# Patient Record
Sex: Female | Born: 1975 | Race: White | Hispanic: No
Health system: Southern US, Academic
[De-identification: ages and names within clinical notes are randomized; demographics above are authoritative.]

## PROBLEM LIST (undated history)

## (undated) ENCOUNTER — Ambulatory Visit: Payer: BLUE CROSS/BLUE SHIELD

## (undated) ENCOUNTER — Ambulatory Visit: Payer: Medicaid (Managed Care)

## (undated) ENCOUNTER — Encounter

## (undated) ENCOUNTER — Encounter
Attending: Student in an Organized Health Care Education/Training Program | Primary: Student in an Organized Health Care Education/Training Program

## (undated) ENCOUNTER — Encounter: Attending: Nurse Practitioner | Primary: Nurse Practitioner

## (undated) ENCOUNTER — Telehealth

## (undated) ENCOUNTER — Telehealth: Attending: Family | Primary: Family

## (undated) ENCOUNTER — Encounter
Attending: Rehabilitative and Restorative Service Providers" | Primary: Rehabilitative and Restorative Service Providers"

## (undated) ENCOUNTER — Ambulatory Visit

## (undated) ENCOUNTER — Ambulatory Visit: Payer: BLUE CROSS/BLUE SHIELD | Attending: Obstetrics & Gynecology | Primary: Obstetrics & Gynecology

## (undated) ENCOUNTER — Ambulatory Visit: Payer: BLUE CROSS/BLUE SHIELD | Attending: Physician Assistant | Primary: Physician Assistant

## (undated) ENCOUNTER — Other Ambulatory Visit

## (undated) ENCOUNTER — Inpatient Hospital Stay

## (undated) ENCOUNTER — Encounter
Payer: BLUE CROSS/BLUE SHIELD | Attending: Rehabilitative and Restorative Service Providers" | Primary: Rehabilitative and Restorative Service Providers"

## (undated) ENCOUNTER — Ambulatory Visit: Payer: BLUE CROSS/BLUE SHIELD | Attending: Nephrology | Primary: Nephrology

## (undated) ENCOUNTER — Non-Acute Institutional Stay: Payer: BLUE CROSS/BLUE SHIELD

## (undated) ENCOUNTER — Ambulatory Visit
Payer: Medicaid (Managed Care) | Attending: Rehabilitative and Restorative Service Providers" | Primary: Rehabilitative and Restorative Service Providers"

## (undated) ENCOUNTER — Telehealth
Attending: Student in an Organized Health Care Education/Training Program | Primary: Student in an Organized Health Care Education/Training Program

## (undated) ENCOUNTER — Ambulatory Visit: Attending: Nurse Practitioner | Primary: Nurse Practitioner

## (undated) ENCOUNTER — Ambulatory Visit: Payer: BLUE CROSS/BLUE SHIELD | Attending: Surgical | Primary: Surgical

## (undated) ENCOUNTER — Inpatient Hospital Stay: Payer: Medicaid (Managed Care)

## (undated) ENCOUNTER — Ambulatory Visit
Payer: BLUE CROSS/BLUE SHIELD | Attending: Student in an Organized Health Care Education/Training Program | Primary: Student in an Organized Health Care Education/Training Program

## (undated) ENCOUNTER — Ambulatory Visit: Payer: BLUE CROSS/BLUE SHIELD | Attending: Internal Medicine | Primary: Internal Medicine

## (undated) ENCOUNTER — Ambulatory Visit
Payer: Medicaid (Managed Care) | Attending: Student in an Organized Health Care Education/Training Program | Primary: Student in an Organized Health Care Education/Training Program

## (undated) ENCOUNTER — Ambulatory Visit
Payer: BLUE CROSS/BLUE SHIELD | Attending: Rehabilitative and Restorative Service Providers" | Primary: Rehabilitative and Restorative Service Providers"

## (undated) ENCOUNTER — Ambulatory Visit: Payer: BLUE CROSS/BLUE SHIELD | Attending: Clinical | Primary: Clinical

## (undated) ENCOUNTER — Telehealth: Attending: Nephrology | Primary: Nephrology

## (undated) ENCOUNTER — Encounter: Attending: Spinal Cord Injury Medicine | Primary: Spinal Cord Injury Medicine

## (undated) ENCOUNTER — Non-Acute Institutional Stay
Payer: BLUE CROSS/BLUE SHIELD | Attending: Rehabilitative and Restorative Service Providers" | Primary: Rehabilitative and Restorative Service Providers"

## (undated) ENCOUNTER — Ambulatory Visit: Payer: BLUE CROSS/BLUE SHIELD | Attending: Family | Primary: Family

## (undated) ENCOUNTER — Ambulatory Visit: Payer: Medicaid (Managed Care) | Attending: Ambulatory Care | Primary: Ambulatory Care

## (undated) ENCOUNTER — Ambulatory Visit: Payer: BLUE CROSS/BLUE SHIELD | Attending: Mental Health | Primary: Mental Health

## (undated) ENCOUNTER — Ambulatory Visit: Payer: BLUE CROSS/BLUE SHIELD | Attending: "Endocrinology | Primary: "Endocrinology

## (undated) DIAGNOSIS — M797 Fibromyalgia: Secondary | ICD-10-CM

## (undated) DIAGNOSIS — G629 Polyneuropathy, unspecified: Secondary | ICD-10-CM

## (undated) DIAGNOSIS — G4730 Sleep apnea, unspecified: Secondary | ICD-10-CM

## (undated) DIAGNOSIS — K2960 Other gastritis without bleeding: Secondary | ICD-10-CM

## (undated) DIAGNOSIS — E669 Obesity, unspecified: Secondary | ICD-10-CM

## (undated) DIAGNOSIS — E785 Hyperlipidemia, unspecified: Secondary | ICD-10-CM

## (undated) DIAGNOSIS — I10 Essential (primary) hypertension: Secondary | ICD-10-CM

## (undated) DIAGNOSIS — Q438 Other specified congenital malformations of intestine: Secondary | ICD-10-CM

## (undated) DIAGNOSIS — K2210 Ulcer of esophagus without bleeding: Secondary | ICD-10-CM

## (undated) DIAGNOSIS — F329 Major depressive disorder, single episode, unspecified: Secondary | ICD-10-CM

## (undated) DIAGNOSIS — K2270 Barrett's esophagus without dysplasia: Secondary | ICD-10-CM

## (undated) DIAGNOSIS — E119 Type 2 diabetes mellitus without complications: Secondary | ICD-10-CM

## (undated) DIAGNOSIS — L732 Hidradenitis suppurativa: Secondary | ICD-10-CM

## (undated) DIAGNOSIS — G2581 Restless legs syndrome: Secondary | ICD-10-CM

## (undated) DIAGNOSIS — K5792 Diverticulitis of intestine, part unspecified, without perforation or abscess without bleeding: Secondary | ICD-10-CM

## (undated) DIAGNOSIS — K319 Disease of stomach and duodenum, unspecified: Secondary | ICD-10-CM

## (undated) DIAGNOSIS — F32A Depression, unspecified: Secondary | ICD-10-CM

## (undated) HISTORY — DX: Hidradenitis suppurativa: L73.2

## (undated) HISTORY — PX: OTHER SURGICAL HISTORY: SHX169

## (undated) HISTORY — DX: Major depressive disorder, single episode, unspecified: F32.9

## (undated) HISTORY — DX: Ulcer of esophagus without bleeding: K22.10

## (undated) HISTORY — DX: Essential (primary) hypertension: I10

## (undated) HISTORY — PX: TONSILLECTOMY: SUR1361

## (undated) HISTORY — DX: Other gastritis without bleeding: K29.60

## (undated) HISTORY — DX: Other specified congenital malformations of intestine: Q43.8

## (undated) HISTORY — DX: Barrett's esophagus without dysplasia: K22.70

## (undated) HISTORY — DX: Obesity, unspecified: E66.9

## (undated) HISTORY — DX: Diverticulitis of intestine, part unspecified, without perforation or abscess without bleeding: K57.92

## (undated) HISTORY — DX: Depression, unspecified: F32.A

## (undated) HISTORY — DX: Disease of stomach and duodenum, unspecified: K31.9

## (undated) HISTORY — PX: COLONOSCOPY WITH ESOPHAGOGASTRODUODENOSCOPY (EGD): SHX5779

## (undated) HISTORY — DX: Polyneuropathy, unspecified: G62.9

## (undated) HISTORY — PX: COLONOSCOPY: SHX174

## (undated) HISTORY — DX: Hyperlipidemia, unspecified: E78.5

## (undated) HISTORY — DX: Type 2 diabetes mellitus without complications: E11.9

---

## 2012-12-02 ENCOUNTER — Emergency Department: Payer: Self-pay | Admitting: Emergency Medicine

## 2012-12-02 LAB — COMPREHENSIVE METABOLIC PANEL
Albumin: 3.6 g/dL (ref 3.4–5.0)
Anion Gap: 7 (ref 7–16)
Co2: 26 mmol/L (ref 21–32)
Creatinine: 1.33 mg/dL — ABNORMAL HIGH (ref 0.60–1.30)
EGFR (African American): 59 — ABNORMAL LOW
EGFR (Non-African Amer.): 51 — ABNORMAL LOW
Glucose: 275 mg/dL — ABNORMAL HIGH (ref 65–99)
Osmolality: 281 (ref 275–301)
SGPT (ALT): 55 U/L (ref 12–78)
Sodium: 133 mmol/L — ABNORMAL LOW (ref 136–145)

## 2012-12-02 LAB — CBC
HCT: 39.6 % (ref 35.0–47.0)
HGB: 14.1 g/dL (ref 12.0–16.0)
MCH: 31.4 pg (ref 26.0–34.0)
MCHC: 35.6 g/dL (ref 32.0–36.0)
MCV: 88 fL (ref 80–100)
Platelet: 310 10*3/uL (ref 150–440)
RBC: 4.5 10*6/uL (ref 3.80–5.20)
RDW: 13.5 % (ref 11.5–14.5)

## 2012-12-02 LAB — URINALYSIS, COMPLETE
Bacteria: NONE SEEN
Bilirubin,UR: NEGATIVE
Blood: NEGATIVE
Glucose,UR: >=500 mg/dL (ref 0–75)
Hyaline Cast: 3
Ph: 5 (ref 4.5–8.0)
Protein: NEGATIVE
RBC,UR: 4 /HPF (ref 0–5)
Squamous Epithelial: 4

## 2012-12-03 LAB — DRUG SCREEN, URINE
Amphetamines, Ur Screen: NEGATIVE (ref ?–1000)
Barbiturates, Ur Screen: NEGATIVE (ref ?–200)
Cocaine Metabolite,Ur ~~LOC~~: NEGATIVE (ref ?–300)
MDMA (Ecstasy)Ur Screen: NEGATIVE (ref ?–500)
Opiate, Ur Screen: NEGATIVE (ref ?–300)
Phencyclidine (PCP) Ur S: NEGATIVE (ref ?–25)
Tricyclic, Ur Screen: NEGATIVE (ref ?–1000)

## 2013-05-10 ENCOUNTER — Emergency Department: Payer: Self-pay | Admitting: Emergency Medicine

## 2014-06-03 ENCOUNTER — Ambulatory Visit: Payer: Self-pay | Admitting: Neurology

## 2014-06-29 ENCOUNTER — Ambulatory Visit: Payer: Self-pay | Admitting: Neurology

## 2014-08-05 ENCOUNTER — Emergency Department: Payer: Self-pay | Admitting: Emergency Medicine

## 2014-08-05 LAB — CBC WITH DIFFERENTIAL/PLATELET
BASOS ABS: 0.1 10*3/uL (ref 0.0–0.1)
Basophil %: 0.6 %
Eosinophil #: 0.6 10*3/uL (ref 0.0–0.7)
Eosinophil %: 7.1 %
HCT: 49.9 % — AB (ref 35.0–47.0)
HGB: 16.9 g/dL — ABNORMAL HIGH (ref 12.0–16.0)
Lymphocyte #: 1.9 10*3/uL (ref 1.0–3.6)
Lymphocyte %: 21.9 %
MCH: 30.3 pg (ref 26.0–34.0)
MCHC: 33.8 g/dL (ref 32.0–36.0)
MCV: 90 fL (ref 80–100)
MONO ABS: 0.7 x10 3/mm (ref 0.2–0.9)
Monocyte %: 7.5 %
NEUTROS ABS: 5.6 10*3/uL (ref 1.4–6.5)
Neutrophil %: 62.9 %
Platelet: 330 10*3/uL (ref 150–440)
RBC: 5.58 10*6/uL — ABNORMAL HIGH (ref 3.80–5.20)
RDW: 13.8 % (ref 11.5–14.5)
WBC: 8.9 10*3/uL (ref 3.6–11.0)

## 2014-08-05 LAB — LIPASE, BLOOD: Lipase: 180 U/L (ref 73–393)

## 2014-08-05 LAB — COMPREHENSIVE METABOLIC PANEL
ALBUMIN: 4 g/dL (ref 3.4–5.0)
AST: 14 U/L — AB (ref 15–37)
Alkaline Phosphatase: 76 U/L
Anion Gap: 10 (ref 7–16)
BUN: 19 mg/dL — AB (ref 7–18)
Bilirubin,Total: 0.4 mg/dL (ref 0.2–1.0)
CHLORIDE: 99 mmol/L (ref 98–107)
CREATININE: 0.93 mg/dL (ref 0.60–1.30)
Calcium, Total: 9 mg/dL (ref 8.5–10.1)
Co2: 26 mmol/L (ref 21–32)
EGFR (African American): >60
GLUCOSE: 261 mg/dL — AB (ref 65–99)
Osmolality: 281 (ref 275–301)
Potassium: 4.2 mmol/L (ref 3.5–5.1)
SGPT (ALT): 47 U/L
SODIUM: 135 mmol/L — AB (ref 136–145)
Total Protein: 8 g/dL (ref 6.4–8.2)

## 2014-08-05 LAB — TROPONIN I: Troponin-I: <0.02 ng/mL

## 2014-08-06 LAB — URINALYSIS, COMPLETE
BILIRUBIN, UR: NEGATIVE
BLOOD: NEGATIVE
Bacteria: NONE SEEN
Glucose,UR: >=500 mg/dL (ref 0–75)
Ketone: NEGATIVE
Leukocyte Esterase: NEGATIVE
Nitrite: NEGATIVE
Ph: 5 (ref 4.5–8.0)
Protein: NEGATIVE
RBC,UR: <1 /HPF (ref 0–5)
SPECIFIC GRAVITY: 1.04 (ref 1.003–1.030)
Squamous Epithelial: <1
WBC UR: 1 /HPF (ref 0–5)

## 2014-08-06 LAB — TROPONIN I

## 2014-08-18 ENCOUNTER — Ambulatory Visit: Payer: Self-pay | Admitting: Gastroenterology

## 2014-08-18 LAB — CLOSTRIDIUM DIFFICILE(ARMC)

## 2014-08-20 LAB — STOOL CULTURE

## 2014-08-25 ENCOUNTER — Ambulatory Visit: Payer: Self-pay | Admitting: Pain Medicine

## 2014-09-06 ENCOUNTER — Ambulatory Visit: Payer: Self-pay | Admitting: Gastroenterology

## 2014-09-14 ENCOUNTER — Ambulatory Visit: Payer: Self-pay | Admitting: Neurology

## 2014-10-05 ENCOUNTER — Ambulatory Visit
Admit: 2014-10-05 | Disposition: A | Discharge status: Home / Self Care | Payer: Self-pay | Attending: Neurology | Admitting: Neurology

## 2014-11-05 ENCOUNTER — Ambulatory Visit
Admit: 2014-11-05 | Disposition: A | Discharge status: Home / Self Care | Payer: Self-pay | Attending: Neurology | Admitting: Neurology

## 2014-11-29 LAB — SURGICAL PATHOLOGY

## 2015-01-05 ENCOUNTER — Telehealth: Payer: Self-pay | Admitting: Pain Medicine

## 2015-01-05 NOTE — Telephone Encounter (Signed)
Pt saw dr crisp in feb 2016, we referred to Dr [facility]as at [facility], pt saw Dr Azzie GlatterFras 01/05/15. Can we sch her for a follow up

## 2015-01-05 NOTE — Telephone Encounter (Signed)
Morrie SheldonAshley, I need to review the evaluation by Dr. Maryruth HancockAnn Marie Fras prior to scheduling patient for appointment area knees obtain the evaluation of Dr.Fras for me to review

## 2015-01-12 ENCOUNTER — Ambulatory Visit: Payer: 59 | Attending: Family Medicine | Admitting: Physical Therapy

## 2015-01-12 ENCOUNTER — Encounter: Payer: Self-pay | Admitting: Physical Therapy

## 2015-01-12 DIAGNOSIS — M6281 Muscle weakness (generalized): Secondary | ICD-10-CM | POA: Insufficient documentation

## 2015-01-12 DIAGNOSIS — R293 Abnormal posture: Secondary | ICD-10-CM | POA: Diagnosis not present

## 2015-01-12 DIAGNOSIS — R262 Difficulty in walking, not elsewhere classified: Secondary | ICD-10-CM

## 2015-01-12 DIAGNOSIS — M79671 Pain in right foot: Secondary | ICD-10-CM | POA: Insufficient documentation

## 2015-01-12 DIAGNOSIS — M25572 Pain in left ankle and joints of left foot: Secondary | ICD-10-CM | POA: Insufficient documentation

## 2015-01-12 DIAGNOSIS — M25562 Pain in left knee: Secondary | ICD-10-CM | POA: Diagnosis not present

## 2015-01-12 DIAGNOSIS — M25561 Pain in right knee: Secondary | ICD-10-CM | POA: Diagnosis not present

## 2015-01-12 DIAGNOSIS — M545 Low back pain, unspecified: Secondary | ICD-10-CM

## 2015-01-12 NOTE — Therapy (Signed)
[facility] [address] [place], [place], [address] Phone: [phone]   Fax:  [phone]  Physical Therapy Evaluation  Patient Details  Name: [name] MRN: [id number] Date of Birth: 06-07-1976 Referring Provider:  Sula Rumple, MD  Encounter Date: 01/12/2015      PT End of Session - 01/12/15 1313    Visit Number 1   Number of Visits 16   Date for PT Re-Evaluation 03/09/15   PT Start Time 1257   PT Stop Time 1401   PT Time Calculation (min) 64 min   Activity Tolerance Patient limited by pain   Behavior During Therapy [facility] for tasks assessed/performed      Past Medical History  Diagnosis Date  . Diabetes mellitus without complication   . Hyperlipidemia   . Obesity   . Hidradenitis suppurativa   . Hypertension   . Depression   . Neuropathy   . Diverticulitis   . Redundant colon   . Erosive esophagitis   . Bile-induced gastritis   . Gastropathy   . Barrett's esophagus     History reviewed. No pertinent past surgical history.  There were no vitals filed for this visit.  Visit Diagnosis:  Bilateral low back pain without sciatica  Pain in joint, ankle and foot, left  Pain in right foot  Knee pain, bilateral  Difficulty walking  Muscle weakness (generalized)  Abnormal posture      Subjective Assessment - 01/12/15 1306    Subjective Pt. reports 7.5/10 B foot pain, Pt. reports B knees ache (4/10).  Pt. c/o 6/10 back pain currently at rest in sitting and increase pain with standing/walking tasks.     How long can you sit comfortably? 30 minutes   How long can you stand comfortably? 5 min.   How long can you walk comfortably? <5 min.   Patient Stated Goals Increase mobility and decrease pain.     Currently in Pain? Yes   Pain Score 7    Pain Location Foot   Pain Orientation Left;Right   Multiple Pain Sites Yes   Pain Score 6   Pain Location Back   Pain Orientation Lower   Pain Type Chronic  pain   Pain Relieving Factors rest       LEFS: 15 out of 80  Modified Oswestry: 82% self-perceived bed bound  Pt. Ambulates with slow, antalgic gait pattern with lateral swaying and forward head/ rounded shoulder posture.  Pt. Reports numerous falls at home due to neuropathy/ LE pain.           PT Education - 01/13/15 0748    Education provided Yes   Education Details HEP/ discuss aquatic ex. and [employer]   Person(s) Educated Patient;Caregiver(s)   Methods Explanation;Demonstration;Handout   Comprehension Verbalized understanding;Returned demonstration;Verbal cues required             PT Long Term Goals - 01/13/15 1227    PT LONG TERM GOAL #1   Title Pt. I with gym based/ HEP to improve B UE/LE muscle strength to grossly 4+/5 MMT to improve overall mobility.     Baseline Generalized strength 4/5 MMT   Time 8   Period Weeks   Status New   PT LONG TERM GOAL #2   Title Pt. will increase LEFS to >40 out of 80 to improve pain-free mobility with ADL and daily tasks.     Baseline LEFS: 15 out of 80 on 01/12/15   PT LONG TERM  GOAL #3   Title Pt. will decrease Modified Oswestry to <60% to improve self-perceived disability.     Baseline 82% self-perceived bed bound on 01/12/15   Time 8   Period Weeks   Status New   PT LONG TERM GOAL #4   Title Pt. able to complete 30 minutes of aquatic exercise with no increase c/o pain to improve generalized strength/ endurance with daily tasks.    Time 8   Period Weeks   Status New   PT LONG TERM GOAL #5   Title Pt. will report <5/10 back/B LE pain at worst with daily ADL and walking around house.     Time 8   Period Weeks   Status New               Plan - 01/13/15 0749    Clinical Impression Statement Pt. is a 39 y/o female while chronic h/o fibromyalgia, back and LE pain/ neuropathy.  Pt. c/o >6/10 constant back/LE pain which worsens with increase activity/ position changes.  Pt. is morbidly obese at >350# but has lost  over 150# over past couple years.  B UE/LE AROM WFL and strength grossly 4/5 MMT.  Decrease sensation in B lower legs/ feet with light palpaiton.  Pain limited standing lumbar flexion/ extension (50% limited).  Significant tenderness with very gentle/ light palpaiton over B UT/ mid-thoracic and lumbar paraspinals.  Pt. Ambulates with slow, antalgic gait pattern with lateral swaying and forward head/ rounded shoulder posture.  Pt. Reports numerous falls at home due to neuropathy/ LE pain.  Pt. recently joined [employer] but has not attended yet and will benefit from skilled PT services for generalized strengthening/ conditioning with addition of aquatic ex. program.      Pt will benefit from skilled therapeutic intervention in order to improve on the following deficits Decreased endurance;Decreased skin integrity;Impaired sensation;Increased edema;Cardiopulmonary status limiting activity;Decreased activity tolerance;Decreased strength;Pain;Decreased balance;Decreased mobility;Difficulty walking;Improper body mechanics;Impaired perceived functional ability;Dizziness;Decreased range of motion;Decreased coordination;Decreased safety awareness;Impaired flexibility;Postural dysfunction   Rehab Potential Fair   PT Frequency 2x / week   PT Duration 8 weeks   PT Treatment/Interventions ADLs/Self Care Home Management;Neuromuscular re-education;Aquatic Therapy;Patient/family education;Gait training;Cryotherapy;Stair training;Functional mobility training;Manual techniques;Balance training;Therapeutic exercise;Therapeutic activities;Moist Heat   PT Next Visit Plan Issue land based/ aquatic ex. program.  Review and progress HEP.     PT Home Exercise Plan see handouts/  Pt. will participate with [employer]   Consulted and Agree with Plan of Care Patient         Problem List There are no active problems to display for this patient.  Cammie McgeeMichael C Develle Sievers, PT, DPT # [phone]   01/13/2015, 12:35 PM  [facility] [address] [place], [place], [id number] Phone: [phone]   Fax:  [phone]

## 2015-01-18 ENCOUNTER — Ambulatory Visit: Payer: 59 | Admitting: Physical Therapy

## 2015-01-18 DIAGNOSIS — M25572 Pain in left ankle and joints of left foot: Secondary | ICD-10-CM

## 2015-01-18 DIAGNOSIS — M545 Low back pain, unspecified: Secondary | ICD-10-CM

## 2015-01-18 DIAGNOSIS — R262 Difficulty in walking, not elsewhere classified: Secondary | ICD-10-CM

## 2015-01-18 DIAGNOSIS — R293 Abnormal posture: Secondary | ICD-10-CM

## 2015-01-18 DIAGNOSIS — M79671 Pain in right foot: Secondary | ICD-10-CM

## 2015-01-18 DIAGNOSIS — M25561 Pain in right knee: Secondary | ICD-10-CM

## 2015-01-18 DIAGNOSIS — M6281 Muscle weakness (generalized): Secondary | ICD-10-CM

## 2015-01-18 DIAGNOSIS — M25562 Pain in left knee: Secondary | ICD-10-CM

## 2015-01-19 NOTE — Therapy (Signed)
[facility] [address] [place], [place], [address] Phone: [phone]   Fax:  [phone]  Physical Therapy Treatment  Patient Details  Name: [name] MRN: [id number] Date of Birth: 02-19-1976 Referring Provider:  Sula Rumple, MD  Encounter Date: 01/18/2015      PT End of Session - 01/19/15 0749    Visit Number 2   Number of Visits 16   Date for PT Re-Evaluation 03/09/15   PT Start Time 1501   PT Stop Time 1555   PT Time Calculation (min) 54 min   Activity Tolerance Patient limited by pain;Patient limited by fatigue   Behavior During Therapy [facility] for tasks assessed/performed      Past Medical History  Diagnosis Date  . Diabetes mellitus without complication   . Hyperlipidemia   . Obesity   . Hidradenitis suppurativa   . Hypertension   . Depression   . Neuropathy   . Diverticulitis   . Redundant colon   . Erosive esophagitis   . Bile-induced gastritis   . Gastropathy   . Barrett's esophagus     No past surgical history on file.  There were no vitals filed for this visit.  Visit Diagnosis:  Bilateral low back pain without sciatica  Pain in joint, ankle and foot, left  Pain in right foot  Knee pain, bilateral  Difficulty walking  Muscle weakness (generalized)  Abnormal posture      Subjective Assessment - 01/19/15 0747    Subjective Pt. c/o 7-8/10 L ankle/ back pain currently at rest.  Pt. states L ankle feels swollen today and had difficulty putting shoe on.  Pt. reports being unable to feel B feet.  Pt. has attended [employer] 2x over past week to ride recumbent bike.     Patient is accompained by: Family member   How long can you sit comfortably? 30 minutes   How long can you stand comfortably? 5 min.   How long can you walk comfortably? <5 min.   Patient Stated Goals Increase mobility and decrease pain.     Currently in Pain? Yes   Pain Score 7    Pain Location Foot   Pain  Orientation Left   Pain Type Chronic pain   Multiple Pain Sites Yes   Pain Score 8   Pain Location Back   Pain Orientation Lower   Pain Type Chronic pain       OBJECTIVE:  There.ex.: Nustep L4 7 min. (warm-up/no charge)- B UE/LE (increase c/o L foot pain).  Standing posture correction: RTB scap. Retraction/ horizontal shoulder flexion 20x each with mirror feedback.  Standing hip flexion/ abd./ heel raises with no wt. 20x each (mirror feedback/ posture correction).  Standing partial lunges on 6" step in //-bars (light UE assist) 10x L/R.  Resisted gait 2BTB lateral walking 5x each (moderate fatigue).  Sit to stands 10x with min. To no UE assist and cuing for proper technique.  Reviewed HEP.      Pt response for medical necessity:  Moderate cuing for proper technique.  Pt. Easily fatigued and pain focused with standing resisted ex.            PT Long Term Goals - 01/13/15 1227    PT LONG TERM GOAL #1   Title Pt. I with gym based/ HEP to improve B UE/LE muscle strength to grossly 4+/5 MMT to improve overall mobility.     Baseline Generalized strength 4/5 MMT   Time  8   Period Weeks   Status New   PT LONG TERM GOAL #2   Title Pt. will increase LEFS to >40 out of 80 to improve pain-free mobility with ADL and daily tasks.     Baseline LEFS: 15 out of 80 on 01/12/15   PT LONG TERM GOAL #3   Title Pt. will decrease Modified Oswestry to <60% to improve self-perceived disability.     Baseline 82% self-perceived bed bound on 01/12/15   Time 8   Period Weeks   Status New   PT LONG TERM GOAL #4   Title Pt. able to complete 30 minutes of aquatic exercise with no increase c/o pain to improve generalized strength/ endurance with daily tasks.    Time 8   Period Weeks   Status New   PT LONG TERM GOAL #5   Title Pt. will report <5/10 back/B LE pain at worst with daily ADL and walking around house.     Time 8   Period Weeks   Status New               Plan - 01/19/15 0753     Clinical Impression Statement Pt. requires several short seated rest breaks and frequent cuing/ guidance during ther.ex. for proper technique and encouragement.  Pt. easily fatigued with standing tasks and pain focused with light resistance posture correction.    Pt will benefit from skilled therapeutic intervention in order to improve on the following deficits Decreased endurance;Decreased skin integrity;Impaired sensation;Increased edema;Cardiopulmonary status limiting activity;Decreased activity tolerance;Decreased strength;Pain;Decreased balance;Decreased mobility;Difficulty walking;Improper body mechanics;Impaired perceived functional ability;Dizziness;Decreased range of motion;Decreased coordination;Decreased safety awareness;Impaired flexibility;Postural dysfunction   Rehab Potential Fair   PT Frequency 2x / week   PT Duration 8 weeks   PT Treatment/Interventions ADLs/Self Care Home Management;Neuromuscular re-education;Aquatic Therapy;Patient/family education;Gait training;Cryotherapy;Stair training;Functional mobility training;Manual techniques;Balance training;Therapeutic exercise;Therapeutic activities;Moist Heat   PT Next Visit Plan Issue land based/ aquatic ex. program.  Review and progress HEP.     PT Home Exercise Plan see handouts/  Pt. will participate with [employer]   Consulted and Agree with Plan of Care Patient;Family member/caregiver   Family Member Consulted husband        Problem List There are no active problems to display for this patient.   Cammie Mcgee, PT, DPT # [phone]   01/19/2015, 7:55 AM  [facility] [address] [place], [place], [address] Phone: [phone]   Fax:  [phone]

## 2015-01-20 ENCOUNTER — Ambulatory Visit: Payer: 59 | Admitting: Physical Therapy

## 2015-01-20 DIAGNOSIS — M25561 Pain in right knee: Secondary | ICD-10-CM

## 2015-01-20 DIAGNOSIS — M25572 Pain in left ankle and joints of left foot: Secondary | ICD-10-CM

## 2015-01-20 DIAGNOSIS — M545 Low back pain, unspecified: Secondary | ICD-10-CM

## 2015-01-20 DIAGNOSIS — M6281 Muscle weakness (generalized): Secondary | ICD-10-CM

## 2015-01-20 DIAGNOSIS — M79671 Pain in right foot: Secondary | ICD-10-CM

## 2015-01-20 DIAGNOSIS — M25562 Pain in left knee: Secondary | ICD-10-CM

## 2015-01-20 DIAGNOSIS — R293 Abnormal posture: Secondary | ICD-10-CM

## 2015-01-20 DIAGNOSIS — R262 Difficulty in walking, not elsewhere classified: Secondary | ICD-10-CM

## 2015-01-21 NOTE — Therapy (Signed)
[place] [facility] [address] [place], [place], [address] Phone: [phone]   Fax:  [phone]  Physical Therapy Treatment  Patient Details  Name: [name] MRN: [id number] Date of Birth: 02/08/1976 Referring Provider:  Sula Rumple, MD  Encounter Date: 01/20/2015      PT End of Session - 01/21/15 1011    Visit Number 3   Number of Visits 16   Date for PT Re-Evaluation 03/09/15   PT Start Time 1524   PT Stop Time 1612   PT Time Calculation (min) 48 min   Activity Tolerance Patient limited by pain;Patient limited by fatigue   Behavior During Therapy [facility] for tasks assessed/performed      Past Medical History  Diagnosis Date  . Diabetes mellitus without complication   . Hyperlipidemia   . Obesity   . Hidradenitis suppurativa   . Hypertension   . Depression   . Neuropathy   . Diverticulitis   . Redundant colon   . Erosive esophagitis   . Bile-induced gastritis   . Gastropathy   . Barrett's esophagus     No past surgical history on file.  There were no vitals filed for this visit.  Visit Diagnosis:  Bilateral low back pain without sciatica  Pain in joint, ankle and foot, left  Pain in right foot  Knee pain, bilateral  Difficulty walking  Abnormal posture  Muscle weakness (generalized)      Subjective Assessment - 01/21/15 1006    Subjective Pt. states she is having a really difficult day.  Pt. c/o significant pain in back/B LE.  Pt. states she didn't do anything yesterday or today due to pain.     Patient is accompained by: Family member   Patient Stated Goals Increase mobility and decrease pain.     Currently in Pain? Yes   Pain Score 8    Pain Location Back   Pain Score 8   Pain Location Leg   Pain Orientation Right;Left   Pain Type Chronic pain      OBJECTIVE: There.ex.: Nustep L4 13 min. (warm-up/no charge)- B UE/LE (consistent cadence). Standing posture correction: RTB scap.  Retraction/ ER/ partial lunges 10x2 each with mirror feedback (see new HEP handouts). Sit to stands 10x with min. To no UE assist and cuing for proper technique. Reviewed HEP.    Pt response for medical necessity: Moderate cuing for proper technique. Pt. Easily fatigued and pain focused/limited with all standing resisted ex.           PT Education - 01/21/15 0603    Education provided Yes   Education Details HEP   Person(s) Educated Patient;Spouse   Methods Explanation;Demonstration;Handout   Comprehension Verbalized understanding;Returned demonstration;Verbal cues required             PT Long Term Goals - 01/13/15 1227    PT LONG TERM GOAL #1   Title Pt. I with gym based/ HEP to improve B UE/LE muscle strength to grossly 4+/5 MMT to improve overall mobility.     Baseline Generalized strength 4/5 MMT   Time 8   Period Weeks   Status New   PT LONG TERM GOAL #2   Title Pt. will increase LEFS to >40 out of 80 to improve pain-free mobility with ADL and daily tasks.     Baseline LEFS: 15 out of 80 on 01/12/15   PT LONG TERM GOAL #3   Title Pt. will decrease Modified Oswestry to <60% to  improve self-perceived disability.     Baseline 82% self-perceived bed bound on 01/12/15   Time 8   Period Weeks   Status New   PT LONG TERM GOAL #4   Title Pt. able to complete 30 minutes of aquatic exercise with no increase c/o pain to improve generalized strength/ endurance with daily tasks.    Time 8   Period Weeks   Status New   PT LONG TERM GOAL #5   Title Pt. will report <5/10 back/B LE pain at worst with daily ADL and walking around house.     Time 8   Period Weeks   Status New               Plan - 01/21/15 1013    Clinical Impression Statement Tx. limited by high levels of pain and poor tolerance to any ther.ex.  Pt. required several long rest breaks and PT provided pt. with more education.     Rehab Potential Fair   PT Frequency 2x / week   PT Duration  8 weeks   PT Treatment/Interventions ADLs/Self Care Home Management;Neuromuscular re-education;Aquatic Therapy;Patient/family education;Gait training;Cryotherapy;Stair training;Functional mobility training;Manual techniques;Balance training;Therapeutic exercise;Therapeutic activities;Moist Heat   PT Next Visit Plan Issue land based/ aquatic ex. program.  Review and progress HEP.     PT Home Exercise Plan see handouts/  Pt. will participate with [employer]   Consulted and Agree with Plan of Care Patient;Family member/caregiver   Family Member Consulted husband        Problem List There are no active problems to display for this patient.   Cammie Mcgee, PT, DPT # [phone]   01/21/2015, 10:17 AM  [facility] [address] [place], [place], [address] Phone: [phone]   Fax:  [phone]

## 2015-01-25 ENCOUNTER — Encounter: Payer: 59 | Admitting: Physical Therapy

## 2015-01-27 ENCOUNTER — Ambulatory Visit: Payer: 59 | Admitting: Physical Therapy

## 2015-02-01 ENCOUNTER — Ambulatory Visit: Payer: 59 | Admitting: Physical Therapy

## 2015-02-01 ENCOUNTER — Encounter: Payer: 59 | Admitting: Physical Therapy

## 2015-02-03 ENCOUNTER — Ambulatory Visit: Payer: 59 | Admitting: Physical Therapy

## 2015-02-08 ENCOUNTER — Encounter: Payer: 59 | Admitting: Physical Therapy

## 2015-02-08 ENCOUNTER — Ambulatory Visit: Payer: 59 | Admitting: Physical Therapy

## 2015-02-10 ENCOUNTER — Ambulatory Visit: Payer: 59 | Admitting: Physical Therapy

## 2015-02-14 ENCOUNTER — Encounter: Payer: 59 | Admitting: Physical Therapy

## 2015-02-15 ENCOUNTER — Ambulatory Visit: Payer: 59 | Admitting: Physical Therapy

## 2015-02-17 ENCOUNTER — Encounter: Payer: 59 | Admitting: Physical Therapy

## 2015-02-28 ENCOUNTER — Encounter: Payer: 59 | Admitting: Physical Therapy

## 2015-06-28 ENCOUNTER — Other Ambulatory Visit: Payer: Self-pay | Admitting: Obstetrics and Gynecology

## 2015-06-28 DIAGNOSIS — N63 Unspecified lump in unspecified breast: Secondary | ICD-10-CM

## 2015-07-15 ENCOUNTER — Ambulatory Visit
Admission: RE | Admit: 2015-07-15 | Discharge: 2015-07-15 | Disposition: A | Discharge status: Home / Self Care | Payer: 59 | Source: Ambulatory Visit | Attending: Obstetrics and Gynecology | Admitting: Obstetrics and Gynecology

## 2015-07-15 ENCOUNTER — Other Ambulatory Visit: Payer: Self-pay | Admitting: Obstetrics and Gynecology

## 2015-07-15 DIAGNOSIS — N63 Unspecified lump in unspecified breast: Secondary | ICD-10-CM

## 2015-07-15 DIAGNOSIS — R928 Other abnormal and inconclusive findings on diagnostic imaging of breast: Secondary | ICD-10-CM | POA: Insufficient documentation

## 2015-07-18 ENCOUNTER — Telehealth: Payer: Self-pay

## 2015-07-18 NOTE — Telephone Encounter (Signed)
The doctor has a question about this patient that you saw in February. Please call her first thing in the morning.

## 2015-07-19 ENCOUNTER — Other Ambulatory Visit: Payer: Self-pay | Admitting: Pain Medicine

## 2015-07-19 NOTE — Telephone Encounter (Signed)
Spoke with Dr Ardith DarkBock's nurse and relayed that Mrs [name] has not been seen at our clinic, she was referred to [facility], Dr Azzie GlatterFras for evaluation.  No further activity with this patient.

## 2015-07-19 NOTE — Telephone Encounter (Signed)
Chip BoerVicki Please have nurses called this patient's physician. to see what I can do to assist

## 2015-08-08 ENCOUNTER — Emergency Department: Payer: BLUE CROSS/BLUE SHIELD

## 2015-08-08 ENCOUNTER — Emergency Department
Admission: EM | Admit: 2015-08-08 | Discharge: 2015-08-09 | Disposition: A | Discharge status: Home / Self Care | Payer: BLUE CROSS/BLUE SHIELD | Attending: Emergency Medicine | Admitting: Emergency Medicine

## 2015-08-08 ENCOUNTER — Encounter: Payer: Self-pay | Admitting: Emergency Medicine

## 2015-08-08 DIAGNOSIS — Z87891 Personal history of nicotine dependence: Secondary | ICD-10-CM | POA: Diagnosis not present

## 2015-08-08 DIAGNOSIS — Z7984 Long term (current) use of oral hypoglycemic drugs: Secondary | ICD-10-CM | POA: Diagnosis not present

## 2015-08-08 DIAGNOSIS — Z792 Long term (current) use of antibiotics: Secondary | ICD-10-CM | POA: Insufficient documentation

## 2015-08-08 DIAGNOSIS — R0602 Shortness of breath: Secondary | ICD-10-CM | POA: Insufficient documentation

## 2015-08-08 DIAGNOSIS — E119 Type 2 diabetes mellitus without complications: Secondary | ICD-10-CM | POA: Diagnosis not present

## 2015-08-08 DIAGNOSIS — I10 Essential (primary) hypertension: Secondary | ICD-10-CM | POA: Insufficient documentation

## 2015-08-08 DIAGNOSIS — Z7982 Long term (current) use of aspirin: Secondary | ICD-10-CM | POA: Diagnosis not present

## 2015-08-08 DIAGNOSIS — R000 Tachycardia, unspecified: Secondary | ICD-10-CM | POA: Diagnosis not present

## 2015-08-08 DIAGNOSIS — R079 Chest pain, unspecified: Secondary | ICD-10-CM | POA: Diagnosis present

## 2015-08-08 LAB — HEPATIC FUNCTION PANEL
ALT: 42 U/L (ref 14–54)
AST: 28 U/L (ref 15–41)
Albumin: 3.7 g/dL (ref 3.5–5.0)
Alkaline Phosphatase: 80 U/L (ref 38–126)
BILIRUBIN TOTAL: 0.3 mg/dL (ref 0.3–1.2)
TOTAL PROTEIN: 7.2 g/dL (ref 6.5–8.1)

## 2015-08-08 LAB — CBC
HCT: 39.3 % (ref 35.0–47.0)
HEMOGLOBIN: 12.9 g/dL (ref 12.0–16.0)
MCH: 26.9 pg (ref 26.0–34.0)
MCHC: 32.9 g/dL (ref 32.0–36.0)
MCV: 81.8 fL (ref 80.0–100.0)
PLATELETS: 322 10*3/uL (ref 150–440)
RBC: 4.8 MIL/uL (ref 3.80–5.20)
RDW: 15 % — ABNORMAL HIGH (ref 11.5–14.5)
WBC: 7.2 10*3/uL (ref 3.6–11.0)

## 2015-08-08 LAB — BASIC METABOLIC PANEL
ANION GAP: 9 (ref 5–15)
BUN: 18 mg/dL (ref 6–20)
CALCIUM: 9 mg/dL (ref 8.9–10.3)
CO2: 28 mmol/L (ref 22–32)
CREATININE: 0.71 mg/dL (ref 0.44–1.00)
Chloride: 103 mmol/L (ref 101–111)
GFR calc non Af Amer: >60 mL/min (ref >60)
Glucose, Bld: 197 mg/dL — ABNORMAL HIGH (ref 65–99)
Potassium: 4.3 mmol/L (ref 3.5–5.1)
SODIUM: 140 mmol/L (ref 135–145)

## 2015-08-08 LAB — TROPONIN I

## 2015-08-08 LAB — FIBRIN DERIVATIVES D-DIMER (ARMC ONLY): FIBRIN DERIVATIVES D-DIMER (ARMC): 505 — AB (ref 0–499)

## 2015-08-08 MED ORDER — ONDANSETRON 4 MG PO TBDP
ORAL_TABLET | ORAL | Status: AC
  Administered 2015-08-08: 4 mg via ORAL
  Filled 2015-08-08: qty 1

## 2015-08-08 MED ORDER — ONDANSETRON 4 MG PO TBDP
4.0000 mg | ORAL_TABLET | Freq: Once | ORAL | Status: AC
  Administered 2015-08-08: 4 mg via ORAL

## 2015-08-08 NOTE — ED Notes (Addendum)
Pt is tearful upon this RN entering room. Pt states she is overwhelmed. Pt was provided tissues. Pt was informed about the scan ordered, what she is waiting for. Pt stated understanding.

## 2015-08-08 NOTE — ED Notes (Signed)
Pt stated she was lightheaded and nauseous. Dr. Derrill KayGoodman notified. See MAR for orders.

## 2015-08-08 NOTE — ED Notes (Signed)
Pt presents to Ed with abd pain " it is swelling and pitting" with stabbing intermittent chest pain. Abd swelling started last night and chest pain with SOB this morning.

## 2015-08-08 NOTE — ED Provider Notes (Signed)
[facility] Emergency Department Provider Note    ____________________________________________  Time seen: 1825  I have reviewed the triage vital signs and the nursing notes.   HISTORY  Chief Complaint Chest Pain; Shortness of Breath; and Abdominal Pain   History limited by: Not Limited   HPI Cole Klugh is a 40 y.o. female with multiple medical problems who presents to the emergency department today because of concerns for chest pain. She describes it as a stabbing type pain. This been constant since this morning. She states she has had similar pain however very intermittently. Today he has been cause. She has had some associated worsening shortness of breath. No significant cough. The pain does not radiate. She has not found anything that makes it better. She denies any fevers. In addition she has no some abdominal swelling.     Past Medical History  Diagnosis Date  . Diabetes mellitus without complication (HCC)   . Hyperlipidemia   . Obesity   . Hidradenitis suppurativa   . Hypertension   . Depression   . Neuropathy (HCC)   . Diverticulitis   . Redundant colon   . Erosive esophagitis   . Bile-induced gastritis   . Gastropathy   . Barrett's esophagus     There are no active problems to display for this patient.   History reviewed. No pertinent past surgical history.  Current Outpatient Rx  Name  Route  Sig  Dispense  Refill  . ascorbic Acid (VITAMIN C) 500 MG CPCR   Oral   Take 500 mg by mouth daily.         Marland Kitchen aspirin EC 81 MG tablet   Oral   Take 81 mg by mouth daily.         . DULoxetine (CYMBALTA) 30 MG capsule   Oral   Take 30 mg by mouth daily.         . ergocalciferol (VITAMIN D2) 50000 UNITS capsule   Oral   Take 50,000 Units by mouth once a week.         . fluconazole (DIFLUCAN) 200 MG tablet   Oral   Take 200 mg by mouth daily.         . furosemide (LASIX) 20 MG tablet   Oral   Take 20 mg by  mouth.         . gabapentin (NEURONTIN) 300 MG capsule   Oral   Take 300 mg by mouth 3 (three) times daily.         Marland Kitchen glucose blood test strip   Other   1 each by Other route as needed for other. Use as instructed         . lisinopril (PRINIVIL,ZESTRIL) 10 MG tablet   Oral   Take 10 mg by mouth daily.         Marland Kitchen LORazepam (ATIVAN) 0.5 MG tablet   Oral   Take 0.5 mg by mouth every 8 (eight) hours.         . metFORMIN (GLUCOPHAGE) 500 MG tablet   Oral   Take by mouth 2 (two) times daily with a meal.         . omeprazole (PRILOSEC) 20 MG capsule   Oral   Take 20 mg by mouth daily.           Allergies Oxycodone  Family History  Problem Relation Age of Onset  . Adopted: Yes  . Colon cancer Father     Social  History Social History  Substance Use Topics  . Smoking status: Former Smoker    Quit date: 01/10/2012  . Smokeless tobacco: None  . Alcohol Use: No    Review of Systems  Constitutional: Negative for fever. Cardiovascular: Positive for chest pain. Respiratory: Positive for shortness of breath. Gastrointestinal: Negative for abdominal pain, vomiting and diarrhea. Neurological: Negative for headaches, focal weakness or numbness.   10-point ROS otherwise negative.  ____________________________________________   PHYSICAL EXAM:  VITAL SIGNS: ED Triage Vitals  Enc Vitals Group     BP 08/08/15 1657 127/72 mmHg     Pulse Rate 08/08/15 1657 104     Resp 08/08/15 1657 18     Temp 08/08/15 1657 98.1 F (36.7 C)     Temp Source 08/08/15 1657 Oral     SpO2 08/08/15 1657 94 %     Weight 08/08/15 1657 397 lb (180.078 kg)     Height 08/08/15 1657 5\' 6"  (1.676 m)     Head Cir --      Peak Flow --      Pain Score 08/08/15 1659 8   Constitutional: Alert and oriented. Well appearing and in no distress. Eyes: Conjunctivae are normal. PERRL. Normal extraocular movements. ENT   Head: Normocephalic and atraumatic.   Nose: No  congestion/rhinnorhea.   Mouth/Throat: Mucous membranes are moist.   Neck: No stridor. Hematological/Lymphatic/Immunilogical: No cervical lymphadenopathy. Cardiovascular: Tachycardia, regular rhythm.  No murmurs, rubs, or gallops appreciated, however exam is limited by body habitus. Respiratory: Normal respiratory effort without tachypnea nor retractions. No concerning breath sounds however exam is limited by body habitus. Gastrointestinal: Soft and nontender. No distention.  Genitourinary: Deferred Musculoskeletal: Normal range of motion in all extremities. No joint effusions.  No lower extremity tenderness nor edema. Neurologic:  Normal speech and language. No gross focal neurologic deficits are appreciated.  Skin:  Skin is warm, dry and intact. No rash noted. Psychiatric: Mood and affect are normal. Speech and behavior are normal. Patient exhibits appropriate insight and judgment.  ____________________________________________    LABS (pertinent positives/negatives)  Labs Reviewed  BASIC METABOLIC PANEL - Abnormal; Notable for the following:    Glucose, Bld 197 (*)    All other components within normal limits  CBC - Abnormal; Notable for the following:    RDW 15.0 (*)    All other components within normal limits  FIBRIN DERIVATIVES D-DIMER ([facility] ONLY) - Abnormal; Notable for the following:    Fibrin derivatives D-dimer ([facility]) 505 (*)    All other components within normal limits  HEPATIC FUNCTION PANEL - Abnormal; Notable for the following:    Bilirubin, Direct <0.1 (*)    All other components within normal limits  TROPONIN I     ____________________________________________   EKG  I, Phineas Semen, attending physician, personally viewed and interpreted this EKG  EKG Time: 1647 Rate: 111 Rhythm:  sinus tachycardia Axis: left axis deviation Intervals: qtc 459 QRS: narrow ST changes: no st elevation Impression: sinus  tachycardia   ____________________________________________    RADIOLOGY  CXR  IMPRESSION: Hypoventilatory chest without acute process.  ____________________________________________   PROCEDURES  Procedure(s) performed: None  Critical Care performed: No  ____________________________________________   INITIAL IMPRESSION / ASSESSMENT AND PLAN / ED COURSE  Pertinent labs & imaging results that were available during my care of the patient were reviewed by me and considered in my medical decision making (see chart for details).  Patient presented to the emergency department today because of concerns for shortness of  breath and chest pain. She described as sharp in nature. Patient does have history of rheumatologic disease and was tachycardic thus I did have concern for pulmonary embolism. Patient unable to have CT here given weight limitations of the table. Have arranged patient to get a VQ scan. This will be signed out to oncoming provider.  ____________________________________________   FINAL CLINICAL IMPRESSION(S) / ED DIAGNOSES  Final diagnoses:  SOB (shortness of breath)  Chest pain, unspecified chest pain type     Phineas SemenGraydon Genevieve Arbaugh, MD 08/09/15 1307

## 2015-08-08 NOTE — ED Notes (Signed)
Report given to Katharine, RN

## 2015-08-08 NOTE — ED Notes (Signed)
Pt stated her LMP ended a week ago, lasted a month long because her periods are irregular.

## 2015-08-09 MED ORDER — TECHNETIUM TC 99M DIETHYLENETRIAME-PENTAACETIC ACID
40.0000 | Freq: Once | INTRAVENOUS | Status: AC | PRN
  Administered 2015-08-09: 43.26 via RESPIRATORY_TRACT

## 2015-08-09 MED ORDER — TECHNETIUM TO 99M ALBUMIN AGGREGATED
5.0000 | Freq: Once | INTRAVENOUS | Status: AC | PRN
  Administered 2015-08-09: 4.98 via INTRAVENOUS

## 2015-08-09 MED ORDER — SODIUM CHLORIDE 0.9 % IV BOLUS (SEPSIS)
1000.0000 mL | Freq: Once | INTRAVENOUS | Status: AC
  Administered 2015-08-09: 1000 mL via INTRAVENOUS

## 2015-08-09 MED ORDER — KETOROLAC TROMETHAMINE 30 MG/ML IJ SOLN
30.0000 mg | Freq: Once | INTRAMUSCULAR | Status: AC
  Administered 2015-08-09: 30 mg via INTRAVENOUS
  Filled 2015-08-09: qty 1

## 2015-08-09 MED ORDER — GI COCKTAIL ~~LOC~~
30.0000 mL | Freq: Once | ORAL | Status: AC
  Administered 2015-08-09: 30 mL via ORAL
  Filled 2015-08-09: qty 30

## 2015-08-09 MED ORDER — TRAMADOL HCL 50 MG PO TABS: 50.0000 mg | ORAL_TABLET | Freq: Four times a day (QID) | ORAL | Status: AC | PRN

## 2015-08-09 MED ORDER — TRAMADOL HCL 50 MG PO TABS
50.0000 mg | ORAL_TABLET | Freq: Once | ORAL | Status: AC
  Administered 2015-08-09: 50 mg via ORAL
  Filled 2015-08-09: qty 1

## 2015-08-09 NOTE — ED Provider Notes (Signed)
-----------------------------------------   1:41 AM on 08/09/2015 -----------------------------------------   Blood pressure 118/60, pulse 105, temperature 98.1 F (36.7 C), temperature source Oral, resp. rate 18, height 5\' 6"  (1.676 m), weight 397 lb (180.078 kg), last menstrual period 08/02/2015, SpO2 95 %.  Assuming care from Dr. Huel CoteQuigley who received sign out from Dr Derrill KayGoodman.  In short, [name] is a 40 y.o. female with a chief complaint of Chest Pain; Shortness of Breath; and Abdominal Pain .  Refer to the original H&P for additional details.  The current plan of care is to follow up the results of the VQ scan and disposition the patient.  VQ scan: Low probability for pulmonary Embolus. Decreased ventilation and perfusion involving the left lung which may reflect underlying chronic lung changes.  The patient had some mild tachycardia while she was here. I did write for her to receive some fluid as well as some tramadol and a shot of Toradol. I did go in to speak with the patient and she was very anxious about the cause of her symptoms. I informed her that she did not have a pulmonary embolus nor did she appear to have any acute coronary syndrome at this time. I did explain to her though that she needed to follow back up with her doctor for further stress testing and other testing that he may deem appropriate. The patient also received a GI cocktail and was having some improvement in her pain symptoms. The patient reports that she will contact her doctor's office first thing this morning to arrange follow-up.   Rebecka ApleyAllison P Elton Heid, MD 08/09/15 0400

## 2015-08-09 NOTE — ED Notes (Signed)
Pt transported to scan via stretcher.

## 2015-08-09 NOTE — Discharge Instructions (Signed)
Nonspecific Chest Pain  °Chest pain can be caused by many different conditions. There is always a chance that your pain could be related to something serious, such as a heart attack or a blood clot in your lungs. Chest pain can also be caused by conditions that are not life-threatening. If you have chest pain, it is very important to follow up with your health care provider. °CAUSES  °Chest pain can be caused by: °· Heartburn. °· Pneumonia or bronchitis. °· Anxiety or stress. °· Inflammation around your heart (pericarditis) or lung (pleuritis or pleurisy). °· A blood clot in your lung. °· A collapsed lung (pneumothorax). It can develop suddenly on its own (spontaneous pneumothorax) or from trauma to the chest. °· Shingles infection (varicella-zoster virus). °· Heart attack. °· Damage to the bones, muscles, and cartilage that make up your chest wall. This can include: °· Bruised bones due to injury. °· Strained muscles or cartilage due to frequent or repeated coughing or overwork. °· Fracture to one or more ribs. °· Sore cartilage due to inflammation (costochondritis). °RISK FACTORS  °Risk factors for chest pain may include: °· Activities that increase your risk for trauma or injury to your chest. °· Respiratory infections or conditions that cause frequent coughing. °· Medical conditions or overeating that can cause heartburn. °· Heart disease or family history of heart disease. °· Conditions or health behaviors that increase your risk of developing a blood clot. °· Having had chicken pox (varicella zoster). °SIGNS AND SYMPTOMS °Chest pain can feel like: °· Burning or tingling on the surface of your chest or deep in your chest. °· Crushing, pressure, aching, or squeezing pain. °· Dull or sharp pain that is worse when you move, cough, or take a deep breath. °· Pain that is also felt in your back, neck, shoulder, or arm, or pain that spreads to any of these areas. °Your chest pain may come and go, or it may stay  constant. °DIAGNOSIS °Lab tests or other studies may be needed to find the cause of your pain. Your health care provider may have you take a test called an ambulatory ECG (electrocardiogram). An ECG records your heartbeat patterns at the time the test is performed. You may also have other tests, such as: °· Transthoracic echocardiogram (TTE). During echocardiography, sound waves are used to create a picture of all of the heart structures and to look at how blood flows through your heart. °· Transesophageal echocardiogram (TEE). This is a more advanced imaging test that obtains images from inside your body. It allows your health care provider to see your heart in finer detail. °· Cardiac monitoring. This allows your health care provider to monitor your heart rate and rhythm in real time. °· Holter monitor. This is a portable device that records your heartbeat and can help to diagnose abnormal heartbeats. It allows your health care provider to track your heart activity for several days, if needed. °· Stress tests. These can be done through exercise or by taking medicine that makes your heart beat more quickly. °· Blood tests. °· Imaging tests. °TREATMENT  °Your treatment depends on what is causing your chest pain. Treatment may include: °· Medicines. These may include: °· Acid blockers for heartburn. °· Anti-inflammatory medicine. °· Pain medicine for inflammatory conditions. °· Antibiotic medicine, if an infection is present. °· Medicines to dissolve blood clots. °· Medicines to treat coronary artery disease. °· Supportive care for conditions that do not require medicines. This may include: °· Resting. °· Applying heat   or cold packs to injured areas. °· Limiting activities until pain decreases. °HOME CARE INSTRUCTIONS °· If you were prescribed an antibiotic medicine, finish it all even if you start to feel better. °· Avoid any activities that bring on chest pain. °· Do not use any tobacco products, including  cigarettes, chewing tobacco, or electronic cigarettes. If you need help quitting, ask your health care provider. °· Do not drink alcohol. °· Take medicines only as directed by your health care provider. °· Keep all follow-up visits as directed by your health care provider. This is important. This includes any further testing if your chest pain does not go away. °· If heartburn is the cause for your chest pain, you may be told to keep your head raised (elevated) while sleeping. This reduces the chance that acid will go from your stomach into your esophagus. °· Make lifestyle changes as directed by your health care provider. These may include: °· Getting regular exercise. Ask your health care provider to suggest some activities that are safe for you. °· Eating a heart-healthy diet. A registered dietitian can help you to learn healthy eating options. °· Maintaining a healthy weight. °· Managing diabetes, if necessary. °· Reducing stress. °SEEK MEDICAL CARE IF: °· Your chest pain does not go away after treatment. °· You have a rash with blisters on your chest. °· You have a fever. °SEEK IMMEDIATE MEDICAL CARE IF:  °· Your chest pain is worse. °· You have an increasing cough, or you cough up blood. °· You have severe abdominal pain. °· You have severe weakness. °· You faint. °· You have chills. °· You have sudden, unexplained chest discomfort. °· You have sudden, unexplained discomfort in your arms, back, neck, or jaw. °· You have shortness of breath at any time. °· You suddenly start to sweat, or your skin gets clammy. °· You feel nauseous or you vomit. °· You suddenly feel light-headed or dizzy. °· Your heart begins to beat quickly, or it feels like it is skipping beats. °These symptoms may represent a serious problem that is an emergency. Do not wait to see if the symptoms will go away. Get medical help right away. Call your local emergency services (911 in the [place] Do not drive yourself to the hospital. °  °This  information is not intended to replace advice given to you by your health care provider. Make sure you discuss any questions you have with your health care provider. °  °Document Released: 05/02/2005 Document Revised: 08/13/2014 Document Reviewed: 02/26/2014 °Elsevier Interactive Patient Education ©2016 [employer] ° °Shortness of Breath °Shortness of breath means you have trouble breathing. It could also mean that you have a medical problem. You should get immediate medical care for shortness of breath. °CAUSES  °· Not enough oxygen in the air such as with high altitudes or a smoke-filled room. °· Certain lung diseases, infections, or problems. °· Heart disease or conditions, such as angina or heart failure. °· Low red blood cells (anemia). °· Poor physical fitness, which can cause shortness of breath when you exercise. °· Chest or back injuries or stiffness. °· Being overweight. °· Smoking. °· Anxiety, which can make you feel like you are not getting enough air. °DIAGNOSIS  °Serious medical problems can often be found during your physical exam. Tests may also be done to determine why you are having shortness of breath. Tests may include: °· Chest X-rays. °· Lung function tests. °· Blood tests. °· An electrocardiogram (ECG). °· An ambulatory   electrocardiogram. An ambulatory ECG records your heartbeat patterns over a 24-hour period. °· Exercise testing. °· A transthoracic echocardiogram (TTE). During echocardiography, sound waves are used to evaluate how blood flows through your heart. °· A transesophageal echocardiogram (TEE). °· Imaging scans. °Your health care provider may not be able to find a cause for your shortness of breath after your exam. In this case, it is important to have a follow-up exam with your health care provider as directed.  °TREATMENT  °Treatment for shortness of breath depends on the cause of your symptoms and can vary greatly. °HOME CARE INSTRUCTIONS  °· Do not smoke. Smoking is a common  cause of shortness of breath. If you smoke, ask for help to quit. °· Avoid being around chemicals or things that may bother your breathing, such as paint fumes and dust. °· Rest as needed. Slowly resume your usual activities. °· If medicines were prescribed, take them as directed for the full length of time directed. This includes oxygen and any inhaled medicines. °· Keep all follow-up appointments as directed by your health care provider. °SEEK MEDICAL CARE IF:  °· Your condition does not improve in the time expected. °· You have a hard time doing your normal activities even with rest. °· You have any new symptoms. °SEEK IMMEDIATE MEDICAL CARE IF:  °· Your shortness of breath gets worse. °· You feel light-headed, faint, or develop a cough not controlled with medicines. °· You start coughing up blood. °· You have pain with breathing. °· You have chest pain or pain in your arms, shoulders, or abdomen. °· You have a fever. °· You are unable to walk up stairs or exercise the way you normally do. °MAKE SURE YOU: °· Understand these instructions. °· Will watch your condition. °· Will get help right away if you are not doing well or get worse. °  °This information is not intended to replace advice given to you by your health care provider. Make sure you discuss any questions you have with your health care provider. °  °Document Released: 04/17/2001 Document Revised: 07/28/2013 Document Reviewed: 10/08/2011 °Elsevier Interactive Patient Education ©2016 [employer]vier Inc. ° °

## 2015-11-17 ENCOUNTER — Encounter: Payer: Self-pay | Admitting: *Deleted

## 2015-11-21 ENCOUNTER — Encounter
Admission: RE | Disposition: A | Discharge status: Home / Self Care | Payer: Self-pay | Source: Ambulatory Visit | Attending: Gastroenterology

## 2015-11-21 ENCOUNTER — Ambulatory Visit
Admission: RE | Admit: 2015-11-21 | Discharge: 2015-11-21 | Disposition: A | Discharge status: Home / Self Care | Payer: BLUE CROSS/BLUE SHIELD | Source: Ambulatory Visit | Attending: Gastroenterology | Admitting: Gastroenterology

## 2015-11-21 ENCOUNTER — Ambulatory Visit: Payer: BLUE CROSS/BLUE SHIELD | Admitting: Anesthesiology

## 2015-11-21 ENCOUNTER — Encounter: Payer: Self-pay | Admitting: *Deleted

## 2015-11-21 DIAGNOSIS — E1140 Type 2 diabetes mellitus with diabetic neuropathy, unspecified: Secondary | ICD-10-CM | POA: Diagnosis not present

## 2015-11-21 DIAGNOSIS — K2270 Barrett's esophagus without dysplasia: Secondary | ICD-10-CM | POA: Diagnosis not present

## 2015-11-21 DIAGNOSIS — Z885 Allergy status to narcotic agent status: Secondary | ICD-10-CM | POA: Insufficient documentation

## 2015-11-21 DIAGNOSIS — Z7982 Long term (current) use of aspirin: Secondary | ICD-10-CM | POA: Insufficient documentation

## 2015-11-21 DIAGNOSIS — Q438 Other specified congenital malformations of intestine: Secondary | ICD-10-CM | POA: Diagnosis not present

## 2015-11-21 DIAGNOSIS — F329 Major depressive disorder, single episode, unspecified: Secondary | ICD-10-CM | POA: Diagnosis not present

## 2015-11-21 DIAGNOSIS — K2950 Unspecified chronic gastritis without bleeding: Secondary | ICD-10-CM | POA: Diagnosis not present

## 2015-11-21 DIAGNOSIS — Z79899 Other long term (current) drug therapy: Secondary | ICD-10-CM | POA: Diagnosis not present

## 2015-11-21 DIAGNOSIS — I10 Essential (primary) hypertension: Secondary | ICD-10-CM | POA: Insufficient documentation

## 2015-11-21 DIAGNOSIS — Z7984 Long term (current) use of oral hypoglycemic drugs: Secondary | ICD-10-CM | POA: Insufficient documentation

## 2015-11-21 DIAGNOSIS — E785 Hyperlipidemia, unspecified: Secondary | ICD-10-CM | POA: Insufficient documentation

## 2015-11-21 DIAGNOSIS — K210 Gastro-esophageal reflux disease with esophagitis: Secondary | ICD-10-CM | POA: Insufficient documentation

## 2015-11-21 DIAGNOSIS — K2970 Gastritis, unspecified, without bleeding: Secondary | ICD-10-CM | POA: Diagnosis not present

## 2015-11-21 DIAGNOSIS — K319 Disease of stomach and duodenum, unspecified: Secondary | ICD-10-CM | POA: Diagnosis not present

## 2015-11-21 DIAGNOSIS — Z6841 Body Mass Index (BMI) 40.0 and over, adult: Secondary | ICD-10-CM | POA: Diagnosis not present

## 2015-11-21 HISTORY — PX: ESOPHAGOGASTRODUODENOSCOPY (EGD) WITH PROPOFOL: SHX5813

## 2015-11-21 LAB — GLUCOSE, CAPILLARY: GLUCOSE-CAPILLARY: 155 mg/dL — AB (ref 65–99)

## 2015-11-21 LAB — POCT PREGNANCY, URINE: PREG TEST UR: NEGATIVE

## 2015-11-21 SURGERY — ESOPHAGOGASTRODUODENOSCOPY (EGD) WITH PROPOFOL
Anesthesia: General

## 2015-11-21 MED ORDER — MIDAZOLAM HCL 5 MG/5ML IJ SOLN
INTRAMUSCULAR | Status: DC | PRN
  Administered 2015-11-21: 2 mg via INTRAVENOUS

## 2015-11-21 MED ORDER — PHENYLEPHRINE HCL 10 MG/ML IJ SOLN
INTRAMUSCULAR | Status: DC | PRN
  Administered 2015-11-21: 100 ug via INTRAVENOUS
  Administered 2015-11-21: 300 ug via INTRAVENOUS

## 2015-11-21 MED ORDER — PROPOFOL 500 MG/50ML IV EMUL
INTRAVENOUS | Status: DC | PRN
  Administered 2015-11-21: 160 ug/kg/min via INTRAVENOUS

## 2015-11-21 MED ORDER — SODIUM CHLORIDE 0.9 % IV SOLN
INTRAVENOUS | Status: DC
  Administered 2015-11-21: 15:00:00 via INTRAVENOUS

## 2015-11-21 MED ORDER — LIDOCAINE HCL (CARDIAC) 20 MG/ML IV SOLN
INTRAVENOUS | Status: DC | PRN
  Administered 2015-11-21: 100 mg via INTRAVENOUS

## 2015-11-21 MED ORDER — SODIUM CHLORIDE 0.9 % IV SOLN: INTRAVENOUS | Status: DC

## 2015-11-21 MED ORDER — EPHEDRINE SULFATE 50 MG/ML IJ SOLN
INTRAMUSCULAR | Status: DC | PRN
  Administered 2015-11-21: 10 mg via INTRAVENOUS

## 2015-11-21 MED ORDER — KETAMINE HCL 50 MG/ML IJ SOLN
INTRAMUSCULAR | Status: DC | PRN
  Administered 2015-11-21: 20 mg via INTRAMUSCULAR

## 2015-11-21 MED ORDER — PROPOFOL 10 MG/ML IV BOLUS
INTRAVENOUS | Status: DC | PRN
  Administered 2015-11-21: 100 mg via INTRAVENOUS

## 2015-11-21 NOTE — Anesthesia Preprocedure Evaluation (Signed)
Anesthesia Evaluation  Patient identified by MRN, date of birth, ID band Patient awake    Reviewed: Allergy & Precautions, NPO status , Patient's Chart, lab work & pertinent test results  Airway Mallampati: III       Dental  (+) Teeth Intact   Pulmonary sleep apnea and Continuous Positive Airway Pressure Ventilation , former smoker,     + decreased breath sounds      Cardiovascular hypertension, Pt. on medications + DOE   Rhythm:Regular     Neuro/Psych    GI/Hepatic Neg liver ROS, PUD,   Endo/Other  diabetes, Well Controlled, Type 1, Insulin DependentMorbid obesity  Renal/GU      Musculoskeletal   Abdominal (+) + obese,   Peds  Hematology   Anesthesia Other Findings   Reproductive/Obstetrics                             Anesthesia Physical Anesthesia Plan  ASA: III  Anesthesia Plan: General   Post-op Pain Management:    Induction: Intravenous  Airway Management Planned: Natural Airway and Nasal Cannula  Additional Equipment:   Intra-op Plan:   Post-operative Plan:   Informed Consent: I have reviewed the patients History and Physical, chart, labs and discussed the procedure including the risks, benefits and alternatives for the proposed anesthesia with the patient or authorized representative who has indicated his/her understanding and acceptance.     Plan Discussed with: CRNA  Anesthesia Plan Comments:         Anesthesia Quick Evaluation

## 2015-11-21 NOTE — Op Note (Addendum)
[facility] Gastroenterology Patient Name: [name] Procedure Date: 11/21/2015 8:16 AM MRN: [id number] Account #: [id number] Date of Birth: 05/08/1976 Admit Type: Outpatient Age: 40 Room: [facility] ENDO ROOM 3 Gender: Female Note Status: Supervisor Override Procedure:            Upper GI endoscopy Indications:          Follow-up of Barrett's esophagus Providers:            Christena Deem, MD Referring MD:         Myrle Sheng. Katrinka Blazing, MD (Referring MD) Medicines:            Monitored Anesthesia Care Complications:        No immediate complications. Procedure:            Pre-Anesthesia Assessment:                       - ASA Grade Assessment: III - A patient with severe                        systemic disease.                       After obtaining informed consent, the endoscope was                        passed under direct vision. Throughout the procedure,                        the patient's blood pressure, pulse, and oxygen                        saturations were monitored continuously. The Endoscope                        was introduced through the mouth, and advanced to the                        third part of duodenum. The patient tolerated the                        procedure well. Findings:      Barrett's esophagus was present at the gastroesophageal junction. The       maximum longitudinal extent of these mucosal changes was 0.5 cm in       length. Mucosa was biopsied with a cold forceps for histology in 4       quadrants.      The exam of the esophagus was otherwise normal.      Segmental mild inflammation characterized by congestion (edema) and       erythema was found in the cardia. Biopsies were taken with a cold       forceps for histology.      Localized moderate mucosal variance characterized by discoloration and       altered texture was found on the posterior wall of the stomach. Biopsies       were taken with a cold forceps for histology.     Diffuse and patchy mild inflammation characterized by erythema was found       in the gastric body. Biopsies were taken with a cold forceps for       histology and Helicobacter  pylori testing.      The cardia and gastric fundus were normal on retroflexion.      The examined duodenum was normal. Impression:           - Barrett's esophagus. Biopsied.                       - Gastritis. Biopsied.                       - Gastric mucosal variant. Biopsied.                       - Bile gastritis. Biopsied.                       - Normal examined duodenum. Recommendation:       - Await pathology results.                       - Use Prilosec (omeprazole) 40 mg PO daily daily. Procedure Code(s):    --- Professional ---                       [phone], Esophagogastroduodenoscopy, flexible, transoral;                        with biopsy, single or multiple Diagnosis Code(s):    --- Professional ---                       K22.70, Barrett's esophagus without dysplasia                       K29.70, Gastritis, unspecified, without bleeding                       K29.60, Other gastritis without bleeding                       K31.89, Other diseases of stomach and duodenum CPT copyright 2016 [employer]. All rights reserved. The codes documented in this report are preliminary and upon coder review may  be revised to meet current compliance requirements. Christena DeemMartin U Myeasha Ballowe, MD 11/21/2015 3:40:23 PM This report has been signed electronically. Number of Addenda: 0 Note Initiated On: 11/21/2015 8:16 AM      [facility]

## 2015-11-21 NOTE — H&P (Signed)
Outpatient short stay form Pre-procedure 11/21/2015 3:15 PM Brooke Deem MD  Primary Physician: Dr Sula Rumple, Dr Hoy Morn  Reason for visit:  EGD  History of present illness:  Patient is a 40 year old female presenting today for EGD. He has a history of Barrett's esophagus. In taking 20 mg of Prilosec daily however that was recently changed to 40. She has been continuing to take probiotic on a regular basis. She states she feels better than she did previously when she had her scope. He still gets some occasional heartburn symptoms or no dysphagia.  She is also seeing Dr. Hoy Morn for possible bariatric surgery.    Current facility-administered medications:  .  0.9 %  sodium chloride infusion, , Intravenous, Continuous, Brooke Deem, MD, Last Rate: 20 mL/hr at 11/21/15 1501 .  0.9 %  sodium chloride infusion, , Intravenous, Continuous, Brooke Deem, MD  Prescriptions prior to admission  Medication Sig Dispense Refill Last Dose  . Adalimumab (HUMIRA) 40 MG/0.8ML PSKT Inject 40 mg into the skin once a week.   Past Month at Unknown time  . Alpha Lipoic Acid 200 MG CAPS Take 200 mg by mouth daily.   11/21/2015 at 0600  . ascorbic Acid (VITAMIN C) 500 MG CPCR Take 500 mg by mouth daily.   Past Week at Unknown time  . aspirin EC 81 MG tablet Take 81 mg by mouth daily.   11/20/2015 at Unknown time  . atorvastatin (LIPITOR) 20 MG tablet Take 20 mg by mouth daily.   11/21/2015 at 0600  . cholecalciferol (VITAMIN D) 1000 units tablet Take 1,000 Units by mouth daily.   11/21/2015 at 0600  . dapagliflozin propanediol (FARXIGA) 10 MG TABS tablet Take 10 mg by mouth daily.   11/20/2015 at Unknown time  . doxycycline (VIBRA-TABS) 100 MG tablet Take 100 mg by mouth daily.   11/21/2015 at 0600  . DULoxetine (CYMBALTA) 30 MG capsule Take 30 mg by mouth daily.   11/20/2015 at Unknown time  . ergocalciferol (VITAMIN D2) 50000 UNITS capsule Take 50,000 Units by mouth 3 (three) times a  week.    Past Month at Unknown time  . fluconazole (DIFLUCAN) 200 MG tablet Take 200 mg by mouth once a week.    Past Week at Unknown time  . furosemide (LASIX) 20 MG tablet Take 20 mg by mouth.   11/21/2015 at 0600  . lisinopril (PRINIVIL,ZESTRIL) 10 MG tablet Take 10 mg by mouth daily.   11/20/2015 at Unknown time  . LORazepam (ATIVAN) 0.5 MG tablet Take 1 mg by mouth 2 (two) times daily.    11/21/2015 at 0600  . meloxicam (MOBIC) 15 MG tablet Take 15 mg by mouth daily.   Past Week at Unknown time  . metFORMIN (GLUCOPHAGE) 500 MG tablet Take 1,000 mg by mouth 2 (two) times daily with a meal.    11/20/2015 at Unknown time  . Omega-3 Fatty Acids (OMEGA-3 FISH OIL) 1200 MG CAPS Take 1 capsule by mouth daily at 8 pm.   11/20/2015 at Unknown time  . omeprazole (PRILOSEC) 20 MG capsule Take 40 mg by mouth daily.    11/20/2015 at Unknown time  . potassium chloride SA (K-DUR,KLOR-CON) 20 MEQ tablet Take 20 mEq by mouth daily.   11/21/2015 at 0600  . pregabalin (LYRICA) 150 MG capsule Take 150 mg by mouth 2 (two) times daily.   11/21/2015 at 0600  . traMADol (ULTRAM) 50 MG tablet Take 1 tablet (50 mg total) by mouth  every 6 (six) hours as needed. 12 tablet 0 Past Month at Unknown time  . Turmeric Curcumin 500 MG CAPS Take 500 mg by mouth daily at 8 pm.   11/21/2015 at 0600  . Albiglutide (TANZEUM) 50 MG PEN Inject 50 mg into the skin once a week. Reported on 11/21/2015   Not Taking at Unknown time  . b complex vitamins tablet Take 1 tablet by mouth daily. Reported on 11/21/2015   Not Taking at Unknown time  . fluticasone (FLONASE) 50 MCG/ACT nasal spray Place 2 sprays into both nostrils daily. Reported on 11/21/2015   Not Taking at Unknown time  . magnesium gluconate (MAGONATE) 500 MG tablet Take 250 mg by mouth 2 (two) times a week. Reported on 11/21/2015   Not Taking at Unknown time     Allergies  Allergen Reactions  . Hydrocodone   . Percocet [Oxycodone-Acetaminophen] Other (See Comments)    Reaction: really  sick, couldn't move, dizzy, per patient felt like low blood sugar     Past Medical History  Diagnosis Date  . Diabetes mellitus without complication (HCC)   . Hyperlipidemia   . Obesity   . Hidradenitis suppurativa   . Hypertension   . Depression   . Neuropathy (HCC)   . Diverticulitis   . Redundant colon   . Erosive esophagitis   . Bile-induced gastritis   . Gastropathy   . Barrett's esophagus     Review of systems:      Physical Exam    Heart and lungs: Regular rate and rhythm without rub or gallop, lungs are bilaterally clear.    HEENT: Normocephalic atraumatic eyes are anicteric    Other: Morbidly obese    Pertinant exam for procedure: Soft nontender nondistended bowel sounds positive normoactive    Planned proceedures: EGD and indicated procedures. I have discussed the risks benefits and complications of procedures to include not limited to bleeding, infection, perforation and the risk of sedation and the patient wishes to proceed.    Brooke DeemMartin U Skulskie, MD Gastroenterology 11/21/2015  3:15 PM

## 2015-11-21 NOTE — Transfer of Care (Signed)
Immediate Anesthesia Transfer of Care Note  Patient: Brooke CoonKatie [name]  Procedure(s) Performed: Procedure(s): ESOPHAGOGASTRODUODENOSCOPY (EGD) WITH PROPOFOL (N/A)  Patient Location: PACU  Anesthesia Type:General  Level of Consciousness: awake, alert , oriented and patient cooperative  Airway & Oxygen Therapy: Patient Spontanous Breathing and Patient connected to nasal cannula oxygen  Post-op Assessment: Report given to RN and Post -op Vital signs reviewed and stable  Post vital signs: Reviewed and stable  Last Vitals:  Filed Vitals:   11/21/15 1420  BP: 136/73  Pulse: 120  Temp: 36.9 C  Resp: 18    Complications: No apparent anesthesia complications

## 2015-11-22 NOTE — Anesthesia Postprocedure Evaluation (Signed)
Anesthesia Post Note  Patient: [name]  Procedure(s) Performed: Procedure(s) (LRB): ESOPHAGOGASTRODUODENOSCOPY (EGD) WITH PROPOFOL (N/A)  Patient location during evaluation: Endoscopy Anesthesia Type: General Level of consciousness: awake and alert Pain management: pain level controlled Vital Signs Assessment: post-procedure vital signs reviewed and stable Respiratory status: spontaneous breathing, nonlabored ventilation, respiratory function stable and patient connected to nasal cannula oxygen Cardiovascular status: blood pressure returned to baseline and stable Postop Assessment: no signs of nausea or vomiting Anesthetic complications: no    Last Vitals:  Filed Vitals:   11/21/15 1605 11/21/15 1617  BP: 107/66 97/31  Pulse: 122 114  Temp:    Resp: 26 19    Last Pain:  Filed Vitals:   11/21/15 1646  PainSc: 8                  [name] K Yosmar Ryker

## 2015-11-24 LAB — SURGICAL PATHOLOGY

## 2016-04-25 ENCOUNTER — Other Ambulatory Visit: Payer: Self-pay | Admitting: Internal Medicine

## 2016-04-25 DIAGNOSIS — R0602 Shortness of breath: Secondary | ICD-10-CM

## 2016-05-02 ENCOUNTER — Ambulatory Visit: Admission: RE | Admit: 2016-05-02 | Payer: BLUE CROSS/BLUE SHIELD | Source: Ambulatory Visit

## 2016-05-03 ENCOUNTER — Ambulatory Visit: Payer: BLUE CROSS/BLUE SHIELD

## 2016-05-21 ENCOUNTER — Encounter
Admission: RE | Admit: 2016-05-21 | Discharge: 2016-05-21 | Disposition: A | Discharge status: Home / Self Care | Payer: BLUE CROSS/BLUE SHIELD | Source: Ambulatory Visit | Attending: Internal Medicine | Admitting: Internal Medicine

## 2016-05-21 DIAGNOSIS — R0602 Shortness of breath: Secondary | ICD-10-CM | POA: Insufficient documentation

## 2016-05-21 MED ORDER — REGADENOSON 0.4 MG/5ML IV SOLN
0.4000 mg | Freq: Once | INTRAVENOUS | Status: AC
  Administered 2016-05-21: 0.4 mg via INTRAVENOUS

## 2016-05-21 MED ORDER — TECHNETIUM TC 99M TETROFOSMIN IV KIT
33.0000 | PACK | Freq: Once | INTRAVENOUS | Status: AC | PRN
  Administered 2016-05-21: 28.58 via INTRAVENOUS

## 2016-05-22 ENCOUNTER — Encounter
Admission: RE | Admit: 2016-05-22 | Discharge: 2016-05-22 | Disposition: A | Discharge status: Home / Self Care | Payer: BLUE CROSS/BLUE SHIELD | Source: Ambulatory Visit | Attending: Internal Medicine | Admitting: Internal Medicine

## 2016-05-22 ENCOUNTER — Ambulatory Visit
Admission: RE | Admit: 2016-05-22 | Discharge: 2016-05-22 | Disposition: A | Discharge status: Home / Self Care | Payer: BLUE CROSS/BLUE SHIELD | Source: Ambulatory Visit | Attending: Internal Medicine | Admitting: Internal Medicine

## 2016-05-22 DIAGNOSIS — I10 Essential (primary) hypertension: Secondary | ICD-10-CM | POA: Diagnosis not present

## 2016-05-22 DIAGNOSIS — Z6841 Body Mass Index (BMI) 40.0 and over, adult: Secondary | ICD-10-CM | POA: Insufficient documentation

## 2016-05-22 DIAGNOSIS — E785 Hyperlipidemia, unspecified: Secondary | ICD-10-CM | POA: Insufficient documentation

## 2016-05-22 DIAGNOSIS — E1140 Type 2 diabetes mellitus with diabetic neuropathy, unspecified: Secondary | ICD-10-CM | POA: Diagnosis not present

## 2016-05-22 DIAGNOSIS — E669 Obesity, unspecified: Secondary | ICD-10-CM | POA: Insufficient documentation

## 2016-05-22 DIAGNOSIS — F329 Major depressive disorder, single episode, unspecified: Secondary | ICD-10-CM | POA: Insufficient documentation

## 2016-05-22 DIAGNOSIS — R0602 Shortness of breath: Secondary | ICD-10-CM | POA: Insufficient documentation

## 2016-05-22 MED ORDER — TECHNETIUM TC 99M TETROFOSMIN IV KIT
30.5850 | PACK | Freq: Once | INTRAVENOUS | Status: AC | PRN
  Administered 2016-05-22: 30.585 via INTRAVENOUS

## 2016-05-22 NOTE — Progress Notes (Signed)
*  PRELIMINARY RESULTS* Echocardiogram 2D Echocardiogram has been performed.  Cristela BlueHege, Dorothia Passmore 05/22/2016, 10:25 AM

## 2016-06-05 LAB — NM MYOCAR MULTI W/SPECT W/WALL MOTION / EF
CHL CUP NUCLEAR SDS: 7
CSEPED: 1 min
CSEPEDS: 0 s
CSEPEW: 1 METS
CSEPHR: 64 %
LV sys vol: 36 mL
LVDIAVOL: 81 mL (ref 46–106)
MPHR: 181 {beats}/min
NUC STRESS TID: 0.77
Peak HR: 117 {beats}/min
Rest HR: 96 {beats}/min
SRS: 17
SSS: 23

## 2016-06-27 ENCOUNTER — Ambulatory Visit: Payer: 59 | Admitting: Nurse Practitioner

## 2016-07-05 ENCOUNTER — Ambulatory Visit: Payer: BLUE CROSS/BLUE SHIELD | Attending: Family Medicine | Admitting: Occupational Therapy

## 2016-07-18 ENCOUNTER — Encounter: Payer: Self-pay | Admitting: Occupational Therapy

## 2016-07-18 ENCOUNTER — Ambulatory Visit: Payer: Medicaid Other | Attending: Family Medicine | Admitting: Occupational Therapy

## 2016-07-18 DIAGNOSIS — I890 Lymphedema, not elsewhere classified: Secondary | ICD-10-CM

## 2016-07-18 NOTE — Patient Instructions (Signed)

## 2016-07-23 ENCOUNTER — Encounter: Payer: Medicaid Other | Attending: Surgery | Admitting: Surgery

## 2016-07-23 DIAGNOSIS — Z79899 Other long term (current) drug therapy: Secondary | ICD-10-CM | POA: Diagnosis not present

## 2016-07-23 DIAGNOSIS — I10 Essential (primary) hypertension: Secondary | ICD-10-CM | POA: Insufficient documentation

## 2016-07-23 DIAGNOSIS — L97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 right foot with fat layer exposed: Secondary | ICD-10-CM | POA: Diagnosis not present

## 2016-07-23 DIAGNOSIS — Z87891 Personal history of nicotine dependence: Secondary | ICD-10-CM | POA: Insufficient documentation

## 2016-07-23 DIAGNOSIS — G4733 Obstructive sleep apnea (adult) (pediatric): Secondary | ICD-10-CM | POA: Diagnosis not present

## 2016-07-23 DIAGNOSIS — M797 Fibromyalgia: Secondary | ICD-10-CM | POA: Diagnosis not present

## 2016-07-23 DIAGNOSIS — E1140 Type 2 diabetes mellitus with diabetic neuropathy, unspecified: Secondary | ICD-10-CM | POA: Diagnosis not present

## 2016-07-23 DIAGNOSIS — F329 Major depressive disorder, single episode, unspecified: Secondary | ICD-10-CM | POA: Insufficient documentation

## 2016-07-23 DIAGNOSIS — I890 Lymphedema, not elsewhere classified: Secondary | ICD-10-CM | POA: Diagnosis not present

## 2016-07-23 DIAGNOSIS — Z7982 Long term (current) use of aspirin: Secondary | ICD-10-CM | POA: Diagnosis not present

## 2016-07-23 DIAGNOSIS — Z6841 Body Mass Index (BMI) 40.0 and over, adult: Secondary | ICD-10-CM | POA: Diagnosis not present

## 2016-07-23 DIAGNOSIS — Z794 Long term (current) use of insulin: Secondary | ICD-10-CM | POA: Insufficient documentation

## 2016-07-23 DIAGNOSIS — E11621 Type 2 diabetes mellitus with foot ulcer: Secondary | ICD-10-CM | POA: Diagnosis not present

## 2016-07-23 DIAGNOSIS — L732 Hidradenitis suppurativa: Secondary | ICD-10-CM | POA: Insufficient documentation

## 2016-07-23 DIAGNOSIS — F411 Generalized anxiety disorder: Secondary | ICD-10-CM | POA: Diagnosis not present

## 2016-07-23 NOTE — Progress Notes (Signed)
[name], [name] ([id number]) Visit Report for 07/23/2016 Abuse/Suicide Risk Screen Details Patient Name: [name], [name] 07/23/2016 8:00 Date of Service: AM Medical Record [id number] Number: Patient Account Number: [id number] Aug 06, 1976 (40 y.o. Treating RN: Curtis Sitesorthy, Joanna Date of Birth/Sex: Female) Other Clinician: Primary Care Physician: Sula RumpleVIRK, CHARANJIT Treating Britto, Errol Referring Physician: Sula RumpleVIRK, CHARANJIT Physician/Extender: Weeks in Treatment: 0 Abuse/Suicide Risk Screen Items Answer ABUSE/SUICIDE RISK SCREEN: Has anyone close to you tried to hurt or harm you recentlyo No Do you feel uncomfortable with anyone in your familyo No Has anyone forced you do things that you didnot want to doo No Do you have any thoughts of harming yourselfo No Patient displays signs or symptoms of abuse and/or neglect. No Electronic Signature(s) Signed: 07/23/2016 4:27:52 PM By: Curtis Sitesorthy, Joanna Entered By: Curtis Sitesorthy, Joanna on 07/23/2016 08:15:37 [name], [name] ([id number]) -------------------------------------------------------------------------------- Activities of Daily Living Details Patient Name: [name], [name] 07/23/2016 8:00 Date of Service: AM Medical Record [id number] Number: Patient Account Number: [id number] Aug 06, 1976 (40 y.o. Treating RN: Curtis Sitesorthy, Joanna Date of Birth/Sex: Female) Other Clinician: Primary Care Physician: Sula RumpleVIRK, CHARANJIT Treating Evlyn KannerBritto, Errol Referring Physician: Sula RumpleVIRK, CHARANJIT Physician/Extender: Weeks in Treatment: 0 Activities of Daily Living Items Answer Activities of Daily Living (Please select one for each item) Drive Automobile Need Assistance Take Medications Completely Able Use Telephone Completely Able Care for Appearance Completely Able Use Toilet Completely Able Bath / Shower Completely Able Dress Self Completely Able Feed Self Completely Able Walk Need Assistance Get In / Out Bed Completely Able Housework Need Assistance Prepare  Meals Completely Able Handle Money Completely Able Shop for Self Need Assistance Electronic Signature(s) Signed: 07/23/2016 4:27:52 PM By: Curtis Sitesorthy, Joanna Entered By: Curtis Sitesorthy, Joanna on 07/23/2016 08:16:18 [name], [name] ([id number]) -------------------------------------------------------------------------------- Education Assessment Details Patient Name: [name], [name] 07/23/2016 8:00 Date of Service: AM Medical Record [id number] Number: Patient Account Number: [id number] Aug 06, 1976 (40 y.o. Treating RN: Curtis Sitesorthy, Joanna Date of Birth/Sex: Female) Other Clinician: Primary Care Physician: Sula RumpleVIRK, CHARANJIT Treating Britto, Errol Referring Physician: Sula RumpleVIRK, CHARANJIT Physician/Extender: Tania AdeWeeks in Treatment: 0 Primary Learner Assessed: Caregiver friend Reason Patient is not Primary Learner: wound location Learning Preferences/Education Level/Primary Language Learning Preference: Explanation, Demonstration Highest Education Level: High School Preferred Language: English Cognitive Barrier Assessment/Beliefs Language Barrier: No Translator Needed: No Memory Deficit: No Emotional Barrier: No Cultural/Religious Beliefs Affecting Medical No Care: Physical Barrier Assessment Impaired Vision: No Impaired Hearing: No Decreased Hand dexterity: No Knowledge/Comprehension Assessment Knowledge Level: Medium Comprehension Level: Medium Ability to understand written Medium instructions: Ability to understand verbal Medium instructions: Motivation Assessment Anxiety Level: Calm Cooperation: Cooperative Education Importance: Acknowledges Need Interest in Health Problems: Asks Questions Perception: Coherent Willingness to Engage in Self- Medium Management Activities: Medium [name] ([id number]) Readiness to Engage in Self- Management Activities: Electronic Signature(s) Signed: 07/23/2016 4:27:52 PM By: Curtis Sitesorthy, Joanna Entered By: Curtis Sitesorthy, Joanna on 07/23/2016  08:16:54 [name], [name] ([id number]) -------------------------------------------------------------------------------- Fall Risk Assessment Details Patient Name: [name], [name] 07/23/2016 8:00 Date of Service: AM Medical Record [id number] Number: Patient Account Number: [id number] Aug 06, 1976 (40 y.o. Treating RN: Curtis Sitesorthy, Joanna Date of Birth/Sex: Female) Other Clinician: Primary Care Physician: Sula RumpleVIRK, CHARANJIT Treating Britto, Errol Referring Physician: Sula RumpleVIRK, CHARANJIT Physician/Extender: Weeks in Treatment: 0 Fall Risk Assessment Items Have you had 2 or more falls in the last 12 monthso 0 No Have you had any fall that resulted in injury in the last 12 monthso 0 No FALL RISK ASSESSMENT: History of falling - immediate or within 3 months 25 Yes Secondary diagnosis 0 No Ambulatory aid  None/bed rest/wheelchair/nurse 0 No Crutches/cane/walker 15 Yes Furniture 0 No IV Access/Saline Lock 0 No Gait/Training Normal/bed rest/immobile 0 No Weak 10 Yes Impaired 0 No Mental Status Oriented to own ability 0 Yes Electronic Signature(s) Signed: 07/23/2016 4:27:52 PM By: Curtis Sitesorthy, Joanna Entered By: Curtis Sitesorthy, Joanna on 07/23/2016 08:17:12 [name], [name] ([id number]) -------------------------------------------------------------------------------- Foot Assessment Details Patient Name: [name], [name] 07/23/2016 8:00 Date of Service: AM Medical Record [id number] Number: Patient Account Number: [id number] 04-Oct-1975 (40 y.o. Treating RN: Curtis Sitesorthy, Joanna Date of Birth/Sex: Female) Other Clinician: Primary Care Physician: Sula RumpleVIRK, CHARANJIT Treating Britto, Errol Referring Physician: Sula RumpleVIRK, CHARANJIT Physician/Extender: Weeks in Treatment: 0 Foot Assessment Items Site Locations + = Sensation present, - = Sensation absent, C = Callus, U = Ulcer R = Redness, W = Warmth, M = Maceration, PU = Pre-ulcerative lesion F = Fissure, S = Swelling, D = Dryness Assessment Right: Left: Other  Deformity: No No Prior Foot Ulcer: No No Prior Amputation: No No Charcot Joint: No No Ambulatory Status: Ambulatory With Help Assistance Device: Walker Gait: Steady Electronic Signature(s) Signed: 07/23/2016 4:27:52 PM By: Curtis Sitesorthy, Joanna Entered By: Curtis Sitesorthy, Joanna on 07/23/2016 08:42:31 [name], [name] ([id number]) [name], [name] ([id number]) -------------------------------------------------------------------------------- Nutrition Risk Assessment Details Patient Name: [name], [name] 07/23/2016 8:00 Date of Service: AM Medical Record [id number] Number: Patient Account Number: [id number] 04-Oct-1975 (40 y.o. Treating RN: Curtis Sitesorthy, Joanna Date of Birth/Sex: Female) Other Clinician: Primary Care Physician: Sula RumpleVIRK, CHARANJIT Treating Britto, Errol Referring Physician: Sula RumpleVIRK, CHARANJIT Physician/Extender: Weeks in Treatment: 0 Height (in): 66 Weight (lbs): 399 Body Mass Index (BMI): 64.4 Nutrition Risk Assessment Items NUTRITION RISK SCREEN: I have an illness or condition that made me change the kind and/or 0 No amount of food I eat I eat fewer than two meals per Day 0 No I eat few fruits and vegetables, or milk products 0 No I have three or more drinks of beer, liquor or wine almost every Day 0 No I have tooth or mouth problems that make it hard for me to eat 0 No I don't always have enough money to buy the food I need 0 No I eat alone most of the time 0 No I take three or more different prescribed or over-the-counter drugs a 1 Yes Day Without wanting to, I have lost or gained 10 pounds in the last six 0 No months I am not always physically able to shop, cook and/or feed myself 0 No Nutrition Protocols Good Risk Protocol 0 No interventions needed Moderate Risk Protocol Electronic Signature(s) Signed: 07/23/2016 4:27:52 PM By: Curtis Sitesorthy, Joanna Entered By: Curtis Sitesorthy, Joanna on 07/23/2016 08:17:20

## 2016-07-23 NOTE — Progress Notes (Addendum)
Brooke CoonDUKE Day, [name] ([id number]) Visit Report for 07/23/2016 Allergy List Details Patient Name: Brooke ROSFlorentina Day, [name] Date of Service: 07/23/2016 8:00 AM Medical Record Number: [id number] Patient Account Number: [id number] Date of Birth/Sex: 1975/11/26 (40 y.o. Female) Treating RN: Curtis Sitesorthy, Joanna Primary Care Physician: Sula RumpleVIRK, CHARANJIT Other Clinician: Referring Physician: Sula RumpleVIRK, CHARANJIT Treating Physician/Extender: Rudene ReBritto, Errol Weeks in Treatment: 0 Allergies Active Allergies Lamictal Reaction: hives and swelling oxycodone Reaction: get sick hydrocodone Reaction: get sick Allergy Notes Electronic Signature(s) Signed: 07/23/2016 4:27:52 PM By: Curtis Sitesorthy, Joanna Entered By: Curtis Sitesorthy, Joanna on 07/23/2016 08:14:50 [name], [name] ([id number]) -------------------------------------------------------------------------------- Arrival Information Details Patient Name: [name], [name] Date of Service: 07/23/2016 8:00 AM Medical Record Number: [id number] Patient Account Number: [id number] Date of Birth/Sex: 1975/11/26 (40 y.o. Female) Treating RN: Curtis Sitesorthy, Joanna Primary Care Physician: Sula RumpleVIRK, CHARANJIT Other Clinician: Referring Physician: Sula RumpleVIRK, CHARANJIT Treating Physician/Extender: Rudene ReBritto, Errol Weeks in Treatment: 0 Visit Information Patient Arrived: [name] Arrival Day: 08:08 Accompanied By: friend Transfer Assistance: None Patient Identification Verified: Yes Secondary Verification Process Yes Completed: Patient Has Alerts: Yes Patient Alerts: DMII unable to obtain ABI Electronic Signature(s) Signed: 07/23/2016 4:27:52 PM By: Curtis Sitesorthy, Joanna Entered By: Curtis Sitesorthy, Joanna on 07/23/2016 08:58:59 [name], [name] ([id number]) -------------------------------------------------------------------------------- Clinic Level of Care Assessment Details Patient Name: [name], [name] Date of Service: 07/23/2016 8:00 AM Medical Record Number: [id number] Patient Account Number: [id number] Date of  Birth/Sex: 1975/11/26 (40 y.o. Female) Treating RN: Curtis Sitesorthy, Joanna Primary Care Physician: Sula RumpleVIRK, CHARANJIT Other Clinician: Referring Physician: Sula RumpleVIRK, CHARANJIT Treating Physician/Extender: Rudene ReBritto, Errol Weeks in Treatment: 0 Clinic Level of Care Assessment Items TOOL 1 Quantity Score []  - Use when EandM and Procedure is performed on INITIAL visit 0 ASSESSMENTS - Nursing Assessment / Reassessment X - General Physical Exam (combine w/ comprehensive assessment (listed just 1 20 below) when performed on new pt. evals) X - Comprehensive Assessment (HX, ROS, Risk Assessments, Wounds Hx, etc.) 1 25 ASSESSMENTS - Wound and Skin Assessment / Reassessment []  - Dermatologic / Skin Assessment (not related to wound area) 0 ASSESSMENTS - Ostomy and/or Continence Assessment and Care []  - Incontinence Assessment and Management 0 []  - Ostomy Care Assessment and Management (repouching, etc.) 0 PROCESS - Coordination of Care X - Simple Patient / Family Education for ongoing care 1 15 []  - Complex (extensive) Patient / Family Education for ongoing care 0 X - Staff obtains Consents, Records, Test Results / Process Orders 1 10 []  - Staff telephones HHA, Nursing Homes / Clarify orders / etc 0 []  - Routine Transfer to another Facility (non-emergent condition) 0 []  - Routine Hospital Admission (non-emergent condition) 0 X - New Admissions / Manufacturing engineernsurance Authorizations / Ordering NPWT, Apligraf, etc. 1 15 []  - Emergency Hospital Admission (emergent condition) 0 PROCESS - Special Needs []  - Pediatric / Minor Patient Management 0 []  - Isolation Patient Management 0 [name], [name] ([id number]) []  - Hearing / Language / Visual special needs 0 []  - Assessment of Community assistance (transportation, D/C planning, etc.) 0 []  - Additional assistance / Altered mentation 0 []  - Support Surface(s) Assessment (bed, cushion, seat, etc.) 0 INTERVENTIONS - Miscellaneous []  - External ear exam 0 []  - Patient Transfer  (multiple staff / Nurse, adultHoyer Lift / Similar devices) 0 []  - Simple Staple / Suture removal (25 or less) 0 []  - Complex Staple / Suture removal (26 or more) 0 []  - Hypo/Hyperglycemic Management (do not check if billed separately) 0 []  - Ankle / Brachial Index (ABI) - do not check if billed separately 0 Has the patient been  seen at the hospital within the last three years: Yes Total Score: 85 Level Of Care: New/Established - Level 3 Electronic Signature(s) Signed: 07/23/2016 4:27:52 PM By: Curtis Sitesorthy, Joanna Entered By: Curtis Sitesorthy, Joanna on 07/23/2016 09:05:06 [name], [name] ([id number]) -------------------------------------------------------------------------------- Encounter Discharge Information Details Patient Name: [name], [name] Date of Service: 07/23/2016 8:00 AM Medical Record Number: [id number] Patient Account Number: [id number] Date of Birth/Sex: 04-Jul-1976 (40 y.o. Female) Treating RN: Curtis Sitesorthy, Joanna Primary Care Physician: Sula RumpleVIRK, CHARANJIT Other Clinician: Referring Physician: Sula RumpleVIRK, CHARANJIT Treating Physician/Extender: Rudene ReBritto, Errol Weeks in Treatment: 0 Encounter Discharge Information Items Discharge Pain Level: 0 Discharge Condition: Stable Ambulatory Status: Wheelchair Discharge Destination: Home Transportation: Private Auto Accompanied By: friend Schedule Follow-up Appointment: Yes Medication Reconciliation completed and provided to Patient/Care No Victoriano Campion: Provided on Clinical Summary of Care: 07/23/2016 Form Type Recipient Paper Patient KDR Electronic Signature(s) Signed: 07/24/2016 8:01:16 AM By: Curtis Sitesorthy, Joanna Previous Signature: 07/23/2016 9:29:31 AM Version By: Gwenlyn PerkingMoore, Shelia Entered By: Curtis Sitesorthy, Joanna on 07/24/2016 08:01:16 [name], [name] ([id number]) -------------------------------------------------------------------------------- Lower Extremity Assessment Details Patient Name: [name], [name] Date of Service: 07/23/2016 8:00 AM Medical Record Number:  [id number] Patient Account Number: [id number] Date of Birth/Sex: 04-Jul-1976 (40 y.o. Female) Treating RN: Curtis Sitesorthy, Joanna Primary Care Physician: Sula RumpleVIRK, CHARANJIT Other Clinician: Referring Physician: Sula RumpleVIRK, CHARANJIT Treating Physician/Extender: Rudene ReBritto, Errol Weeks in Treatment: 0 Edema Assessment Assessed: [Left: No] [Right: No] Edema: [Left: Yes] [Right: Yes] Calf Left: Right: Point of Measurement: 30 cm From Medial Instep 54.2 cm 62.3 cm Ankle Left: Right: Point of Measurement: 8 cm From Medial Instep 27.2 cm 30.1 cm Vascular Assessment Pulses: Posterior Tibial Palpable: [Left:Yes] [Right:Yes] Doppler Audible: [Left:Inaudible] [Right:Inaudible] Popliteal Palpable: [Left:Yes] [Right:Yes] Doppler Audible: [Left:Yes] [Right:Yes] Extremity colors, hair growth, and conditions: Extremity Color: [Left:Normal] [Right:Normal] Hair Growth on Extremity: [Left:Yes] [Right:Yes] Temperature of Extremity: [Left:Warm] [Right:Warm] Capillary Refill: [Left:< 3 seconds] [Right:< 3 seconds] Toe Nail Assessment Left: Right: Thick: Yes Yes Discolored: No No Deformed: No No Improper Length and Hygiene: No No Notes unable to obtain ABI bilateral - could not hear pulse well enough to get a reading [name], [name] ([id number]) Electronic Signature(s) Signed: 07/23/2016 4:27:52 PM By: Curtis Sitesorthy, Joanna Entered By: Curtis Sitesorthy, Joanna on 07/23/2016 08:58:37 [name], [name] ([id number]) -------------------------------------------------------------------------------- Multi Wound Chart Details Patient Name: [name], [name] Date of Service: 07/23/2016 8:00 AM Medical Record Number: [id number] Patient Account Number: [id number] Date of Birth/Sex: 04-Jul-1976 (40 y.o. Female) Treating RN: Curtis Sitesorthy, Joanna Primary Care Physician: Sula RumpleVIRK, CHARANJIT Other Clinician: Referring Physician: Sula RumpleVIRK, CHARANJIT Treating Physician/Extender: Rudene ReBritto, Errol Weeks in Treatment: 0 Vital Signs Height(in): 66 Pulse(bpm):  108 Weight(lbs): 399 Blood Pressure 144/71 (mmHg): Body Mass Index(BMI): 64 Temperature(F): 98.2 Respiratory Rate 18 (breaths/min): Photos: [N/A:N/A] Wound Location: Right Metatarsal head first N/A N/A - Dorsal Wounding Event: Gradually Appeared N/A N/A Primary Etiology: Diabetic Wound/Ulcer of N/A N/A the Lower Extremity Comorbid History: Lymphedema, N/A N/A Hypertension, Type II Diabetes, Neuropathy Date Acquired: 07/05/2016 N/A N/A Weeks of Treatment: 0 N/A N/A Wound Status: Open N/A N/A Pending Amputation on Yes N/A N/A Presentation: Measurements L x W x D 0.2x0.4x0.1 N/A N/A (cm) Area (cm) : 0.063 N/A N/A Volume (cm) : 0.006 N/A N/A Classification: Grade 1 N/A N/A Exudate Amount: Large N/A N/A Exudate Type: Serous N/A N/A Exudate Color: amber N/A N/A Wound Margin: Flat and Intact N/A N/A Granulation Amount: Large (67-100%) N/A N/A [name], [name] ([id number]) Granulation Quality: Pink N/A N/A Necrotic Amount: None Present (0%) N/A N/A Exposed Structures: Fascia: No N/A N/A Fat: No Tendon: No Muscle: No  Joint: No Bone: No Limited to Skin Breakdown Epithelialization: None N/A N/A Periwound Skin Texture: Callus: Yes N/A N/A Edema: No Excoriation: No Induration: No Crepitus: No Fluctuance: No Friable: No Rash: No Scarring: No Periwound Skin Moist: Yes N/A N/A Moisture: Maceration: No Dry/Scaly: No Periwound Skin Color: Atrophie Blanche: No N/A N/A Cyanosis: No Ecchymosis: No Erythema: No Hemosiderin Staining: No Mottled: No Pallor: No Rubor: No Temperature: No Abnormality N/A N/A Tenderness on Yes N/A N/A Palpation: Wound Preparation: Ulcer Cleansing: N/A N/A Rinsed/Irrigated with Saline Topical Anesthetic Applied: Other: lidocaine 4% Treatment Notes Electronic Signature(s) Signed: 07/23/2016 4:27:52 PM By: Curtis Sites Entered By: Curtis Sites on 07/23/2016 08:59:51 [name], [name]  ([id number]) -------------------------------------------------------------------------------- Multi-Disciplinary Care Plan Details Patient Name: [name] Date of Service: 07/23/2016 8:00 AM Medical Record Number: [id number] Patient Account Number: [id number] Date of Birth/Sex: January 13, 1976 (40 y.o. Female) Treating RN: Curtis Sites Primary Care Adysen Raphael: Sula Rumple Other Clinician: Referring Falon Flinchum: Sula Rumple Treating Giankarlo Leamer/Extender: Rudene Re in Treatment: 0 Active Inactive Electronic Signature(s) Signed: 08/28/2016 9:52:34 AM By: Elliot Gurney RN, BSN, Kim RN, BSN Signed: 11/15/2016 4:57:50 PM By: Curtis Sites Previous Signature: 07/23/2016 4:27:52 PM Version By: Curtis Sites Entered By: Elliot Gurney RN, BSN, Kim on 08/28/2016 09:52:33 [name], [name] ([id number]) -------------------------------------------------------------------------------- Pain Assessment Details Patient Name: [name] Date of Service: 07/23/2016 8:00 AM Medical Record Number: [id number] Patient Account Number: [id number] Date of Birth/Sex: 09/03/1975 (40 y.o. Female) Treating RN: Curtis Sites Primary Care Physician: Sula Rumple Other Clinician: Referring Physician: Sula Rumple Treating Physician/Extender: Rudene Re in Treatment: 0 Active Problems Location of Pain Severity and Description of Pain Patient Has Paino Yes Site Locations Pain Location: Pain in Ulcers With Dressing Change: Yes Duration of the Pain. Constant / Intermittento Constant Rate the pain. Current Pain Level: 4 Worst Pain Level: 10 Least Pain Level: 2 Tolerable Pain Level: 3 Pain Management and Medication Current Pain Management: Notes Topical or injectable lidocaine is offered to patient for acute pain when surgical debridement is performed. If needed, Patient is instructed to use over the counter pain medication for the following 24-48 hours after debridement. Wound care MDs do  not prescribed pain medications. Patient has chronic pain or uncontrolled pain. Patient has been instructed to make an appointment with their Primary Care Physician for pain management. Electronic Signature(s) Signed: 07/23/2016 4:27:52 PM By: Curtis Sites Entered By: Curtis Sites on 07/23/2016 08:13:19 [name], [name] ([id number]) -------------------------------------------------------------------------------- Patient/Caregiver Education Details Patient Name: [name] Date of Service: 07/23/2016 8:00 AM Medical Record Number: [id number] Patient Account Number: [id number] Date of Birth/Gender: [birth year] (40 y.o. Female) Treating RN: Curtis Sites Primary Care Physician: Sula Rumple Other Clinician: Referring Physician: Sula Rumple Treating Physician/Extender: Rudene Re in Treatment: 0 Education Assessment Education Provided To: Patient and Caregiver Education Topics Provided Wound/Skin Impairment: Handouts: Other: wound care as ordered Methods: Demonstration, Explain/Verbal Responses: State content correctly Electronic Signature(s) Signed: 07/24/2016 4:40:41 PM By: Curtis Sites Entered By: Curtis Sites on 07/24/2016 08:01:33 [name], [name] ([id number]) -------------------------------------------------------------------------------- Wound Assessment Details Patient Name: [name] Date of Service: 07/23/2016 8:00 AM Medical Record Number: [id number] Patient Account Number: [id number] Date of Birth/Sex: [birth year] (40 y.o. Female) Treating RN: Curtis Sites Primary Care Physician: Sula Rumple Other Clinician: Referring Physician: Sula Rumple Treating Physician/Extender: Rudene Re in Treatment: 0 Wound Status Wound Number: 1 Primary Diabetic Wound/Ulcer of the Lower Etiology: Extremity Wound Location: Right Metatarsal head first - Dorsal Wound Open Status: Wounding Event: Gradually Appeared Comorbid Lymphedema,  Hypertension,  Type II Date Acquired: 07/05/2016 History: Diabetes, Neuropathy Weeks Of Treatment: 0 Clustered Wound: No Pending Amputation On Presentation Photos Wound Measurements Length: (cm) 0.2 Width: (cm) 0.4 Depth: (cm) 0.1 Area: (cm) 0.063 Volume: (cm) 0.006 % Reduction in Area: % Reduction in Volume: Epithelialization: None Tunneling: No Undermining: No Wound Description Classification: Grade 1 Wound Margin: Flat and Intact Exudate Amount: Large Exudate Type: Serous Exudate Color: amber Foul Odor After Cleansing: No Wound Bed Granulation Amount: Large (67-100%) Exposed Structure Granulation Quality: Pink Fascia Exposed: No Necrotic Amount: None Present (0%) Fat Layer Exposed: No [name], Adonica ([id number]) Tendon Exposed: No Muscle Exposed: No Joint Exposed: No Bone Exposed: No Limited to Skin Breakdown Periwound Skin Texture Texture Color No Abnormalities Noted: No No Abnormalities Noted: No Callus: Yes Atrophie Blanche: No Crepitus: No Cyanosis: No Excoriation: No Ecchymosis: No Fluctuance: No Erythema: No Friable: No Hemosiderin Staining: No Induration: No Mottled: No Localized Edema: No Pallor: No Rash: No Rubor: No Scarring: No Temperature / Pain Moisture Temperature: No Abnormality No Abnormalities Noted: No Tenderness on Palpation: Yes Dry / Scaly: No Maceration: No Moist: Yes Wound Preparation Ulcer Cleansing: Rinsed/Irrigated with Saline Topical Anesthetic Applied: Other: lidocaine 4%, Electronic Signature(s) Signed: 07/23/2016 4:27:52 PM By: Curtis Sites Entered By: Curtis Sites on 07/23/2016 08:37:28 [name], [name] ([id number]) -------------------------------------------------------------------------------- Vitals Details Patient Name: [name] Date of Service: 07/23/2016 8:00 AM Medical Record Number: [id number] Patient Account Number: [id number] Date of Birth/Sex: 07-Aug-1975 (40 y.o. Female) Treating  RN: Curtis Sites Primary Care Physician: Sula Rumple Other Clinician: Referring Physician: Sula Rumple Treating Physician/Extender: Rudene Re in Treatment: 0 Vital Signs Day Taken: 08:13 Temperature (F): 98.2 Height (in): 66 Pulse (bpm): 108 Source: Measured Respiratory Rate (breaths/min): 18 Weight (lbs): 399 Blood Pressure (mmHg): 144/71 Source: Measured Reference Range: 80 - 120 mg / dl Body Mass Index (BMI): 64.4 Electronic Signature(s) Signed: 07/23/2016 4:27:52 PM By: Curtis Sites Entered By: Curtis Sites on 07/23/2016 08:13:51

## 2016-07-23 NOTE — Progress Notes (Signed)
Brooke CoonDUKE ROSE, Kaleiyah ([id number]) Visit Report for 07/23/2016 Chief Complaint Document Details Patient Name: [name] 07/23/2016 8:00 Date of Service: AM Medical Record [id number] Number: Patient Account Number: [id number] [birth year] (40 y.o. Treating RN: Curtis Sitesorthy, Joanna Date of Birth/Sex: Female) Other Clinician: Primary Care Physician: Sula RumpleVIRK, CHARANJIT Treating Stokes Rattigan Referring Physician: Sula RumpleVIRK, CHARANJIT Physician/Extender: Weeks in Treatment: 0 Information Obtained from: Patient Chief Complaint Patients presents for treatment of an open diabetic ulcer the right foot plantar aspect for about 3 weeks Electronic Signature(s) Signed: 07/23/2016 9:18:12 AM By: Evlyn KannerBritto, Jennavecia Schwier MD, FACS Entered By: Evlyn KannerBritto, Sheily Lineman on 07/23/2016 09:18:12 [name] ([id number]) -------------------------------------------------------------------------------- Debridement Details Patient Name: [name] 07/23/2016 8:00 Date of Service: AM Medical Record [id number] Number: Patient Account Number: [id number] [birth year] (40 y.o. Treating RN: Curtis Sitesorthy, Joanna Date of Birth/Sex: Female) Other Clinician: Primary Care Physician: Sula RumpleVIRK, CHARANJIT Treating Luverta Korte Referring Physician: Sula RumpleVIRK, CHARANJIT Physician/Extender: Weeks in Treatment: 0 Debridement Performed for Wound #1 Right,Dorsal Metatarsal head first Assessment: Performed By: Physician Evlyn KannerBritto, Veleria Barnhardt, MD Debridement: Debridement Pre-procedure Yes - 09:01 Verification/Time Out Taken: Start Time: 09:01 Pain Control: Lidocaine 4% Topical Solution Level: Skin/Subcutaneous Tissue Total Area Debrided (L x 0.2 (cm) x 0.4 (cm) = 0.08 (cm) W): Tissue and other Viable, Non-Viable, Callus, Fibrin/Slough, Skin, Subcutaneous material debrided: Instrument: Curette Bleeding: Minimum Hemostasis Achieved: Pressure End Time: 09:05 Procedural Pain: 0 Post Procedural Pain: 0 Response to Treatment: Procedure was tolerated well Post  Debridement Measurements of Total Wound Length: (cm) 0.2 Width: (cm) 0.4 Depth: (cm) 0.1 Volume: (cm) 0.006 Character of Wound/Ulcer Post Improved Debridement: Severity of Tissue Post Debridement: Fat layer exposed Post Procedure Diagnosis Same as Pre-procedure Electronic Signature(s) Signed: 07/23/2016 9:17:36 AM By: Evlyn KannerBritto, Ashley Bultema MD, FACS Signed: 07/23/2016 4:27:52 PM By: Randell Looporthy, Joanna Brooke ROSE, [name] ([id number]) Entered By: Evlyn KannerBritto, Lundy Cozart on 07/23/2016 09:17:36 [name] ([id number]) -------------------------------------------------------------------------------- HPI Details Patient Name: [name] 07/23/2016 8:00 Date of Service: AM Medical Record [id number] Number: Patient Account Number: [id number] [birth year] (40 y.o. Treating RN: Curtis Sitesorthy, Joanna Date of Birth/Sex: Female) Other Clinician: Primary Care Physician: Sula RumpleVIRK, CHARANJIT Treating Arizbeth Cawthorn Referring Physician: Sula RumpleVIRK, CHARANJIT Physician/Extender: Weeks in Treatment: 0 History of Present Illness Location: right foot plantar aspect Quality: Patient reports No Pain. Severity: Patient states wound are getting worse. Duration: Patient has had the wound for < 3 weeks prior to presenting for treatment Context: The wound would happen gradually Modifying Factors: Other treatment(s) tried include:saw Dr. Gwyneth RevelsJustin Fowler for removal of a callus from her right foot Associated Signs and Symptoms: Patient reports having difficulty standing for long periods. HPI Description: 40 year old patient who is known to have diabetes mellitus for several years was recently seen by her PCP who referred her to us for a ulcer on her right foot which has been there for about a month. she also has lymphedema and has been referred to the physical therapist for this. Her past medical history significant for anxiety neurosis, Barrett's esophagus, depression, erosive esophagitis, gastropathy, hypertension, morbid obesity,  obstructive sleep apnea, status post Surgery, groin surgery, colonoscopy and EGD. She quit smoking about 4 years ago and used to smoke quite heavily -- 3 packets a Day. She does not drink alcohol. Electronic Signature(s) Signed: 07/23/2016 9:19:44 AM By: Evlyn KannerBritto, Lenay Lovejoy MD, FACS Previous Signature: 07/23/2016 8:46:08 AM Version By: Evlyn KannerBritto, Quanita Barona MD, FACS Entered By: Evlyn KannerBritto, Mariajose Mow on 07/23/2016 09:19:44 [name] ([id number]) -------------------------------------------------------------------------------- Physical Exam Details Patient Name: [name] 07/23/2016 8:00 Date of Service: AM Medical Record [id number] Number: Patient  Account Number: [id number] 1976/03/23 (40 y.o. Treating RN: Curtis Sites Date of Birth/Sex: Female) Other Clinician: Primary Care Physician: Sula Rumple Treating Danielly Ackerley Referring Physician: Sula Rumple Physician/Extender: Weeks in Treatment: 0 Constitutional . Pulse regular. Respirations normal and unlabored. Afebrile. . Eyes Nonicteric. Reactive to light. Ears, Nose, Mouth, and Throat Lips, teeth, and gums WNL.Marland Kitchen Moist mucosa without lesions. Neck supple and nontender. No palpable supraclavicular or cervical adenopathy. Normal sized without goiter. Respiratory WNL. No retractions.. Breath sounds WNL, No rubs, rales, rhonchi, or wheeze.. Cardiovascular ABI could not be measured because of poor bodily habitus. she has got bilateral stage I lymphedema. Chest Breasts symmetical and no nipple discharge.. Breast tissue WNL, no masses, lumps, or tenderness.. Gastrointestinal (GI) Abdomen without masses or tenderness.. No liver or spleen enlargement or tenderness.. Lymphatic No adneopathy. No adenopathy. No adenopathy. Musculoskeletal Adexa without tenderness or enlargement.. Digits and nails w/o clubbing, cyanosis, infection, petechiae, ischemia, or inflammatory conditions.. Integumentary (Hair, Skin) No suspicious lesions. No  crepitus or fluctuance. No peri-wound warmth or erythema. No masses.Marland Kitchen Psychiatric Judgement and insight Intact.. No evidence of depression, anxiety, or agitation.. Notes ulcerated area on the plantar aspect of her right foot at the area of the first metatarsal head with surrounding callus and macerated skin. Sharp debridement was done with a #3 curet and bleeding controlled with pressure Electronic Signature(s) Signed: 07/23/2016 9:21:07 AM By: Evlyn Kanner MD, FACS [name] ([id number]) Entered By: Evlyn Kanner on 07/23/2016 09:21:07 [name] ([id number]) -------------------------------------------------------------------------------- Physician Orders Details Patient Name: [name] 07/23/2016 8:00 Date of Service: AM Medical Record [id number] Number: Patient Account Number: [id number] 1975/12/30 (40 y.o. Treating RN: Curtis Sites Date of Birth/Sex: Female) Other Clinician: Primary Care Physician: Sula Rumple Treating Uilani Sanville Referring Physician: Sula Rumple Physician/Extender: Tania Ade in Treatment: 0 Verbal / Phone Orders: Yes Clinician: Curtis Sites Read Back and Verified: Yes Diagnosis Coding ICD-10 Coding Code Description E11.621 Type 2 diabetes mellitus with foot ulcer L97.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 right foot with fat layer exposed I89.0 Lymphedema, not elsewhere classified E66.01 Morbid (severe) obesity due to excess calories Wound Cleansing Wound #1 Right,Dorsal Metatarsal head first o Clean wound with Normal Saline. o May Shower, gently pat wound dry prior to applying new dressing. Anesthetic Wound #1 Right,Dorsal Metatarsal head first o Topical Lidocaine 4% cream applied to wound bed prior to debridement Primary Wound Dressing Wound #1 Right,Dorsal Metatarsal head first o Prisma Ag Secondary Dressing Wound #1 Right,Dorsal Metatarsal head first o Gauze and Kerlix/Conform Dressing Change  Frequency Wound #1 Right,Dorsal Metatarsal head first o Change dressing every Day. Follow-up Appointments Wound #1 Right,Dorsal Metatarsal head first o Return Appointment in 1 week. [name] ([id number]) Edema Control Wound #1 Right,Dorsal Metatarsal head first o Elevate legs to the level of the heart and pump ankles as often as possible Off-Loading Wound #1 Right,Dorsal Metatarsal head first o Other: - felt Additional Orders / Instructions Wound #1 Right,Dorsal Metatarsal head first o Increase protein intake. Radiology o X-ray, foot - right Electronic Signature(s) Signed: 07/23/2016 4:08:26 PM By: Evlyn Kanner MD, FACS Signed: 07/23/2016 4:27:52 PM By: Curtis Sites Entered By: Curtis Sites on 07/23/2016 09:29:03 [name] ([id number]) -------------------------------------------------------------------------------- Problem List Details Patient Name: [name] 07/23/2016 8:00 Date of Service: AM Medical Record [id number] Number: Patient Account Number: [id number] 09-05-1975 (40 y.o. Treating RN: Curtis Sites Date of Birth/Sex: Female) Other Clinician: Primary Care Physician: Sula Rumple Treating Evlyn Kanner Referring Physician: Sula Rumple Physician/Extender: Weeks in Treatment:  0 Active Problems ICD-10 Encounter Code Description Active Date Diagnosis E11.621 Type 2 diabetes mellitus with foot ulcer 07/23/2016 Yes L97.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 right foot with 07/23/2016 Yes fat layer exposed I89.0 Lymphedema, not elsewhere classified 07/23/2016 Yes E66.01 Morbid (severe) obesity due to excess calories 07/23/2016 Yes Inactive Problems Resolved Problems Electronic Signature(s) Signed: 07/23/2016 9:17:22 AM By: Evlyn Kanner MD, FACS Entered By: Evlyn Kanner on 07/23/2016 09:17:22 [name]  ([id number]) -------------------------------------------------------------------------------- Progress Note Details Patient Name: [name] 07/23/2016 8:00 Date of Service: AM Medical Record [id number] Number: Patient Account Number: [id number] 01-13-1976 (40 y.o. Treating RN: Curtis Sites Date of Birth/Sex: Female) Other Clinician: Primary Care Physician: Sula Rumple Treating Nashla Althoff Referring Physician: Sula Rumple Physician/Extender: Weeks in Treatment: 0 Subjective Chief Complaint Information obtained from Patient Patients presents for treatment of an open diabetic ulcer the right foot plantar aspect for about 3 weeks History of Present Illness (HPI) The following HPI elements were documented for the patient's wound: Location: right foot plantar aspect Quality: Patient reports No Pain. Severity: Patient states wound are getting worse. Duration: Patient has had the wound for < 3 weeks prior to presenting for treatment Context: The wound would happen gradually Modifying Factors: Other treatment(s) tried include:saw Dr. Gwyneth Revels for removal of a callus from her right foot Associated Signs and Symptoms: Patient reports having difficulty standing for long periods. 40 year old patient who is known to have diabetes mellitus for several years was recently seen by her PCP who referred her to [place] for a ulcer on her right foot which has been there for about a month. she also has lymphedema and has been referred to the physical therapist for this. Her past medical history significant for anxiety neurosis, Barrett's esophagus, depression, erosive esophagitis, gastropathy, hypertension, morbid obesity, obstructive sleep apnea, status post Surgery, groin surgery, colonoscopy and EGD. She quit smoking about 4 years ago and used to smoke quite heavily -- 3 packets a Day. She does not drink alcohol. Wound History Patient presents with 2 open wounds that have been  present for approximately about 3 weeks. Patient has been treating wounds in the following manner: mupirocin. Laboratory tests have been performed in the last month. Patient reportedly has not tested positive for an antibiotic resistant organism. Patient reportedly has not tested positive for osteomyelitis. Patient reportedly has not had testing performed to evaluate circulation in the legs. Patient experiences the following problems associated with their wounds: swelling. Patient History Information obtained from Patient. Allergies Lamictal (Reaction: hives and swelling), oxycodone (Reaction: get sick), hydrocodone (Reaction: get sick) [name] ([id number]) Social History Former smoker - quit 2013, Marital Status - Single, Alcohol Use - Never, Drug Use - No History, Caffeine Use - Moderate. Medical History Hematologic/Lymphatic Patient has history of Lymphedema Cardiovascular Patient has history of Hypertension Endocrine Patient has history of Type II Diabetes Neurologic Patient has history of Neuropathy Oncologic Denies history of Received Chemotherapy, Received Radiation Patient is treated with Insulin, Oral Agents. Blood sugar is tested. Medical And Surgical History Notes Gastrointestinal diverticulitis Immunological hidradenitis suppurativa Musculoskeletal fibromyalgia Review of Systems (ROS) Constitutional Symptoms (General Health) The patient has no complaints or symptoms. Eyes The patient has no complaints or symptoms. Ear/Nose/Mouth/Throat The patient has no complaints or symptoms. Hematologic/Lymphatic The patient has no complaints or symptoms. Respiratory The patient has no complaints or symptoms. Cardiovascular The patient has no complaints or symptoms. Gastrointestinal The patient has no complaints or symptoms. Genitourinary The patient has no complaints or  symptoms. Immunological The patient has no complaints or symptoms. Integumentary  (Skin) The patient has no complaints or symptoms. Musculoskeletal The patient has no complaints or symptoms. [name] ([id number]) Neurologic The patient has no complaints or symptoms. Oncologic The patient has no complaints or symptoms. Psychiatric Complains or has symptoms of Anxiety. Medications fluconazole Humira weekly injection insulin Omega-3 Fish Oil potassium chloride clindamycin 1 %-benzoyl peroxide 5 % topical gel topical gel topical lamotrigine 100 mg tablet oral tablet oral Lyrica 150 mg capsule oral capsule oral topiramate XR 150 mg capsule sprinkle,extended release 24 hr oral capsule,sprinkle,ER 24hr oral Daily Probiotic 2.5 billion cell capsule oral capsule oral metformin 1,000 mg tablet oral tablet oral atorvastatin 20 mg tablet oral tablet oral lisinopril 10 mg tablet oral tablet oral alpha lipoic acid 200 mg tablet oral tablet oral turmeric root extract 500 mg capsule oral capsule oral furosemide 40 mg tablet oral tablet oral multivitamin tablet oral tablet oral meloxicam 15 mg tablet oral tablet oral Aspirin Childrens 81 mg chewable tablet oral tablet,chewable oral pantoprazole 40 mg tablet,delayed release oral tablet,delayed release (DR/EC) oral doxycycline hyclate 100 mg capsule oral capsule oral vitamin B complex tablet oral tablet oral Vitamin C With Rose Hips 500 mg tablet oral tablet oral Vitamin D2 50,000 unit capsule oral capsule oral Vitamin D3 1,000 unit tablet oral tablet oral Objective Constitutional Pulse regular. Respirations normal and unlabored. Afebrile. Vitals Time Taken: 8:13 AM, Height: 66 in, Source: Measured, Weight: 399 lbs, Source: Measured, BMI: 64.4, Temperature: 98.2 F, Pulse: 108 bpm, Respiratory Rate: 18 breaths/min, Blood Pressure: 144/71 mmHg. [name] ([id number]) Eyes Nonicteric. Reactive to light. Ears, Nose, Mouth, and Throat Lips, teeth, and gums WNL.Marland Kitchen Moist mucosa without lesions. Neck supple and  nontender. No palpable supraclavicular or cervical adenopathy. Normal sized without goiter. Respiratory WNL. No retractions.. Breath sounds WNL, No rubs, rales, rhonchi, or wheeze.. Cardiovascular ABI could not be measured because of poor bodily habitus. she has got bilateral stage I lymphedema. Chest Breasts symmetical and no nipple discharge.. Breast tissue WNL, no masses, lumps, or tenderness.. Gastrointestinal (GI) Abdomen without masses or tenderness.. No liver or spleen enlargement or tenderness.. Lymphatic No adneopathy. No adenopathy. No adenopathy. Musculoskeletal Adexa without tenderness or enlargement.. Digits and nails w/o clubbing, cyanosis, infection, petechiae, ischemia, or inflammatory conditions.Marland Kitchen Psychiatric Judgement and insight Intact.. No evidence of depression, anxiety, or agitation.. General Notes: ulcerated area on the plantar aspect of her right foot at the area of the first metatarsal head with surrounding callus and macerated skin. Sharp debridement was done with a #3 curet and bleeding controlled with pressure Integumentary (Hair, Skin) No suspicious lesions. No crepitus or fluctuance. No peri-wound warmth or erythema. No masses.. Wound #1 status is Open. Original cause of wound was Gradually Appeared. The wound is located on the Right,Dorsal Metatarsal head first. The wound measures 0.2cm length x 0.4cm width x 0.1cm depth; 0.063cm^2 area and 0.006cm^3 volume. The wound is limited to skin breakdown. There is no tunneling or undermining noted. There is a large amount of serous drainage noted. The wound margin is flat and intact. There is large (67-100%) pink granulation within the wound bed. There is no necrotic tissue within the wound bed. The periwound skin appearance exhibited: Callus, Moist. The periwound skin appearance did not exhibit: Crepitus, Excoriation, Fluctuance, Friable, Induration, Localized Edema, Rash, Scarring, Dry/Scaly, Maceration, Atrophie  Blanche, Cyanosis, Ecchymosis, Hemosiderin Staining, Mottled, Pallor, Rubor, Erythema. Periwound temperature was noted as No Abnormality. The periwound has tenderness on [name] (  [id number]) palpation. Assessment Active Problems ICD-10 E11.621 - Type 2 diabetes mellitus with foot ulcer L97.512 - Non-pressure chronic ulcer of other part of right foot with fat layer exposed I89.0 - Lymphedema, not elsewhere classified E66.01 - Morbid (severe) obesity due to excess calories 40 year old morbidly obese patient with diabetes mellitus and a plantar ulcer in the region of the first metatarsal head. After review of recommended: 1. X-ray of the right foot 2. Prisma AG, and offloading felt and offloading shoes 3. She is very unsteady on her feet so total contact cast is not possible 4. Good control of her diabetes mellitus 5. regular visits to the wound center Procedures Wound #1 Wound #1 is a Diabetic Wound/Ulcer of the Lower Extremity located on the Right,Dorsal Metatarsal head first . There was a Skin/Subcutaneous Tissue Debridement ([id number]) debridement with total area of 0.08 sq cm performed by Evlyn Kanner, MD. with the following instrument(s): Curette to remove Viable and Non-Viable tissue/material including Fibrin/Slough, Skin, Callus, and Subcutaneous after achieving pain control using Lidocaine 4% Topical Solution. A time out was conducted at 09:01, prior to the start of the procedure. A Minimum amount of bleeding was controlled with Pressure. The procedure was tolerated well with a pain level of 0 throughout and a pain level of 0 following the procedure. Post Debridement Measurements: 0.2cm length x 0.4cm width x 0.1cm depth; 0.006cm^3 volume. Character of Wound/Ulcer Post Debridement is improved. Severity of Tissue Post Debridement is: Fat layer exposed. Post procedure Diagnosis Wound #1: Same as Pre-Procedure [name] ([id number]) Plan Wound Cleansing: Wound #1  Right,Dorsal Metatarsal head first: Clean wound with Normal Saline. May Shower, gently pat wound dry prior to applying new dressing. Anesthetic: Wound #1 Right,Dorsal Metatarsal head first: Topical Lidocaine 4% cream applied to wound bed prior to debridement Primary Wound Dressing: Wound #1 Right,Dorsal Metatarsal head first: Prisma Ag Secondary Dressing: Wound #1 Right,Dorsal Metatarsal head first: Gauze and Kerlix/Conform Dressing Change Frequency: Wound #1 Right,Dorsal Metatarsal head first: Change dressing every Day. Follow-up Appointments: Wound #1 Right,Dorsal Metatarsal head first: Return Appointment in 1 week. Edema Control: Wound #1 Right,Dorsal Metatarsal head first: Elevate legs to the level of the heart and pump ankles as often as possible Off-Loading: Wound #1 Right,Dorsal Metatarsal head first: Other: - felt Additional Orders / Instructions: Wound #1 Right,Dorsal Metatarsal head first: Increase protein intake. Radiology ordered were: X-ray, foot - right 40 year old morbidly obese patient with diabetes mellitus and a plantar ulcer in the region of the first metatarsal head. After review of recommended: 1. X-ray of the right foot 2. Prisma AG, and offloading felt and offloading shoes 3. She is very unsteady on her feet so total contact cast is not possible 4. Good control of her diabetes mellitus 5. regular visits to the wound center Electronic Signature(s) Brooke EMREY, [name] ([id number]) Signed: 07/23/2016 4:09:26 PM By: Evlyn Kanner MD, FACS Previous Signature: 07/23/2016 9:22:27 AM Version By: Evlyn Kanner MD, FACS Entered By: Evlyn Kanner on 07/23/2016 16:09:25 [name] ([id number]) -------------------------------------------------------------------------------- ROS/PFSH Details Patient Name: [name] 07/23/2016 8:00 Date of Service: AM Medical Record [id number] Number: Patient Account Number: [id number] 19-Nov-1975 (40 y.o. Treating RN:  Curtis Sites Date of Birth/Sex: Female) Other Clinician: Primary Care Physician: Sula Rumple Treating Rayel Santizo Referring Physician: Sula Rumple Physician/Extender: Weeks in Treatment: 0 Information Obtained From Patient Wound History Do you currently have one or more open woundso Yes How many open wounds do you currently haveo 2 Approximately how long have you had your woundso about  3 weeks How have you been treating your wound(s) until nowo mupirocin Has your wound(s) ever healed and then re-openedo No Have you had any lab work done in the past montho Yes Who ordered the lab work doneo PCP Have you tested positive for an antibiotic resistant organism (MRSA, VRE)o No Have you tested positive for osteomyelitis (bone infection)o No Have you had any tests for circulation on your legso No Have you had other problems associated with your woundso Swelling Psychiatric Complaints and Symptoms: Positive for: Anxiety Constitutional Symptoms (General Health) Complaints and Symptoms: No Complaints or Symptoms Eyes Complaints and Symptoms: No Complaints or Symptoms Ear/Nose/Mouth/Throat Complaints and Symptoms: No Complaints or Symptoms Hematologic/Lymphatic Complaints and Symptoms: No Complaints or Symptoms [name] ([id number]) Medical History: Positive for: Lymphedema Respiratory Complaints and Symptoms: No Complaints or Symptoms Cardiovascular Complaints and Symptoms: No Complaints or Symptoms Medical History: Positive for: Hypertension Gastrointestinal Complaints and Symptoms: No Complaints or Symptoms Medical History: Past Medical History Notes: diverticulitis Endocrine Medical History: Positive for: Type II Diabetes Time with diabetes: 2005 Treated with: Insulin, Oral agents Blood sugar tested every Day: Yes Tested : qd Genitourinary Complaints and Symptoms: No Complaints or Symptoms Immunological Complaints and Symptoms: No Complaints  or Symptoms Medical History: Past Medical History Notes: hidradenitis suppurativa Integumentary (Skin) [name] ([id number]) Complaints and Symptoms: No Complaints or Symptoms Musculoskeletal Complaints and Symptoms: No Complaints or Symptoms Medical History: Past Medical History Notes: fibromyalgia Neurologic Complaints and Symptoms: No Complaints or Symptoms Medical History: Positive for: Neuropathy Oncologic Complaints and Symptoms: No Complaints or Symptoms Medical History: Negative for: Received Chemotherapy; Received Radiation Immunizations Pneumococcal Vaccine: Received Pneumococcal Vaccination: No Immunization Notes: up to date Family and Social History Former smoker - quit 2013; Marital Status - Single; Alcohol Use: Never; Drug Use: No History; Caffeine Use: Moderate; Financial Concerns: No; Food, Clothing or Shelter Needs: No; Support System Lacking: No; Transportation Concerns: No; Advanced Directives: No; Patient does not want information on Advanced Directives Physician Affirmation I have reviewed and agree with the above information. Electronic Signature(s) Signed: 07/23/2016 4:08:26 PM By: Evlyn Kanner MD, FACS Signed: 07/23/2016 4:27:52 PM By: Curtis Sites Entered By: Evlyn Kanner on 07/23/2016 08:40:59 [name] ([id number]) -------------------------------------------------------------------------------- SuperBill Details Patient Name: [name] Date of Service: 07/23/2016 Medical Record Number: [id number] Patient Account Number: [id number] Date of Birth/Sex: 1975/10/12 (40 y.o. Female) Treating RN: Curtis Sites Primary Care Physician: Sula Rumple Other Clinician: Referring Physician: Sula Rumple Treating Physician/Extender: Rudene Re in Treatment: 0 Diagnosis Coding ICD-10 Codes Code Description E11.621 Type 2 diabetes mellitus with foot ulcer L97.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 right  foot with fat layer exposed I89.0 Lymphedema, not elsewhere classified E66.01 Morbid (severe) obesity due to excess calories Facility Procedures CPT4 Code Description: 62952841 99213 - WOUND CARE VISIT-LEV 3 EST PT Modifier: Quantity: 1 CPT4 Code Description: 32440102 11042 - DEB SUBQ TISSUE 20 SQ CM/< ICD-10 Description Diagnosis E11.621 Type 2 diabetes mellitus with foot ulcer L97.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 right foo I89.0 Lymphedema, not elsewhere classified  E66.01 Morbid (severe) obesity due to excess calories Modifier: t with fat la Quantity: 1 yer exposed Physician Procedures CPT4 Code Description: 7253664 40347 - WC PHYS LEVEL 4 - NEW PT ICD-10 Description Diagnosis E11.621 Type 2 diabetes mellitus with foot ulcer L97.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 right fo I89.0 Lymphedema, not elsewhere classified E66.01  Morbid (severe) obesity due to excess calories Modifier: 25 ot with fat lay  Quantity: 1 er exposed CPT4 Code Description: [id number] 11042 - WC PHYS SUBQ TISS 20 SQ CM ICD-10 Description Diagnosis E11.621 Type 2 diabetes mellitus with foot ulcer L97.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 right fo Brooke ROSE, Kaci ([id number]) Modifier: ot with fat lay Quantity: 1 er exposed Electronic Signature(s) Signed: 07/23/2016 4:09:33 PM By: Evlyn KannerBritto, Nayomi Tabron MD, FACS Previous Signature: 07/23/2016 9:22:47 AM Version By: Evlyn KannerBritto, Torie Towle MD, FACS Entered By: Evlyn KannerBritto, Lucee Brissett on 07/23/2016 16:09:33

## 2016-07-26 ENCOUNTER — Encounter: Payer: Self-pay | Admitting: Occupational Therapy

## 2016-07-26 NOTE — Therapy (Signed)
[facility] [address] [place], [place], [address] Phone: [phone]   Fax:  [phone]  Occupational Therapy Evaluation  Patient Details  Name: Brooke CoonKatie Duke Day MRN: [id number] Date of Birth: 08/31/1975 No Data Recorded  Encounter Date: 07/18/2016    Past Medical History:  Diagnosis Date  . Barrett's esophagus   . Bile-induced gastritis   . Depression   . Diabetes mellitus without complication (HCC)   . Diverticulitis   . Erosive esophagitis   . Gastropathy   . Hidradenitis suppurativa   . Hyperlipidemia   . Hypertension   . Neuropathy (HCC)   . Obesity   . Redundant colon     Past Surgical History:  Procedure Laterality Date  . ESOPHAGOGASTRODUODENOSCOPY (EGD) WITH PROPOFOL N/A 11/21/2015   Procedure: ESOPHAGOGASTRODUODENOSCOPY (EGD) WITH PROPOFOL;  Surgeon: Christena DeemMartin U Skulskie, MD;  [facility];  Service: Endoscopy;  Laterality: N/A;    There were no vitals filed for this visit.         [facility] - 07/26/16 0001      Assessment   Diagnosis Moderate, Stage II, BLE/ BLQ  lymphedema 2/2 morbid obesity   Prior Therapy no prior CDT; no compression     Precautions   Precautions Other (comment)  DM skin precautions     Home  Environment   Lives With --  estranged spouse, friend/roomate     Prior Function   Level of Independence Needs assistance with transfers;Needs assistance with homemaking;Needs assistance with ADLs;Needs assistance with gait   Comments housemate assists w/ all basic and some instrumental ADLs     Mobility   Mobility Status Needs assist     Observation/Other Assessments   Observations Some visible variscosities. Peau de orange w/ distended pores at underside of panis.      ROM / Strength   AROM / PROM / Strength --  ROM at all LE joints limited by body habitus and LE swelling          LYMPHEDEMA/ONCOLOGY QUESTIONNAIRE - 07/26/16 0825      What  other symptoms do you have   Are you Having Heaviness or Tightness Yes   Are you having Pain Yes   Are you having pitting edema No   Is it Hard or Difficult finding clothes that fit Yes   Stemmer Sign Yes     Lymphedema Stage   Stage STAGE 2 SPONTANEOUSLY IRREVERSIBLE     Lymphedema Assessments   Lymphedema Assessments Lower extremities     Right Lower Extremity Lymphedema   Other R>L, comparative limb volumetrics TBA at Rx visit 1. Estimate 20% R>L                OT Treatments/Exercises (OP) - 07/26/16 0001      Transfers   Transfers Sit to Stand   Sit to Stand 3: Mod assist     Manual Therapy   Manual Therapy Edema management                    OT Long Term Goals - 07/26/16 1403      Long Term Additional Goals   Additional Long Term Goals Yes               Plan - 07/26/16 1405    Clinical Impression Statement Pt presents with moderate, stage II, BLE lymphedema, R>L, 2/2 suspected venous insufficiency and morbid obesity. Pt reports onset "years ago" without known precipitating event.  Swelling and associated pain and sensory discomfort has worsened over time and no longer resolves with elevation. Leg swelling and pain limits ambulation, standing tolerance and functional mobility, including all transfers and bed mobility. Physical and sensory symptoms limit's Pt's ability to fit street shoes and lower body clothing. It limits her ability to perform many home management tasks, to participate in social, productive and leisure activities at home and in the community. It contributes significantly to decreased skin flexibility and AROM at knees, ankles and feet. Skilled Occupational Therapy for LE care is medically necessary to reduce uncontrolled swelling and associated pain/ discomfort, to decrease infection and falls risk, to improve knowledge and performance of LE self-care, to fit with appropriate compression garments/devices that are easy to don and  doff, and to limit further progression of this chronic, disabling condition. Intensive phase Complete Decongestive Therapy (CDT) will include manual lymph drainage (MLD), skin care, therapeutic exercise, compression therapy, and strong emphasis will be put on Pt and caregiver education for lymphedema self-care training. Without skilled Occupational Therapy for Intensive and Management phase Complete Decongestive Therapy (CDT) Lymphedema will progress and further functional decline is expected. Caregiver assistance is critical to success in this case. Without full and consistent caregiver assistance Pt's prognosis for improvement is guarded at best.        OT plan of care outlined above; however, as an [place] Medicaid recipient, she is eligible for one OT evaluation and 3 reimbursed treatment visits only per year. Pt will attend 3 allowed visits and [facility] in an effort to access  optimal number of treatment visits.    Rehab Potential Good   Clinical Impairments Affecting Rehab Potential body habitus   OT Frequency 3x / week   OT Duration 12 weeks   OT Treatment/Interventions Self-care/ADL training;Therapeutic exercise;Patient/family education;Manual Therapy;Energy conservation;Manual lymph drainage;DME and/or AE instruction;Compression bandaging;Therapeutic activities   Plan OT plan of care outlined above; however, as an [place] Medicaid recipient, she is eligible for one OT evaluation and 3 reimbursed treatment visits only per year. Pt will attend 3 allowed visits and [facility] in an effort to access  optimal number of treatment visits.    OT Home Exercise Plan Hosemate is in agreement w/ plan to assist Pt with all LE self care home program components, including simple self-MLD, skin care, compression wrapping and ther ex.   Recommended Other Services Fit with custom, BLE compression garments and HOS devices.       Patient will benefit from skilled therapeutic  intervention in order to improve the following deficits and impairments:  Abnormal gait, Decreased skin integrity, Decreased knowledge of precautions, Impaired flexibility, Decreased mobility, Difficulty walking, Obesity, Decreased range of motion, Increased edema, Pain  Visit Diagnosis: Lymphedema, not elsewhere classified - Plan: Ot plan of care cert/re-cert  Lymphedema of both lower extremities    Problem List There are no active problems to display for this patient.   Loel Dubonnetheresa Kamauri Kathol, MS, OTR/L, [facility] 07/26/16 2:25 PM  [place] [facility] [address] [place], [place], [address] Phone: [phone]   Fax:  [phone]  Name: Brooke CoonKatie Duke Day MRN: [id number] Date of Birth: 1975/12/15

## 2016-07-27 NOTE — Therapy (Signed)
[facility] [address] [place], [place], [address] Phone: [phone]   Fax:  [phone]  Occupational Therapy Evaluation  Patient Details  Name: [name] MRN: [id number] Date of Birth: Nov 15, 1975 No Data Recorded  Encounter Date: 07/18/2016    Past Medical History:  Diagnosis Date  . Barrett's esophagus   . Bile-induced gastritis   . Depression   . Diabetes mellitus without complication (HCC)   . Diverticulitis   . Erosive esophagitis   . Gastropathy   . Hidradenitis suppurativa   . Hyperlipidemia   . Hypertension   . Neuropathy (HCC)   . Obesity   . Redundant colon     Past Surgical History:  Procedure Laterality Date  . ESOPHAGOGASTRODUODENOSCOPY (EGD) WITH PROPOFOL N/A 11/21/2015   Procedure: ESOPHAGOGASTRODUODENOSCOPY (EGD) WITH PROPOFOL;  Surgeon: Christena DeemMartin U Skulskie, MD;  [facility];  Service: Endoscopy;  Laterality: N/A;    There were no vitals filed for this visit.           LYMPHEDEMA/ONCOLOGY QUESTIONNAIRE - 07/26/16 0825      What other symptoms do you have   Are you Having Heaviness or Tightness Yes   Are you having Pain Yes   Are you having pitting edema No   Is it Hard or Difficult finding clothes that fit Yes   Stemmer Sign Yes     Lymphedema Stage   Stage STAGE 2 SPONTANEOUSLY IRREVERSIBLE     Lymphedema Assessments   Lymphedema Assessments Lower extremities     Right Lower Extremity Lymphedema   Other R>L, comparative limb volumetrics TBA at Rx visit 1. Estimate 20% R>L                            OT Long Term Goals - 07/26/16 1403      Long Term Additional Goals   Additional Long Term Goals Yes               Plan - 07/26/16 1405    Clinical Impression Statement Pt presents with moderate, stage II, BLE lymphedema, R>L, 2/2 suspected venous insufficiency and morbid obesity. Pt reports onset "years ago" without known  precipitating event. Swelling and associated pain and sensory discomfort has worsened over time and no longer resolves with elevation. Leg swelling and pain limits ambulation, standing tolerance and functional mobility, including all transfers and bed mobility. Physical and sensory symptoms limit's Pt's ability to fit street shoes and lower body clothing. It limits her ability to perform many home management tasks, to participate in social, productive and leisure activities at home and in the community. It contributes significantly to decreased skin flexibility and AROM at knees, ankles and feet. Skilled Occupational Therapy for LE care is medically necessary to reduce uncontrolled swelling and associated pain/ discomfort, to decrease infection and falls risk, to improve knowledge and performance of LE self-care, to fit with appropriate compression garments/devices that are easy to don and doff, and to limit further progression of this chronic, disabling condition. Intensive phase Complete Decongestive Therapy (CDT) will include manual lymph drainage (MLD), skin care, therapeutic exercise, compression therapy, and strong emphasis will be put on Pt and caregiver education for lymphedema self-care training. Without skilled Occupational Therapy for Intensive and Management phase Complete Decongestive Therapy (CDT) Lymphedema will progress and further functional decline is expected. Caregiver assistance is critical to success in this case. Without full and consistent caregiver assistance Pt's prognosis for  improvement is guarded at best.        OT plan of care outlined above; however, as an [place] Medicaid recipient, she is eligible for one OT evaluation and 3 reimbursed treatment visits only per year. Pt will attend 3 allowed visits and apply [facility] ASAP in an effort to access  optimal number of treatment visits.    Rehab Potential Good   Clinical Impairments Affecting Rehab Potential body habitus   OT  Frequency 3x / week   OT Duration 12 weeks   OT Treatment/Interventions Self-care/ADL training;Therapeutic exercise;Patient/family education;Manual Therapy;Energy conservation;Manual lymph drainage;DME and/or AE instruction;Compression bandaging;Therapeutic activities   Plan OT plan of care outlined above; however, as an [place] Medicaid recipient, she is eligible for one OT evaluation and 3 reimbursed treatment visits only per year. Pt will attend 3 allowed visits and [facility] in an effort to access  optimal number of treatment visits.    OT Home Exercise Plan Hosemate is in agreement w/ plan to assist Pt with all LE self care home program components, including simple self-MLD, skin care, compression wrapping and ther ex.   Recommended Other Services Fit with custom, BLE compression garments and HOS devices.       Patient will benefit from skilled therapeutic intervention in order to improve the following deficits and impairments:  Abnormal gait, Decreased skin integrity, Decreased knowledge of precautions, Impaired flexibility, Decreased mobility, Difficulty walking, Obesity, Decreased range of motion, Increased edema, Pain  Visit Diagnosis: Lymphedema, not elsewhere classified - Plan: Ot plan of care cert/re-cert  Lymphedema of both lower extremities    Problem List There are no active problems to display for this patient.   [name] 07/27/2016, 3:27 PM  [facility] [address] [place], [place], [address] Phone: [phone]   Fax:  [phone]  Name: [name] MRN: [id number] Date of Birth: Jun 02, 1976

## 2016-07-27 NOTE — Therapy (Signed)
[facility] [address] [place], [place], [address] Phone: [phone]   Fax:  [phone]  Occupational Therapy Evaluation  Patient Details  Name: Brooke CoonKatie Duke Day MRN: [id number] Date of Birth: Nov 15, 1975 No Data Recorded  Encounter Date: 07/18/2016    Past Medical History:  Diagnosis Date  . Barrett's esophagus   . Bile-induced gastritis   . Depression   . Diabetes mellitus without complication (HCC)   . Diverticulitis   . Erosive esophagitis   . Gastropathy   . Hidradenitis suppurativa   . Hyperlipidemia   . Hypertension   . Neuropathy (HCC)   . Obesity   . Redundant colon     Past Surgical History:  Procedure Laterality Date  . ESOPHAGOGASTRODUODENOSCOPY (EGD) WITH PROPOFOL N/A 11/21/2015   Procedure: ESOPHAGOGASTRODUODENOSCOPY (EGD) WITH PROPOFOL;  Surgeon: Christena DeemMartin U Skulskie, MD;  [facility];  Service: Endoscopy;  Laterality: N/A;    There were no vitals filed for this visit.           LYMPHEDEMA/ONCOLOGY QUESTIONNAIRE - 07/26/16 0825      What other symptoms do you have   Are you Having Heaviness or Tightness Yes   Are you having Pain Yes   Are you having pitting edema No   Is it Hard or Difficult finding clothes that fit Yes   Stemmer Sign Yes     Lymphedema Stage   Stage STAGE 2 SPONTANEOUSLY IRREVERSIBLE     Lymphedema Assessments   Lymphedema Assessments Lower extremities     Right Lower Extremity Lymphedema   Other R>L, comparative limb volumetrics TBA at Rx visit 1. Estimate 20% R>L                            OT Long Term Goals - 07/26/16 1403      Long Term Additional Goals   Additional Long Term Goals Yes               Plan - 07/26/16 1405    Clinical Impression Statement Pt presents with moderate, stage II, BLE lymphedema, R>L, 2/2 suspected venous insufficiency and morbid obesity. Pt reports onset "years ago" without known  precipitating event. Swelling and associated pain and sensory discomfort has worsened over time and no longer resolves with elevation. Leg swelling and pain limits ambulation, standing tolerance and functional mobility, including all transfers and bed mobility. Physical and sensory symptoms limit's Pt's ability to fit street shoes and lower body clothing. It limits her ability to perform many home management tasks, to participate in social, productive and leisure activities at home and in the community. It contributes significantly to decreased skin flexibility and AROM at knees, ankles and feet. Skilled Occupational Therapy for LE care is medically necessary to reduce uncontrolled swelling and associated pain/ discomfort, to decrease infection and falls risk, to improve knowledge and performance of LE self-care, to fit with appropriate compression garments/devices that are easy to don and doff, and to limit further progression of this chronic, disabling condition. Intensive phase Complete Decongestive Therapy (CDT) will include manual lymph drainage (MLD), skin care, therapeutic exercise, compression therapy, and strong emphasis will be put on Pt and caregiver education for lymphedema self-care training. Without skilled Occupational Therapy for Intensive and Management phase Complete Decongestive Therapy (CDT) Lymphedema will progress and further functional decline is expected. Caregiver assistance is critical to success in this case. Without full and consistent caregiver assistance Pt's prognosis for  improvement is guarded at best.        OT plan of care outlined above; however, as an [place] Medicaid recipient, she is eligible for one OT evaluation and 3 reimbursed treatment visits only per year. Pt will attend 3 allowed visits and apply [facility] ASAP in an effort to access  optimal number of treatment visits.    Rehab Potential Good   Clinical Impairments Affecting Rehab Potential body habitus   OT  Frequency 3x / week   OT Duration 12 weeks   OT Treatment/Interventions Self-care/ADL training;Therapeutic exercise;Patient/family education;Manual Therapy;Energy conservation;Manual lymph drainage;DME and/or AE instruction;Compression bandaging;Therapeutic activities   Plan OT plan of care outlined above; however, as an [place] Medicaid recipient, she is eligible for one OT evaluation and 3 reimbursed treatment visits only per year. Pt will attend 3 allowed visits and apply [facility] ASAP in an effort to access  optimal number of treatment visits.    OT Home Exercise Plan Hosemate is in agreement w/ plan to assist Pt with all LE self care home program components, including simple self-MLD, skin care, compression wrapping and ther ex.   Recommended Other Services Fit with custom, BLE compression garments and HOS devices.       Patient will benefit from skilled therapeutic intervention in order to improve the following deficits and impairments:  Abnormal gait, Decreased skin integrity, Decreased knowledge of precautions, Impaired flexibility, Decreased mobility, Difficulty walking, Obesity, Decreased range of motion, Increased edema, Pain  Visit Diagnosis: Lymphedema, not elsewhere classified - Plan: Ot plan of care cert/re-cert  Lymphedema of both lower extremities    Problem List There are no active problems to display for this patient.   Judithann Saugerheresa L Cherisa Brucker 07/27/2016, 3:28 PM  [place] [facility] [address] [place], [place], [address] Phone: [phone]   Fax:  [phone]  Name: Brooke CoonKatie Duke Day MRN: [id number] Date of Birth: 1976/01/07

## 2016-07-27 NOTE — Therapy (Signed)
[facility] [address] [place], [place], [address] Phone: [phone]   Fax:  [phone]  Occupational Therapy Evaluation  Patient Details  Name: [name] MRN: [id number] Date of Birth: Nov 15, 1975 No Data Recorded  Encounter Date: 07/18/2016    Past Medical History:  Diagnosis Date  . Barrett's esophagus   . Bile-induced gastritis   . Depression   . Diabetes mellitus without complication (HCC)   . Diverticulitis   . Erosive esophagitis   . Gastropathy   . Hidradenitis suppurativa   . Hyperlipidemia   . Hypertension   . Neuropathy (HCC)   . Obesity   . Redundant colon     Past Surgical History:  Procedure Laterality Date  . ESOPHAGOGASTRODUODENOSCOPY (EGD) WITH PROPOFOL N/A 11/21/2015   Procedure: ESOPHAGOGASTRODUODENOSCOPY (EGD) WITH PROPOFOL;  Surgeon: Christena DeemMartin U Skulskie, MD;  [facility];  Service: Endoscopy;  Laterality: N/A;    There were no vitals filed for this visit.           LYMPHEDEMA/ONCOLOGY QUESTIONNAIRE - 07/26/16 0825      What other symptoms do you have   Are you Having Heaviness or Tightness Yes   Are you having Pain Yes   Are you having pitting edema No   Is it Hard or Difficult finding clothes that fit Yes   Stemmer Sign Yes     Lymphedema Stage   Stage STAGE 2 SPONTANEOUSLY IRREVERSIBLE     Lymphedema Assessments   Lymphedema Assessments Lower extremities     Right Lower Extremity Lymphedema   Other R>L, comparative limb volumetrics TBA at Rx visit 1. Estimate 20% R>L                            OT Long Term Goals - 07/26/16 1403      Long Term Additional Goals   Additional Long Term Goals Yes               Plan - 07/26/16 1405    Clinical Impression Statement Pt presents with moderate, stage II, BLE lymphedema, R>L, 2/2 suspected venous insufficiency and morbid obesity. Pt reports onset "years ago" without known  precipitating event. Swelling and associated pain and sensory discomfort has worsened over time and no longer resolves with elevation. Leg swelling and pain limits ambulation, standing tolerance and functional mobility, including all transfers and bed mobility. Physical and sensory symptoms limit's Pt's ability to fit street shoes and lower body clothing. It limits her ability to perform many home management tasks, to participate in social, productive and leisure activities at home and in the community. It contributes significantly to decreased skin flexibility and AROM at knees, ankles and feet. Skilled Occupational Therapy for LE care is medically necessary to reduce uncontrolled swelling and associated pain/ discomfort, to decrease infection and falls risk, to improve knowledge and performance of LE self-care, to fit with appropriate compression garments/devices that are easy to don and doff, and to limit further progression of this chronic, disabling condition. Intensive phase Complete Decongestive Therapy (CDT) will include manual lymph drainage (MLD), skin care, therapeutic exercise, compression therapy, and strong emphasis will be put on Pt and caregiver education for lymphedema self-care training. Without skilled Occupational Therapy for Intensive and Management phase Complete Decongestive Therapy (CDT) Lymphedema will progress and further functional decline is expected. Caregiver assistance is critical to success in this case. Without full and consistent caregiver assistance Pt's prognosis for  improvement is guarded at best.        OT plan of care outlined above; however, as an [place] Medicaid recipient, she is eligible for one OT evaluation and 3 reimbursed treatment visits only per year. Pt will attend 3 allowed visits and [facility] in an effort to access  optimal number of treatment visits.    Rehab Potential Good   Clinical Impairments Affecting Rehab Potential body habitus   OT  Frequency 3x / week   OT Duration 12 weeks   OT Treatment/Interventions Self-care/ADL training;Therapeutic exercise;Patient/family education;Manual Therapy;Energy conservation;Manual lymph drainage;DME and/or AE instruction;Compression bandaging;Therapeutic activities   Plan OT plan of care outlined above; however, as an [place] Medicaid recipient, she is eligible for one OT evaluation and 3 reimbursed treatment visits only per year. Pt will attend 3 allowed visits and apply [facility] in an effort to access  optimal number of treatment visits.    OT Home Exercise Plan Hosemate is in agreement w/ plan to assist Pt with all LE self care home program components, including simple self-MLD, skin care, compression wrapping and ther ex.   Recommended Other Services Fit with custom, BLE compression garments and HOS devices.       Patient will benefit from skilled therapeutic intervention in order to improve the following deficits and impairments:  Abnormal gait, Decreased skin integrity, Decreased knowledge of precautions, Impaired flexibility, Decreased mobility, Difficulty walking, Obesity, Decreased range of motion, Increased edema, Pain  Visit Diagnosis: Lymphedema, not elsewhere classified - Plan: Ot plan of care cert/re-cert  Lymphedema of both lower extremities    Problem List There are no active problems to display for this patient.   Judithann Saugerheresa L Inetha Maret 07/27/2016, 3:26 PM  [place] [facility] [address] [place], [place], [address] Phone: [phone]   Fax:  [phone]  Name: [name] MRN: [id number] Date of Birth: 1976-02-07

## 2016-07-27 NOTE — Therapy (Signed)
[facility] [address] [place], [place], [address] Phone: [phone]   Fax:  [phone]  Occupational Therapy Evaluation  Patient Details  Name: Brooke CoonKatie Duke Day MRN: [id number] Date of Birth: Jun 03, 1976 No Data Recorded  Encounter Date: 07/18/2016    Past Medical History:  Diagnosis Date  . Barrett's esophagus   . Bile-induced gastritis   . Depression   . Diabetes mellitus without complication (HCC)   . Diverticulitis   . Erosive esophagitis   . Gastropathy   . Hidradenitis suppurativa   . Hyperlipidemia   . Hypertension   . Neuropathy (HCC)   . Obesity   . Redundant colon     Past Surgical History:  Procedure Laterality Date  . ESOPHAGOGASTRODUODENOSCOPY (EGD) WITH PROPOFOL N/A 11/21/2015   Procedure: ESOPHAGOGASTRODUODENOSCOPY (EGD) WITH PROPOFOL;  Surgeon: Christena DeemMartin U Skulskie, MD;  [facility];  Service: Endoscopy;  Laterality: N/A;    There were no vitals filed for this visit.           LYMPHEDEMA/ONCOLOGY QUESTIONNAIRE - 07/26/16 0825      What other symptoms do you have   Are you Having Heaviness or Tightness Yes   Are you having Pain Yes   Are you having pitting edema No   Is it Hard or Difficult finding clothes that fit Yes   Stemmer Sign Yes     Lymphedema Stage   Stage STAGE 2 SPONTANEOUSLY IRREVERSIBLE     Lymphedema Assessments   Lymphedema Assessments Lower extremities     Right Lower Extremity Lymphedema   Other R>L, comparative limb volumetrics TBA at Rx visit 1. Estimate 20% R>L                            OT Long Term Goals - 07/26/16 1403      OT LONG TERM GOAL #1   Title Lymphedema (LE) management/ self-care: Pt able to apply multi layered, gradient compression wraps independently using proper techniques with max caregiver assitance within 2 weeks to achieve optimal limb volume reduction.   Baseline dependent   Time 2   Period Weeks   Status New     OT LONG TERM GOAL #2   Title Lymphedema (LE) management/ self-care:  Pt to achieve at least 10% LLE limb volume reductions bilaterally during Intensive CDT to limit LE progression, to reduce pain, and to improve safe ambulation and functional mobility.   Baseline dependent   Time 12   Period Weeks     OT LONG TERM GOAL #3   Title Lymphedema (LE) management/ self-care:  Pt >/= 85 % compliant with all daily, LE self-care protocols for home program w/ needed level of caregiver assistance , including simple self-manual lymphatic drainage (MLD), skin care, lymphatic pumping the ex, skin care, and donning/ doffing compression wraps and garments o limit LE progression and further functional decline.     Baseline dependent   Time 12   Period Weeks   Status New     OT LONG TERM GOAL #4   Title Lymphedema (LE) management/ self-care:  Pt to tolerate daily compression wraps, garments and devices in keeping w/ prescribed wear regime within 1 week of issue date to progress and retain clinical and functional gains and to limit LE progression.   Baseline dependent   Time 12   Period Weeks   Status New     OT LONG TERM GOAL #5  Title Lymphedema (LE) management/ self-care:  During Management Phase CDT Pt to sustain current limb volumes within 5%, and all other clinical gains achieved during OT treatment with needed level of caregiver assistance to limit LE progression, infection risk and further functional decline.   Baseline dependent   Period Months   Status New     Long Term Additional Goals   Additional Long Term Goals Yes               Plan - 07/26/16 1405    Clinical Impression Statement Pt presents with moderate, stage II, BLE lymphedema, R>L, 2/2 suspected venous insufficiency and morbid obesity. Pt reports onset "years ago" without known precipitating event. Swelling and associated pain and sensory discomfort has worsened over time and no longer resolves with  elevation. Leg swelling and pain limits ambulation, standing tolerance and functional mobility, including all transfers and bed mobility. Physical and sensory symptoms limit's Pt's ability to fit street shoes and lower body clothing. It limits her ability to perform many home management tasks, to participate in social, productive and leisure activities at home and in the community. It contributes significantly to decreased skin flexibility and AROM at knees, ankles and feet. Skilled Occupational Therapy for LE care is medically necessary to reduce uncontrolled swelling and associated pain/ discomfort, to decrease infection and falls risk, to improve knowledge and performance of LE self-care, to fit with appropriate compression garments/devices that are easy to don and doff, and to limit further progression of this chronic, disabling condition. Intensive phase Complete Decongestive Therapy (CDT) will include manual lymph drainage (MLD), skin care, therapeutic exercise, compression therapy, and strong emphasis will be put on Pt and caregiver education for lymphedema self-care training. Without skilled Occupational Therapy for Intensive and Management phase Complete Decongestive Therapy (CDT) Lymphedema will progress and further functional decline is expected. Caregiver assistance is critical to success in this case. Without full and consistent caregiver assistance Pt's prognosis for improvement is guarded at best.        OT plan of care outlined above; however, as an [place] Medicaid recipient, she is eligible for one OT evaluation and 3 reimbursed treatment visits only per year. Pt will attend 3 allowed visits and apply for [facility] Care ASAP in an effort to access  optimal number of treatment visits.    Rehab Potential Good   Clinical Impairments Affecting Rehab Potential body habitus   OT Frequency 3x / week   OT Duration 12 weeks   OT Treatment/Interventions Self-care/ADL training;Therapeutic  exercise;Patient/family education;Manual Therapy;Energy conservation;Manual lymph drainage;DME and/or AE instruction;Compression bandaging;Therapeutic activities   Plan OT plan of care outlined above; however, as an [place] Medicaid recipient, she is eligible for one OT evaluation and 3 reimbursed treatment visits only per year. Pt will attend 3 allowed visits and apply [facility] in an effort to access  optimal number of treatment visits.    OT Home Exercise Plan Hosemate is in agreement w/ plan to assist Pt with all LE self care home program components, including simple self-MLD, skin care, compression wrapping and ther ex.   Recommended Other Services Fit with custom, BLE compression garments and HOS devices.       Patient will benefit from skilled therapeutic intervention in order to improve the following deficits and impairments:  Abnormal gait, Decreased skin integrity, Decreased knowledge of precautions, Impaired flexibility, Decreased mobility, Difficulty walking, Obesity, Decreased range of motion, Increased edema, Pain  Visit Diagnosis: Lymphedema, not elsewhere classified - Plan:  Ot plan of care cert/re-cert  Lymphedema of both lower extremities    Problem List There are no active problems to display for this patient.  Loel Dubonnet, MS, OTR/L, [facility] 07/27/16 3:30 PM  [facility] MAIN [facility] SERVICES [address] [place], [place], [address] Phone: [phone]   Fax:  [phone]  Name: Brytani Voth MRN: [id number] Date of Birth: 10/23/1975

## 2016-08-02 ENCOUNTER — Ambulatory Visit: Payer: 59 | Admitting: Surgery

## 2016-08-17 ENCOUNTER — Ambulatory Visit: Payer: Medicaid Other | Attending: Family Medicine | Admitting: Occupational Therapy

## 2016-08-17 DIAGNOSIS — I890 Lymphedema, not elsewhere classified: Secondary | ICD-10-CM

## 2016-08-17 NOTE — Patient Instructions (Signed)
08/17/2016: Practice applying compression wraps as instructed during visit interval. Use printed directions   As a guide. Bring all materials to next visit. Call if you need assistance.  LE instructions and precautions as established- see initial eval.

## 2016-08-17 NOTE — Therapy (Signed)
[facility] MAIN [facility] SERVICES [address] [place], [place], [address] Phone: [phone]   Fax:  [phone]  Occupational Therapy Treatment  Patient Details  Name: [name] MRN: [id number] Date of Birth: Oct 23, 1975 No Data Recorded  Encounter Date: 08/17/2016      OT End of Session - 08/17/16 1635    Visit Number 2   Number of Visits 4   Date for OT Re-Evaluation 10/16/16   OT Start Time 0805   OT Stop Time 0910   OT Time Calculation (min) 65 min   Activity Tolerance Patient tolerated treatment well;Treatment limited secondary to medical complications (Comment)  Pt limited by body habitus and pain- impaired transfers, ambulation, and bed mobility,   Behavior During Therapy [facility] for tasks assessed/performed      Past Medical History:  Diagnosis Date  . Barrett's esophagus   . Bile-induced gastritis   . Depression   . Diabetes mellitus without complication (HCC)   . Diverticulitis   . Erosive esophagitis   . Gastropathy   . Hidradenitis suppurativa   . Hyperlipidemia   . Hypertension   . Neuropathy (HCC)   . Obesity   . Redundant colon     Past Surgical History:  Procedure Laterality Date  . ESOPHAGOGASTRODUODENOSCOPY (EGD) WITH PROPOFOL N/A 11/21/2015   Procedure: ESOPHAGOGASTRODUODENOSCOPY (EGD) WITH PROPOFOL;  Surgeon: Christena Deem, MD;  [facility];  Service: Endoscopy;  Laterality: N/A;    There were no vitals filed for this visit.      Subjective Assessment - 08/17/16 1628    Subjective  Pt presents for OT treatment visit 1 of 3 to address BLE lymphedema (LE). Pt is accompanied by [name]. Pt reports she submitted completed [facility] application but has not heard back from the financial office.   Pertinent History Obesity,Anxiety, depression, chronic lower back, neck, leg and foot pain, fibromyalgia, hidradenitis suppurativa, HTN, Type II Diabetes,OSA ( doesn't use cPap,  periferal neuropathy-feet,   Limitations BLE swelling and pain, R>L. Mod-max assist w/ all basic and some instrumental ADLs, difficulty walking, mod-max assist w/ all transfers, limited bed mobility, decreased standing and sitting tolerance, open wound on bottom of R foot - DM skin precautions, decreased  sensation hands and feet, hx pressure ulcers   Patient Stated Goals get the swelling down in my legs and keep it down.   Currently in Pain? Yes  leg and abdomen pain unchanged since initial eval   Pain Onset More than a month ago   Pain Onset More than a month ago             LYMPHEDEMA/ONCOLOGY QUESTIONNAIRE - 08/17/16 1631      Right Lower Extremity Lymphedema   Other RLE below knee (A_D) limb volume measures 9234.08 ml.   Other Limb volume differential (LVD) measures 20.25%, R>L     Left Lower Extremity Lymphedema   Other LLE A-D limb volume =  7363.86 ml                 OT Treatments/Exercises (OP) - 08/17/16 0001      ADLs   ADL Education Given Yes     Manual Therapy   Manual Therapy Edema management;Manual Lymphatic Drainage (MLD);Compression Bandaging;Other (comment)   Manual therapy comments completed BLE comparative limb volumetrics                OT Education - 08/17/16 1633    Education provided Yes  Education Details Pt and caregiver edu ongoing throughout session to explain results of initial limb volumetrics and to teach A-D gradient compression wraping using short stretch wraps over stockinett and Rosidal form.   Person(s) Educated Patient;Other (comment)   Methods Explanation;Demonstration;Tactile cues;Verbal cues;Handout   Comprehension Verbalized understanding;Tactile cues required;Need further instruction;Verbal cues required             OT Long Term Goals - 07/26/16 1403      OT LONG TERM GOAL #1   Title Lymphedema (LE) management/ self-care: Pt able to apply multi layered, gradient compression wraps independently using  proper techniques with max caregiver assitance within 2 weeks to achieve optimal limb volume reduction.   Baseline dependent   Time 2   Period Weeks   Status New     OT LONG TERM GOAL #2   Title Lymphedema (LE) management/ self-care:  Pt to achieve at least 10% LLE limb volume reductions bilaterally during Intensive CDT to limit LE progression, to reduce pain, and to improve safe ambulation and functional mobility.   Baseline dependent   Time 12   Period Weeks     OT LONG TERM GOAL #3   Title Lymphedema (LE) management/ self-care:  Pt >/= 85 % compliant with all daily, LE self-care protocols for home program w/ needed level of caregiver assistance , including simple self-manual lymphatic drainage (MLD), skin care, lymphatic pumping the ex, skin care, and donning/ doffing compression wraps and garments o limit LE progression and further functional decline.     Baseline dependent   Time 12   Period Weeks   Status New     OT LONG TERM GOAL #4   Title Lymphedema (LE) management/ self-care:  Pt to tolerate daily compression wraps, garments and devices in keeping w/ prescribed wear regime within 1 week of issue date to progress and retain clinical and functional gains and to limit LE progression.   Baseline dependent   Time 12   Period Weeks   Status New     OT LONG TERM GOAL #5   Title Lymphedema (LE) management/ self-care:  During Management Phase CDT Pt to sustain current limb volumes within 5%, and all other clinical gains achieved during OT treatment with needed level of caregiver assistance to limit LE progression, infection risk and further functional decline.   Baseline dependent   Period Months   Status New     Long Term Additional Goals   Additional Long Term Goals Yes               Plan - 08/17/16 1637    Clinical Impression Statement By end of session Pt able to apply multi layer, below knee (A-D) , RLE gradient compression wraps with maximum caregiver assistance.  Pt and Caregiver will practice compression wrapping during visit interval using printed handout and will call PRN. BLE comparative limb volume differential (LVD) measures 20.25%, R>L today. Cont as per POC.   Rehab Potential Good   Clinical Impairments Affecting Rehab Potential body habitus   OT Frequency 3x / week   OT Duration 12 weeks   OT Treatment/Interventions Self-care/ADL training;Therapeutic exercise;Patient/family education;Manual Therapy;Energy conservation;Manual lymph drainage;DME and/or AE instruction;Compression bandaging;Therapeutic activities   OT Home Exercise Plan Hosemate is in agreement w/ plan to assist Pt with all LE self care home program components, including simple self-MLD, skin care, compression wrapping and ther ex.      Patient will benefit from skilled therapeutic intervention in order to improve the following deficits and  impairments:  Abnormal gait, Decreased skin integrity, Decreased knowledge of precautions, Impaired flexibility, Decreased mobility, Difficulty walking, Obesity, Decreased range of motion, Increased edema, Pain  Visit Diagnosis: Lymphedema, not elsewhere classified    Problem List There are no active problems to display for this patient.   Loel Dubonnetheresa Sherrice Creekmore, MS, OTR/L, [facility] 08/17/16 4:40 PM  [facility] [address] [place], [place], [address] Phone: [phone]   Fax:  [phone]  Name: [name] MRN: [id number] Date of Birth: November 01, 1975

## 2016-09-04 ENCOUNTER — Ambulatory Visit: Payer: Medicaid Other | Admitting: Occupational Therapy

## 2016-09-04 DIAGNOSIS — I890 Lymphedema, not elsewhere classified: Secondary | ICD-10-CM | POA: Diagnosis not present

## 2016-09-04 NOTE — Patient Instructions (Signed)
LE instructions and precautions as established- see initial eval.   

## 2016-09-04 NOTE — Therapy (Signed)
[facility] MAIN [facility] SERVICES [address] [place], [place], [address] Phone: [phone]   Fax:  [phone]  Occupational Therapy Treatment  Patient Details  Name: [name] MRN: [id number] Date of Birth: 1976-02-28 No Data Recorded  Encounter Date: 09/04/2016      OT End of Session - 09/04/16 1051    Visit Number 3   Number of Visits 4   Date for OT Re-Evaluation 10/16/16   OT Start Time 0835   OT Stop Time 1010   OT Time Calculation (min) 95 min   Activity Tolerance Patient tolerated treatment well;Treatment limited secondary to medical complications (Comment)  Pt limited by body habitus and pain- impaired transfers, ambulation, and bed mobility,   Behavior During Therapy [facility] for tasks assessed/performed      Past Medical History:  Diagnosis Date  . Barrett's esophagus   . Bile-induced gastritis   . Depression   . Diabetes mellitus without complication (HCC)   . Diverticulitis   . Erosive esophagitis   . Gastropathy   . Hidradenitis suppurativa   . Hyperlipidemia   . Hypertension   . Neuropathy (HCC)   . Obesity   . Redundant colon     Past Surgical History:  Procedure Laterality Date  . ESOPHAGOGASTRODUODENOSCOPY (EGD) WITH PROPOFOL N/A 11/21/2015   Procedure: ESOPHAGOGASTRODUODENOSCOPY (EGD) WITH PROPOFOL;  Surgeon: Christena Deem, MD;  Location: [facility] ENDOSCOPY;  Service: Endoscopy;  Laterality: N/A;    There were no vitals filed for this visit.      Subjective Assessment - 09/04/16 1046    Subjective  Pt presents for OT treatment visit 2 of 3 to address BLE lymphedema (LE). Pt is accompanied by [name], and another friend , Aneta Mins. Pt and Derrick report that they didn't do very well with compression wrapping during visit interval. Pt requests another session for instruction. Pt reports bariatric surgery is pending for March 2018. Pt tells me she has not yet heard from [facility] re her  appliication status. She agrees to contact them directly and request status update.   Pertinent History Obesity,Anxiety, depression, chronic lower back, neck, leg and foot pain, fibromyalgia, hidradenitis suppurativa, HTN, Type II Diabetes,OSA ( doesn't use cPap, periferal neuropathy-feet,   Limitations BLE swelling and pain, R>L. Mod-max assist w/ all basic and some instrumental ADLs, difficulty walking, mod-max assist w/ all transfers, limited bed mobility, decreased standing and sitting tolerance, open wound on bottom of R foot - DM skin precautions, decreased  sensation hands and feet, hx pressure ulcers   Patient Stated Goals get the swelling down in my legs and keep it down.   Currently in Pain? No/denies   Pain Onset More than a month ago   Pain Onset More than a month ago                      OT Treatments/Exercises (OP) - 09/04/16 0001      ADLs   ADL Education Given Yes     Manual Therapy   Manual Therapy Edema management;Compression Bandaging                OT Education - 09/04/16 1050    Education provided Yes   Education Details Pt and caregiver edu for LE self care w/ emphasis on compression wrapping and lymphatic pumping ther ex.   Person(s) Educated Patient;Other (comment)   Methods Demonstration;Verbal cues;Explanation;Tactile cues;Handout   Comprehension Verbalized understanding;Returned demonstration;Verbal cues required;Tactile cues  required;Need further instruction             OT Long Term Goals - 07/26/16 1403      OT LONG TERM GOAL #1   Title Lymphedema (LE) management/ self-care: Pt able to apply multi layered, gradient compression wraps independently using proper techniques with max caregiver assitance within 2 weeks to achieve optimal limb volume reduction.   Baseline dependent   Time 2   Period Weeks   Status New     OT LONG TERM GOAL #2   Title Lymphedema (LE) management/ self-care:  Pt to achieve at least 10% LLE limb  volume reductions bilaterally during Intensive CDT to limit LE progression, to reduce pain, and to improve safe ambulation and functional mobility.   Baseline dependent   Time 12   Period Weeks     OT LONG TERM GOAL #3   Title Lymphedema (LE) management/ self-care:  Pt >/= 85 % compliant with all daily, LE self-care protocols for home program w/ needed level of caregiver assistance , including simple self-manual lymphatic drainage (MLD), skin care, lymphatic pumping the ex, skin care, and donning/ doffing compression wraps and garments o limit LE progression and further functional decline.     Baseline dependent   Time 12   Period Weeks   Status New     OT LONG TERM GOAL #4   Title Lymphedema (LE) management/ self-care:  Pt to tolerate daily compression wraps, garments and devices in keeping w/ prescribed wear regime within 1 week of issue date to progress and retain clinical and functional gains and to limit LE progression.   Baseline dependent   Time 12   Period Weeks   Status New     OT LONG TERM GOAL #5   Title Lymphedema (LE) management/ self-care:  During Management Phase CDT Pt to sustain current limb volumes within 5%, and all other clinical gains achieved during OT treatment with needed level of caregiver assistance to limit LE progression, infection risk and further functional decline.   Baseline dependent   Period Months   Status New     Long Term Additional Goals   Additional Long Term Goals Yes               Plan - 09/04/16 1052    Clinical Impression Statement No change in Pt condidtion compared with last visit. Pt and caregiver/friend were not able to apply compression wraps effectively. By end of session caregiver was able to apply wtraps using proper gradient techniques with minimal cuing. Pt made video on her phone for reference. She was able to provide feedback to cue helper re tension  and  spacing of layers. Pt verbalized understanding of ther ex. Ptt  instructed to perform 1 set of 10 reps for each lymphatic pumping ther ex in order , 2 x daily. Pt instructed to practice simple self MLD from handout 1 x daily, and remain wrapped 23/7 until next visit.   Rehab Potential Good   Clinical Impairments Affecting Rehab Potential body habitus   OT Frequency 3x / week   OT Duration 12 weeks   OT Treatment/Interventions Self-care/ADL training;Therapeutic exercise;Patient/family education;Manual Therapy;Energy conservation;Manual lymph drainage;DME and/or AE instruction;Compression bandaging;Therapeutic activities   OT Home Exercise Plan Hosemate is in agreement w/ plan to assist Pt with all LE self care home program components, including simple self-MLD, skin care, compression wrapping and ther ex.      Patient will benefit from skilled therapeutic intervention in order to improve the  following deficits and impairments:  Abnormal gait, Decreased skin integrity, Decreased knowledge of precautions, Impaired flexibility, Decreased mobility, Difficulty walking, Obesity, Decreased range of motion, Increased edema, Pain  Visit Diagnosis: Lymphedema, not elsewhere classified    Problem List There are no active problems to display for this patient.   Loel Dubonnetheresa Tahirih Lair, MS, OTR/L, [facility] Center[facility]T-LANA 09/04/16 10:58 AM  [facility] [address] [place], [place], [address] Phone: [phone]   Fax:  [phone]  Name: [name] MRN: [id number] Date of Birth: 19-Jul-1976

## 2016-09-20 ENCOUNTER — Ambulatory Visit: Payer: Medicaid Other | Admitting: Occupational Therapy

## 2016-10-11 ENCOUNTER — Other Ambulatory Visit: Payer: Self-pay | Admitting: Internal Medicine

## 2016-10-11 DIAGNOSIS — R079 Chest pain, unspecified: Secondary | ICD-10-CM

## 2016-10-11 DIAGNOSIS — R600 Localized edema: Secondary | ICD-10-CM

## 2016-10-12 ENCOUNTER — Other Ambulatory Visit: Payer: Self-pay | Admitting: Nurse Practitioner

## 2016-10-12 DIAGNOSIS — R600 Localized edema: Secondary | ICD-10-CM

## 2016-10-17 ENCOUNTER — Ambulatory Visit: Payer: Medicaid Other

## 2016-10-22 ENCOUNTER — Ambulatory Visit: Payer: Medicaid Other

## 2016-10-26 ENCOUNTER — Ambulatory Visit: Payer: Medicaid Other | Attending: Family Medicine | Admitting: Occupational Therapy

## 2017-06-11 ENCOUNTER — Ambulatory Visit: Payer: Medicaid Other | Admitting: Physical Therapy

## 2017-06-20 ENCOUNTER — Ambulatory Visit: Payer: Medicaid Other | Admitting: Physical Therapy

## 2017-06-24 ENCOUNTER — Ambulatory Visit: Payer: Medicaid Other | Admitting: Physical Therapy

## 2017-06-24 ENCOUNTER — Ambulatory Visit: Payer: Medicaid Other | Attending: Nurse Practitioner | Admitting: Physical Therapy

## 2017-06-24 ENCOUNTER — Encounter: Payer: Self-pay | Admitting: Physical Therapy

## 2017-06-24 DIAGNOSIS — M6281 Muscle weakness (generalized): Secondary | ICD-10-CM | POA: Diagnosis not present

## 2017-06-24 NOTE — Therapy (Signed)
[place] [facility] MAIN [facility] SERVICES [address] [place], [place], [address] Phone: [phone]   Fax:  [phone]  Physical Therapy Evaluation  Patient Details  Name: [name] MRN: [id number] Date of Birth: 04/22/1976 Referring Provider: Irene Limbo   Encounter Date: 06/24/2017  PT End of Session - 06/24/17 1615    Number of Visits  1    Date for PT Re-Evaluation  08/05/17    Authorization Type  medicare    PT Start Time  0415    PT Stop Time  0515    PT Time Calculation (min)  60 min    Activity Tolerance  Patient limited by fatigue;Patient limited by pain    Behavior During Therapy  [facility] for tasks assessed/performed       Past Medical History:  Diagnosis Date  . Barrett's esophagus   . Bile-induced gastritis   . Depression   . Diabetes mellitus without complication (HCC)   . Diverticulitis   . Erosive esophagitis   . Gastropathy   . Hidradenitis suppurativa   . Hyperlipidemia   . Hypertension   . Neuropathy   . Obesity   . Redundant colon     Past Surgical History:  Procedure Laterality Date  . ESOPHAGOGASTRODUODENOSCOPY (EGD) WITH PROPOFOL N/A 11/21/2015   Performed by Christena Deem, MD at [facility] ENDOSCOPY    There were no vitals filed for this visit.   Subjective Assessment - 06/24/17 1521    Subjective  Patient has been in a wc beginning march 2018 due to needing to be off her right foot. She now is having low back pain.  She  is getting weak due to not being  able to walk or stand on her feet. she spends the day in a chair or in bed.      Pertinent History  Patient has a friend with her. She has been having multiple co morbidities with her increased weight , right foot ulcer and back pain and is getting weaker due to needing to be in a wc and off her feet due to her right ulcer on her foot.    How long can you stand comfortably?  Patient is able to stand for a minute at a time    How long can you walk  comfortably?  10 feet with a cane    Patient Stated Goals  to get stronger and walk     Currently in Pain?  Yes    Pain Score  7     Pain Location  Back    Pain Orientation  Right    Pain Descriptors / Indicators  Aching;Burning;Stabbing;Tightness;Heaviness    Pain Type  Chronic pain    Pain Onset  Other (comment) 9 months of back pain    Pain Frequency  Constant    Aggravating Factors   walking, standing, sitting    Pain Relieving Factors  laying down    Effect of Pain on Daily Activities  unable to stand and sit    Multiple Pain Sites  -- feet 5/10, hands 7/10         [facility] PT Assessment - 06/24/17 0001      Assessment   Medical Diagnosis  weakness    Referring Provider  Irene Limbo    Onset Date/Surgical Date  10/04/16    Hand Dominance  Right    Next MD Visit  07/02/17      Precautions   Precautions  Fall  Restrictions   Weight Bearing Restrictions  Yes      Balance Screen   Has the patient fallen in the past 6 months  Yes    How many times?  1    Has the patient had a decrease in activity level because of a fear of falling?   Yes    Is the patient reluctant to leave their home because of a fear of falling?   Yes      Home Environment   Living Environment  Private residence    Living Arrangements  Non-relatives/Friends    Available Help at Discharge  Friend(s)    Type of Home  House    Home Access  Stairs to enter    Entrance Stairs-Number of Steps  4    Entrance Stairs-Rails  Right;Left    Home Layout  One level    Home Equipment  Walker - 4 wheels;Cane - single point      Prior Function   Level of Independence  Needs assistance with ADLs;Independent with household mobility with device    Vocation  On disability    Leisure  read, TV       PAIN: 7/10 low back pain  POSTURE: WFL  PROM/AROM: limited by girth and obesity  STRENGTH:  Graded on a 0-5 scale Muscle Group Left Right  Shoulder flex 4/5 4/5  Shoulder Abd 4/5 4/5  Shoulder Ext  4/5 4/5  Shoulder IR/ER NT NT  Elbow 4/5 4/5  Wrist/hand NT NT  Hip Flex -3/5 3+/5  Hip Abd -3/5 3+/5  Hip Add 1/5 1/5  Hip Ext NT NT  Hip IR/ER NT NT  Knee Flex 3+/5 5/5  Knee Ext -4/5 5/5  Ankle DF 3/5 3/5  Ankle PF     SENSATION: numbness and tingling B feet and left hand   FUNCTIONAL MOBILITY: slow and difficult and independent  GAIT: Patient ambulates with a spc for 10 feet independently  OUTCOME MEASURES: TEST Outcome Interpretation  5 times sit<>stand 16.21sec >60 yo, >15 sec indicates increased risk for falls                               Objective measurements completed on examination: See above findings.              PT Education - 06/24/17 1533    Education provided  Yes    Education Details  plan of care    Person(s) Educated  Patient    Methods  Explanation    Comprehension  Verbalized understanding       PT Short Term Goals - 06/24/17 1625      PT SHORT TERM GOAL #1   Title  Patient will be independent in home exercise program to improve strength/mobility for better functional independence with ADLs.    Time  3    Period  Weeks    Status  New    Target Date  07/15/17        PT Long Term Goals - 06/24/17 1617      PT LONG TERM GOAL #1   Title  Pt. I with gym based/ HEP to improve B UE/LE muscle strength to grossly 4+/5 MMT to improve overall mobility.      Baseline  Generalized strength 4/5 MMT    Time  6    Period  Weeks    Status  New    Target Date  08/05/17  PT LONG TERM GOAL #2   Title  Pt. will increase 5 x sit to stand to improve pain-free mobility with ADL and daily tasks.      Time  8    Period  Weeks    Status  New    Target Date  08/05/17             Plan - 06/24/17 1626    Clinical Impression Statement  Patient is 41 year old female with multiple co morbidities. She has decreased strength BUE,  BLE and core, and she is able to stand for 1 minute at a time. She is wc bound due to a right foot  ulcer that is slowly healing.  She has decreased bed mobiltiy and back pain . She has decreased 5 x sit to stand time. She wiill benefit from skiled PT to improve strength and functional moblility.     Clinical Presentation  Evolving    Clinical Presentation due to:  fall in the past 6 months    Clinical Decision Making  Moderate    Rehab Potential  Good    PT Frequency  1x / week    PT Duration  6 weeks    PT Treatment/Interventions  Therapeutic exercise;Therapeutic activities;Aquatic Therapy;Functional mobility training;Gait training    PT Next Visit Plan  HEP and therapeutic exercise    PT Home Exercise Plan  seated LAQ, seated marching, hooklying marching     Consulted and Agree with Plan of Care  Patient       Patient will benefit from skilled therapeutic intervention in order to improve the following deficits and impairments:  Decreased endurance, Decreased mobility, Difficulty walking, Pain, Impaired flexibility, Decreased activity tolerance  Visit Diagnosis: Muscle weakness (generalized) - Plan: PT plan of care cert/re-cert     Problem List There are no active problems to display for this patient.   [address]Ethan Kasperski S, [place] DPT 06/24/2017, 5:19 PM   [facility] [address] [place], [place], [address] Phone: [phone]   Fax:  [phone]  Name: [name] MRN: [id number] Date of Birth: 1976-06-19

## 2017-07-01 ENCOUNTER — Encounter: Payer: 59 | Admitting: Physical Therapy

## 2017-07-09 ENCOUNTER — Ambulatory Visit: Payer: Medicaid Other | Admitting: Physical Therapy

## 2017-07-17 ENCOUNTER — Ambulatory Visit: Payer: Medicaid Other | Attending: Nurse Practitioner | Admitting: Physical Therapy

## 2017-07-25 ENCOUNTER — Ambulatory Visit: Payer: Medicaid Other | Admitting: Physical Therapy

## 2017-10-08 ENCOUNTER — Encounter: Admit: 2017-10-08 | Discharge: 2017-10-09 | Payer: BLUE CROSS/BLUE SHIELD

## 2017-10-08 ENCOUNTER — Ambulatory Visit: Admit: 2017-10-08 | Discharge: 2017-10-09 | Payer: BLUE CROSS/BLUE SHIELD

## 2017-10-08 DIAGNOSIS — L02211 Cutaneous abscess of abdominal wall: Principal | ICD-10-CM

## 2017-10-09 MED ORDER — SULFAMETHOXAZOLE 800 MG-TRIMETHOPRIM 160 MG TABLET
ORAL_TABLET | Freq: Two times a day (BID) | ORAL | 0 refills | 0 days | Status: CP
Start: 2017-10-09 — End: 2017-10-19

## 2017-10-09 MED ORDER — AMOXICILLIN 875 MG-POTASSIUM CLAVULANATE 125 MG TABLET
ORAL_TABLET | Freq: Two times a day (BID) | ORAL | 0 refills | 0.00000 days | Status: CP
Start: 2017-10-09 — End: 2017-10-19

## 2017-10-09 MED ORDER — CETIRIZINE 10 MG TABLET
ORAL_TABLET | Freq: Every day | ORAL | 11 refills | 0 days | Status: CP
Start: 2017-10-09 — End: 2018-10-09

## 2017-10-09 MED ORDER — GABAPENTIN 300 MG CAPSULE
ORAL_CAPSULE | Freq: Two times a day (BID) | ORAL | 0 refills | 0 days | Status: CP
Start: 2017-10-09 — End: 2017-11-08

## 2017-10-23 ENCOUNTER — Encounter: Payer: Medicaid Other | Attending: Nurse Practitioner | Admitting: Nurse Practitioner

## 2017-10-23 DIAGNOSIS — L02211 Cutaneous abscess of abdominal wall: Secondary | ICD-10-CM | POA: Diagnosis not present

## 2017-10-23 DIAGNOSIS — Z794 Long term (current) use of insulin: Secondary | ICD-10-CM | POA: Insufficient documentation

## 2017-10-23 DIAGNOSIS — L98499 Non-pressure chronic ulcer of skin of other sites with unspecified severity: Secondary | ICD-10-CM | POA: Insufficient documentation

## 2017-10-23 DIAGNOSIS — Z9641 Presence of insulin pump (external) (internal): Secondary | ICD-10-CM | POA: Diagnosis not present

## 2017-10-23 DIAGNOSIS — I890 Lymphedema, not elsewhere classified: Secondary | ICD-10-CM | POA: Diagnosis not present

## 2017-10-23 DIAGNOSIS — E1042 Type 1 diabetes mellitus with diabetic polyneuropathy: Secondary | ICD-10-CM | POA: Insufficient documentation

## 2017-10-23 DIAGNOSIS — I10 Essential (primary) hypertension: Secondary | ICD-10-CM | POA: Diagnosis not present

## 2017-10-23 DIAGNOSIS — E10622 Type 1 diabetes mellitus with other skin ulcer: Secondary | ICD-10-CM | POA: Insufficient documentation

## 2017-10-23 DIAGNOSIS — E11622 Type 2 diabetes mellitus with other skin ulcer: Secondary | ICD-10-CM | POA: Diagnosis present

## 2017-10-23 DIAGNOSIS — Z6841 Body Mass Index (BMI) 40.0 and over, adult: Secondary | ICD-10-CM | POA: Insufficient documentation

## 2017-10-24 NOTE — Progress Notes (Addendum)
[name], [name] ([id number]) Visit Report for 10/23/2017 Abuse/Suicide Risk Screen Details Patient Name: Brooke Brooke, Day Date of Service: 10/23/2017 12:30 PM Medical Record Number: [id number] Patient Account Number: [id number] Date of Birth/Sex: 03/16/1976 (42 y.o. Female) Treating RN: Curtis Sites Primary Care Parth Mccormac: Cheryll Dessert Other Clinician: Referring Jahi Roza: Cheryll Dessert Treating Kennley Schwandt/Extender: Kathreen Cosier in Treatment: 0 Abuse/Suicide Risk Screen Items Answer ABUSE/SUICIDE RISK SCREEN: Has anyone close to you tried to hurt or harm you recentlyo No Do you feel uncomfortable with anyone in your familyo No Has anyone forced you do things that you didnot want to doo No Do you have any thoughts of harming yourselfo No Patient displays signs or symptoms of abuse and/or neglect. No Electronic Signature(s) Signed: 10/23/2017 12:22:01 PM By: Curtis Sites Entered By: Curtis Sites on 10/23/2017 12:22:01 [name], [name] ([id number]) -------------------------------------------------------------------------------- Activities of Daily Living Details Patient Name: [name] Date of Service: 10/23/2017 12:30 PM Medical Record Number: [id number] Patient Account Number: [id number] Date of Birth/Sex: 1975-09-17 (42 y.o. Female) Treating RN: Curtis Sites Primary Care Jazelyn Sipe: Cheryll Dessert Other Clinician: Referring Shawniece Oyola: Cheryll Dessert Treating Lin Glazier/Extender: Kathreen Cosier in Treatment: 0 Activities of Daily Living Items Answer Activities of Daily Living (Please select one for each item) Drive Automobile Not Able Take Medications Completely Able Use Telephone Completely Able Care for Appearance Completely Able Use Toilet Completely Able Bath / Shower Need Assistance Dress Self Need Assistance Feed Self Completely Able Walk Need Assistance Get In / Out Bed Need Assistance Housework Completely Able Prepare Meals Completely  Able Handle Money Completely Able Shop for Self Completely Able Electronic Signature(s) Signed: 10/23/2017 5:43:37 PM By: Curtis Sites Previous Signature: 10/23/2017 12:22:22 PM Version By: Curtis Sites Entered By: Curtis Sites on 10/23/2017 12:39:07 [name], [name] ([id number]) -------------------------------------------------------------------------------- Education Assessment Details Patient Name: [name] Date of Service: 10/23/2017 12:30 PM Medical Record Number: [id number] Patient Account Number: [id number] Date of Birth/Sex: [birth year] (42 y.o. Female) Treating RN: Curtis Sites Primary Care Jisselle Poth: Cheryll Dessert Other Clinician: Referring Laurie Penado: Cheryll Dessert Treating Annaliesa Blann/Extender: Kathreen Cosier in Treatment: 0 Primary Learner Assessed: Patient Learning Preferences/Education Level/Primary Language Learning Preference: Explanation, Demonstration Highest Education Level: High School Preferred Language: English Cognitive Barrier Assessment/Beliefs Language Barrier: No Translator Needed: No Memory Deficit: No Emotional Barrier: No Cultural/Religious Beliefs Affecting Medical Care: No Physical Barrier Assessment Impaired Vision: No Impaired Hearing: No Decreased Hand dexterity: No Knowledge/Comprehension Assessment Knowledge Level: Medium Comprehension Level: Medium Ability to understand written Medium instructions: Ability to understand verbal Medium instructions: Motivation Assessment Anxiety Level: Calm Cooperation: Cooperative Education Importance: Acknowledges Need Interest in Health Problems: Asks Questions Perception: Coherent Willingness to Engage in Self- Medium Management Activities: Readiness to Engage in Self- Medium Management Activities: Electronic Signature(s) Signed: 10/23/2017 12:22:55 PM By: Curtis Sites Entered By: Curtis Sites on 10/23/2017 12:22:54 [name], [name]  ([id number]) -------------------------------------------------------------------------------- Fall Risk Assessment Details Patient Name: [name] Date of Service: 10/23/2017 12:30 PM Medical Record Number: [id number] Patient Account Number: [id number] Date of Birth/Sex: September 10, 1975 (42 y.o. Female) Treating RN: Curtis Sites Primary Care Dustin Burrill: Cheryll Dessert Other Clinician: Referring Taven Strite: Cheryll Dessert Treating Gurinder Toral/Extender: Kathreen Cosier in Treatment: 0 Fall Risk Assessment Items Have you had 2 or more falls in the last 12 monthso 0 No Have you had any fall that resulted in injury in the last 12 monthso 0 No FALL RISK ASSESSMENT: History of falling - immediate or within 3 months 0 No Secondary diagnosis 0 No Ambulatory aid None/bed rest/wheelchair/nurse 0 Yes  Crutches/cane/walker 0 No Furniture 0 No IV Access/Saline Lock 0 No Gait/Training Normal/bed rest/immobile 0 Yes Weak 10 Yes Impaired 0 No Mental Status Oriented to own ability 0 Yes Electronic Signature(s) Signed: 10/23/2017 12:23:05 PM By: Curtis Sites Entered By: Curtis Sites on 10/23/2017 12:23:05 [name], [name] ([id number]) -------------------------------------------------------------------------------- Foot Assessment Details Patient Name: [name] Date of Service: 10/23/2017 12:30 PM Medical Record Number: [id number] Patient Account Number: [id number] Date of Birth/Sex: 18-Oct-1975 (42 y.o. Female) Treating RN: Curtis Sites Primary Care Jacqualyn Sedgwick: Cheryll Dessert Other Clinician: Referring Clevland Cork: Cheryll Dessert Treating Tara Wich/Extender: Kathreen Cosier in Treatment: 0 Foot Assessment Items Site Locations + = Sensation present, - = Sensation absent, C = Callus, U = Ulcer R = Redness, W = Warmth, M = Maceration, PU = Pre-ulcerative lesion F = Fissure, S = Swelling, D = Dryness Assessment Right: Left: Other Deformity: No No Prior Foot Ulcer: No No Prior  Amputation: No No Charcot Joint: No No Ambulatory Status: Ambulatory With Help Assistance Device: Walker Gait: Steady Electronic Signature(s) Signed: 10/23/2017 5:43:37 PM By: Curtis Sites Entered By: Curtis Sites on 10/23/2017 12:51:43 [name], [name] ([id number]) -------------------------------------------------------------------------------- Nutrition Risk Assessment Details Patient Name: [name] Date of Service: 10/23/2017 12:30 PM Medical Record Number: [id number] Patient Account Number: [id number] Date of Birth/Sex: 1976/01/31 (42 y.o. Female) Treating RN: Curtis Sites Primary Care Elida Harbin: Cheryll Dessert Other Clinician: Referring Priest Lockridge: Cheryll Dessert Treating Gael Delude/Extender: Kathreen Cosier in Treatment: 0 Height (in): 66 Weight (lbs): 399 Body Mass Index (BMI): 64.4 Nutrition Risk Assessment Items NUTRITION RISK SCREEN: I have an illness or condition that made me change the kind and/or amount of 0 No food I eat I eat fewer than two meals per day 0 No I eat few fruits and vegetables, or milk products 0 No I have three or more drinks of beer, liquor or wine almost every day 0 No I have tooth or mouth problems that make it hard for me to eat 0 No I don't always have enough money to buy the food I need 0 No I eat alone most of the time 0 No I take three or more different prescribed or over-the-counter drugs a day 1 Yes Without wanting to, I have lost or gained 10 pounds in the last six months 0 No I am not always physically able to shop, cook and/or feed myself 0 No Nutrition Protocols Good Risk Protocol 0 No interventions needed Moderate Risk Protocol Electronic Signature(s) Signed: 10/23/2017 12:23:12 PM By: Curtis Sites Entered By: Curtis Sites on 10/23/2017 12:23:12

## 2017-10-24 NOTE — Progress Notes (Addendum)
Brooke, Day ([id number]) Visit Report for 10/23/2017 Allergy List Details Patient Name: [name], Waid Date of Service: 10/23/2017 12:30 PM Medical Record Number: [id number] Patient Account Number: [id number] Date of Birth/Sex: 20-May-1976 (42 y.o. Female) Treating RN: Curtis Sites Primary Care Koben Daman: Cheryll Dessert Other Clinician: Referring Debora Stockdale: Cheryll Dessert Treating Joya Willmott/Extender: Kathreen Cosier in Treatment: 0 Allergies Active Allergies Lamictal Reaction: hives and swelling oxycodone Reaction: get sick hydrocodone Reaction: get sick Allergy Notes Electronic Signature(s) Signed: 10/23/2017 12:21:50 PM By: Curtis Sites Entered By: Curtis Sites on 10/23/2017 12:21:49 [name], [name] ([id number]) -------------------------------------------------------------------------------- Arrival Information Details Patient Name: [name] Date of Service: 10/23/2017 12:30 PM Medical Record Number: [id number] Patient Account Number: [id number] Date of Birth/Sex: 1975-10-26 (42 y.o. Female) Treating RN: Curtis Sites Primary Care Murlean Seelye: Cheryll Dessert Other Clinician: Referring Jakaila Norment: Cheryll Dessert Treating Gaetana Kawahara/Extender: Kathreen Cosier in Treatment: 0 Visit Information Patient Arrived: Wheel Chair Arrival Time: 12:34 Accompanied By: spouse Transfer Assistance: None Patient Identification Verified: Yes Secondary Verification Process Completed: Yes Patient Has Alerts: Yes Patient Alerts: DMII aspirin 81 History Since Last Visit Added or deleted any medications: No Any new allergies or adverse reactions: No Had a fall or experienced change in activities of daily living that may affect risk of falls: No Signs or symptoms of abuse/neglect since last visito No Hospitalized since last visit: No Electronic Signature(s) Signed: 10/23/2017 5:43:37 PM By: Curtis Sites Entered By: Curtis Sites on 10/23/2017 12:35:41 [name],  [name] ([id number]) -------------------------------------------------------------------------------- Clinic Level of Care Assessment Details Patient Name: [name] Date of Service: 10/23/2017 12:30 PM Medical Record Number: [id number] Patient Account Number: [id number] Date of Birth/Sex: 09/15/1975 (42 y.o. Female) Treating RN: Curtis Sites Primary Care Eliz Nigg: Cheryll Dessert Other Clinician: Referring Tyronda Vizcarrondo: Cheryll Dessert Treating Samia Kukla/Extender: Kathreen Cosier in Treatment: 0 Clinic Level of Care Assessment Items TOOL 2 Quantity Score []  - Use when only an EandM is performed on the INITIAL visit 0 ASSESSMENTS - Nursing Assessment / Reassessment X - General Physical Exam (combine w/ comprehensive assessment (listed just below) when 1 20 performed on new pt. evals) X- 1 25 Comprehensive Assessment (HX, ROS, Risk Assessments, Wounds Hx, etc.) ASSESSMENTS - Wound and Skin Assessment / Reassessment []  - Simple Wound Assessment / Reassessment - one wound 0 X- 2 5 Complex Wound Assessment / Reassessment - multiple wounds []  - 0 Dermatologic / Skin Assessment (not related to wound area) ASSESSMENTS - Ostomy and/or Continence Assessment and Care []  - Incontinence Assessment and Management 0 []  - 0 Ostomy Care Assessment and Management (repouching, etc.) PROCESS - Coordination of Care X - Simple Patient / Family Education for ongoing care 1 15 []  - 0 Complex (extensive) Patient / Family Education for ongoing care X- 1 10 Staff obtains Chiropractor, Records, Test Results / Process Orders []  - 0 Staff telephones HHA, Nursing Homes / Clarify orders / etc []  - 0 Routine Transfer to another Facility (non-emergent condition) []  - 0 Routine Hospital Admission (non-emergent condition) X- 1 15 New Admissions / Manufacturing engineer / Ordering NPWT, Apligraf, etc. []  - 0 Emergency Hospital Admission (emergent condition) X- 1 10 Simple Discharge Coordination []  -  0 Complex (extensive) Discharge Coordination PROCESS - Special Needs []  - Pediatric / Minor Patient Management 0 []  - 0 Isolation Patient Management [name], Josselyn ([id number]) []  - 0 Hearing / Language / Visual special needs []  - 0 Assessment of Community assistance (transportation, D/C planning, etc.) []  - 0 Additional assistance / Altered mentation []  - 0  Support Surface(s) Assessment (bed, cushion, seat, etc.) INTERVENTIONS - Wound Cleansing / Measurement X - Wound Imaging (photographs - any number of wounds) 1 5 []  - 0 Wound Tracing (instead of photographs) []  - 0 Simple Wound Measurement - one wound X- 2 5 Complex Wound Measurement - multiple wounds []  - 0 Simple Wound Cleansing - one wound X- 2 5 Complex Wound Cleansing - multiple wounds INTERVENTIONS - Wound Dressings X - Small Wound Dressing one or multiple wounds 2 10 []  - 0 Medium Wound Dressing one or multiple wounds []  - 0 Large Wound Dressing one or multiple wounds []  - 0 Application of Medications - injection INTERVENTIONS - Miscellaneous []  - External ear exam 0 []  - 0 Specimen Collection (cultures, biopsies, blood, body fluids, etc.) []  - 0 Specimen(s) / Culture(s) sent or taken to Lab for analysis []  - 0 Patient Transfer (multiple staff / Nurse, adult / Similar devices) []  - 0 Simple Staple / Suture removal (25 or less) []  - 0 Complex Staple / Suture removal (26 or more) []  - 0 Hypo / Hyperglycemic Management (close monitor of Blood Glucose) []  - 0 Ankle / Brachial Index (ABI) - do not check if billed separately Has the patient been seen at the hospital within the last three years: Yes Total Score: 150 Level Of Care: New/Established - Level 4 Electronic Signature(s) Signed: 10/23/2017 5:43:37 PM By: Curtis Sites Entered By: Curtis Sites on 10/23/2017 13:07:39 [name], [name] ([id number]) -------------------------------------------------------------------------------- Encounter Discharge  Information Details Patient Name: [name] Date of Service: 10/23/2017 12:30 PM Medical Record Number: [id number] Patient Account Number: [id number] Date of Birth/Sex: 23-May-1976 (42 y.o. Female) Treating RN: Huel Coventry Primary Care Ester Hilley: Cheryll Dessert Other Clinician: Referring Travoris Bushey: Cheryll Dessert Treating Trayvond Viets/Extender: Kathreen Cosier in Treatment: 0 Encounter Discharge Information Items Discharge Pain Level: 0 Discharge Condition: Stable Ambulatory Status: Wheelchair Discharge Destination: Home Transportation: Private Auto Accompanied By: spouse Schedule Follow-up Appointment: Yes Medication Reconciliation completed and No provided to Patient/Care Keeley Sussman: Provided on Clinical Summary of Care: 10/23/2017 Form Type Recipient Paper Patient KDR Electronic Signature(s) Signed: 10/23/2017 5:43:37 PM By: Curtis Sites Entered By: Curtis Sites on 10/23/2017 16:36:21 [name], [name] ([id number]) -------------------------------------------------------------------------------- Lower Extremity Assessment Details Patient Name: [name] Date of Service: 10/23/2017 12:30 PM Medical Record Number: [id number] Patient Account Number: [id number] Date of Birth/Sex: 1976-01-18 (42 y.o. Female) Treating RN: Curtis Sites Primary Care Leovanni Bjorkman: Cheryll Dessert Other Clinician: Referring Kiernan Farkas: Cheryll Dessert Treating Srija Southard/Extender: Kathreen Cosier in Treatment: 0 Electronic Signature(s) Signed: 10/23/2017 5:43:37 PM By: Curtis Sites Entered By: Curtis Sites on 10/23/2017 12:59:36 [name], [name] ([id number]) -------------------------------------------------------------------------------- Multi Wound Chart Details Patient Name: [name] Date of Service: 10/23/2017 12:30 PM Medical Record Number: [id number] Patient Account Number: [id number] Date of Birth/Sex: 1976/05/12 (42 y.o. Female) Treating RN: Curtis Sites Primary Care  Davinci Glotfelty: Cheryll Dessert Other Clinician: Referring Joshwa Hemric: Cheryll Dessert Treating Dean Goldner/Extender: Kathreen Cosier in Treatment: 0 Vital Signs Height(in): 66 Pulse(bpm): 102 Weight(lbs): 385 Blood Pressure(mmHg): 136/87 Body Mass Index(BMI): 62 Temperature(F): 97.6 Respiratory Rate 18 (breaths/min): Photos: [2:No Photos] [3:No Photos] [N/A:N/A] Wound Location: [2:Right Abdomen - midline] [3:Left Abdomen - midline] [N/A:N/A] Wounding Event: [2:Bump] [3:Not Known] [N/A:N/A] Primary Etiology: [2:Abscess] [3:Atypical] [N/A:N/A] Comorbid History: [2:Lymphedema, Hypertension, Type II Diabetes, Neuropathy] [3:Lymphedema, Hypertension, Type II Diabetes, Neuropathy] [N/A:N/A] Date Acquired: [2:10/07/2017] [3:08/12/2017] [N/A:N/A] Weeks of Treatment: [2:0] [3:0] [N/A:N/A] Wound Status: [2:Open] [3:Open] [N/A:N/A] Measurements L x W x D [2:0.8x0.9x0.8] [3:0.6x0.8x0.1] [N/A:N/A] (cm) Area (cm) : [2:0.565] [3:0.377] [N/A:N/A]  Volume (cm) : [2:0.452] [3:0.038] [N/A:N/A] Starting Position 1 [2:1] (o'clock): Ending Position 1 [2:1] (o'clock): Maximum Distance 1 (cm): [2:1.3] Undermining: [2:Yes] [3:No] [N/A:N/A] Classification: [2:Full Thickness Without Exposed Support Structures] [3:Full Thickness Without Exposed Support Structures] [N/A:N/A] Exudate Amount: [2:Large] [3:None Present] [N/A:N/A] Exudate Type: [2:Serous] [3:N/A] [N/A:N/A] Exudate Color: [2:amber] [3:N/A] [N/A:N/A] Wound Margin: [2:Flat and Intact] [3:Flat and Intact] [N/A:N/A] Granulation Amount: [2:Large (67-100%)] [3:None Present (0%)] [N/A:N/A] Granulation Quality: [2:Red] [3:N/A] [N/A:N/A] Necrotic Amount: [2:Small (1-33%)] [3:Large (67-100%)] [N/A:N/A] Necrotic Tissue: [2:Adherent Slough] [3:Eschar] [N/A:N/A] Exposed Structures: [2:Fat Layer (Subcutaneous Tissue) Exposed: Yes Fascia: No Tendon: No Muscle: No Joint: No Bone: No] [3:Fat Layer (Subcutaneous Tissue) Exposed: Yes Fascia: No Tendon: No Muscle:  No Joint: No Bone: No] [N/A:N/A] Epithelialization: [2:None] [3:None] [N/A:N/A] Periwound Skin Texture: Induration: Yes Excoriation: No N/A Excoriation: No Induration: No Callus: No Callus: No Crepitus: No Crepitus: No Rash: No Rash: No Scarring: No Scarring: No Periwound Skin Moisture: Maceration: No Maceration: No N/A Dry/Scaly: No Dry/Scaly: No Periwound Skin Color: Ecchymosis: Yes Atrophie Blanche: No N/A Atrophie Blanche: No Cyanosis: No Cyanosis: No Ecchymosis: No Erythema: No Erythema: No Hemosiderin Staining: No Hemosiderin Staining: No Mottled: No Mottled: No Pallor: No Pallor: No Rubor: No Rubor: No Temperature: No Abnormality No Abnormality N/A Tenderness on Palpation: No No N/A Wound Preparation: Ulcer Cleansing: Ulcer Cleansing: N/A Rinsed/Irrigated with Saline Rinsed/Irrigated with Saline Topical Anesthetic Applied: Topical Anesthetic Applied: Other: lidocaine 4% Other: lidocaine 4% Treatment Notes Electronic Signature(s) Signed: 10/23/2017 1:10:25 PM By: Bonnell Publicoulter, Leah Entered By: Bonnell Publicoulter, Leah on 10/23/2017 13:10:25 [name], Brooke AddisonKATIE ([id number]) -------------------------------------------------------------------------------- Multi-Disciplinary Care Plan Details Patient Name: Brooke [name], Janissa Date of Service: 10/23/2017 12:30 PM Medical Record Number: [id number] Patient Account Number: [id number] Date of Birth/Sex: 06/21/1976 (42 y.o. Female) Treating RN: Curtis Sitesorthy, Joanna Primary Care Rehanna Oloughlin: Cheryll DessertGEYER, KATHERINE Other Clinician: Referring Kiara Mcdowell: Cheryll DessertGEYER, KATHERINE Treating Mckenley Birenbaum/Extender: Kathreen Cosieroulter, Leah Weeks in Treatment: 0 Active Inactive ` Abuse / Safety / Falls / Self Care Management Nursing Diagnoses: Impaired physical mobility Goals: Patient will not experience any injury related to falls Date Initiated: 10/23/2017 Target Resolution Date: 01/11/2018 Goal Status: Active Interventions: Assess fall risk on admission and as  needed Notes: ` Nutrition Nursing Diagnoses: Imbalanced nutrition Goals: Patient/caregiver agrees to and verbalizes understanding of need to use nutritional supplements and/or vitamins as prescribed Date Initiated: 10/23/2017 Target Resolution Date: 01/11/2018 Goal Status: Active Interventions: Assess patient nutrition upon admission and as needed per policy Notes: ` Orientation to the Wound Care Program Nursing Diagnoses: Knowledge deficit related to the wound healing center program Goals: Patient/caregiver will verbalize understanding of the Wound Healing Center Program Date Initiated: 10/23/2017 Target Resolution Date: 01/11/2018 Goal Status: Active Interventions: [name], Margurete ([id number]) Provide education on orientation to the wound center Notes: ` Wound/Skin Impairment Nursing Diagnoses: Impaired tissue integrity Goals: Ulcer/skin breakdown will heal within 14 weeks Date Initiated: 10/23/2017 Target Resolution Date: 01/11/2018 Goal Status: Active Interventions: Assess patient/caregiver ability to obtain necessary supplies Assess patient/caregiver ability to perform ulcer/skin care regimen upon admission and as needed Assess ulceration(s) every visit Notes: Electronic Signature(s) Signed: 10/23/2017 5:43:37 PM By: Curtis Sitesorthy, Joanna Entered By: Curtis Sitesorthy, Joanna on 10/23/2017 13:00:49 [name], Brooke AddisonKATIE ([id number]) -------------------------------------------------------------------------------- Pain Assessment Details Patient Name: Brooke [name], Omaira Date of Service: 10/23/2017 12:30 PM Medical Record Number: [id number] Patient Account Number: [id number] Date of Birth/Sex: 06/21/1976 (42 y.o. Female) Treating RN: Curtis Sitesorthy, Joanna Primary Care Barbee Mamula: Cheryll DessertGEYER, KATHERINE Other Clinician: Referring Chinyere Galiano: Cheryll DessertGEYER, KATHERINE Treating Recardo Linn/Extender: Kathreen Cosieroulter, Leah Weeks in Treatment: 0 Active Problems Location of Pain Severity  and Description of Pain Patient Has Paino Yes Site  Locations Pain Location: Pain in Ulcers With Dressing Change: Yes Duration of the Pain. Constant / Intermittento Intermittent Pain Management and Medication Current Pain Management: Electronic Signature(s) Signed: 10/23/2017 5:43:37 PM By: Curtis Sites Entered By: Curtis Sites on 10/23/2017 12:36:41 [name], [name] ([id number]) -------------------------------------------------------------------------------- Patient/Caregiver Education Details Patient Name: [name] Date of Service: 10/23/2017 12:30 PM Medical Record Number: [id number] Patient Account Number: [id number] Date of Birth/Gender: 1976/07/25 (42 y.o. Female) Treating RN: Curtis Sites Primary Care Physician: Cheryll Dessert Other Clinician: Referring Physician: Cheryll Dessert Treating Physician/Extender: Kathreen Cosier in Treatment: 0 Education Assessment Education Provided To: Patient and Caregiver Education Topics Provided Wound/Skin Impairment: Handouts: Other: wound care as ordered Methods: Demonstration, Explain/Verbal Responses: State content correctly Electronic Signature(s) Signed: 10/23/2017 5:43:37 PM By: Curtis Sites Entered By: Curtis Sites on 10/23/2017 16:36:45 [name], [name] ([id number]) -------------------------------------------------------------------------------- Wound Assessment Details Patient Name: [name] Date of Service: 10/23/2017 12:30 PM Medical Record Number: [id number] Patient Account Number: [id number] Date of Birth/Sex: August 18, 1975 (42 y.o. Female) Treating RN: Curtis Sites Primary Care Indira Sorenson: Cheryll Dessert Other Clinician: Referring Steve Gregg: Cheryll Dessert Treating Hildy Nicholl/Extender: Kathreen Cosier in Treatment: 0 Wound Status Wound Number: 2 Primary Abscess Etiology: Wound Location: Right Abdomen - midline Wound Status: Open Wounding Event: Bump Comorbid Lymphedema, Hypertension, Type II Date Acquired: 10/07/2017 History:  Diabetes, Neuropathy Weeks Of Treatment: 0 Clustered Wound: No Photos Photo Uploaded By: Curtis Sites on 10/23/2017 16:18:49 Wound Measurements Length: (cm) 0.8 Width: (cm) 0.9 Depth: (cm) 0.8 Area: (cm) 0.565 Volume: (cm) 0.452 % Reduction in Area: % Reduction in Volume: Epithelialization: None Tunneling: No Undermining: Yes Starting Position (o'clock): 1 Ending Position (o'clock): 1 Maximum Distance: (cm) 1.3 Wound Description Full Thickness Without Exposed Support Classification: Structures Wound Margin: Flat and Intact Exudate Large Amount: Exudate Type: Serous Exudate Color: amber Foul Odor After Cleansing: No Slough/Fibrino Yes Wound Bed Granulation Amount: Large (67-100%) Exposed Structure Granulation Quality: Red Fascia Exposed: No Necrotic Amount: Small (1-33%) Fat Layer (Subcutaneous Tissue) Exposed: Yes [name], Antia ([id number]) Necrotic Quality: Adherent Slough Tendon Exposed: No Muscle Exposed: No Joint Exposed: No Bone Exposed: No Periwound Skin Texture Texture Color No Abnormalities Noted: No No Abnormalities Noted: No Callus: No Atrophie Blanche: No Crepitus: No Cyanosis: No Excoriation: No Ecchymosis: Yes Induration: Yes Erythema: No Rash: No Hemosiderin Staining: No Scarring: No Mottled: No Pallor: No Moisture Rubor: No No Abnormalities Noted: No Dry / Scaly: No Temperature / Pain Maceration: No Temperature: No Abnormality Wound Preparation Ulcer Cleansing: Rinsed/Irrigated with Saline Topical Anesthetic Applied: Other: lidocaine 4%, Treatment Notes Wound #2 (Right Abdomen - midline) 1. Cleansed with: Clean wound with Normal Saline 2. Anesthetic Topical Lidocaine 4% cream to wound bed prior to debridement 4. Dressing Applied: Prisma Ag 5. Secondary Dressing Applied Bordered Foam Dressing Dry Gauze Electronic Signature(s) Signed: 10/23/2017 5:43:37 PM By: Curtis Sites Entered By: Curtis Sites on 10/23/2017  12:49:19 [name], [name] ([id number]) -------------------------------------------------------------------------------- Wound Assessment Details Patient Name: [name] Date of Service: 10/23/2017 12:30 PM Medical Record Number: [id number] Patient Account Number: [id number] Date of Birth/Sex: July 16, 1976 (42 y.o. Female) Treating RN: Curtis Sites Primary Care Khristi Schiller: Cheryll Dessert Other Clinician: Referring Ophia Shamoon: Cheryll Dessert Treating Aarti Mankowski/Extender: Kathreen Cosier in Treatment: 0 Wound Status Wound Number: 3 Primary Atypical Etiology: Wound Location: Left Abdomen - midline Wound Status: Open Wounding Event: Not Known Comorbid Lymphedema, Hypertension, Type II Date Acquired: 08/12/2017 History: Diabetes, Neuropathy Weeks Of Treatment: 0 Clustered Wound: No  Photos Photo Uploaded By: Curtis Sites on 10/23/2017 16:18:50 Wound Measurements Length: (cm) 0.6 Width: (cm) 0.8 Depth: (cm) 0.1 Area: (cm) 0.377 Volume: (cm) 0.038 % Reduction in Area: % Reduction in Volume: Epithelialization: None Tunneling: No Undermining: No Wound Description Full Thickness Without Exposed Support Classification: Structures Wound Margin: Flat and Intact Exudate None Present Amount: Foul Odor After Cleansing: No Slough/Fibrino No Wound Bed Granulation Amount: None Present (0%) Exposed Structure Necrotic Amount: Large (67-100%) Fascia Exposed: No Necrotic Quality: Eschar Fat Layer (Subcutaneous Tissue) Exposed: Yes Tendon Exposed: No Muscle Exposed: No Joint Exposed: No Bone Exposed: No Periwound Skin Texture Texture Color [name], Dashay ([id number]) No Abnormalities Noted: No No Abnormalities Noted: No Callus: No Atrophie Blanche: No Crepitus: No Cyanosis: No Excoriation: No Ecchymosis: No Induration: No Erythema: No Rash: No Hemosiderin Staining: No Scarring: No Mottled: No Pallor: No Moisture Rubor: No No Abnormalities Noted: No Dry /  Scaly: No Temperature / Pain Maceration: No Temperature: No Abnormality Wound Preparation Ulcer Cleansing: Rinsed/Irrigated with Saline Topical Anesthetic Applied: Other: lidocaine 4%, Treatment Notes Wound #3 (Left Abdomen - midline) 1. Cleansed with: Clean wound with Normal Saline 2. Anesthetic Topical Lidocaine 4% cream to wound bed prior to debridement 4. Dressing Applied: Santyl Ointment 5. Secondary Dressing Applied Bordered Foam Dressing Dry Gauze Electronic Signature(s) Signed: 10/23/2017 5:43:37 PM By: Curtis Sites Entered By: Curtis Sites on 10/23/2017 12:54:43 [name], [name] ([id number]) -------------------------------------------------------------------------------- Vitals Details Patient Name: [name] Date of Service: 10/23/2017 12:30 PM Medical Record Number: [id number] Patient Account Number: [id number] Date of Birth/Sex: 1975/09/24 (42 y.o. Female) Treating RN: Curtis Sites Primary Care Shiquita Collignon: Cheryll Dessert Other Clinician: Referring Darean Rote: Cheryll Dessert Treating Dezaria Methot/Extender: Kathreen Cosier in Treatment: 0 Vital Signs Time Taken: 12:40 Temperature (F): 97.6 Height (in): 66 Pulse (bpm): 102 Source: Measured Respiratory Rate (breaths/min): 18 Weight (lbs): 385 Blood Pressure (mmHg): 136/87 Source: Measured Reference Range: 80 - 120 mg / dl Body Mass Index (BMI): 62.1 Electronic Signature(s) Signed: 10/23/2017 5:43:37 PM By: Curtis Sites Entered By: Curtis Sites on 10/23/2017 12:42:03

## 2017-10-25 NOTE — Progress Notes (Signed)
ABILENE, MCPHEE ([id number]) Visit Report for 10/23/2017 Chief Complaint Document Details Patient Name: Brooke ROSEJayden, Rudge Date of Service: 10/23/2017 12:30 PM Medical Record Number: [id number] Patient Account Number: [id number] Date of Birth/Sex: 05-09-1976 (42 y.o. Female) Treating RN: Huel Coventry Primary Care Provider: Cheryll Dessert Other Clinician: Referring Provider: Cheryll Dessert Treating Provider/Extender: Kathreen Cosier in Treatment: 0 Information Obtained from: Patient Chief Complaint She is here for evaluation of multiple abdominal wounds Electronic Signature(s) Signed: 10/23/2017 1:10:47 PM By: Bonnell Public Entered By: Bonnell Public on 10/23/2017 13:10:47 [name], [name] ([id number]) -------------------------------------------------------------------------------- HPI Details Patient Name: [name] Date of Service: 10/23/2017 12:30 PM Medical Record Number: [id number] Patient Account Number: [id number] Date of Birth/Sex: 01-Apr-1976 (42 y.o. Female) Treating RN: Huel Coventry Primary Care Provider: Cheryll Dessert Other Clinician: Referring Provider: Cheryll Dessert Treating Provider/Extender: Kathreen Cosier in Treatment: 0 History of Present Illness HPI Description: 10/23/17-she is here for initial evaluation of 2 abdominal wounds. She is a type I diabetic with an insulin pump, she developed an abscess to the right side of her abdomen at an insertion sites. She underwent incision and drainage on March 5 and was packed with iodoform packing. The packing was maintained until her follow-up appointment on March 13 at which time it was replaced. She has not been instructed to change packing daily and packing has been maintained. She continues to have some induration and pain although significantly improved. Originally after incision and drainage she was placed on Augmentin and Bactrim which she completed Saturday 3/16. she underwent a biopsy of a mole to her left  lateral abdomen and is being followed by dermatology with plans for wide excision of "abnormal cells" in June. She is on chronic suppressive antibiotic therapy (doxycycline) for hidradenitis. Her last A1c was 7.2 in November and states her daily glucose levels are normally in the low 100s. Electronic Signature(s) Signed: 10/23/2017 1:16:04 PM By: Bonnell Public Previous Signature: 10/23/2017 1:14:03 PM Version By: Bonnell Public Entered By: Bonnell Public on 10/23/2017 13:16:03 [name], [name] ([id number]) -------------------------------------------------------------------------------- Physical Exam Details Patient Name: [name] Date of Service: 10/23/2017 12:30 PM Medical Record Number: [id number] Patient Account Number: [id number] Date of Birth/Sex: 10-18-1975 (42 y.o. Female) Treating RN: Huel Coventry Primary Care Provider: Cheryll Dessert Other Clinician: Referring Provider: Cheryll Dessert Treating Provider/Extender: Bonnell Public Weeks in Treatment: 0 Respiratory non-labored. clear to auscultation. Cardiovascular s1 s2 regular, rate controlled. Musculoskeletal arrived in wheelchair. Psychiatric does appear to have good insight or judgement to medical needs. oriented x4. calm, cooperative. Electronic Signature(s) Signed: 10/23/2017 1:14:56 PM By: Bonnell Public Entered By: Bonnell Public on 10/23/2017 13:14:56 [name], [name] ([id number]) -------------------------------------------------------------------------------- Physician Orders Details Patient Name: [name] Date of Service: 10/23/2017 12:30 PM Medical Record Number: [id number] Patient Account Number: [id number] Date of Birth/Sex: 01/06/1976 (42 y.o. Female) Treating RN: Curtis Sites Primary Care Provider: Cheryll Dessert Other Clinician: Referring Provider: Cheryll Dessert Treating Provider/Extender: Kathreen Cosier in Treatment: 0 Verbal / Phone Orders: No Diagnosis Coding Wound Cleansing Wound  #2 Right Abdomen - midline o Clean wound with Normal Saline. o May Shower, gently pat wound dry prior to applying new dressing. Wound #3 Left Abdomen - midline o Clean wound with Normal Saline. o May Shower, gently pat wound dry prior to applying new dressing. Anesthetic (add to Medication List) Wound #2 Right Abdomen - midline o Topical Lidocaine 4% cream applied to wound bed prior to debridement (In Clinic Only). Wound #3 Left Abdomen - midline o Topical Lidocaine 4%  cream applied to wound bed prior to debridement (In Clinic Only). Primary Wound Dressing Wound #2 Right Abdomen - midline o Prisma Ag Wound #3 Left Abdomen - midline o Santyl Ointment Secondary Dressing Wound #2 Right Abdomen - midline o Boardered Foam Dressing o Drawtex Wound #3 Left Abdomen - midline o Boardered Foam Dressing o Drawtex Dressing Change Frequency Wound #2 Right Abdomen - midline o Change dressing every other day. Wound #3 Left Abdomen - midline o Change dressing every other day. Follow-up Appointments Wound #2 Right Abdomen - midline o Return Appointment in 1 week. [name], Peggyann ([id number]) Wound #3 Left Abdomen - midline o Return Appointment in 1 week. Additional Orders / Instructions Wound #2 Right Abdomen - midline o Increase protein intake. o Activity as tolerated Wound #3 Left Abdomen - midline o Increase protein intake. o Activity as tolerated Medications-please add to medication list. Wound #3 Left Abdomen - midline o Santyl Enzymatic Ointment Electronic Signature(s) Signed: 10/23/2017 5:31:19 PM By: Bonnell Public Entered By: Bonnell Public on 10/23/2017 13:15:10 [name], [name] ([id number]) -------------------------------------------------------------------------------- Problem List Details Patient Name: [name] Date of Service: 10/23/2017 12:30 PM Medical Record Number: [id number] Patient Account Number: [id number] Date of  Birth/Sex: Feb 26, 1976 (42 y.o. Female) Treating RN: Huel Coventry Primary Care Provider: Cheryll Dessert Other Clinician: Referring Provider: Cheryll Dessert Treating Provider/Extender: Kathreen Cosier in Treatment: 0 Active Problems ICD-10 Encounter Code Description Active Date Diagnosis L02.211 Cutaneous abscess of abdominal wall 10/23/2017 Yes S31.109S Unspecified open wound of abdominal wall, unspecified quadrant 10/23/2017 Yes without penetration into peritoneal cavity, sequela E11.622 Type 2 diabetes mellitus with other skin ulcer 10/23/2017 Yes E66.01 Morbid (severe) obesity due to excess calories 10/23/2017 Yes Inactive Problems Resolved Problems Electronic Signature(s) Signed: 10/23/2017 1:10:18 PM By: Bonnell Public Entered By: Bonnell Public on 10/23/2017 13:10:17 [name], [name] ([id number]) -------------------------------------------------------------------------------- Progress Note Details Patient Name: [name] Date of Service: 10/23/2017 12:30 PM Medical Record Number: [id number] Patient Account Number: [id number] Date of Birth/Sex: September 01, 1975 (42 y.o. Female) Treating RN: Huel Coventry Primary Care Provider: Cheryll Dessert Other Clinician: Referring Provider: Cheryll Dessert Treating Provider/Extender: Kathreen Cosier in Treatment: 0 Subjective Chief Complaint Information obtained from Patient She is here for evaluation of multiple abdominal wounds History of Present Illness (HPI) 10/23/17-she is here for initial evaluation of 2 abdominal wounds. She is a type I diabetic with an insulin pump, she developed an abscess to the right side of her abdomen at an insertion sites. She underwent incision and drainage on March 5 and was packed with iodoform packing. The packing was maintained until her follow-up appointment on March 13 at which time it was replaced. She has not been instructed to change packing daily and packing has been maintained. She continues to  have some induration and pain although significantly improved. Originally after incision and drainage she was placed on Augmentin and Bactrim which she completed Saturday 3/16. she underwent a biopsy of a mole to her left lateral abdomen and is being followed by dermatology with plans for wide excision of "abnormal cells" in June. She is on chronic suppressive antibiotic therapy (doxycycline) for hidradenitis. Her last A1c was 7.2 in November and states her daily glucose levels are normally in the low 100s. Wound History Patient presents with 1 open wound that has been present for approximately 2 weeks. Patient has been treating wound in the following manner: iodoform packing. Laboratory tests have not been performed in the last month. Patient reportedly has not tested positive for an  antibiotic resistant organism. Patient reportedly has not tested positive for osteomyelitis. Patient reportedly has not had testing performed to evaluate circulation in the legs. Patient History Information obtained from Patient. Allergies Lamictal (Reaction: hives and swelling), oxycodone (Reaction: get sick), hydrocodone (Reaction: get sick) Family History Diabetes - Father,Mother,Siblings, Heart Disease - Mother,Father,Siblings, Hypertension - Mother,Father,Siblings, No family history of Cancer, Hereditary Spherocytosis, Kidney Disease, Lung Disease, Seizures, Stroke, Thyroid Problems, Tuberculosis. Social History Former smoker - quit 2013, Marital Status - Single, Alcohol Use - Never, Drug Use - No History, Caffeine Use - Moderate. Medical And Surgical History Notes Gastrointestinal diverticulitis Immunological hidradenitis suppurativa Musculoskeletal fibromyalgia [name], Martasia ([id number]) Review of Systems (ROS) Constitutional Symptoms (General Health) The patient has no complaints or symptoms. Eyes The patient has no complaints or symptoms. Ear/Nose/Mouth/Throat The patient has no complaints  or symptoms. Respiratory The patient has no complaints or symptoms. Cardiovascular The patient has no complaints or symptoms. Gastrointestinal The patient has no complaints or symptoms. Genitourinary Complains or has symptoms of Kidney failure/ Dialysis - CKD. Immunological The patient has no complaints or symptoms. Integumentary (Skin) The patient has no complaints or symptoms. Musculoskeletal The patient has no complaints or symptoms. General Notes: patient is adopted and not very certain of family medical history Objective Constitutional Vitals Time Taken: 12:40 PM, Height: 66 in, Source: Measured, Weight: 385 lbs, Source: Measured, BMI: 62.1, Temperature: 97.6 F, Pulse: 102 bpm, Respiratory Rate: 18 breaths/min, Blood Pressure: 136/87 mmHg. Respiratory non-labored. clear to auscultation. Cardiovascular s1 s2 regular, rate controlled. Musculoskeletal arrived in wheelchair. Psychiatric does appear to have good insight or judgement to medical needs. oriented x4. calm, cooperative. Integumentary (Hair, Skin) Wound #2 status is Open. Original cause of wound was Bump. The wound is located on the Right Abdomen - midline. The wound measures 0.8cm length x 0.9cm width x 0.8cm depth; 0.565cm^2 area and 0.452cm^3 volume. There is Fat Layer (Subcutaneous Tissue) Exposed exposed. There is no tunneling noted, however, there is undermining starting at 1:00 and ending at 1:00 with a maximum distance of 1.3cm. There is a large amount of serous drainage noted. The wound margin is flat and intact. There is large (67-100%) red granulation within the wound bed. There is a small (1-33%) amount of necrotic tissue within the wound bed including Adherent Slough. The periwound skin appearance exhibited: Induration, Ecchymosis. [name], Jeneal ([id number]) The periwound skin appearance did not exhibit: Callus, Crepitus, Excoriation, Rash, Scarring, Dry/Scaly, Maceration, Atrophie Blanche, Cyanosis,  Hemosiderin Staining, Mottled, Pallor, Rubor, Erythema. Periwound temperature was noted as No Abnormality. Wound #3 status is Open. Original cause of wound was Not Known. The wound is located on the Left Abdomen - midline. The wound measures 0.6cm length x 0.8cm width x 0.1cm depth; 0.377cm^2 area and 0.038cm^3 volume. There is Fat Layer (Subcutaneous Tissue) Exposed exposed. There is no tunneling or undermining noted. There is a none present amount of drainage noted. The wound margin is flat and intact. There is no granulation within the wound bed. There is a large (67-100%) amount of necrotic tissue within the wound bed including Eschar. The periwound skin appearance did not exhibit: Callus, Crepitus, Excoriation, Induration, Rash, Scarring, Dry/Scaly, Maceration, Atrophie Blanche, Cyanosis, Ecchymosis, Hemosiderin Staining, Mottled, Pallor, Rubor, Erythema. Periwound temperature was noted as No Abnormality. Assessment Active Problems ICD-10 L02.211 - Cutaneous abscess of abdominal wall S31.109S - Unspecified open wound of abdominal wall, unspecified quadrant without penetration into peritoneal cavity, sequela E11.622 - Type 2 diabetes mellitus with other skin ulcer E66.01 - Morbid (severe)  obesity due to excess calories Plan Wound Cleansing: Wound #2 Right Abdomen - midline: Clean wound with Normal Saline. May Shower, gently pat wound dry prior to applying new dressing. Wound #3 Left Abdomen - midline: Clean wound with Normal Saline. May Shower, gently pat wound dry prior to applying new dressing. Anesthetic (add to Medication List): Wound #2 Right Abdomen - midline: Topical Lidocaine 4% cream applied to wound bed prior to debridement (In Clinic Only). Wound #3 Left Abdomen - midline: Topical Lidocaine 4% cream applied to wound bed prior to debridement (In Clinic Only). Primary Wound Dressing: Wound #2 Right Abdomen - midline: Prisma Ag Wound #3 Left Abdomen - midline: Santyl  Ointment Secondary Dressing: Wound #2 Right Abdomen - midline: Boardered Foam Dressing Drawtex Wound #3 Left Abdomen - midline: Boardered Foam Dressing Drawtex Dressing Change Frequency: [name], Eugene ([id number]) Wound #2 Right Abdomen - midline: Change dressing every other day. Wound #3 Left Abdomen - midline: Change dressing every other day. Follow-up Appointments: Wound #2 Right Abdomen - midline: Return Appointment in 1 week. Wound #3 Left Abdomen - midline: Return Appointment in 1 week. Additional Orders / Instructions: Wound #2 Right Abdomen - midline: Increase protein intake. Activity as tolerated Wound #3 Left Abdomen - midline: Increase protein intake. Activity as tolerated Medications-please add to medication list.: Wound #3 Left Abdomen - midline: Santyl Enzymatic Ointment 1. Prisma to the right abdomen 3 times weekly 2. Santyl daily to the left lateral abdomen 3. Follow-up next week Electronic Signature(s) Signed: 10/23/2017 1:16:19 PM By: Bonnell Public Previous Signature: 10/23/2017 1:15:41 PM Version By: Bonnell Public Entered By: Bonnell Public on 10/23/2017 13:16:18 [name], [name] ([id number]) -------------------------------------------------------------------------------- ROS/PFSH Details Patient Name: [name] Date of Service: 10/23/2017 12:30 PM Medical Record Number: [id number] Patient Account Number: [id number] Date of Birth/Sex: 06-14-1976 (42 y.o. Female) Treating RN: Curtis Sites Primary Care Provider: Cheryll Dessert Other Clinician: Referring Provider: Cheryll Dessert Treating Provider/Extender: Kathreen Cosier in Treatment: 0 Information Obtained From Patient Wound History Do you currently have one or more open woundso Yes How many open wounds do you currently haveo 1 Approximately how long have you had your woundso 2 weeks How have you been treating your wound(s) until nowo iodoform packing Has your wound(s) ever healed  and then re-openedo No Have you had any lab work done in the past montho No Have you tested positive for an antibiotic resistant organism (MRSA, VRE)o No Have you tested positive for osteomyelitis (bone infection)o No Have you had any tests for circulation on your legso No Genitourinary Complaints and Symptoms: Positive for: Kidney failure/ Dialysis - CKD Constitutional Symptoms (General Health) Complaints and Symptoms: No Complaints or Symptoms Eyes Complaints and Symptoms: No Complaints or Symptoms Ear/Nose/Mouth/Throat Complaints and Symptoms: No Complaints or Symptoms Hematologic/Lymphatic Medical History: Positive for: Lymphedema Respiratory Complaints and Symptoms: No Complaints or Symptoms Cardiovascular Complaints and Symptoms: No Complaints or Symptoms Medical History: [name], Ernesha ([id number]) Positive for: Hypertension Gastrointestinal Complaints and Symptoms: No Complaints or Symptoms Medical History: Past Medical History Notes: diverticulitis Endocrine Medical History: Positive for: Type II Diabetes Time with diabetes: 2005 Treated with: Insulin, Oral agents Blood sugar tested every day: Yes Tested : qd Immunological Complaints and Symptoms: No Complaints or Symptoms Medical History: Past Medical History Notes: hidradenitis suppurativa Integumentary (Skin) Complaints and Symptoms: No Complaints or Symptoms Musculoskeletal Complaints and Symptoms: No Complaints or Symptoms Medical History: Past Medical History Notes: fibromyalgia Neurologic Medical History: Positive for: Neuropathy Oncologic Medical History: Negative for: Received Chemotherapy; Received  Radiation Immunizations Pneumococcal Vaccine: Received Pneumococcal Vaccination: No Immunization Notes: up to date Brooke NIEVE, ROJERO ([id number]) Implantable Devices Family and Social History Cancer: No; Diabetes: Yes - Father,Mother,Siblings; Heart Disease: Yes -  Mother,Father,Siblings; Hereditary Spherocytosis: No; Hypertension: Yes - Mother,Father,Siblings; Kidney Disease: No; Lung Disease: No; Seizures: No; Stroke: No; Thyroid Problems: No; Tuberculosis: No; Former smoker - quit 2013; Marital Status - Single; Alcohol Use: Never; Drug Use: No History; Caffeine Use: Moderate; Financial Concerns: No; Food, Clothing or Shelter Needs: No; Support System Lacking: No; Transportation Concerns: No; Advanced Directives: No; Patient does not want information on Advanced Directives Notes patient is adopted and not very certain of family medical history Electronic Signature(s) Signed: 10/23/2017 5:31:19 PM By: Bonnell Public Signed: 10/23/2017 5:43:37 PM By: Curtis Sites Entered By: Curtis Sites on 10/23/2017 12:38:20 [name], [name] ([id number]) -------------------------------------------------------------------------------- SuperBill Details Patient Name: [name] Date of Service: 10/23/2017 Medical Record Number: [id number] Patient Account Number: [id number] Date of Birth/Sex: November 05, 1975 (42 y.o. Female) Treating RN: Huel Coventry Primary Care Provider: Cheryll Dessert Other Clinician: Referring Provider: Cheryll Dessert Treating Provider/Extender: Kathreen Cosier in Treatment: 0 Diagnosis Coding ICD-10 Codes Code Description L02.211 Cutaneous abscess of abdominal wall Unspecified open wound of abdominal wall, unspecified quadrant without penetration into peritoneal S31.109S cavity, sequela E11.622 Type 2 diabetes mellitus with other skin ulcer E66.01 Morbid (severe) obesity due to excess calories Facility Procedures CPT4 Code: [id number] Description: 99214 - WOUND CARE VISIT-LEV 4 EST PT Modifier: Quantity: 1 Physician Procedures CPT4: Description Modifier Quantity Code [id number] 99213 - WC PHYS LEVEL 3 - EST PT 1 ICD-10 Diagnosis Description L02.211 Cutaneous abscess of abdominal wall S31.109S Unspecified open wound of abdominal wall,  unspecified quadrant without penetration into  peritoneal cavity, sequela E11.622 Type 2 diabetes mellitus with other skin ulcer E66.01 Morbid (severe) obesity due to excess calories Electronic Signature(s) Signed: 10/23/2017 1:17:04 PM By: Bonnell Public Entered By: Bonnell Public on 10/23/2017 13:17:04

## 2017-10-30 ENCOUNTER — Encounter: Payer: Medicaid Other | Admitting: Internal Medicine

## 2017-10-30 DIAGNOSIS — E10622 Type 1 diabetes mellitus with other skin ulcer: Secondary | ICD-10-CM | POA: Diagnosis not present

## 2017-11-01 NOTE — Progress Notes (Signed)
KENZI, BARDWELL ([id number]) Visit Report for 10/30/2017 Arrival Information Details Patient Name: Brooke ROSENandi, [place] Date of Service: 10/30/2017 3:00 PM Medical Record Number: [id number] Patient Account Number: [id number] Date of Birth/Sex: Dec 07, 1975 (42 y.o. F) Treating RN: Curtis Sites Primary Care Tiann Saha: Cheryll Dessert Other Clinician: Referring Yago Ludvigsen: Cheryll Dessert Treating Matteus Mcnelly/Extender: Altamese [place] in Treatment: 1 Visit Information History Since Last Visit All ordered tests and consults were completed: No Patient Arrived: Wheel Chair Added or deleted any medications: No Arrival Time: 15:15 Any new allergies or adverse reactions: No Accompanied By: boyfriend Had a fall or experienced change in No Transfer Assistance: EasyPivot Patient activities of daily living that may affect Lift risk of falls: Patient Identification Verified: Yes Signs or symptoms of abuse/neglect since last visito No Secondary Verification Process Yes Hospitalized since last visit: No Completed: Implantable device outside of the clinic excluding No Patient Requires Transmission-Based No cellular tissue based products placed in the center Precautions: since last visit: Patient Has Alerts: Yes Has Dressing in Place as Prescribed: Yes Patient Alerts: DMII Pain Present Now: Yes aspirin 81 Electronic Signature(s) Signed: 10/30/2017 4:02:56 PM By: Curtis Sites Entered By: Curtis Sites on 10/30/2017 15:16:38 Brooke ROSE, [name] ([id number]) -------------------------------------------------------------------------------- Encounter Discharge Information Details Patient Name: [name] Date of Service: 10/30/2017 3:00 PM Medical Record Number: [id number] Patient Account Number: [id number] Date of Birth/Sex: [birth year] (42 y.o. F) Treating RN: Huel Coventry Primary Care Shawnette Augello: Cheryll Dessert Other Clinician: Referring Xitlalli Newhard: Cheryll Dessert Treating  Kaedan Richert/Extender: Altamese [place] in Treatment: 1 Encounter Discharge Information Items Discharge Pain Level: 0 Discharge Condition: Stable Ambulatory Status: Wheelchair Discharge Destination: Home Transportation: Private Auto Accompanied By: spouse Schedule Follow-up Appointment: Yes Medication Reconciliation completed and No provided to Patient/Care Mija Effertz: Provided on Clinical Summary of Care: 10/30/2017 Form Type Recipient Paper Patient KDR Electronic Signature(s) Signed: 10/30/2017 3:59:17 PM By: Curtis Sites Entered By: Curtis Sites on 10/30/2017 15:59:17 Brooke ROSE, [name] ([id number]) -------------------------------------------------------------------------------- Lower Extremity Assessment Details Patient Name: [name] Date of Service: 10/30/2017 3:00 PM Medical Record Number: [id number] Patient Account Number: [id number] Date of Birth/Sex: 1976-04-02 (42 y.o. F) Treating RN: Curtis Sites Primary Care Fallon Haecker: Cheryll Dessert Other Clinician: Referring Verenise Moulin: Cheryll Dessert Treating Aarya Robinson/Extender: Altamese [place] in Treatment: 1 Electronic Signature(s) Signed: 10/30/2017 4:02:56 PM By: Curtis Sites Entered By: Curtis Sites on 10/30/2017 15:26:26 Brooke ROSE, [name] ([id number]) -------------------------------------------------------------------------------- Multi Wound Chart Details Patient Name: [name] Date of Service: 10/30/2017 3:00 PM Medical Record Number: [id number] Patient Account Number: [id number] Date of Birth/Sex: April 10, 1976 (42 y.o. F) Treating RN: Huel Coventry Primary Care Tarry Blayney: Cheryll Dessert Other Clinician: Referring Cady Hafen: Cheryll Dessert Treating Lyfe Reihl/Extender: Altamese [place] in Treatment: 1 Vital Signs Height(in): 66 Pulse(bpm): 93 Weight(lbs): 385 Blood Pressure(mmHg): 132/72 Body Mass Index(BMI): 62 Temperature(F): 98.0 Respiratory Rate 18 (breaths/min): Photos:  [N/A:N/A] Wound Location: Right Abdomen - midline Left Abdomen - midline N/A Wounding Event: Bump Not Known N/A Primary Etiology: Abscess Atypical N/A Comorbid History: Lymphedema, Hypertension, Lymphedema, Hypertension, N/A Type II Diabetes, Neuropathy Type II Diabetes, Neuropathy Date Acquired: 10/07/2017 08/12/2017 N/A Weeks of Treatment: 1 1 N/A Wound Status: Open Open N/A Measurements L x W x D 0.9x0.9x0.3 0.4x0.9x0.1 N/A (cm) Area (cm) : 0.636 0.283 N/A Volume (cm) : 0.191 0.028 N/A % Reduction in Area: -12.60% 24.90% N/A % Reduction in Volume: 57.70% 26.30% N/A Starting Position 1 12 (o'clock): Ending Position 1 12 (o'clock): Maximum Distance 1 (cm): 0.8 Undermining: Yes No N/A Classification: Full Thickness Without Full  Thickness Without N/A Exposed Support Structures Exposed Support Structures Exudate Amount: Large Medium N/A Exudate Type: Serous Serous N/A Exudate Color: amber amber N/A Wound Margin: Flat and Intact Flat and Intact N/A Granulation Amount: Large (67-100%) None Present (0%) N/A Granulation Quality: Red N/A N/A Necrotic Amount: Small (1-33%) Large (67-100%) N/A Brooke ROSE, Tiasha ([id number]) Necrotic Tissue: Adherent Slough Eschar, Adherent Slough N/A Exposed Structures: Fat Layer (Subcutaneous Fat Layer (Subcutaneous N/A Tissue) Exposed: Yes Tissue) Exposed: Yes Fascia: No Fascia: No Tendon: No Tendon: No Muscle: No Muscle: No Joint: No Joint: No Bone: No Bone: No Epithelialization: None None N/A Debridement: N/A Debridement - Excisional N/A Pre-procedure N/A 15:30 N/A Verification/Time Out Taken: Pain Control: N/A Other N/A Tissue Debrided: N/A Subcutaneous N/A Level: N/A Skin/Subcutaneous Tissue N/A Debridement Area (sq cm): N/A 0.36 N/A Instrument: N/A Curette N/A Bleeding: N/A Moderate N/A Hemostasis Achieved: N/A Silver Nitrate N/A Procedural Pain: N/A 0 N/A Post Procedural Pain: N/A 0 N/A Debridement Treatment N/A Procedure was  tolerated well N/A Response: Post Debridement N/A 0.4x0.9x0.1 N/A Measurements L x W x D (cm) Post Debridement Volume: N/A 0.028 N/A (cm) Periwound Skin Texture: Induration: Yes Excoriation: No N/A Excoriation: No Induration: No Callus: No Callus: No Crepitus: No Crepitus: No Rash: No Rash: No Scarring: No Scarring: No Periwound Skin Moisture: Maceration: No Maceration: No N/A Dry/Scaly: No Dry/Scaly: No Periwound Skin Color: Ecchymosis: Yes Atrophie Blanche: No N/A Atrophie Blanche: No Cyanosis: No Cyanosis: No Ecchymosis: No Erythema: No Erythema: No Hemosiderin Staining: No Hemosiderin Staining: No Mottled: No Mottled: No Pallor: No Pallor: No Rubor: No Rubor: No Temperature: No Abnormality No Abnormality N/A Tenderness on Palpation: No No N/A Wound Preparation: Ulcer Cleansing: Ulcer Cleansing: N/A Rinsed/Irrigated with Saline Rinsed/Irrigated with Saline Topical Anesthetic Applied: Topical Anesthetic Applied: Other: lidocaine 4% Other: lidocaine 4% Procedures Performed: N/A Debridement N/A Treatment Notes Wound #2 (Right Abdomen - midline) 1. Cleansed with: Clean wound with Normal Saline Brooke ROSE, Haroldine ([id number]) 2. Anesthetic Topical Lidocaine 4% cream to wound bed prior to debridement 4. Dressing Applied: Prisma Ag 5. Secondary Dressing Applied Bordered Foam Dressing Dry Gauze Wound #3 (Left Abdomen - midline) 1. Cleansed with: Clean wound with Normal Saline 2. Anesthetic Topical Lidocaine 4% cream to wound bed prior to debridement 4. Dressing Applied: Prisma Ag 5. Secondary Dressing Applied Bordered Foam Dressing Dry Gauze Electronic Signature(s) Signed: 10/31/2017 8:15:41 AM By: Baltazar Najjar MD Previous Signature: 10/30/2017 5:06:18 PM Version By: Elliot Gurney, BSN, RN, CWS, Kim RN, BSN Entered By: Baltazar Najjar on 10/31/2017 07:59:09 Brooke ROSE, [name]  ([id number]) -------------------------------------------------------------------------------- Multi-Disciplinary Care Plan Details Patient Name: [name] Date of Service: 10/30/2017 3:00 PM Medical Record Number: [id number] Patient Account Number: [id number] Date of Birth/Sex: January 23, 1976 (42 y.o. F) Treating RN: Huel Coventry Primary Care Jessia Kief: Cheryll Dessert Other Clinician: Referring Daysia Vandenboom: Cheryll Dessert Treating Tyjanae Bartek/Extender: Altamese [place] in Treatment: 1 Active Inactive ` Abuse / Safety / Falls / Self Care Management Nursing Diagnoses: Impaired physical mobility Goals: Patient will not experience any injury related to falls Date Initiated: 10/23/2017 Target Resolution Date: 01/11/2018 Goal Status: Active Interventions: Assess fall risk on admission and as needed Notes: ` Nutrition Nursing Diagnoses: Imbalanced nutrition Goals: Patient/caregiver agrees to and verbalizes understanding of need to use nutritional supplements and/or vitamins as prescribed Date Initiated: 10/23/2017 Target Resolution Date: 01/11/2018 Goal Status: Active Interventions: Assess patient nutrition upon admission and as needed per policy Notes: ` Orientation to the Wound Care Program Nursing Diagnoses: Knowledge deficit related to the wound healing  center program Goals: Patient/caregiver will verbalize understanding of the Wound Healing Center Program Date Initiated: 10/23/2017 Target Resolution Date: 01/11/2018 Goal Status: Active Interventions: Brooke ROSE, Margart ([id number]) Provide education on orientation to the wound center Notes: ` Wound/Skin Impairment Nursing Diagnoses: Impaired tissue integrity Goals: Ulcer/skin breakdown will heal within 14 weeks Date Initiated: 10/23/2017 Target Resolution Date: 01/11/2018 Goal Status: Active Interventions: Assess patient/caregiver ability to obtain necessary supplies Assess patient/caregiver ability to perform ulcer/skin  care regimen upon admission and as needed Assess ulceration(s) every visit Notes: Electronic Signature(s) Signed: 10/30/2017 5:06:18 PM By: Elliot Gurney, BSN, RN, CWS, Kim RN, BSN Entered By: Elliot Gurney, BSN, RN, CWS, Kim on 10/30/2017 15:30:27 Brooke ROSE, [name] ([id number]) -------------------------------------------------------------------------------- Pain Assessment Details Patient Name: [name] Date of Service: 10/30/2017 3:00 PM Medical Record Number: [id number] Patient Account Number: [id number] Date of Birth/Sex: [birth year] (42 y.o. F) Treating RN: Curtis Sites Primary Care Azizi Bally: Cheryll Dessert Other Clinician: Referring Thanya Cegielski: Cheryll Dessert Treating Bernarda Erck/Extender: Altamese [place] in Treatment: 1 Active Problems Location of Pain Severity and Description of Pain Patient Has Paino Yes Site Locations Pain Location: Pain in Ulcers Rate the pain. Current Pain Level: 7 Character of Pain Describe the Pain: Aching Pain Management and Medication Current Pain Management: Goals for Pain Management Topical or injectable lidocaine is offered to patient for acute pain when surgical debridement is performed. If needed, Patient is instructed to use over the counter pain medication for the following 24-48 hours after debridement. Wound care MDs do not prescribed pain medications. Patient has chronic pain or uncontrolled pain. Patient has been instructed to make an appointment with their Primary Care Physician for pain management. Electronic Signature(s) Signed: 10/30/2017 4:02:56 PM By: Curtis Sites Entered By: Curtis Sites on 10/30/2017 15:17:12 Brooke ROSE, [name] ([id number]) -------------------------------------------------------------------------------- Patient/Caregiver Education Details Patient Name: [name] Date of Service: 10/30/2017 3:00 PM Medical Record Number: [id number] Patient Account Number: [id number] Date of Birth/Gender: Oct 27, 1975 (42 y.o.  F) Treating RN: Curtis Sites Primary Care Physician: Cheryll Dessert Other Clinician: Referring Physician: Cheryll Dessert Treating Physician/Extender: Altamese [place] in Treatment: 1 Education Assessment Education Provided To: Patient and Caregiver Education Topics Provided Wound/Skin Impairment: Handouts: Other: wound care as ordered Methods: Demonstration, Explain/Verbal Responses: State content correctly Electronic Signature(s) Signed: 10/30/2017 4:02:56 PM By: Curtis Sites Entered By: Curtis Sites on 10/30/2017 15:59:45 Brooke ROSE, [name] ([id number]) -------------------------------------------------------------------------------- Wound Assessment Details Patient Name: [name] Date of Service: 10/30/2017 3:00 PM Medical Record Number: [id number] Patient Account Number: [id number] Date of Birth/Sex: 06/05/1976 (42 y.o. F) Treating RN: Curtis Sites Primary Care Delwyn Scoggin: Cheryll Dessert Other Clinician: Referring Cataleya Cristina: Cheryll Dessert Treating Leanora Murin/Extender: Altamese [place] in Treatment: 1 Wound Status Wound Number: 2 Primary Abscess Etiology: Wound Location: Right Abdomen - midline Wound Status: Open Wounding Event: Bump Comorbid Lymphedema, Hypertension, Type II Date Acquired: 10/07/2017 History: Diabetes, Neuropathy Weeks Of Treatment: 1 Clustered Wound: No Photos Photo Uploaded By: Alejandro Mulling on 10/30/2017 15:42:28 Wound Measurements Length: (cm) 0.9 Width: (cm) 0.9 Depth: (cm) 0.3 Area: (cm) 0.636 Volume: (cm) 0.191 % Reduction in Area: -12.6% % Reduction in Volume: 57.7% Epithelialization: None Tunneling: No Undermining: Yes Starting Position (o'clock): 12 Ending Position (o'clock): 12 Maximum Distance: (cm) 0.8 Wound Description Full Thickness Without Exposed Support Classification: Structures Wound Margin: Flat and Intact Exudate Large Amount: Exudate Type: Serous Exudate Color: amber Foul Odor  After Cleansing: No Slough/Fibrino Yes Wound Bed Granulation Amount: Large (67-100%) Exposed Structure Granulation Quality: Red Fascia Exposed: No Necrotic Amount: Small (1-33%)  Fat Layer (Subcutaneous Tissue) Exposed: Yes Brooke ROSE, Satara ([id number]) Necrotic Quality: Adherent Slough Tendon Exposed: No Muscle Exposed: No Joint Exposed: No Bone Exposed: No Periwound Skin Texture Texture Color No Abnormalities Noted: No No Abnormalities Noted: No Callus: No Atrophie Blanche: No Crepitus: No Cyanosis: No Excoriation: No Ecchymosis: Yes Induration: Yes Erythema: No Rash: No Hemosiderin Staining: No Scarring: No Mottled: No Pallor: No Moisture Rubor: No No Abnormalities Noted: No Dry / Scaly: No Temperature / Pain Maceration: No Temperature: No Abnormality Wound Preparation Ulcer Cleansing: Rinsed/Irrigated with Saline Topical Anesthetic Applied: Other: lidocaine 4%, Treatment Notes Wound #2 (Right Abdomen - midline) 1. Cleansed with: Clean wound with Normal Saline 2. Anesthetic Topical Lidocaine 4% cream to wound bed prior to debridement 4. Dressing Applied: Prisma Ag 5. Secondary Dressing Applied Bordered Foam Dressing Dry Gauze Electronic Signature(s) Signed: 10/30/2017 4:02:56 PM By: Curtis Sitesorthy, Joanna Entered By: Curtis Sitesorthy, Joanna on 10/30/2017 15:24:25 Brooke ROSE, Brooke AddisonKATIE ([id number]) -------------------------------------------------------------------------------- Wound Assessment Details Patient Name: Brooke CoonUKE ROSE, Naya Date of Service: 10/30/2017 3:00 PM Medical Record Number: [id number] Patient Account Number: [id number] Date of Birth/Sex: 05-02-1976 (42 y.o. F) Treating RN: Curtis Sitesorthy, Joanna Primary Care Raiana Pharris: Cheryll DessertGEYER, KATHERINE Other Clinician: Referring Hazelynn Mckenny: Cheryll DessertGEYER, KATHERINE Treating Maliek Schellhorn/Extender: Altamese CarolinaOBSON, MICHAEL G Weeks in Treatment: 1 Wound Status Wound Number: 3 Primary Atypical Etiology: Wound Location: Left Abdomen - midline Wound Status:  Open Wounding Event: Not Known Comorbid Lymphedema, Hypertension, Type II Date Acquired: 08/12/2017 History: Diabetes, Neuropathy Weeks Of Treatment: 1 Clustered Wound: No Photos Photo Uploaded By: Alejandro MullingPinkerton, Debra on 10/30/2017 15:42:30 Wound Measurements Length: (cm) 0.4 Width: (cm) 0.9 Depth: (cm) 0.1 Area: (cm) 0.283 Volume: (cm) 0.028 % Reduction in Area: 24.9% % Reduction in Volume: 26.3% Epithelialization: None Tunneling: No Undermining: No Wound Description Full Thickness Without Exposed Support Classification: Structures Wound Margin: Flat and Intact Exudate Medium Amount: Exudate Type: Serous Exudate Color: amber Foul Odor After Cleansing: No Slough/Fibrino Yes Wound Bed Granulation Amount: None Present (0%) Exposed Structure Necrotic Amount: Large (67-100%) Fascia Exposed: No Necrotic Quality: Eschar, Adherent Slough Fat Layer (Subcutaneous Tissue) Exposed: Yes Tendon Exposed: No Muscle Exposed: No Joint Exposed: No Bone Exposed: No Brooke ROSE, Junette ([id number]) Periwound Skin Texture Texture Color No Abnormalities Noted: No No Abnormalities Noted: No Callus: No Atrophie Blanche: No Crepitus: No Cyanosis: No Excoriation: No Ecchymosis: No Induration: No Erythema: No Rash: No Hemosiderin Staining: No Scarring: No Mottled: No Pallor: No Moisture Rubor: No No Abnormalities Noted: No Dry / Scaly: No Temperature / Pain Maceration: No Temperature: No Abnormality Wound Preparation Ulcer Cleansing: Rinsed/Irrigated with Saline Topical Anesthetic Applied: Other: lidocaine 4%, Treatment Notes Wound #3 (Left Abdomen - midline) 1. Cleansed with: Clean wound with Normal Saline 2. Anesthetic Topical Lidocaine 4% cream to wound bed prior to debridement 4. Dressing Applied: Prisma Ag 5. Secondary Dressing Applied Bordered Foam Dressing Dry Gauze Electronic Signature(s) Signed: 10/30/2017 4:02:56 PM By: Curtis Sitesorthy, Joanna Entered By: Curtis Sitesorthy,  Joanna on 10/30/2017 15:21:57 Brooke ROSE, Brooke AddisonKATIE ([id number]) -------------------------------------------------------------------------------- Vitals Details Patient Name: Brooke CoonUKE ROSE, Sally Date of Service: 10/30/2017 3:00 PM Medical Record Number: [id number] Patient Account Number: [id number] Date of Birth/Sex: 05-02-1976 (42 y.o. F) Treating RN: Curtis Sitesorthy, Joanna Primary Care Emmali Karow: Cheryll DessertGEYER, KATHERINE Other Clinician: Referring Francee Setzer: Cheryll DessertGEYER, KATHERINE Treating Yakov Bergen/Extender: Altamese CarolinaOBSON, MICHAEL G Weeks in Treatment: 1 Vital Signs Time Taken: 15:17 Temperature (F): 98.0 Height (in): 66 Pulse (bpm): 93 Weight (lbs): 385 Respiratory Rate (breaths/min): 18 Body Mass Index (BMI): 62.1 Blood Pressure (mmHg): 132/72 Reference Range: 80 - 120 mg / dl Electronic Signature(s) Signed:  10/30/2017 4:02:56 PM By: Curtis Sites Entered By: Curtis Sites on 10/30/2017 15:19:04

## 2017-11-01 NOTE — Progress Notes (Signed)
MYNA, FREIMARK ([id number]) Visit Report for 10/30/2017 Debridement Details Patient Name: Brooke ROSESomalia, Segler Date of Service: 10/30/2017 3:00 PM Medical Record Number: [id number] Patient Account Number: [id number] Date of Birth/Sex: 1975-10-19 (42 y.o. F) Treating RN: Huel Coventry Primary Care Provider: Cheryll Dessert Other Clinician: Referring Provider: Cheryll Dessert Treating Provider/Extender: Altamese [place] in Treatment: 1 Debridement Performed for Wound #3 Left Abdomen - midline Assessment: Performed By: Physician Maxwell Caul, MD Debridement Type: Debridement Pre-procedure Verification/Time Yes - 15:30 Out Taken: Start Time: 15:31 Pain Control: Other : lidocaine 4% Total Area Debrided (L x W): 0.4 (cm) x 0.9 (cm) = 0.36 (cm) Tissue and other material Viable, Non-Viable, Subcutaneous, Fibrin/Exudate debrided: Level: Skin/Subcutaneous Tissue Debridement Description: Excisional Instrument: Curette Bleeding: Moderate Hemostasis Achieved: Silver Nitrate End Time: 15:31 Procedural Pain: 0 Post Procedural Pain: 0 Response to Treatment: Procedure was tolerated well Post Debridement Measurements of Total Wound Length: (cm) 0.4 Width: (cm) 0.9 Depth: (cm) 0.1 Volume: (cm) 0.028 Character of Wound/Ulcer Post Debridement: Stable Post Procedure Diagnosis Same as Pre-procedure Electronic Signature(s) Signed: 10/30/2017 5:06:18 PM By: Elliot Gurney, BSN, RN, CWS, Kim RN, BSN Signed: 10/31/2017 8:15:41 AM By: Baltazar Najjar MD Entered By: Elliot Gurney, BSN, RN, CWS, Kim on 10/30/2017 15:40:19 [name], [name] ([id number]) -------------------------------------------------------------------------------- HPI Details Patient Name: [name] Date of Service: 10/30/2017 3:00 PM Medical Record Number: [id number] Patient Account Number: [id number] Date of Birth/Sex: Oct 23, 1975 (42 y.o. F) Treating RN: Huel Coventry Primary Care Provider: Cheryll Dessert Other  Clinician: Referring Provider: Cheryll Dessert Treating Provider/Extender: Altamese [place] in Treatment: 1 History of Present Illness HPI Description: 10/23/17-she is here for initial evaluation of 2 abdominal wounds. She is a type I diabetic with an insulin pump, she developed an abscess to the right side of her abdomen at an insertion sites. She underwent incision and drainage on March 5 and was packed with iodoform packing. The packing was maintained until her follow-up appointment on March 13 at which time it was replaced. She has not been instructed to change packing daily and packing has been maintained. She continues to have some induration and pain although significantly improved. Originally after incision and drainage she was placed on Augmentin and Bactrim which she completed Saturday 3/16. she underwent a biopsy of a mole to her left lateral abdomen and is being followed by dermatology with plans for wide excision of "abnormal cells" in June. She is on chronic suppressive antibiotic therapy (doxycycline) for hidradenitis. Her last A1c was 7.2 in November and states her daily glucose levels are normally in the low 100s. 10/30/17; patient admitted to our clinic last week. She is a type I diabetic. She had an abscess develop on the right side of her lower abdomen at the site of an insulin pump. This underwent an IandD on March 5. She also underwent an unrelated biopsy of a mole on her lateral abdomen by dermatology. This had "abnormal cells" however I have not actually seen this report. She is due for a wider excision soon Electronic Signature(s) Signed: 10/31/2017 8:15:41 AM By: Baltazar Najjar MD Entered By: Baltazar Najjar on 10/31/2017 08:00:56 [name], [name] ([id number]) -------------------------------------------------------------------------------- Physical Exam Details Patient Name: [name] Date of Service: 10/30/2017 3:00 PM Medical Record Number:  [id number] Patient Account Number: [id number] Date of Birth/Sex: 1976/03/25 (42 y.o. F) Treating RN: Huel Coventry Primary Care Provider: Cheryll Dessert Other Clinician: Referring Provider: Cheryll Dessert Treating Provider/Extender: Altamese [place] in Treatment: 1 Notes wound exam; the patient has 2  separate wounds. The area on the right abdomen towards the midline has some depth and undermining but a reasonably clean base. oThe biopsy site on the left lateral abdomen has a very adherent necrotic surface to this. Using a #3 curet this was debrided. He cleans up quite nicely Electronic Signature(s) Signed: 10/31/2017 8:15:41 AM By: Baltazar Najjar MD Entered By: Baltazar Najjar on 10/31/2017 08:02:45 [name], [name] ([id number]) -------------------------------------------------------------------------------- Physician Orders Details Patient Name: [name] Date of Service: 10/30/2017 3:00 PM Medical Record Number: [id number] Patient Account Number: [id number] Date of Birth/Sex: [birth year] (42 y.o. F) Treating RN: Huel Coventry Primary Care Provider: Cheryll Dessert Other Clinician: Referring Provider: Cheryll Dessert Treating Provider/Extender: Altamese [place] in Treatment: 1 Verbal / Phone Orders: No Diagnosis Coding Wound Cleansing Wound #2 Right Abdomen - midline o Clean wound with Normal Saline. o May Shower, gently pat wound dry prior to applying new dressing. Wound #3 Left Abdomen - midline o Clean wound with Normal Saline. o May Shower, gently pat wound dry prior to applying new dressing. Anesthetic (add to Medication List) Wound #2 Right Abdomen - midline o Topical Lidocaine 4% cream applied to wound bed prior to debridement (In Clinic Only). Wound #3 Left Abdomen - midline o Topical Lidocaine 4% cream applied to wound bed prior to debridement (In Clinic Only). Primary Wound Dressing Wound #2 Right Abdomen - midline o Silver  Collagen Wound #3 Left Abdomen - midline o Silver Collagen Secondary Dressing Wound #2 Right Abdomen - midline o Boardered Foam Dressing o Drawtex Wound #3 Left Abdomen - midline o Boardered Foam Dressing o Drawtex Dressing Change Frequency Wound #2 Right Abdomen - midline o Change dressing every other day. Wound #3 Left Abdomen - midline o Change dressing every other day. Follow-up Appointments Wound #2 Right Abdomen - midline o Return Appointment in 1 week. [name], [name] ([id number]) Wound #3 Left Abdomen - midline o Return Appointment in 1 week. Additional Orders / Instructions Wound #2 Right Abdomen - midline o Increase protein intake. o Activity as tolerated Wound #3 Left Abdomen - midline o Increase protein intake. o Activity as tolerated Electronic Signature(s) Signed: 10/30/2017 5:06:18 PM By: Elliot Gurney, BSN, RN, CWS, Kim RN, BSN Signed: 10/31/2017 8:15:41 AM By: Baltazar Najjar MD Entered By: Elliot Gurney, BSN, RN, CWS, Kim on 10/30/2017 15:37:27 [name], [name] ([id number]) -------------------------------------------------------------------------------- Problem List Details Patient Name: [name] Date of Service: 10/30/2017 3:00 PM Medical Record Number: [id number] Patient Account Number: [id number] Date of Birth/Sex: March 21, 1976 (42 y.o. F) Treating RN: Huel Coventry Primary Care Provider: Cheryll Dessert Other Clinician: Referring Provider: Cheryll Dessert Treating Provider/Extender: Altamese [place] in Treatment: 1 Active Problems ICD-10 Impacting Encounter Code Description Active Date Wound Healing Diagnosis L02.211 Cutaneous abscess of abdominal wall 10/23/2017 Yes S31.109S Unspecified open wound of abdominal wall, unspecified 10/23/2017 Yes quadrant without penetration into peritoneal cavity, sequela E11.622 Type 2 diabetes mellitus with other skin ulcer 10/23/2017 Yes E66.01 Morbid (severe) obesity due to excess calories  10/23/2017 Yes Inactive Problems Resolved Problems Electronic Signature(s) Signed: 10/31/2017 8:15:41 AM By: Baltazar Najjar MD Entered By: Baltazar Najjar on 10/31/2017 07:58:41 [name], [name] ([id number]) -------------------------------------------------------------------------------- Progress Note Details Patient Name: [name] Date of Service: 10/30/2017 3:00 PM Medical Record Number: [id number] Patient Account Number: [id number] Date of Birth/Sex: 11-13-1975 (42 y.o. F) Treating RN: Huel Coventry Primary Care Provider: Cheryll Dessert Other Clinician: Referring Provider: Cheryll Dessert Treating Provider/Extender: Altamese [place] in Treatment: 1 Subjective History of Present Illness (HPI) 10/23/17-she  is here for initial evaluation of 2 abdominal wounds. She is a type I diabetic with an insulin pump, she developed an abscess to the right side of her abdomen at an insertion sites. She underwent incision and drainage on March 5 and was packed with iodoform packing. The packing was maintained until her follow-up appointment on March 13 at which time it was replaced. She has not been instructed to change packing daily and packing has been maintained. She continues to have some induration and pain although significantly improved. Originally after incision and drainage she was placed on Augmentin and Bactrim which she completed Saturday 3/16. she underwent a biopsy of a mole to her left lateral abdomen and is being followed by dermatology with plans for wide excision of "abnormal cells" in June. She is on chronic suppressive antibiotic therapy (doxycycline) for hidradenitis. Her last A1c was 7.2 in November and states her daily glucose levels are normally in the low 100s. 10/30/17; patient admitted to our clinic last week. She is a type I diabetic. She had an abscess develop on the right side of her lower abdomen at the site of an insulin pump. This underwent an IandD on March  5. She also underwent an unrelated biopsy of a mole on her lateral abdomen by dermatology. This had "abnormal cells" however I have not actually seen this report. She is due for a wider excision soon Objective Constitutional Vitals Time Taken: 3:17 PM, Height: 66 in, Weight: 385 lbs, BMI: 62.1, Temperature: 98.0 F, Pulse: 93 bpm, Respiratory Rate: 18 breaths/min, Blood Pressure: 132/72 mmHg. Integumentary (Hair, Skin) Wound #2 status is Open. Original cause of wound was Bump. The wound is located on the Right Abdomen - midline. The wound measures 0.9cm length x 0.9cm width x 0.3cm depth; 0.636cm^2 area and 0.191cm^3 volume. There is Fat Layer (Subcutaneous Tissue) Exposed exposed. There is no tunneling noted, however, there is undermining starting at 12:00 and ending at 12:00 with a maximum distance of 0.8cm. There is a large amount of serous drainage noted. The wound margin is flat and intact. There is large (67-100%) red granulation within the wound bed. There is a small (1-33%) amount of necrotic tissue within the wound bed including Adherent Slough. The periwound skin appearance exhibited: Induration, Ecchymosis. The periwound skin appearance did not exhibit: Callus, Crepitus, Excoriation, Rash, Scarring, Dry/Scaly, Maceration, Atrophie Blanche, Cyanosis, Hemosiderin Staining, Mottled, Pallor, Rubor, Erythema. Periwound temperature was noted as No Abnormality. Wound #3 status is Open. Original cause of wound was Not Known. The wound is located on the Left Abdomen - midline. The wound measures 0.4cm length x 0.9cm width x 0.1cm depth; 0.283cm^2 area and 0.028cm^3 volume. There is Fat Layer (Subcutaneous Tissue) Exposed exposed. There is no tunneling or undermining noted. There is a medium amount of serous drainage noted. The wound margin is flat and intact. There is no granulation within the wound bed. There is a large (67-100%) amount of necrotic tissue within the wound bed including  Eschar and Adherent Slough. The periwound skin appearance did [name], [name] ([id number]) not exhibit: Callus, Crepitus, Excoriation, Induration, Rash, Scarring, Dry/Scaly, Maceration, Atrophie Blanche, Cyanosis, Ecchymosis, Hemosiderin Staining, Mottled, Pallor, Rubor, Erythema. Periwound temperature was noted as No Abnormality. Assessment Active Problems ICD-10 L02.211 - Cutaneous abscess of abdominal wall S31.109S - Unspecified open wound of abdominal wall, unspecified quadrant without penetration into peritoneal cavity, sequela E11.622 - Type 2 diabetes mellitus with other skin ulcer E66.01 - Morbid (severe) obesity due to excess calories Procedures Wound #3  Pre-procedure diagnosis of Wound #3 is an Atypical located on the Left Abdomen - midline . There was a Excisional Skin/Subcutaneous Tissue Debridement with a total area of 0.36 sq cm performed by Maxwell Caul, MD. With the following instrument(s): Curette. to remove Viable and Non-Viable tissue/material Material removed includes Subcutaneous Tissue and Fibrin/Exudate and after achieving pain control using Other (lidocaine 4%). No specimens were taken. A time out was conducted at 15:30, prior to the start of the procedure. A Moderate amount of bleeding was controlled with Silver Nitrate. The procedure was tolerated well with a pain level of 0 throughout and a pain level of 0 following the procedure. Post Debridement Measurements: 0.4cm length x 0.9cm width x 0.1cm depth; 0.028cm^3 volume. Character of Wound/Ulcer Post Debridement is stable. Post procedure Diagnosis Wound #3: Same as Pre-Procedure Plan I don't Wound Cleansing: Wound #2 Right Abdomen - midline: Clean wound with Normal Saline. May Shower, gently pat wound dry prior to applying new dressing. Wound #3 Left Abdomen - midline: Clean wound with Normal Saline. May Shower, gently pat wound dry prior to applying new dressing. Anesthetic (add to Medication  List): Wound #2 Right Abdomen - midline: Topical Lidocaine 4% cream applied to wound bed prior to debridement (In Clinic Only). Wound #3 Left Abdomen - midline: Topical Lidocaine 4% cream applied to wound bed prior to debridement (In Clinic Only). Primary Wound Dressing: Wound #2 Right Abdomen - midline: Silver Collagen Wound #3 Left Abdomen - midline: [name], [name] ([id number]) Silver Collagen Secondary Dressing: Wound #2 Right Abdomen - midline: Boardered Foam Dressing Drawtex Wound #3 Left Abdomen - midline: Boardered Foam Dressing Drawtex Dressing Change Frequency: Wound #2 Right Abdomen - midline: Change dressing every other day. Wound #3 Left Abdomen - midline: Change dressing every other day. Follow-up Appointments: Wound #2 Right Abdomen - midline: Return Appointment in 1 week. Wound #3 Left Abdomen - midline: Return Appointment in 1 week. Additional Orders / Instructions: Wound #2 Right Abdomen - midline: Increase protein intake. Activity as tolerated Wound #3 Left Abdomen - midline: Increase protein intake. Activity as tolerated #1 clearly continue collagen to the right lower abdominal wound which has healthy granulation and undermining #2 post debridement the small area on the left abdomen and had a healthy-looking bases well and I think we can change this over collagen in this area as well. #3 the area on the left abdomen is about to go wider excision although I have not seen the actual pathology and I don't know what the plan of dermatology is Electronic Signature(s) Signed: 10/31/2017 8:15:41 AM By: Baltazar Najjar MD Entered By: Baltazar Najjar on 10/31/2017 08:04:29 [name], [name] ([id number]) -------------------------------------------------------------------------------- SuperBill Details Patient Name: [name] Date of Service: 10/30/2017 Medical Record Number: [id number] Patient Account Number: [id number] Date of Birth/Sex: January 22, 1976 (42  y.o. F) Treating RN: Huel Coventry Primary Care Provider: Cheryll Dessert Other Clinician: Referring Provider: Cheryll Dessert Treating Provider/Extender: Altamese [place] in Treatment: 1 Diagnosis Coding ICD-10 Codes Code Description L02.211 Cutaneous abscess of abdominal wall Unspecified open wound of abdominal wall, unspecified quadrant without penetration into peritoneal S31.109S cavity, sequela E11.622 Type 2 diabetes mellitus with other skin ulcer E66.01 Morbid (severe) obesity due to excess calories Facility Procedures CPT4: Description Modifier Quantity Code [id number] 11042 - DEB SUBQ TISSUE 20 SQ CM/< 1 ICD-10 Diagnosis Description S31.109S Unspecified open wound of abdominal wall, unspecified quadrant without penetration into peritoneal cavity, sequela Physician Procedures CPT4: Description Modifier Quantity Code [id number] 11042 - WC PHYS SUBQ  TISS 20 SQ CM 1 ICD-10 Diagnosis Description S31.109S Unspecified open wound of abdominal wall, unspecified quadrant without penetration into peritoneal cavity, sequela Electronic Signature(s) Signed: 10/31/2017 8:15:41 AM By: Baltazar Najjarobson, Abb Gobert MD Entered By: Baltazar Najjarobson, Omario Ander on 10/31/2017 08:04:56

## 2017-11-06 ENCOUNTER — Encounter: Payer: Medicaid Other | Attending: Internal Medicine | Admitting: Internal Medicine

## 2017-11-06 DIAGNOSIS — Z794 Long term (current) use of insulin: Secondary | ICD-10-CM | POA: Insufficient documentation

## 2017-11-06 DIAGNOSIS — E11622 Type 2 diabetes mellitus with other skin ulcer: Secondary | ICD-10-CM | POA: Diagnosis not present

## 2017-11-06 DIAGNOSIS — E1140 Type 2 diabetes mellitus with diabetic neuropathy, unspecified: Secondary | ICD-10-CM | POA: Insufficient documentation

## 2017-11-06 DIAGNOSIS — I10 Essential (primary) hypertension: Secondary | ICD-10-CM | POA: Insufficient documentation

## 2017-11-06 DIAGNOSIS — Z9641 Presence of insulin pump (external) (internal): Secondary | ICD-10-CM | POA: Insufficient documentation

## 2017-11-06 DIAGNOSIS — Z792 Long term (current) use of antibiotics: Secondary | ICD-10-CM | POA: Diagnosis not present

## 2017-11-06 DIAGNOSIS — L02211 Cutaneous abscess of abdominal wall: Secondary | ICD-10-CM | POA: Insufficient documentation

## 2017-11-06 DIAGNOSIS — I890 Lymphedema, not elsewhere classified: Secondary | ICD-10-CM | POA: Diagnosis not present

## 2017-11-06 DIAGNOSIS — Z6841 Body Mass Index (BMI) 40.0 and over, adult: Secondary | ICD-10-CM | POA: Diagnosis not present

## 2017-11-11 NOTE — Progress Notes (Signed)
KENSLEI, HEARTY ([id number]) Visit Report for 11/06/2017 Arrival Information Details Patient Name: Brooke ROSEAtara, Paterson Date of Service: 11/06/2017 9:15 AM Medical Record Number: [id number] Patient Account Number: [id number] Date of Birth/Sex: [birth year] (42 y.o. F) Treating RN: Renne Crigler Primary Care Adalei Novell: Cheryll Dessert Other Clinician: Referring Julion Gatt: Cheryll Dessert Treating Unika Nazareno/Extender: Altamese [place] in Treatment: 2 Visit Information History Since Last Visit All ordered tests and consults were completed: No Patient Arrived: Wheel Chair Added or deleted any medications: No Arrival Time: 09:21 Any new allergies or adverse reactions: No Accompanied By: boyfriend Had a fall or experienced change in No activities of daily living that may affect Transfer Assistance: None risk of falls: Patient Identification Verified: Yes Signs or symptoms of abuse/neglect since last visito No Secondary Verification Process Completed: Yes Hospitalized since last visit: No Patient Requires Transmission-Based No Implantable device outside of the clinic excluding No Precautions: cellular tissue based products placed in the center Patient Has Alerts: Yes since last visit: Patient Alerts: DMII Pain Present Now: Yes aspirin 81 Electronic Signature(s) Signed: 11/06/2017 11:26:12 AM By: Renne Crigler Entered By: Renne Crigler on 11/06/2017 09:23:03 [name], [name] ([id number]) -------------------------------------------------------------------------------- Encounter Discharge Information Details Patient Name: [name] Date of Service: 11/06/2017 9:15 AM Medical Record Number: [id number] Patient Account Number: [id number] Date of Birth/Sex: 01/03/1976 (42 y.o. F) Treating RN: Huel Coventry Primary Care Barbarita Hutmacher: Cheryll Dessert Other Clinician: Referring Syeda Prickett: Cheryll Dessert Treating Odalis Jordan/Extender: Altamese [place] in Treatment: 2 Encounter  Discharge Information Items Discharge Pain Level: 0 Discharge Condition: Stable Ambulatory Status: Wheelchair Discharge Destination: Home Transportation: Private Auto Accompanied By: boyfriend Schedule Follow-up Appointment: Yes Medication Reconciliation completed and No provided to Patient/Care Wilkie Zenon: Provided on Clinical Summary of Care: 11/06/2017 Form Type Recipient Paper Patient KDR Electronic Signature(s) Signed: 11/06/2017 10:56:03 AM By: Curtis Sites Entered By: Curtis Sites on 11/06/2017 10:56:03 [name], [name] ([id number]) -------------------------------------------------------------------------------- Lower Extremity Assessment Details Patient Name: [name] Date of Service: 11/06/2017 9:15 AM Medical Record Number: [id number] Patient Account Number: [id number] Date of Birth/Sex: 09-06-1975 (42 y.o. F) Treating RN: Renne Crigler Primary Care Avya Flavell: Cheryll Dessert Other Clinician: Referring Phinehas Grounds: Cheryll Dessert Treating Malini Flemings/Extender: Maxwell Caul Weeks in Treatment: 2 Electronic Signature(s) Signed: 11/06/2017 11:26:12 AM By: Renne Crigler Entered By: Renne Crigler on 11/06/2017 09:32:40 [name], [name] ([id number]) -------------------------------------------------------------------------------- Multi Wound Chart Details Patient Name: [name] Date of Service: 11/06/2017 9:15 AM Medical Record Number: [id number] Patient Account Number: [id number] Date of Birth/Sex: 1975-08-11 (42 y.o. F) Treating RN: Huel Coventry Primary Care Saniyah Mondesir: Cheryll Dessert Other Clinician: Referring Krystle Polcyn: Cheryll Dessert Treating Londen Lorge/Extender: Altamese [place] in Treatment: 2 Vital Signs Height(in): 66 Pulse(bpm): 107 Weight(lbs): 385 Blood Pressure(mmHg): 118/71 Body Mass Index(BMI): 62 Temperature(F): 98.4 Respiratory Rate 18 (breaths/min): Photos: [N/A:N/A] Wound Location: Right Abdomen - midline Left Abdomen -  midline N/A Wounding Event: Bump Not Known N/A Primary Etiology: Abscess Atypical N/A Comorbid History: Lymphedema, Hypertension, Lymphedema, Hypertension, N/A Type II Diabetes, Neuropathy Type II Diabetes, Neuropathy Date Acquired: 10/07/2017 08/12/2017 N/A Weeks of Treatment: 2 2 N/A Wound Status: Open Open N/A Measurements L x W x [name] 0.9x0.9x0.3 0.5x0.9x0.1 N/A (cm) Area (cm) : 0.636 0.353 N/A Volume (cm) : 0.191 0.035 N/A % Reduction in Area: -12.60% 6.40% N/A % Reduction in Volume: 57.70% 7.90% N/A Starting Position 1 7 (o'clock): Ending Position 1 4 (o'clock): Maximum Distance 1 (cm): 0.5 Undermining: Yes No N/A Classification: Full Thickness Without Full Thickness Without N/A Exposed Support Structures Exposed Support Structures  Exudate Amount: Large Medium N/A Exudate Type: Serous Serosanguineous N/A Exudate Color: amber red, brown N/A Wound Margin: Flat and Intact Flat and Intact N/A Granulation Amount: Large (67-100%) None Present (0%) N/A Granulation Quality: Red N/A N/A Necrotic Amount: Small (1-33%) Large (67-100%) N/A Necrotic Tissue: Adherent Slough Eschar, Adherent Slough N/A Exposed Structures: N/A [name], [name] ([id number]) Fat Layer (Subcutaneous Fat Layer (Subcutaneous Tissue) Exposed: Yes Tissue) Exposed: Yes Fascia: No Fascia: No Tendon: No Tendon: No Muscle: No Muscle: No Joint: No Joint: No Bone: No Bone: No Epithelialization: None None N/A Debridement: N/A Debridement - Excisional N/A Pre-procedure N/A 09:45 N/A Verification/Time Out Taken: Pain Control: N/A Other N/A Tissue Debrided: N/A Necrotic/Eschar, N/A Subcutaneous Level: N/A Skin/Subcutaneous Tissue N/A Debridement Area (sq cm): N/A 0.45 N/A Instrument: N/A Curette N/A Bleeding: N/A Minimum N/A Hemostasis Achieved: N/A Pressure N/A Procedural Pain: N/A 0 N/A Post Procedural Pain: N/A 0 N/A Debridement Treatment N/A Procedure was tolerated well N/A Response: Post  Debridement N/A 0.5x0.9x0.2 N/A Measurements L x W x [name] (cm) Post Debridement Volume: N/A 0.071 N/A (cm) Periwound Skin Texture: Induration: Yes Excoriation: No N/A Excoriation: No Induration: No Callus: No Callus: No Crepitus: No Crepitus: No Rash: No Rash: No Scarring: No Scarring: No Periwound Skin Moisture: Maceration: No Maceration: No N/A Dry/Scaly: No Dry/Scaly: No Periwound Skin Color: Ecchymosis: Yes Atrophie Blanche: No N/A Atrophie Blanche: No Cyanosis: No Cyanosis: No Ecchymosis: No Erythema: No Erythema: No Hemosiderin Staining: No Hemosiderin Staining: No Mottled: No Mottled: No Pallor: No Pallor: No Rubor: No Rubor: No Temperature: No Abnormality No Abnormality N/A Tenderness on Palpation: Yes Yes N/A Wound Preparation: Ulcer Cleansing: Ulcer Cleansing: N/A Rinsed/Irrigated with Saline Rinsed/Irrigated with Saline Topical Anesthetic Applied: Topical Anesthetic Applied: Other: lidocaine 4% Other: lidocaine 4% Procedures Performed: N/A Debridement N/A Treatment Notes Electronic Signature(s) Signed: 11/06/2017 4:49:21 PM By: Baltazar Najjar MD [name], Jannis ([id number]) Entered By: Baltazar Najjar on 11/06/2017 10:10:17 [name], [name] ([id number]) -------------------------------------------------------------------------------- Multi-Disciplinary Care Plan Details Patient Name: [name] Date of Service: 11/06/2017 9:15 AM Medical Record Number: [id number] Patient Account Number: [id number] Date of Birth/Sex: [birth year] (42 y.o. F) Treating RN: Huel Coventry Primary Care Vayden Weinand: Cheryll Dessert Other Clinician: Referring Johnesha Acheampong: Cheryll Dessert Treating Zayah Keilman/Extender: Altamese [place] in Treatment: 2 Active Inactive ` Abuse / Safety / Falls / Self Care Management Nursing Diagnoses: Impaired physical mobility Goals: Patient will not experience any injury related to falls Date Initiated: 10/23/2017 Target Resolution  Date: 01/11/2018 Goal Status: Active Interventions: Assess fall risk on admission and as needed Notes: ` Nutrition Nursing Diagnoses: Imbalanced nutrition Goals: Patient/caregiver agrees to and verbalizes understanding of need to use nutritional supplements and/or vitamins as prescribed Date Initiated: 10/23/2017 Target Resolution Date: 01/11/2018 Goal Status: Active Interventions: Assess patient nutrition upon admission and as needed per policy Notes: ` Orientation to the Wound Care Program Nursing Diagnoses: Knowledge deficit related to the wound healing center program Goals: Patient/caregiver will verbalize understanding of the Wound Healing Center Program Date Initiated: 10/23/2017 Target Resolution Date: 01/11/2018 Goal Status: Active Interventions: [name], [name] ([id number]) Provide education on orientation to the wound center Notes: ` Wound/Skin Impairment Nursing Diagnoses: Impaired tissue integrity Goals: Ulcer/skin breakdown will heal within 14 weeks Date Initiated: 10/23/2017 Target Resolution Date: 01/11/2018 Goal Status: Active Interventions: Assess patient/caregiver ability to obtain necessary supplies Assess patient/caregiver ability to perform ulcer/skin care regimen upon admission and as needed Assess ulceration(s) every visit Notes: Electronic Signature(s) Signed: 11/11/2017 9:08:37 AM By: Elliot Gurney, BSN, RN, CWS, Kim RN,  BSN Entered By: Woody, BSN, RN, CWS, Kim on 11/06/2017 09:49:08 [name], [name] ([id number]) -------------------------------------------------------------------------------- Pain Assessment Details Patient Name: Brooke [name], [name] Date of Service: 11/06/2017 9:15 AM Medical Record Number: [id number] Patient Account Number: [id number] Date of Birth/Sex: March 16, 1976 (42 y.o. F) Treating RN: Renne CriglerFlinElliot Gurneychum, Cheryl Primary Care Sukhman Martine: Cheryll DessertGEYER, KATHERINE Other Clinician: Referring Kahner Yanik: Cheryll DessertGEYER, KATHERINE Treating Bandy Honaker/Extender: Altamese CarolinaOBSON, MICHAEL  G Weeks in Treatment: 2 Active Problems Location of Pain Severity and Description of Pain Patient Has Paino Yes Site Locations Pain Location: Pain in Ulcers Duration of the Pain. Constant / Intermittento Constant Rate the pain. Current Pain Level: 7 Pain Management and Medication Current Pain Management: Electronic Signature(s) Signed: 11/06/2017 11:26:12 AM By: Renne CriglerFlinchum, Cheryl Entered By: Renne CriglerFlinchum, Cheryl on 11/06/2017 09:23:43 [name], [name] ([id number]) -------------------------------------------------------------------------------- Patient/Caregiver Education Details Patient Name: Brooke [name], Averleigh Date of Service: 11/06/2017 9:15 AM Medical Record Number: [id number] Patient Account Number: [id number] Date of Birth/Gender: March 16, 1976 (42 y.o. F) Treating RN: Curtis Sitesorthy, Joanna Primary Care Physician: Cheryll DessertGEYER, KATHERINE Other Clinician: Referring Physician: Cheryll DessertGEYER, KATHERINE Treating Physician/Extender: Altamese CarolinaOBSON, MICHAEL G Weeks in Treatment: 2 Education Assessment Education Provided To: Patient and Caregiver Education Topics Provided Wound/Skin Impairment: Handouts: Other: wound care as ordered Methods: Demonstration, Explain/Verbal Responses: State content correctly Electronic Signature(s) Signed: 11/06/2017 4:21:37 PM By: Curtis Sitesorthy, Joanna Entered By: Curtis Sitesorthy, Joanna on 11/06/2017 10:56:38 [name], [name] ([id number]) -------------------------------------------------------------------------------- Wound Assessment Details Patient Name: Brooke [name], [name] Date of Service: 11/06/2017 9:15 AM Medical Record Number: [id number] Patient Account Number: [id number] Date of Birth/Sex: March 16, 1976 (42 y.o. F) Treating RN: Renne CriglerFlinchum, Cheryl Primary Care Gaylin Bulthuis: Cheryll DessertGEYER, KATHERINE Other Clinician: Referring Martita Brumm: Cheryll DessertGEYER, KATHERINE Treating Rennie Hack/Extender: Altamese CarolinaOBSON, MICHAEL G Weeks in Treatment: 2 Wound Status Wound Number: 2 Primary Abscess Etiology: Wound Location: Right Abdomen -  midline Wound Status: Open Wounding Event: Bump Comorbid Lymphedema, Hypertension, Type II Date Acquired: 10/07/2017 History: Diabetes, Neuropathy Weeks Of Treatment: 2 Clustered Wound: No Photos Photo Uploaded By: Renne CriglerFlinchum, Cheryl on 11/06/2017 09:36:00 Wound Measurements Length: (cm) 0.9 Width: (cm) 0.9 Depth: (cm) 0.3 Area: (cm) 0.636 Volume: (cm) 0.191 % Reduction in Area: -12.6% % Reduction in Volume: 57.7% Epithelialization: None Tunneling: No Undermining: Yes Starting Position (o'clock): 7 Ending Position (o'clock): 4 Maximum Distance: (cm) 0.5 Wound Description Full Thickness Without Exposed Support Classification: Structures Wound Margin: Flat and Intact Exudate Large Amount: Exudate Type: Serous Exudate Color: amber Foul Odor After Cleansing: No Slough/Fibrino Yes Wound Bed Granulation Amount: Large (67-100%) Exposed Structure Granulation Quality: Red Fascia Exposed: No Necrotic Amount: Small (1-33%) Fat Layer (Subcutaneous Tissue) Exposed: Yes Necrotic Quality: Adherent Slough Tendon Exposed: No Muscle Exposed: No Joint Exposed: No [name], [name] ([id number]) Bone Exposed: No Periwound Skin Texture Texture Color No Abnormalities Noted: No No Abnormalities Noted: No Callus: No Atrophie Blanche: No Crepitus: No Cyanosis: No Excoriation: No Ecchymosis: Yes Induration: Yes Erythema: No Rash: No Hemosiderin Staining: No Scarring: No Mottled: No Pallor: No Moisture Rubor: No No Abnormalities Noted: No Dry / Scaly: No Temperature / Pain Maceration: No Temperature: No Abnormality Tenderness on Palpation: Yes Wound Preparation Ulcer Cleansing: Rinsed/Irrigated with Saline Topical Anesthetic Applied: Other: lidocaine 4%, Treatment Notes Wound #2 (Right Abdomen - midline) 1. Cleansed with: Clean wound with Normal Saline 2. Anesthetic Topical Lidocaine 4% cream to wound bed prior to debridement 3. Peri-wound Care: Antifungal  cream 4. Dressing Applied: Other dressing (specify in notes) 5. Secondary Dressing Applied Bordered Foam Dressing Notes endoform, drawtex Electronic Signature(s) Signed: 11/06/2017 11:26:12 AM By: Renne CriglerFlinchum, Cheryl Entered By: Renne CriglerFlinchum, Cheryl on 11/06/2017 09:29:59 [name], [name] ([id number]) -------------------------------------------------------------------------------- Wound Assessment Details Patient Name: [name], Dedeaux Date of Service: 11/06/2017 9:15 AM Medical Record Number: [id number] Patient Account Number: [id number] Date of Birth/Sex: 14-Jan-1976 (42 y.o. F) Treating RN: Renne Crigler Primary Care Urbano Milhouse: Cheryll Dessert Other Clinician: Referring Laquonda Welby: Cheryll Dessert Treating Wendelin Reader/Extender: Altamese [place] in Treatment: 2 Wound Status Wound Number: 3 Primary Atypical Etiology: Wound Location: Left Abdomen - midline Wound Status: Open Wounding Event: Not Known Comorbid Lymphedema, Hypertension, Type II Date Acquired: 08/12/2017 History: Diabetes, Neuropathy Weeks Of Treatment: 2 Clustered Wound: No Photos Photo Uploaded By: Renne Crigler on 11/06/2017 09:36:00 Wound Measurements Length: (cm) 0.5 Width: (cm) 0.9 Depth: (cm) 0.1 Area: (cm) 0.353 Volume: (cm) 0.035 % Reduction in Area: 6.4% % Reduction in Volume: 7.9% Epithelialization: None Tunneling: No Undermining: No Wound Description Full Thickness Without Exposed Support Classification: Structures Wound Margin: Flat and Intact Exudate Medium Amount: Exudate Type: Serosanguineous Exudate Color: red, brown Foul Odor After Cleansing: No Slough/Fibrino Yes Wound Bed Granulation Amount: None Present (0%) Exposed Structure Necrotic Amount: Large (67-100%) Fascia Exposed: No Necrotic Quality: Eschar, Adherent Slough Fat Layer (Subcutaneous Tissue) Exposed: Yes Tendon Exposed: No Muscle Exposed: No Joint Exposed: No Bone Exposed: No Periwound Skin Texture Texture  Color [name], Laurana ([id number]) No Abnormalities Noted: No No Abnormalities Noted: No Callus: No Atrophie Blanche: No Crepitus: No Cyanosis: No Excoriation: No Ecchymosis: No Induration: No Erythema: No Rash: No Hemosiderin Staining: No Scarring: No Mottled: No Pallor: No Moisture Rubor: No No Abnormalities Noted: No Dry / Scaly: No Temperature / Pain Maceration: No Temperature: No Abnormality Tenderness on Palpation: Yes Wound Preparation Ulcer Cleansing: Rinsed/Irrigated with Saline Topical Anesthetic Applied: Other: lidocaine 4%, Treatment Notes Wound #3 (Left Abdomen - midline) 1. Cleansed with: Clean wound with Normal Saline 2. Anesthetic Topical Lidocaine 4% cream to wound bed prior to debridement 3. Peri-wound Care: Antifungal cream 4. Dressing Applied: Other dressing (specify in notes) 5. Secondary Dressing Applied Bordered Foam Dressing Notes endoform, drawtex Electronic Signature(s) Signed: 11/06/2017 11:26:12 AM By: Renne Crigler Entered By: Renne Crigler on 11/06/2017 09:32:07 [name], [name] ([id number]) -------------------------------------------------------------------------------- Vitals Details Patient Name: [name] Date of Service: 11/06/2017 9:15 AM Medical Record Number: [id number] Patient Account Number: [id number] Date of Birth/Sex: [birth year] (42 y.o. F) Treating RN: Renne Crigler Primary Care Tessica Cupo: Cheryll Dessert Other Clinician: Referring Sharnette Kitamura: Cheryll Dessert Treating Balin Vandegrift/Extender: Altamese [place] in Treatment: 2 Vital Signs Time Taken: 09:23 Temperature (F): 98.4 Height (in): 66 Pulse (bpm): 107 Weight (lbs): 385 Respiratory Rate (breaths/min): 18 Body Mass Index (BMI): 62.1 Blood Pressure (mmHg): 118/71 Reference Range: 80 - 120 mg / dl Electronic Signature(s) Signed: 11/06/2017 11:26:12 AM By: Renne Crigler Entered By: Renne Crigler on 11/06/2017 09:25:23

## 2017-11-11 NOTE — Progress Notes (Signed)
NAZIAH, PORTEE ([id number]) Visit Report for 11/06/2017 Debridement Details Patient Name: Brooke ROSECurley, [place] Date of Service: 11/06/2017 9:15 AM Medical Record Number: [id number] Patient Account Number: [id number] Date of Birth/Sex: 12-12-1975 (42 y.o. F) Treating RN: Huel Coventry Primary Care Provider: Cheryll Dessert Other Clinician: Referring Provider: Cheryll Dessert Treating Provider/Extender: Altamese [place] in Treatment: 2 Debridement Performed for Wound #3 Left Abdomen - midline Assessment: Performed By: Physician Maxwell Caul, MD Debridement Type: Debridement Pre-procedure Verification/Time Yes - 09:45 Out Taken: Start Time: 09:45 Pain Control: Other : lidocaine 4% Total Area Debrided (L x W): 0.5 (cm) x 0.9 (cm) = 0.45 (cm) Tissue and other material Viable, Non-Viable, Eschar, Subcutaneous debrided: Level: Skin/Subcutaneous Tissue Debridement Description: Excisional Instrument: Curette Bleeding: Minimum Hemostasis Achieved: Pressure End Time: 09:50 Procedural Pain: 0 Post Procedural Pain: 0 Response to Treatment: Procedure was tolerated well Post Debridement Measurements of Total Wound Length: (cm) 0.5 Width: (cm) 0.9 Depth: (cm) 0.2 Volume: (cm) 0.071 Character of Wound/Ulcer Post Debridement: Stable Post Procedure Diagnosis Same as Pre-procedure Electronic Signature(s) Signed: 11/06/2017 4:49:21 PM By: Baltazar Najjar MD Signed: 11/11/2017 9:08:37 AM By: Elliot Gurney, BSN, RN, CWS, Kim RN, BSN Entered By: Baltazar Najjar on 11/06/2017 10:11:30 [name], Florentina Addison ([id number]) -------------------------------------------------------------------------------- HPI Details Patient Name: [name] Date of Service: 11/06/2017 9:15 AM Medical Record Number: [id number] Patient Account Number: [id number] Date of Birth/Sex: November 18, 1975 (42 y.o. F) Treating RN: Huel Coventry Primary Care Provider: Cheryll Dessert Other Clinician: Referring Provider: Cheryll Dessert Treating Provider/Extender: Altamese [place] in Treatment: 2 History of Present Illness HPI Description: 10/23/17-she is here for initial evaluation of 2 abdominal wounds. She is a type I diabetic with an insulin pump, she developed an abscess to the right side of her abdomen at an insertion sites. She underwent incision and drainage on March 5 and was packed with iodoform packing. The packing was maintained until her follow-up appointment on March 13 at which time it was replaced. She has not been instructed to change packing daily and packing has been maintained. She continues to have some induration and pain although significantly improved. Originally after incision and drainage she was placed on Augmentin and Bactrim which she completed Saturday 3/16. she underwent a biopsy of a mole to her left lateral abdomen and is being followed by dermatology with plans for wide excision of "abnormal cells" in June. She is on chronic suppressive antibiotic therapy (doxycycline) for hidradenitis. Her last A1c was 7.2 in November and states her daily glucose levels are normally in the low 100s. 10/30/17; patient admitted to our clinic last week. She is a type I diabetic. She had an abscess develop on the right side of her lower abdomen at the site of an insulin pump. This underwent an IandD on March 5. She also underwent an unrelated biopsy of a mole on her lateral abdomen by dermatology. This had "abnormal cells" however I have not actually seen this report. She is due for a wider excision soon 11/05/17; patient has 2 wounds on her abdomen one was an abscess site on the right the other a surgical biopsy site. She has been using silver collagen but not making much progress the area on the right still has considerable undermining although the actual tissue looks healthy. The area on the left necrotic surface debris. Changed her to Endoform today Electronic Signature(s) Signed: 11/06/2017  4:49:21 PM By: Baltazar Najjar MD Entered By: Baltazar Najjar on 11/06/2017 10:13:01 [name], Florentina Addison ([id number]) -------------------------------------------------------------------------------- Physical Exam Details  Patient Name: [name] Date of Service: 11/06/2017 9:15 AM Medical Record Number: [id number] Patient Account Number: [id number] Date of Birth/Sex: 1975-12-13 (42 y.o. F) Treating RN: Huel Coventry Primary Care Provider: Cheryll Dessert Other Clinician: Referring Provider: Cheryll Dessert Treating Provider/Extender: Altamese [place] in Treatment: 2 Constitutional Sitting or standing Blood Pressure is within target range for patient.. Pulse regular and within target range for patient.Marland Kitchen Respirations regular, non-labored and within target range.. Temperature is normal and within the target range for the patient.Marland Kitchen appears in no distress. Integumentary (Hair, Skin) appears to have a fungal dermatitis on the left under some pannus. Notes wound exam; the patient has 2 separate wounds. oOn the right abdomen a circular punched out area with some depth. Surface granulation looks healthy however there is the same amount of undermining roughly from 10 to 4:00. oThe biopsy site on the left covered in tightly adherent black surface eschar. Using a #3 curet this was debrided of eschar and subcutaneous tissue. Post debridement looks quite healthy Electronic Signature(s) Signed: 11/06/2017 4:49:21 PM By: Baltazar Najjar MD Entered By: Baltazar Najjar on 11/06/2017 10:14:50 [name], Florentina Addison ([id number]) -------------------------------------------------------------------------------- Physician Orders Details Patient Name: [name] Date of Service: 11/06/2017 9:15 AM Medical Record Number: [id number] Patient Account Number: [id number] Date of Birth/Sex: Dec 08, 1975 (42 y.o. F) Treating RN: Huel Coventry Primary Care Provider: Cheryll Dessert Other Clinician: Referring Provider:  Cheryll Dessert Treating Provider/Extender: Altamese [place] in Treatment: 2 Verbal / Phone Orders: No Diagnosis Coding Wound Cleansing o Clean wound with Normal Saline. o May Shower, gently pat wound dry prior to applying new dressing. Anesthetic (add to Medication List) o Topical Lidocaine 4% cream applied to wound bed prior to debridement (In Clinic Only). Skin Barriers/Peri-Wound Care o Other: - lotrimin cream to reddened areas Primary Wound Dressing o Other: - Endoform all wounds Secondary Dressing o Boardered Foam Dressing - all wounds o Drawtex Dressing Change Frequency o Change dressing every other day. Follow-up Appointments o Return Appointment in 1 week. Additional Orders / Instructions o Increase protein intake. o Activity as tolerated Electronic Signature(s) Signed: 11/06/2017 4:49:21 PM By: Baltazar Najjar MD Signed: 11/11/2017 9:08:37 AM By: Elliot Gurney, BSN, RN, CWS, Kim RN, BSN Entered By: Elliot Gurney, BSN, RN, CWS, Kim on 11/06/2017 09:55:11 [name], Florentina Addison ([id number]) -------------------------------------------------------------------------------- Problem List Details Patient Name: [name] Date of Service: 11/06/2017 9:15 AM Medical Record Number: [id number] Patient Account Number: [id number] Date of Birth/Sex: 06-01-1976 (42 y.o. F) Treating RN: Huel Coventry Primary Care Provider: Cheryll Dessert Other Clinician: Referring Provider: Cheryll Dessert Treating Provider/Extender: Altamese [place] in Treatment: 2 Active Problems ICD-10 Impacting Encounter Code Description Active Date Wound Healing Diagnosis L02.211 Cutaneous abscess of abdominal wall 10/23/2017 Yes S31.109S Unspecified open wound of abdominal wall, unspecified 10/23/2017 Yes quadrant without penetration into peritoneal cavity, sequela E11.622 Type 2 diabetes mellitus with other skin ulcer 10/23/2017 Yes E66.01 Morbid (severe) obesity due to excess calories  10/23/2017 Yes Inactive Problems Resolved Problems Electronic Signature(s) Signed: 11/06/2017 4:49:21 PM By: Baltazar Najjar MD Entered By: Baltazar Najjar on 11/06/2017 10:09:52 [name], Florentina Addison ([id number]) -------------------------------------------------------------------------------- Progress Note Details Patient Name: [name] Date of Service: 11/06/2017 9:15 AM Medical Record Number: [id number] Patient Account Number: [id number] Date of Birth/Sex: [birth year] (42 y.o. F) Treating RN: Huel Coventry Primary Care Provider: Cheryll Dessert Other Clinician: Referring Provider: Cheryll Dessert Treating Provider/Extender: Altamese [place] in Treatment: 2 Subjective History of Present Illness (HPI) 10/23/17-she is here for initial evaluation of 2  abdominal wounds. She is a type I diabetic with an insulin pump, she developed an abscess to the right side of her abdomen at an insertion sites. She underwent incision and drainage on March 5 and was packed with iodoform packing. The packing was maintained until her follow-up appointment on March 13 at which time it was replaced. She has not been instructed to change packing daily and packing has been maintained. She continues to have some induration and pain although significantly improved. Originally after incision and drainage she was placed on Augmentin and Bactrim which she completed Saturday 3/16. she underwent a biopsy of a mole to her left lateral abdomen and is being followed by dermatology with plans for wide excision of "abnormal cells" in June. She is on chronic suppressive antibiotic therapy (doxycycline) for hidradenitis. Her last A1c was 7.2 in November and states her daily glucose levels are normally in the low 100s. 10/30/17; patient admitted to our clinic last week. She is a type I diabetic. She had an abscess develop on the right side of her lower abdomen at the site of an insulin pump. This underwent an IandD on March 5.  She also underwent an unrelated biopsy of a mole on her lateral abdomen by dermatology. This had "abnormal cells" however I have not actually seen this report. She is due for a wider excision soon 11/05/17; patient has 2 wounds on her abdomen one was an abscess site on the right the other a surgical biopsy site. She has been using silver collagen but not making much progress the area on the right still has considerable undermining although the actual tissue looks healthy. The area on the left necrotic surface debris. Changed her to Endoform today Objective Constitutional Sitting or standing Blood Pressure is within target range for patient.. Pulse regular and within target range for patient.Marland Kitchen. Respirations regular, non-labored and within target range.. Temperature is normal and within the target range for the patient.Marland Kitchen. appears in no distress. Vitals Time Taken: 9:23 AM, Height: 66 in, Weight: 385 lbs, BMI: 62.1, Temperature: 98.4 F, Pulse: 107 bpm, Respiratory Rate: 18 breaths/min, Blood Pressure: 118/71 mmHg. General Notes: wound exam; the patient has 2 separate wounds. On the right abdomen a circular punched out area with some depth. Surface granulation looks healthy however there is the same amount of undermining roughly from 10 to 4:00. The biopsy site on the left covered in tightly adherent black surface eschar. Using a #3 curet this was debrided of eschar and subcutaneous tissue. Post debridement looks quite healthy Integumentary (Hair, Skin) appears to have a fungal dermatitis on the left under some pannus. [name], Sarenity ([id number]) Wound #2 status is Open. Original cause of wound was Bump. The wound is located on the Right Abdomen - midline. The wound measures 0.9cm length x 0.9cm width x 0.3cm depth; 0.636cm^2 area and 0.191cm^3 volume. There is Fat Layer (Subcutaneous Tissue) Exposed exposed. There is no tunneling noted, however, there is undermining starting at 7:00 and ending at  4:00 with a maximum distance of 0.5cm. There is a large amount of serous drainage noted. The wound margin is flat and intact. There is large (67-100%) red granulation within the wound bed. There is a small (1-33%) amount of necrotic tissue within the wound bed including Adherent Slough. The periwound skin appearance exhibited: Induration, Ecchymosis. The periwound skin appearance did not exhibit: Callus, Crepitus, Excoriation, Rash, Scarring, Dry/Scaly, Maceration, Atrophie Blanche, Cyanosis, Hemosiderin Staining, Mottled, Pallor, Rubor, Erythema. Periwound temperature was noted as No Abnormality.  The periwound has tenderness on palpation. Wound #3 status is Open. Original cause of wound was Not Known. The wound is located on the Left Abdomen - midline. The wound measures 0.5cm length x 0.9cm width x 0.1cm depth; 0.353cm^2 area and 0.035cm^3 volume. There is Fat Layer (Subcutaneous Tissue) Exposed exposed. There is no tunneling or undermining noted. There is a medium amount of serosanguineous drainage noted. The wound margin is flat and intact. There is no granulation within the wound bed. There is a large (67-100%) amount of necrotic tissue within the wound bed including Eschar and Adherent Slough. The periwound skin appearance did not exhibit: Callus, Crepitus, Excoriation, Induration, Rash, Scarring, Dry/Scaly, Maceration, Atrophie Blanche, Cyanosis, Ecchymosis, Hemosiderin Staining, Mottled, Pallor, Rubor, Erythema. Periwound temperature was noted as No Abnormality. The periwound has tenderness on palpation. Assessment Active Problems ICD-10 L02.211 - Cutaneous abscess of abdominal wall S31.109S - Unspecified open wound of abdominal wall, unspecified quadrant without penetration into peritoneal cavity, sequela E11.622 - Type 2 diabetes mellitus with other skin ulcer E66.01 - Morbid (severe) obesity due to excess calories Procedures Wound #3 Pre-procedure diagnosis of Wound #3 is an  Atypical located on the Left Abdomen - midline . There was a Excisional Skin/Subcutaneous Tissue Debridement with a total area of 0.45 sq cm performed by Maxwell Caul, MD. With the following instrument(s): Curette. to remove Viable and Non-Viable tissue/material Material removed includes Eschar and Subcutaneous Tissue and after achieving pain control using Other (lidocaine 4%). No specimens were taken. A time out was conducted at 09:45, prior to the start of the procedure. A Minimum amount of bleeding was controlled with Pressure. The procedure was tolerated well with a pain level of 0 throughout and a pain level of 0 following the procedure. Post Debridement Measurements: 0.5cm length x 0.9cm width x 0.2cm depth; 0.071cm^3 volume. Character of Wound/Ulcer Post Debridement is stable. Post procedure Diagnosis Wound #3: Same as Pre-Procedure Plan [name], Morganna ([id number]) Wound Cleansing: Clean wound with Normal Saline. May Shower, gently pat wound dry prior to applying new dressing. Anesthetic (add to Medication List): Topical Lidocaine 4% cream applied to wound bed prior to debridement (In Clinic Only). Skin Barriers/Peri-Wound Care: Other: - lotrimin cream to reddened areas Primary Wound Dressing: Other: - Endoform all wounds Secondary Dressing: Boardered Foam Dressing - all wounds Drawtex Dressing Change Frequency: Change dressing every other day. Follow-up Appointments: Return Appointment in 1 week. Additional Orders / Instructions: Increase protein intake. Activity as tolerated #1 Lotrimin cream to the reddened area/dermatitis under a fold just above the wound on the left #2 I change the primary dressing to both areas to Endoform. I'm hopefully going to stimulate some granulation especially on the right. Electronic Signature(s) Signed: 11/06/2017 4:49:21 PM By: Baltazar Najjar MD Entered By: Baltazar Najjar on 11/06/2017 10:16:02 [name], Florentina Addison  ([id number]) -------------------------------------------------------------------------------- SuperBill Details Patient Name: [name] Date of Service: 11/06/2017 Medical Record Number: [id number] Patient Account Number: [id number] Date of Birth/Sex: October 19, 1975 (42 y.o. F) Treating RN: Huel Coventry Primary Care Provider: Cheryll Dessert Other Clinician: Referring Provider: Cheryll Dessert Treating Provider/Extender: Altamese [place] in Treatment: 2 Diagnosis Coding ICD-10 Codes Code Description L02.211 Cutaneous abscess of abdominal wall Unspecified open wound of abdominal wall, unspecified quadrant without penetration into peritoneal S31.109S cavity, sequela E11.622 Type 2 diabetes mellitus with other skin ulcer E66.01 Morbid (severe) obesity due to excess calories Facility Procedures CPT4: Description Modifier Quantity Code [id number] 11042 - DEB SUBQ TISSUE 20 SQ CM/< 1 ICD-10 Diagnosis Description S31.109S  Unspecified open wound of abdominal wall, unspecified quadrant without penetration into peritoneal cavity, sequela Physician Procedures CPT4: Description Modifier Quantity Code [id number] 11042 - WC PHYS SUBQ TISS 20 SQ CM 1 ICD-10 Diagnosis Description S31.109S Unspecified open wound of abdominal wall, unspecified quadrant without penetration into peritoneal cavity, sequela Electronic Signature(s) Signed: 11/06/2017 4:49:21 PM By: Baltazar Najjar MD Entered By: Baltazar Najjar on 11/06/2017 10:16:58

## 2017-11-13 ENCOUNTER — Encounter: Payer: Medicaid Other | Admitting: Internal Medicine

## 2017-11-13 DIAGNOSIS — L02211 Cutaneous abscess of abdominal wall: Secondary | ICD-10-CM | POA: Diagnosis not present

## 2017-11-15 NOTE — Progress Notes (Addendum)
ZENIYA, LAPIDUS ([id number]) Visit Report for 11/13/2017 Debridement Details Patient Name: Brooke ROSERamesha, Poster Date of Service: 11/13/2017 11:15 AM Medical Record Number: [id number] Patient Account Number: [id number] Date of Birth/Sex: [birth year] (42 y.o. F) Treating RN: Huel Coventry Primary Care Provider: Cheryll Dessert Other Clinician: Referring Provider: Cheryll Dessert Treating Provider/Extender: Altamese [place] in Treatment: 3 Debridement Performed for Wound #3 Left Abdomen - midline Assessment: Performed By: Physician Maxwell Caul, MD Debridement Type: Debridement Pre-procedure Verification/Time Yes - 11:40 Out Taken: Start Time: 11:40 Pain Control: Other : lidocane 4% Total Area Debrided (L x W): 0.6 (cm) x 0.5 (cm) = 0.3 (cm) Tissue and other material Non-Viable, Eschar, Subcutaneous debrided: Level: Skin/Subcutaneous Tissue Debridement Description: Excisional Instrument: Curette Bleeding: Minimum Hemostasis Achieved: Pressure End Time: 11:41 Procedural Pain: 0 Post Procedural Pain: 0 Response to Treatment: Procedure was tolerated well Post Debridement Measurements of Total Wound Length: (cm) 0.6 Width: (cm) 0.5 Depth: (cm) 0.1 Volume: (cm) 0.024 Character of Wound/Ulcer Post Debridement: Stable Post Procedure Diagnosis Same as Pre-procedure Electronic Signature(s) Signed: 11/13/2017 1:43:28 PM By: Elliot Gurney, BSN, RN, CWS, Kim RN, BSN Signed: 11/13/2017 5:02:04 PM By: Baltazar Najjar MD Entered By: Baltazar Najjar on 11/13/2017 12:12:10 [name], [name] ([id number]) -------------------------------------------------------------------------------- HPI Details Patient Name: [name] Date of Service: 11/13/2017 11:15 AM Medical Record Number: [id number] Patient Account Number: [id number] Date of Birth/Sex: 08-19-1975 (42 y.o. F) Treating RN: Huel Coventry Primary Care Provider: Cheryll Dessert Other Clinician: Referring Provider: Cheryll Dessert Treating Provider/Extender: Altamese [place] in Treatment: 3 History of Present Illness HPI Description: 10/23/17-she is here for initial evaluation of 2 abdominal wounds. She is a type I diabetic with an insulin pump, she developed an abscess to the right side of her abdomen at an insertion sites. She underwent incision and drainage on March 5 and was packed with iodoform packing. The packing was maintained until her follow-up appointment on March 13 at which time it was replaced. She has not been instructed to change packing daily and packing has been maintained. She continues to have some induration and pain although significantly improved. Originally after incision and drainage she was placed on Augmentin and Bactrim which she completed Saturday 3/16. she underwent a biopsy of a mole to her left lateral abdomen and is being followed by dermatology with plans for wide excision of "abnormal cells" in June. She is on chronic suppressive antibiotic therapy (doxycycline) for hidradenitis. Her last A1c was 7.2 in November and states her daily glucose levels are normally in the low 100s. 10/30/17; patient admitted to our clinic last week. She is a type I diabetic. She had an abscess develop on the right side of her lower abdomen at the site of an insulin pump. This underwent an IandD on March 5. She also underwent an unrelated biopsy of a mole on her lateral abdomen by dermatology. This had "abnormal cells" however I have not actually seen this report. She is due for a wider excision soon 11/05/17; patient has 2 wounds on her abdomen one was an abscess site on the right the other a surgical biopsy site. She has been using silver collagen but not making much progress the area on the right still has considerable undermining although the actual tissue looks healthy. The area on the left necrotic surface debris. Changed her to Endoform today 11/13/17; 2 wounds on her abdomen. On one on the  right side was an abscess site and the other on the left a surgical biopsy site.  It's a surgical biopsy site that seems to be more difficult. Using Endoform the area on the right which was an abscess IandD looks a lot better. Less undermining healthier looking tissue still requires debridement Electronic Signature(s) Signed: 11/13/2017 5:02:04 PM By: Baltazar Najjar MD Entered By: Baltazar Najjar on 11/13/2017 12:08:42 [name], [name] ([id number]) -------------------------------------------------------------------------------- Physical Exam Details Patient Name: [name] Date of Service: 11/13/2017 11:15 AM Medical Record Number: [id number] Patient Account Number: [id number] Date of Birth/Sex: September 18, 1975 (42 y.o. F) Treating RN: Huel Coventry Primary Care Provider: Cheryll Dessert Other Clinician: Referring Provider: Cheryll Dessert Treating Provider/Extender: Altamese [place] in Treatment: 3 Notes wound exam o2 separate wounds on the right abdomen and the circular punched out area appears more superficial. Undermining his come in. Surface granulation looks better using Endoform. No debridement was required here oThe biopsy site on the left again has a tightly adherent black eschar. I'm really not sure of the etiology of this. Nor do I actually have the biopsy results. Time I debrided this with a #5 curet I remove eschar and some subcutaneous tissue. Post debridement the wound actually looks healthy however the next week it is roughly the same Electronic Signature(s) Signed: 11/13/2017 5:02:04 PM By: Baltazar Najjar MD Entered By: Baltazar Najjar on 11/13/2017 12:11:01 [name], [name] ([id number]) -------------------------------------------------------------------------------- Physician Orders Details Patient Name: [name] Date of Service: 11/13/2017 11:15 AM Medical Record Number: [id number] Patient Account Number: [id number] Date of Birth/Sex: 1975/12/31 (42 y.o.  F) Treating RN: Huel Coventry Primary Care Provider: Cheryll Dessert Other Clinician: Referring Provider: Cheryll Dessert Treating Provider/Extender: Altamese [place] in Treatment: 3 Verbal / Phone Orders: No Diagnosis Coding Wound Cleansing Wound #2 Right Abdomen - midline o Clean wound with Normal Saline. o May Shower, gently pat wound dry prior to applying new dressing. Wound #3 Left Abdomen - midline o Clean wound with Normal Saline. o May Shower, gently pat wound dry prior to applying new dressing. Anesthetic (add to Medication List) Wound #2 Right Abdomen - midline o Topical Lidocaine 4% cream applied to wound bed prior to debridement (In Clinic Only). Wound #3 Left Abdomen - midline o Topical Lidocaine 4% cream applied to wound bed prior to debridement (In Clinic Only). Skin Barriers/Peri-Wound Care Wound #2 Right Abdomen - midline o Other: - lotrimin cream to reddened areas Wound #3 Left Abdomen - midline o Other: - lotrimin cream to reddened areas Primary Wound Dressing Wound #2 Right Abdomen - midline o Other: - Endoform all wounds Wound #3 Left Abdomen - midline o Other: - Endoform all wounds Secondary Dressing Wound #2 Right Abdomen - midline o Boardered Foam Dressing - all wounds Wound #3 Left Abdomen - midline o Boardered Foam Dressing - all wounds Dressing Change Frequency Wound #2 Right Abdomen - midline o Change dressing every other day. Wound #3 Left Abdomen - midline o Change dressing every other day. [name], [name] ([id number]) Follow-up Appointments Wound #2 Right Abdomen - midline o Return Appointment in 1 week. Wound #3 Left Abdomen - midline o Return Appointment in 1 week. Additional Orders / Instructions Wound #2 Right Abdomen - midline o Increase protein intake. o Activity as tolerated Wound #3 Left Abdomen - midline o Increase protein intake. o Activity as tolerated Electronic  Signature(s) Signed: 11/13/2017 1:43:28 PM By: Elliot Gurney, BSN, RN, CWS, Kim RN, BSN Signed: 11/13/2017 5:02:04 PM By: Baltazar Najjar MD Entered By: Elliot Gurney, BSN, RN, CWS, Kim on 11/13/2017 11:43:37 [name], [name] ([id number]) -------------------------------------------------------------------------------- Problem List  Details Patient Name: [name], [name] Date of Service: 11/13/2017 11:15 AM Medical Record Number: [id number] Patient Account Number: [id number] Date of Birth/Sex: 07-22-1976 (42 y.o. F) Treating RN: Huel Coventry Primary Care Provider: Cheryll Dessert Other Clinician: Referring Provider: Cheryll Dessert Treating Provider/Extender: Altamese [place] in Treatment: 3 Active Problems ICD-10 Impacting Encounter Code Description Active Date Wound Healing Diagnosis L02.211 Cutaneous abscess of abdominal wall 10/23/2017 Yes S31.109S Unspecified open wound of abdominal wall, unspecified 10/23/2017 Yes quadrant without penetration into peritoneal cavity, sequela E11.622 Type 2 diabetes mellitus with other skin ulcer 10/23/2017 Yes E66.01 Morbid (severe) obesity due to excess calories 10/23/2017 Yes Inactive Problems Resolved Problems Electronic Signature(s) Signed: 11/13/2017 5:02:04 PM By: Baltazar Najjar MD Entered By: Baltazar Najjar on 11/13/2017 12:06:48 [name], [name] ([id number]) -------------------------------------------------------------------------------- Progress Note Details Patient Name: [name] Date of Service: 11/13/2017 11:15 AM Medical Record Number: [id number] Patient Account Number: [id number] Date of Birth/Sex: [birth year] (42 y.o. F) Treating RN: Huel Coventry Primary Care Provider: Cheryll Dessert Other Clinician: Referring Provider: Cheryll Dessert Treating Provider/Extender: Altamese [place] in Treatment: 3 Subjective History of Present Illness (HPI) 10/23/17-she is here for initial evaluation of 2 abdominal wounds. She is a type I diabetic  with an insulin pump, she developed an abscess to the right side of her abdomen at an insertion sites. She underwent incision and drainage on March 5 and was packed with iodoform packing. The packing was maintained until her follow-up appointment on March 13 at which time it was replaced. She has not been instructed to change packing daily and packing has been maintained. She continues to have some induration and pain although significantly improved. Originally after incision and drainage she was placed on Augmentin and Bactrim which she completed Saturday 3/16. she underwent a biopsy of a mole to her left lateral abdomen and is being followed by dermatology with plans for wide excision of "abnormal cells" in June. She is on chronic suppressive antibiotic therapy (doxycycline) for hidradenitis. Her last A1c was 7.2 in November and states her daily glucose levels are normally in the low 100s. 10/30/17; patient admitted to our clinic last week. She is a type I diabetic. She had an abscess develop on the right side of her lower abdomen at the site of an insulin pump. This underwent an IandD on March 5. She also underwent an unrelated biopsy of a mole on her lateral abdomen by dermatology. This had "abnormal cells" however I have not actually seen this report. She is due for a wider excision soon 11/05/17; patient has 2 wounds on her abdomen one was an abscess site on the right the other a surgical biopsy site. She has been using silver collagen but not making much progress the area on the right still has considerable undermining although the actual tissue looks healthy. The area on the left necrotic surface debris. Changed her to Endoform today 11/13/17; 2 wounds on her abdomen. On one on the right side was an abscess site and the other on the left a surgical biopsy site. It's a surgical biopsy site that seems to be more difficult. Using Endoform the area on the right which was an abscess IandD looks a  lot better. Less undermining healthier looking tissue still requires debridement Objective Constitutional Vitals Time Taken: 11:26 AM, Height: 66 in, Weight: 385 lbs, BMI: 62.1, Temperature: 98.3 F, Pulse: 90 bpm, Respiratory Rate: 18 breaths/min, Blood Pressure: 127/75 mmHg. Integumentary (Hair, Skin) Wound #2 status is Open. Original cause of wound  was Bump. The wound is located on the Right Abdomen - midline. The wound measures 0.7cm length x 0.9cm width x 0.1cm depth; 0.495cm^2 area and 0.049cm^3 volume. There is Fat Layer (Subcutaneous Tissue) Exposed exposed. There is no tunneling noted, however, there is undermining starting at 1:00 and ending at 5:00 with a maximum distance of 0.4cm. There is a large amount of serous drainage noted. The wound margin is flat and intact. There is large (67-100%) red granulation within the wound bed. There is no necrotic tissue within the wound bed. The periwound skin appearance exhibited: Induration, Ecchymosis. The periwound skin appearance did not exhibit: Callus, Crepitus, Excoriation, Rash, Scarring, Dry/Scaly, Maceration, Atrophie Blanche, Cyanosis, Hemosiderin Staining, Mottled, Pallor, Rubor, Erythema. Periwound temperature was noted as No Abnormality. The periwound has tenderness on palpation. [name], [name] ([id number]) Wound #3 status is Open. Original cause of wound was Not Known. The wound is located on the Left Abdomen - midline. The wound measures 0.6cm length x 0.5cm width x 0.1cm depth; 0.236cm^2 area and 0.024cm^3 volume. There is Fat Layer (Subcutaneous Tissue) Exposed exposed. There is no tunneling or undermining noted. There is a none present amount of drainage noted. The wound margin is flat and intact. There is no granulation within the wound bed. There is a large (67-100%) amount of necrotic tissue within the wound bed including Eschar. The periwound skin appearance did not exhibit: Callus, Crepitus, Excoriation, Induration, Rash,  Scarring, Dry/Scaly, Maceration, Atrophie Blanche, Cyanosis, Ecchymosis, Hemosiderin Staining, Mottled, Pallor, Rubor, Erythema. Periwound temperature was noted as No Abnormality. The periwound has tenderness on palpation. Assessment Active Problems ICD-10 L02.211 - Cutaneous abscess of abdominal wall S31.109S - Unspecified open wound of abdominal wall, unspecified quadrant without penetration into peritoneal cavity, sequela E11.622 - Type 2 diabetes mellitus with other skin ulcer E66.01 - Morbid (severe) obesity due to excess calories Procedures Wound #3 Pre-procedure diagnosis of Wound #3 is an Atypical located on the Left Abdomen - midline . There was a Excisional Skin/Subcutaneous Tissue Debridement with a total area of 0.3 sq cm performed by Maxwell Caul, MD. With the following instrument(s): Curette. to remove Non-Viable tissue/material Material removed includes Eschar and Subcutaneous Tissue and after achieving pain control using Other (lidocane 4%). A time out was conducted at 11:40, prior to the start of the procedure. A Minimum amount of bleeding was controlled with Pressure. The procedure was tolerated well with a pain level of 0 throughout and a pain level of 0 following the procedure. Post Debridement Measurements: 0.6cm length x 0.5cm width x 0.1cm depth; 0.024cm^3 volume. Character of Wound/Ulcer Post Debridement is stable. Post procedure Diagnosis Wound #3: Same as Pre-Procedure Plan Wound Cleansing: Wound #2 Right Abdomen - midline: Clean wound with Normal Saline. May Shower, gently pat wound dry prior to applying new dressing. Wound #3 Left Abdomen - midline: Clean wound with Normal Saline. May Shower, gently pat wound dry prior to applying new dressing. Anesthetic (add to Medication List): Wound #2 Right Abdomen - midline: Topical Lidocaine 4% cream applied to wound bed prior to debridement (In Clinic Only). [name], [name] ([id number]) Wound #3 Left Abdomen  - midline: Topical Lidocaine 4% cream applied to wound bed prior to debridement (In Clinic Only). Skin Barriers/Peri-Wound Care: Wound #2 Right Abdomen - midline: Other: - lotrimin cream to reddened areas Wound #3 Left Abdomen - midline: Other: - lotrimin cream to reddened areas Primary Wound Dressing: Wound #2 Right Abdomen - midline: Other: - Endoform all wounds Wound #3 Left Abdomen - midline:  Other: - Endoform all wounds Secondary Dressing: Wound #2 Right Abdomen - midline: Boardered Foam Dressing - all wounds Wound #3 Left Abdomen - midline: Boardered Foam Dressing - all wounds Dressing Change Frequency: Wound #2 Right Abdomen - midline: Change dressing every other day. Wound #3 Left Abdomen - midline: Change dressing every other day. Follow-up Appointments: Wound #2 Right Abdomen - midline: Return Appointment in 1 week. Wound #3 Left Abdomen - midline: Return Appointment in 1 week. Additional Orders / Instructions: Wound #2 Right Abdomen - midline: Increase protein intake. Activity as tolerated Wound #3 Left Abdomen - midline: Increase protein intake. Activity as tolerated #1 I'm continuing Endoform to both wound areas #2 I may need to get the biopsy site results on the left Electronic Signature(s) Signed: 11/18/2017 2:39:22 PM By: Elliot Gurney, BSN, RN, CWS, Kim RN, BSN Signed: 11/20/2017 5:09:03 PM By: Baltazar Najjar MD Previous Signature: 11/13/2017 5:02:04 PM Version By: Baltazar Najjar MD Entered By: Elliot Gurney, BSN, RN, CWS, Kim on 11/18/2017 14:39:22 [name], [name] ([id number]) -------------------------------------------------------------------------------- SuperBill Details Patient Name: [name] Date of Service: 11/13/2017 Medical Record Number: [id number] Patient Account Number: [id number] Date of Birth/Sex: 08-26-1975 (42 y.o. F) Treating RN: Huel Coventry Primary Care Provider: Cheryll Dessert Other Clinician: Referring Provider: Cheryll Dessert Treating  Provider/Extender: Altamese [place] in Treatment: 3 Diagnosis Coding ICD-10 Codes Code Description L02.211 Cutaneous abscess of abdominal wall Unspecified open wound of abdominal wall, unspecified quadrant without penetration into peritoneal S31.109S cavity, sequela E11.622 Type 2 diabetes mellitus with other skin ulcer E66.01 Morbid (severe) obesity due to excess calories Facility Procedures CPT4: Description Modifier Quantity Code [id number] - DEBRIDE WOUND 1ST 20 SQ CM OR < 1 ICD-10 Diagnosis Description S31.109S Unspecified open wound of abdominal wall, unspecified quadrant without penetration into peritoneal cavity, sequela Physician Procedures CPT4: Description Modifier Quantity Code [id number] - WC PHYS DEBR WO ANESTH 20 SQ CM 1 ICD-10 Diagnosis Description S31.109S Unspecified open wound of abdominal wall, unspecified quadrant without penetration into peritoneal cavity, sequela Electronic Signature(s) Signed: 11/18/2017 2:39:00 PM By: Elliot Gurney, BSN, RN, CWS, Kim RN, BSN Signed: 11/20/2017 5:09:03 PM By: Baltazar Najjar MD Previous Signature: 11/13/2017 5:02:04 PM Version By: Baltazar Najjar MD Entered By: Elliot Gurney, BSN, RN, CWS, Kim on 11/18/2017 14:39:00

## 2017-11-15 NOTE — Progress Notes (Signed)
Brooke CoonDUKE Day, Darchelle ([id number]) Visit Report for 11/13/2017 Arrival Information Details Patient Name: [name], Shenique Date of Service: 11/13/2017 11:15 AM Medical Record Number: [id number] Patient Account Number: [id number] Date of Birth/Sex: 04/11/1976 ([age] y.o. F) Treating RN: Curtis Sitesorthy, Joanna Primary Care Clarissia Mckeen: Cheryll DessertGEYER, KATHERINE Other Clinician: Referring Arminta Gamm: Cheryll DessertGEYER, KATHERINE Treating Gabrian Hoque/Extender: Altamese CarolinaOBSON, MICHAEL G Weeks in Treatment: 3 Visit Information History Since Last Visit Added or deleted any medications: No Patient Arrived: Wheel Chair Any new allergies or adverse reactions: No Arrival Time: 11:25 Had a fall or experienced change in No activities of daily living that may affect Accompanied By: boyfriend risk of falls: Transfer Assistance: None Signs or symptoms of abuse/neglect since last visito No Patient Identification Verified: Yes Hospitalized since last visit: No Secondary Verification Process Completed: Yes Implantable device outside of the clinic excluding No Patient Requires Transmission-Based No cellular tissue based products placed in the center Precautions: since last visit: Patient Has Alerts: Yes Has Dressing in Place as Prescribed: Yes Patient Alerts: DMII Pain Present Now: No aspirin 81 Electronic Signature(s) Signed: 11/13/2017 5:03:40 PM By: Curtis Sitesorthy, Joanna Entered By: Curtis Sitesorthy, Joanna on 11/13/2017 11:25:47 [name], [name] ([id number]) -------------------------------------------------------------------------------- Encounter Discharge Information Details Patient Name: [name], [name] Date of Service: 11/13/2017 11:15 AM Medical Record Number: [id number] Patient Account Number: [id number] Date of Birth/Sex: 04/11/1976 (42 y.o. F) Treating RN: Huel CoventryWoody, Kim Primary Care Cerena Baine: Cheryll DessertGEYER, KATHERINE Other Clinician: Referring Idaliz Tinkle: Cheryll DessertGEYER, KATHERINE Treating Korah Hufstedler/Extender: Altamese CarolinaOBSON, MICHAEL G Weeks in Treatment: 3 Encounter Discharge  Information Items Discharge Pain Level: 0 Discharge Condition: Stable Ambulatory Status: Wheelchair Discharge Destination: Home Transportation: Private Auto Accompanied By: boyfriend Schedule Follow-up Appointment: Yes Medication Reconciliation completed and No provided to Patient/Care Dhanvi Boesen: Provided on Clinical Summary of Care: 11/13/2017 Form Type Recipient Paper Patient KDR Electronic Signature(s) Signed: 11/13/2017 12:48:09 PM By: Renne CriglerFlinchum, Cheryl Entered By: Renne CriglerFlinchum, Cheryl on 11/13/2017 11:52:28 [name], [name] ([id number]) -------------------------------------------------------------------------------- Lower Extremity Assessment Details Patient Name: [name], [name] Date of Service: 11/13/2017 11:15 AM Medical Record Number: [id number] Patient Account Number: [id number] Date of Birth/Sex: 04/11/1976 (42 y.o. F) Treating RN: Curtis Sitesorthy, Joanna Primary Care Simrit Gohlke: Cheryll DessertGEYER, KATHERINE Other Clinician: Referring Klinton Candelas: Cheryll DessertGEYER, KATHERINE Treating Klye Besecker/Extender: Altamese CarolinaOBSON, MICHAEL G Weeks in Treatment: 3 Electronic Signature(s) Signed: 11/13/2017 5:03:40 PM By: Curtis Sitesorthy, Joanna Entered By: Curtis Sitesorthy, Joanna on 11/13/2017 11:32:53 [name], [name] ([id number]) -------------------------------------------------------------------------------- Multi Wound Chart Details Patient Name: [name], [name] Date of Service: 11/13/2017 11:15 AM Medical Record Number: [id number] Patient Account Number: [id number] Date of Birth/Sex: 04/11/1976 (42 y.o. F) Treating RN: Huel CoventryWoody, Kim Primary Care Vian Fluegel: Cheryll DessertGEYER, KATHERINE Other Clinician: Referring Jamelah Sitzer: Cheryll DessertGEYER, KATHERINE Treating Umer Harig/Extender: Altamese CarolinaOBSON, MICHAEL G Weeks in Treatment: 3 Vital Signs Height(in): 66 Pulse(bpm): 90 Weight(lbs): 385 Blood Pressure(mmHg): 127/75 Body Mass Index(BMI): 62 Temperature(F): 98.3 Respiratory Rate 18 (breaths/min): Photos: [N/A:N/A] Wound Location: Right Abdomen - midline Left Abdomen - midline  N/A Wounding Event: Bump Not Known N/A Primary Etiology: Abscess Atypical N/A Comorbid History: Lymphedema, Hypertension, Lymphedema, Hypertension, N/A Type II Diabetes, Neuropathy Type II Diabetes, Neuropathy Date Acquired: 10/07/2017 08/12/2017 N/A Weeks of Treatment: 3 3 N/A Wound Status: Open Open N/A Measurements L x W x D 0.7x0.9x0.1 0.6x0.5x0.1 N/A (cm) Area (cm) : 0.495 0.236 N/A Volume (cm) : 0.049 0.024 N/A % Reduction in Area: 12.40% 37.40% N/A % Reduction in Volume: 89.20% 36.80% N/A Starting Position 1 1 (o'clock): Ending Position 1 5 (o'clock): Maximum Distance 1 (cm): 0.4 Undermining: Yes No N/A Classification: Full Thickness Without Full Thickness Without N/A Exposed Support Structures Exposed Support Structures Exudate  Amount: Large None Present N/A Exudate Type: Serous N/A N/A Exudate Color: amber N/A N/A Wound Margin: Flat and Intact Flat and Intact N/A Granulation Amount: Large (67-100%) None Present (0%) N/A Granulation Quality: Red N/A N/A Necrotic Amount: None Present (0%) Large (67-100%) N/A [name], [name] ([id number]) Necrotic Tissue: N/A Eschar N/A Exposed Structures: Fat Layer (Subcutaneous Fat Layer (Subcutaneous N/A Tissue) Exposed: Yes Tissue) Exposed: Yes Fascia: No Fascia: No Tendon: No Tendon: No Muscle: No Muscle: No Joint: No Joint: No Bone: No Bone: No Epithelialization: None None N/A Debridement: N/A Debridement - Selective/Open N/A Wound Pre-procedure N/A 11:40 N/A Verification/Time Out Taken: Pain Control: N/A Other N/A Tissue Debrided: N/A Necrotic/Eschar N/A Level: N/A Non-Viable Tissue N/A Debridement Area (sq cm): N/A 0.3 N/A Instrument: N/A Curette N/A Bleeding: N/A Minimum N/A Hemostasis Achieved: N/A Pressure N/A Procedural Pain: N/A 0 N/A Post Procedural Pain: N/A 0 N/A Debridement Treatment N/A Procedure was tolerated well N/A Response: Post Debridement N/A 0.6x0.5x0.1 N/A Measurements L x W x D (cm) Post  Debridement Volume: N/A 0.024 N/A (cm) Periwound Skin Texture: Induration: Yes Excoriation: No N/A Excoriation: No Induration: No Callus: No Callus: No Crepitus: No Crepitus: No Rash: No Rash: No Scarring: No Scarring: No Periwound Skin Moisture: Maceration: No Maceration: No N/A Dry/Scaly: No Dry/Scaly: No Periwound Skin Color: Ecchymosis: Yes Atrophie Blanche: No N/A Atrophie Blanche: No Cyanosis: No Cyanosis: No Ecchymosis: No Erythema: No Erythema: No Hemosiderin Staining: No Hemosiderin Staining: No Mottled: No Mottled: No Pallor: No Pallor: No Rubor: No Rubor: No Temperature: No Abnormality No Abnormality N/A Tenderness on Palpation: Yes Yes N/A Wound Preparation: Ulcer Cleansing: Ulcer Cleansing: N/A Rinsed/Irrigated with Saline Rinsed/Irrigated with Saline Topical Anesthetic Applied: Topical Anesthetic Applied: Other: lidocaine 4% Other: lidocaine 4% Procedures Performed: N/A Debridement N/A Treatment Notes Wound #2 (Right Abdomen - midline) 1. Cleansed with: [name], Eriana ([id number]) Clean wound with Normal Saline 2. Anesthetic Topical Lidocaine 4% cream to wound bed prior to debridement 4. Dressing Applied: Other dressing (specify in notes) 5. Secondary Dressing Applied Bordered Foam Dressing Notes endoform Wound #3 (Left Abdomen - midline) 1. Cleansed with: Clean wound with Normal Saline 2. Anesthetic Topical Lidocaine 4% cream to wound bed prior to debridement 4. Dressing Applied: Other dressing (specify in notes) 5. Secondary Dressing Applied Bordered Foam Dressing Notes endoform Electronic Signature(s) Signed: 11/13/2017 5:02:04 PM By: Baltazar Najjar MD Entered By: Baltazar Najjar on 11/13/2017 12:06:57 [name], [name] ([id number]) -------------------------------------------------------------------------------- Multi-Disciplinary Care Plan Details Patient Name: Brooke Coon Date of Service: 11/13/2017 11:15  AM Medical Record Number: [id number] Patient Account Number: [id number] Date of Birth/Sex: [birth year] (42 y.o. F) Treating RN: Huel Coventry Primary Care Avi Kerschner: Cheryll Dessert Other Clinician: Referring Desjuan Stearns: Cheryll Dessert Treating Darrell Hauk/Extender: Altamese [place] in Treatment: 3 Active Inactive ` Abuse / Safety / Falls / Self Care Management Nursing Diagnoses: Impaired physical mobility Goals: Patient will not experience any injury related to falls Date Initiated: 10/23/2017 Target Resolution Date: 01/11/2018 Goal Status: Active Interventions: Assess fall risk on admission and as needed Notes: ` Nutrition Nursing Diagnoses: Imbalanced nutrition Goals: Patient/caregiver agrees to and verbalizes understanding of need to use nutritional supplements and/or vitamins as prescribed Date Initiated: 10/23/2017 Target Resolution Date: 01/11/2018 Goal Status: Active Interventions: Assess patient nutrition upon admission and as needed per policy Notes: ` Orientation to the Wound Care Program Nursing Diagnoses: Knowledge deficit related to the wound healing center program Goals: Patient/caregiver will verbalize understanding of the Wound Healing Center Program Date Initiated: 10/23/2017 Target Resolution Date:  01/11/2018 Goal Status: Active Interventions: [name], Raylynn ([id number]) Provide education on orientation to the wound center Notes: ` Wound/Skin Impairment Nursing Diagnoses: Impaired tissue integrity Goals: Ulcer/skin breakdown will heal within 14 weeks Date Initiated: 10/23/2017 Target Resolution Date: 01/11/2018 Goal Status: Active Interventions: Assess patient/caregiver ability to obtain necessary supplies Assess patient/caregiver ability to perform ulcer/skin care regimen upon admission and as needed Assess ulceration(s) every visit Notes: Electronic Signature(s) Signed: 11/13/2017 1:43:28 PM By: Elliot Gurney, BSN, RN, CWS, Kim RN, BSN Entered By: Elliot Gurney,  BSN, RN, CWS, Kim on 11/13/2017 11:40:12 [name], [name] ([id number]) -------------------------------------------------------------------------------- Pain Assessment Details Patient Name: Brooke Coon Date of Service: 11/13/2017 11:15 AM Medical Record Number: [id number] Patient Account Number: [id number] Date of Birth/Sex: 1975/12/07 (42 y.o. F) Treating RN: Curtis Sites Primary Care Youcef Klas: Cheryll Dessert Other Clinician: Referring Erla Bacchi: Cheryll Dessert Treating Vaughan Garfinkle/Extender: Altamese [place] in Treatment: 3 Active Problems Location of Pain Severity and Description of Pain Patient Has Paino No Site Locations Pain Management and Medication Current Pain Management: Electronic Signature(s) Signed: 11/13/2017 5:03:40 PM By: Curtis Sites Entered By: Curtis Sites on 11/13/2017 11:25:54 [name], [name] ([id number]) -------------------------------------------------------------------------------- Patient/Caregiver Education Details Patient Name: Brooke Coon Date of Service: 11/13/2017 11:15 AM Medical Record Number: [id number] Patient Account Number: [id number] Date of Birth/Gender: Aug 24, 1975 (42 y.o. F) Treating RN: Renne Crigler Primary Care Physician: Cheryll Dessert Other Clinician: Referring Physician: Cheryll Dessert Treating Physician/Extender: Altamese [place] in Treatment: 3 Education Assessment Education Provided To: Patient and Caregiver Education Topics Provided Wound Debridement: Handouts: Wound Debridement Methods: Explain/Verbal Responses: State content correctly Wound/Skin Impairment: Handouts: Caring for Your Ulcer Methods: Explain/Verbal Responses: State content correctly Electronic Signature(s) Signed: 11/13/2017 12:48:09 PM By: Renne Crigler Entered By: Renne Crigler on 11/13/2017 11:52:50 [name], [name] ([id number]) -------------------------------------------------------------------------------- Wound  Assessment Details Patient Name: Brooke Coon Date of Service: 11/13/2017 11:15 AM Medical Record Number: [id number] Patient Account Number: [id number] Date of Birth/Sex: October 02, 1975 (42 y.o. F) Treating RN: Curtis Sites Primary Care Kagan Hietpas: Cheryll Dessert Other Clinician: Referring Vandell Kun: Cheryll Dessert Treating Delmas Faucett/Extender: Altamese [place] in Treatment: 3 Wound Status Wound Number: 2 Primary Abscess Etiology: Wound Location: Right Abdomen - midline Wound Status: Open Wounding Event: Bump Comorbid Lymphedema, Hypertension, Type II Date Acquired: 10/07/2017 History: Diabetes, Neuropathy Weeks Of Treatment: 3 Clustered Wound: No Photos Photo Uploaded By: Curtis Sites on 11/13/2017 11:53:16 Wound Measurements Length: (cm) 0.7 Width: (cm) 0.9 Depth: (cm) 0.1 Area: (cm) 0.495 Volume: (cm) 0.049 % Reduction in Area: 12.4% % Reduction in Volume: 89.2% Epithelialization: None Tunneling: No Undermining: Yes Starting Position (o'clock): 1 Ending Position (o'clock): 5 Maximum Distance: (cm) 0.4 Wound Description Full Thickness Without Exposed Support Classification: Structures Wound Margin: Flat and Intact Exudate Large Amount: Exudate Type: Serous Exudate Color: amber Foul Odor After Cleansing: No Slough/Fibrino Yes Wound Bed Granulation Amount: Large (67-100%) Exposed Structure Granulation Quality: Red Fascia Exposed: No Necrotic Amount: None Present (0%) Fat Layer (Subcutaneous Tissue) Exposed: Yes [name], Tija ([id number]) Tendon Exposed: No Muscle Exposed: No Joint Exposed: No Bone Exposed: No Periwound Skin Texture Texture Color No Abnormalities Noted: No No Abnormalities Noted: No Callus: No Atrophie Blanche: No Crepitus: No Cyanosis: No Excoriation: No Ecchymosis: Yes Induration: Yes Erythema: No Rash: No Hemosiderin Staining: No Scarring: No Mottled: No Pallor: No Moisture Rubor: No No Abnormalities Noted:  No Dry / Scaly: No Temperature / Pain Maceration: No Temperature: No Abnormality Tenderness on Palpation: Yes Wound Preparation Ulcer Cleansing: Rinsed/Irrigated with Saline Topical Anesthetic Applied: Other: lidocaine  4%, Treatment Notes Wound #2 (Right Abdomen - midline) 1. Cleansed with: Clean wound with Normal Saline 2. Anesthetic Topical Lidocaine 4% cream to wound bed prior to debridement 4. Dressing Applied: Other dressing (specify in notes) 5. Secondary Dressing Applied Bordered Foam Dressing Notes endoform Electronic Signature(s) Signed: 11/13/2017 5:03:40 PM By: Curtis Sites Entered By: Curtis Sites on 11/13/2017 11:31:52 [name], [name] ([id number]) -------------------------------------------------------------------------------- Wound Assessment Details Patient Name: Brooke Coon Date of Service: 11/13/2017 11:15 AM Medical Record Number: [id number] Patient Account Number: [id number] Date of Birth/Sex: 09/23/1975 (42 y.o. F) Treating RN: Curtis Sites Primary Care Danyah Guastella: Cheryll Dessert Other Clinician: Referring Citlaly Camplin: Cheryll Dessert Treating Briane Birden/Extender: Altamese [place] in Treatment: 3 Wound Status Wound Number: 3 Primary Atypical Etiology: Wound Location: Left Abdomen - midline Wound Status: Open Wounding Event: Not Known Comorbid Lymphedema, Hypertension, Type II Date Acquired: 08/12/2017 History: Diabetes, Neuropathy Weeks Of Treatment: 3 Clustered Wound: No Photos Photo Uploaded By: Curtis Sites on 11/13/2017 11:54:08 Wound Measurements Length: (cm) 0.6 Width: (cm) 0.5 Depth: (cm) 0.1 Area: (cm) 0.236 Volume: (cm) 0.024 % Reduction in Area: 37.4% % Reduction in Volume: 36.8% Epithelialization: None Tunneling: No Undermining: No Wound Description Full Thickness Without Exposed Support Classification: Structures Wound Margin: Flat and Intact Exudate None Present Amount: Foul Odor After Cleansing:  No Slough/Fibrino Yes Wound Bed Granulation Amount: None Present (0%) Exposed Structure Necrotic Amount: Large (67-100%) Fascia Exposed: No Necrotic Quality: Eschar Fat Layer (Subcutaneous Tissue) Exposed: Yes Tendon Exposed: No Muscle Exposed: No Joint Exposed: No Bone Exposed: No Periwound Skin Texture Texture Color [name], Amenda ([id number]) No Abnormalities Noted: No No Abnormalities Noted: No Callus: No Atrophie Blanche: No Crepitus: No Cyanosis: No Excoriation: No Ecchymosis: No Induration: No Erythema: No Rash: No Hemosiderin Staining: No Scarring: No Mottled: No Pallor: No Moisture Rubor: No No Abnormalities Noted: No Dry / Scaly: No Temperature / Pain Maceration: No Temperature: No Abnormality Tenderness on Palpation: Yes Wound Preparation Ulcer Cleansing: Rinsed/Irrigated with Saline Topical Anesthetic Applied: Other: lidocaine 4%, Treatment Notes Wound #3 (Left Abdomen - midline) 1. Cleansed with: Clean wound with Normal Saline 2. Anesthetic Topical Lidocaine 4% cream to wound bed prior to debridement 4. Dressing Applied: Other dressing (specify in notes) 5. Secondary Dressing Applied Bordered Foam Dressing Notes endoform Electronic Signature(s) Signed: 11/13/2017 5:03:40 PM By: Curtis Sites Entered By: Curtis Sites on 11/13/2017 11:32:41 [name], [name] ([id number]) -------------------------------------------------------------------------------- Vitals Details Patient Name: Brooke Coon Date of Service: 11/13/2017 11:15 AM Medical Record Number: [id number] Patient Account Number: [id number] Date of Birth/Sex: 28-Mar-1976 (42 y.o. F) Treating RN: Curtis Sites Primary Care Anquanette Bahner: Cheryll Dessert Other Clinician: Referring Baylie Drakes: Cheryll Dessert Treating Mayre Bury/Extender: Altamese [place] in Treatment: 3 Vital Signs Time Taken: 11:26 Temperature (F): 98.3 Height (in): 66 Pulse (bpm): 90 Weight (lbs):  385 Respiratory Rate (breaths/min): 18 Body Mass Index (BMI): 62.1 Blood Pressure (mmHg): 127/75 Reference Range: 80 - 120 mg / dl Electronic Signature(s) Signed: 11/13/2017 5:03:40 PM By: Curtis Sites Entered By: Curtis Sites on 11/13/2017 11:26:12

## 2017-11-20 ENCOUNTER — Ambulatory Visit: Payer: Medicaid Other | Admitting: Internal Medicine

## 2017-11-27 ENCOUNTER — Encounter: Payer: Medicaid Other | Admitting: Internal Medicine

## 2017-11-27 DIAGNOSIS — L02211 Cutaneous abscess of abdominal wall: Secondary | ICD-10-CM | POA: Diagnosis not present

## 2017-12-01 NOTE — Progress Notes (Signed)
[name], [name] ([id number]) Visit Report for 11/27/2017 Arrival Information Details Patient Name: [name], [name] Date of Service: 11/27/2017 3:15 PM Medical Record Number: [id number] Patient Account Number: [id number] Date of Birth/Sex: 04-14-1976 (42 y.o. F) Treating RN: Curtis Sites Primary Care Breah Joa: Cheryll Dessert Other Clinician: Referring Lamis Behrmann: Cheryll Dessert Treating Kaneesha Constantino/Extender: Altamese [place] in Treatment: 5 Visit Information History Since Last Visit Added or deleted any medications: No Patient Arrived: Wheel Chair Any new allergies or adverse reactions: No Arrival Time: 15:21 Had a fall or experienced change in No activities of daily living that may affect Accompanied By: fiance risk of falls: Transfer Assistance: None Signs or symptoms of abuse/neglect since last visito No Patient Identification Verified: Yes Hospitalized since last visit: No Secondary Verification Process Completed: Yes Implantable device outside of the clinic excluding No Patient Requires Transmission-Based No cellular tissue based products placed in the center Precautions: since last visit: Patient Has Alerts: Yes Has Dressing in Place as Prescribed: Yes Patient Alerts: DMII Pain Present Now: No aspirin 81 Electronic Signature(s) Signed: 11/27/2017 5:00:43 PM By: Curtis Sites Entered By: Curtis Sites on 11/27/2017 15:21:52 [name], [name] ([id number]) -------------------------------------------------------------------------------- Encounter Discharge Information Details Patient Name: [name] Date of Service: 11/27/2017 3:15 PM Medical Record Number: [id number] Patient Account Number: [id number] Date of Birth/Sex: Oct 14, 1975 (42 y.o. F) Treating RN: Huel Coventry Primary Care Jarron Curley: Cheryll Dessert Other Clinician: Referring Diandre Merica: Cheryll Dessert Treating Coury Grieger/Extender: Altamese [place] in Treatment: 5 Encounter Discharge Information  Items Discharge Pain Level: 0 Discharge Condition: Stable Ambulatory Status: Wheelchair Discharge Destination: Home Transportation: Private Auto Accompanied By: fiance Schedule Follow-up Appointment: Yes Medication Reconciliation completed and No provided to Patient/Care Tekeyah Santiago: Provided on Clinical Summary of Care: 11/27/2017 Form Type Recipient Paper Patient KDR Electronic Signature(s) Signed: 11/27/2017 4:58:37 PM By: Curtis Sites Entered By: Curtis Sites on 11/27/2017 16:58:37 [name], [name] ([id number]) -------------------------------------------------------------------------------- Lower Extremity Assessment Details Patient Name: [name] Date of Service: 11/27/2017 3:15 PM Medical Record Number: [id number] Patient Account Number: [id number] Date of Birth/Sex: Dec 28, 1975 (42 y.o. F) Treating RN: Curtis Sites Primary Care Lynann Demetrius: Cheryll Dessert Other Clinician: Referring Terasa Orsini: Cheryll Dessert Treating Natascha Edmonds/Extender: Altamese [place] in Treatment: 5 Electronic Signature(s) Signed: 11/27/2017 5:00:43 PM By: Curtis Sites Entered By: Curtis Sites on 11/27/2017 15:30:07 [name], [name] ([id number]) -------------------------------------------------------------------------------- Multi Wound Chart Details Patient Name: [name] Date of Service: 11/27/2017 3:15 PM Medical Record Number: [id number] Patient Account Number: [id number] Date of Birth/Sex: 1976/07/20 (42 y.o. F) Treating RN: Huel Coventry Primary Care Nayana Lenig: Cheryll Dessert Other Clinician: Referring Kinser Fellman: Cheryll Dessert Treating Krishauna Schatzman/Extender: Altamese [place] in Treatment: 5 Vital Signs Height(in): 66 Pulse(bpm): 105 Weight(lbs): 385 Blood Pressure(mmHg): 147/78 Body Mass Index(BMI): 62 Temperature(F): 98.2 Respiratory Rate 18 (breaths/min): Photos: [N/A:N/A] Wound Location: Right Abdomen - midline Left Abdomen - midline N/A Wounding Event:  Bump Not Known N/A Primary Etiology: Abscess Atypical N/A Comorbid History: Lymphedema, Hypertension, Lymphedema, Hypertension, N/A Type II Diabetes, Neuropathy Type II Diabetes, Neuropathy Date Acquired: 10/07/2017 08/12/2017 N/A Weeks of Treatment: 5 5 N/A Wound Status: Open Open N/A Measurements L x W x D 0.6x0.7x0.1 0.4x0.4x0.1 N/A (cm) Area (cm) : 0.33 0.126 N/A Volume (cm) : 0.033 0.013 N/A % Reduction in Area: 41.60% 66.60% N/A % Reduction in Volume: 92.70% 65.80% N/A Starting Position 1 12 (o'clock): Ending Position 1 4 (o'clock): Maximum Distance 1 (cm): 0.3 Undermining: Yes No N/A Classification: Full Thickness Without Full Thickness Without N/A Exposed Support Structures Exposed Support Structures Exudate  Amount: Large None Present N/A Exudate Type: Serous N/A N/A Exudate Color: amber N/A N/A Wound Margin: Flat and Intact Flat and Intact N/A Granulation Amount: Large (67-100%) Medium (34-66%) N/A Granulation Quality: Red Red N/A Necrotic Amount: None Present (0%) Medium (34-66%) N/A Exposed Structures: Fat Layer (Subcutaneous Fat Layer (Subcutaneous N/A Tissue) Exposed: Yes Tissue) Exposed: Yes [name], [name] ([id number]) Fascia: No Fascia: No Tendon: No Tendon: No Muscle: No Muscle: No Joint: No Joint: No Bone: No Bone: No Epithelialization: None None N/A Debridement: N/A Debridement - Excisional N/A Pre-procedure N/A 15:41 N/A Verification/Time Out Taken: Pain Control: N/A Other N/A Tissue Debrided: N/A Subcutaneous, Slough N/A Level: N/A Skin/Subcutaneous Tissue N/A Debridement Area (sq cm): N/A 0.16 N/A Instrument: N/A Curette N/A Bleeding: N/A None N/A Procedural Pain: N/A 0 N/A Post Procedural Pain: N/A 0 N/A Debridement Treatment N/A Procedure was tolerated well N/A Response: Post Debridement N/A 0.4x0.4x0.2 N/A Measurements L x W x D (cm) Post Debridement Volume: N/A 0.025 N/A (cm) Periwound Skin Texture: Induration:  Yes Excoriation: No N/A Excoriation: No Induration: No Callus: No Callus: No Crepitus: No Crepitus: No Rash: No Rash: No Scarring: No Scarring: No Periwound Skin Moisture: Maceration: No Maceration: No N/A Dry/Scaly: No Dry/Scaly: No Periwound Skin Color: Ecchymosis: Yes Atrophie Blanche: No N/A Atrophie Blanche: No Cyanosis: No Cyanosis: No Ecchymosis: No Erythema: No Erythema: No Hemosiderin Staining: No Hemosiderin Staining: No Mottled: No Mottled: No Pallor: No Pallor: No Rubor: No Rubor: No Temperature: No Abnormality No Abnormality N/A Tenderness on Palpation: Yes Yes N/A Wound Preparation: Ulcer Cleansing: Ulcer Cleansing: N/A Rinsed/Irrigated with Saline Rinsed/Irrigated with Saline Topical Anesthetic Applied: Topical Anesthetic Applied: Other: lidocaine 4% Other: lidocaine 4% Procedures Performed: N/A Debridement N/A Treatment Notes Wound #2 (Right Abdomen - midline) 1. Cleansed with: Clean wound with Normal Saline 2. Anesthetic Topical Lidocaine 4% cream to wound bed prior to debridement 4. Dressing Applied: Other dressing (specify in notes) [name], [name] ([id number]) 5. Secondary Dressing Applied Bordered Foam Dressing Notes endoform Wound #3 (Left Abdomen - midline) 1. Cleansed with: Clean wound with Normal Saline 2. Anesthetic Topical Lidocaine 4% cream to wound bed prior to debridement 4. Dressing Applied: Other dressing (specify in notes) 5. Secondary Dressing Applied Bordered Foam Dressing Notes endoform Electronic Signature(s) Signed: 11/27/2017 5:40:08 PM By: Baltazar Najjar MD Entered By: Baltazar Najjar on 11/27/2017 17:30:21 [name], [name] ([id number]) -------------------------------------------------------------------------------- Multi-Disciplinary Care Plan Details Patient Name: [name] Date of Service: 11/27/2017 3:15 PM Medical Record Number: [id number] Patient Account Number: [id number] Date of Birth/Sex:  11/14/1975 (42 y.o. F) Treating RN: Huel Coventry Primary Care Grenda Lora: Cheryll Dessert Other Clinician: Referring Tierria Watson: Cheryll Dessert Treating Itxel Wickard/Extender: Altamese [place] in Treatment: 5 Active Inactive ` Abuse / Safety / Falls / Self Care Management Nursing Diagnoses: Impaired physical mobility Goals: Patient will not experience any injury related to falls Date Initiated: 10/23/2017 Target Resolution Date: 01/11/2018 Goal Status: Active Interventions: Assess fall risk on admission and as needed Notes: ` Nutrition Nursing Diagnoses: Imbalanced nutrition Goals: Patient/caregiver agrees to and verbalizes understanding of need to use nutritional supplements and/or vitamins as prescribed Date Initiated: 10/23/2017 Target Resolution Date: 01/11/2018 Goal Status: Active Interventions: Assess patient nutrition upon admission and as needed per policy Notes: ` Orientation to the Wound Care Program Nursing Diagnoses: Knowledge deficit related to the wound healing center program Goals: Patient/caregiver will verbalize understanding of the Wound Healing Center Program Date Initiated: 10/23/2017 Target Resolution Date: 01/11/2018 Goal Status: Active Interventions: [name], [name] ([id number]) Provide education  on orientation to the wound center Notes: ` Wound/Skin Impairment Nursing Diagnoses: Impaired tissue integrity Goals: Ulcer/skin breakdown will heal within 14 weeks Date Initiated: 10/23/2017 Target Resolution Date: 01/11/2018 Goal Status: Active Interventions: Assess patient/caregiver ability to obtain necessary supplies Assess patient/caregiver ability to perform ulcer/skin care regimen upon admission and as needed Assess ulceration(s) every visit Notes: Electronic Signature(s) Signed: 11/27/2017 8:51:29 PM By: Elliot Gurney, BSN, RN, CWS, Kim RN, BSN Entered By: Elliot Gurney, BSN, RN, CWS, Kim on 11/27/2017 15:38:16 [name], [name]  ([id number]) -------------------------------------------------------------------------------- Pain Assessment Details Patient Name: [name] Date of Service: 11/27/2017 3:15 PM Medical Record Number: [id number] Patient Account Number: [id number] Date of Birth/Sex: [birth year] (42 y.o. F) Treating RN: Curtis Sites Primary Care Darby Shadwick: Cheryll Dessert Other Clinician: Referring Travious Vanover: Cheryll Dessert Treating Damaris Geers/Extender: Altamese [place] in Treatment: 5 Active Problems Location of Pain Severity and Description of Pain Patient Has Paino No Site Locations Pain Management and Medication Current Pain Management: Electronic Signature(s) Signed: 11/27/2017 5:00:43 PM By: Curtis Sites Entered By: Curtis Sites on 11/27/2017 15:21:59 [name], [name] ([id number]) -------------------------------------------------------------------------------- Patient/Caregiver Education Details Patient Name: [name] Date of Service: 11/27/2017 3:15 PM Medical Record Number: [id number] Patient Account Number: [id number] Date of Birth/Gender: 1976-05-22 (42 y.o. F) Treating RN: Curtis Sites Primary Care Physician: Cheryll Dessert Other Clinician: Referring Physician: Cheryll Dessert Treating Physician/Extender: Altamese [employer] in Treatment: 5 Education Assessment Education Provided To: Patient and Caregiver Education Topics Provided Wound/Skin Impairment: Handouts: Other: wound care as ordered Methods: Demonstration, Explain/Verbal Responses: State content correctly Electronic Signature(s) Signed: 11/27/2017 5:00:43 PM By: Curtis Sites Entered By: Curtis Sites on 11/27/2017 16:58:54 [name], [name] ([id number]) -------------------------------------------------------------------------------- Wound Assessment Details Patient Name: [name] Date of Service: 11/27/2017 3:15 PM Medical Record Number: [id number] Patient Account Number:  [id number] Date of Birth/Sex: 14-Feb-1976 (42 y.o. F) Treating RN: Curtis Sites Primary Care Trenity Pha: Cheryll Dessert Other Clinician: Referring Duston Smolenski: Cheryll Dessert Treating Natesha Hassey/Extender: Altamese [place] in Treatment: 5 Wound Status Wound Number: 2 Primary Abscess Etiology: Wound Location: Right Abdomen - midline Wound Status: Open Wounding Event: Bump Comorbid Lymphedema, Hypertension, Type II Date Acquired: 10/07/2017 History: Diabetes, Neuropathy Weeks Of Treatment: 5 Clustered Wound: No Photos Photo Uploaded By: Curtis Sites on 11/27/2017 15:39:47 Wound Measurements Length: (cm) 0.6 Width: (cm) 0.7 Depth: (cm) 0.1 Area: (cm) 0.33 Volume: (cm) 0.033 % Reduction in Area: 41.6% % Reduction in Volume: 92.7% Epithelialization: None Tunneling: No Undermining: Yes Starting Position (o'clock): 12 Ending Position (o'clock): 4 Maximum Distance: (cm) 0.3 Wound Description Full Thickness Without Exposed Support Classification: Structures Wound Margin: Flat and Intact Exudate Large Amount: Exudate Type: Serous Exudate Color: amber Foul Odor After Cleansing: No Slough/Fibrino Yes Wound Bed Granulation Amount: Large (67-100%) Exposed Structure Granulation Quality: Red Fascia Exposed: No Necrotic Amount: None Present (0%) Fat Layer (Subcutaneous Tissue) Exposed: Yes Tendon Exposed: No Muscle Exposed: No Joint Exposed: No [name], [name] ([id number]) Bone Exposed: No Periwound Skin Texture Texture Color No Abnormalities Noted: No No Abnormalities Noted: No Callus: No Atrophie Blanche: No Crepitus: No Cyanosis: No Excoriation: No Ecchymosis: Yes Induration: Yes Erythema: No Rash: No Hemosiderin Staining: No Scarring: No Mottled: No Pallor: No Moisture Rubor: No No Abnormalities Noted: No Dry / Scaly: No Temperature / Pain Maceration: No Temperature: No Abnormality Tenderness on Palpation: Yes Wound Preparation Ulcer  Cleansing: Rinsed/Irrigated with Saline Topical Anesthetic Applied: Other: lidocaine 4%, Treatment Notes Wound #2 (Right Abdomen - midline) 1. Cleansed with: Clean wound with Normal Saline 2. Anesthetic Topical  Lidocaine 4% cream to wound bed prior to debridement 4. Dressing Applied: Other dressing (specify in notes) 5. Secondary Dressing Applied Bordered Foam Dressing Notes endoform Electronic Signature(s) Signed: 11/27/2017 5:00:43 PM By: Curtis Sites Entered By: Curtis Sites on 11/27/2017 15:28:03 [name], [name] ([id number]) -------------------------------------------------------------------------------- Wound Assessment Details Patient Name: [name] Date of Service: 11/27/2017 3:15 PM Medical Record Number: [id number] Patient Account Number: [id number] Date of Birth/Sex: [birth year] (42 y.o. F) Treating RN: Curtis Sites Primary Care Jaleen Finch: Cheryll Dessert Other Clinician: Referring Mimie Goering: Cheryll Dessert Treating Mayme Profeta/Extender: Altamese [place] in Treatment: 5 Wound Status Wound Number: 3 Primary Atypical Etiology: Wound Location: Left Abdomen - midline Wound Status: Open Wounding Event: Not Known Comorbid Lymphedema, Hypertension, Type II Date Acquired: 08/12/2017 History: Diabetes, Neuropathy Weeks Of Treatment: 5 Clustered Wound: No Photos Photo Uploaded By: Curtis Sites on 11/27/2017 15:39:47 Wound Measurements Length: (cm) 0.4 Width: (cm) 0.4 Depth: (cm) 0.1 Area: (cm) 0.126 Volume: (cm) 0.013 % Reduction in Area: 66.6% % Reduction in Volume: 65.8% Epithelialization: None Tunneling: No Undermining: No Wound Description Full Thickness Without Exposed Support Classification: Structures Wound Margin: Flat and Intact Exudate None Present Amount: Foul Odor After Cleansing: No Slough/Fibrino Yes Wound Bed Granulation Amount: Medium (34-66%) Exposed Structure Granulation Quality: Red Fascia Exposed: No Necrotic  Amount: Medium (34-66%) Fat Layer (Subcutaneous Tissue) Exposed: Yes Necrotic Quality: Adherent Slough Tendon Exposed: No Muscle Exposed: No Joint Exposed: No Bone Exposed: No Periwound Skin Texture Texture Color No Abnormalities Noted: No No Abnormalities Noted: No Callus: No Atrophie Blanche: No [name], [name] ([id number]) Crepitus: No Cyanosis: No Excoriation: No Ecchymosis: No Induration: No Erythema: No Rash: No Hemosiderin Staining: No Scarring: No Mottled: No Pallor: No Moisture Rubor: No No Abnormalities Noted: No Dry / Scaly: No Temperature / Pain Maceration: No Temperature: No Abnormality Tenderness on Palpation: Yes Wound Preparation Ulcer Cleansing: Rinsed/Irrigated with Saline Topical Anesthetic Applied: Other: lidocaine 4%, Treatment Notes Wound #3 (Left Abdomen - midline) 1. Cleansed with: Clean wound with Normal Saline 2. Anesthetic Topical Lidocaine 4% cream to wound bed prior to debridement 4. Dressing Applied: Other dressing (specify in notes) 5. Secondary Dressing Applied Bordered Foam Dressing Notes endoform Electronic Signature(s) Signed: 11/27/2017 5:00:43 PM By: Curtis Sites Entered By: Curtis Sites on 11/27/2017 15:29:57 [name], [name] ([id number]) -------------------------------------------------------------------------------- Vitals Details Patient Name: [name] Date of Service: 11/27/2017 3:15 PM Medical Record Number: [id number] Patient Account Number: [id number] Date of Birth/Sex: Dec 03, 1975 (42 y.o. F) Treating RN: Curtis Sites Primary Care Johnnay Pleitez: Cheryll Dessert Other Clinician: Referring Coltyn Hanning: Cheryll Dessert Treating Niasha Devins/Extender: Altamese [place] in Treatment: 5 Vital Signs Time Taken: 15:22 Temperature (F): 98.2 Height (in): 66 Pulse (bpm): 105 Weight (lbs): 385 Respiratory Rate (breaths/min): 18 Body Mass Index (BMI): 62.1 Blood Pressure (mmHg): 147/78 Reference Range: 80 -  120 mg / dl Electronic Signature(s) Signed: 11/27/2017 5:00:43 PM By: Curtis Sites Entered By: Curtis Sites on 11/27/2017 15:23:51

## 2017-12-01 NOTE — Progress Notes (Signed)
ROMEKA, SCIFRES ([id number]) Visit Report for 11/27/2017 Debridement Details Patient Name: [name], Hett Date of Service: 11/27/2017 3:15 PM Medical Record Number: [id number] Patient Account Number: [id number] Date of Birth/Sex: Aug 09, 1975 (42 y.o. F) Treating RN: Huel Coventry Primary Care Provider: Cheryll Dessert Other Clinician: Referring Provider: Cheryll Dessert Treating Provider/Extender: Altamese [place] in Treatment: 5 Debridement Performed for Wound #3 Left Abdomen - midline Assessment: Performed By: Physician Maxwell Caul, MD Debridement Type: Debridement Pre-procedure Verification/Time Yes - 15:41 Out Taken: Start Time: 15:41 Pain Control: Other : lidocaine 4% Total Area Debrided (L x W): 0.4 (cm) x 0.4 (cm) = 0.16 (cm) Tissue and other material Viable, Slough, Subcutaneous, Slough debrided: Level: Skin/Subcutaneous Tissue Debridement Description: Excisional Instrument: Curette Bleeding: None End Time: 15:42 Procedural Pain: 0 Post Procedural Pain: 0 Response to Treatment: Procedure was tolerated well Post Debridement Measurements of Total Wound Length: (cm) 0.4 Width: (cm) 0.4 Depth: (cm) 0.2 Volume: (cm) 0.025 Character of Wound/Ulcer Post Debridement: Stable Post Procedure Diagnosis Same as Pre-procedure Electronic Signature(s) Signed: 11/27/2017 5:40:08 PM By: Baltazar Najjar MD Signed: 11/27/2017 8:51:29 PM By: Elliot Gurney, BSN, RN, CWS, Kim RN, BSN Entered By: Baltazar Najjar on 11/27/2017 17:30:37 [name], Florentina Addison ([id number]) -------------------------------------------------------------------------------- HPI Details Patient Name: [name] Date of Service: 11/27/2017 3:15 PM Medical Record Number: [id number] Patient Account Number: [id number] Date of Birth/Sex: [birth year] (42 y.o. F) Treating RN: Huel Coventry Primary Care Provider: Cheryll Dessert Other Clinician: Referring Provider: Cheryll Dessert Treating Provider/Extender:  Altamese [place] in Treatment: 5 History of Present Illness HPI Description: 10/23/17-she is here for initial evaluation of 2 abdominal wounds. She is a type I diabetic with an insulin pump, she developed an abscess to the right side of her abdomen at an insertion sites. She underwent incision and drainage on March 5 and was packed with iodoform packing. The packing was maintained until her follow-up appointment on March 13 at which time it was replaced. She has not been instructed to change packing daily and packing has been maintained. She continues to have some induration and pain although significantly improved. Originally after incision and drainage she was placed on Augmentin and Bactrim which she completed Saturday 3/16. she underwent a biopsy of a mole to her left lateral abdomen and is being followed by dermatology with plans for wide excision of "abnormal cells" in June. She is on chronic suppressive antibiotic therapy (doxycycline) for hidradenitis. Her last A1c was 7.2 in November and states her daily glucose levels are normally in the low 100s. 10/30/17; patient admitted to our clinic last week. She is a type I diabetic. She had an abscess develop on the right side of her lower abdomen at the site of an insulin pump. This underwent an IandD on March 5. She also underwent an unrelated biopsy of a mole on her lateral abdomen by dermatology. This had "abnormal cells" however I have not actually seen this report. She is due for a wider excision soon 11/05/17; patient has 2 wounds on her abdomen one was an abscess site on the right the other a surgical biopsy site. She has been using silver collagen but not making much progress the area on the right still has considerable undermining although the actual tissue looks healthy. The area on the left necrotic surface debris. Changed her to Endoform today 11/13/17; 2 wounds on her abdomen. On one on the right side was an abscess site and the  other on the left a surgical biopsy site. It's a  surgical biopsy site that seems to be more difficult. Using Endoform the area on the right which was an abscess IandD looks a lot better. Less undermining healthier looking tissue still requires debridement 11/20/17; 2 wounds on her abdomen. On the right side was an abscess site and on the left flank a surgical biopsy site. Both wounds look better. We've been using Endoform. Electronic Signature(s) Signed: 11/27/2017 5:40:08 PM By: Baltazar Najjar MD Entered By: Baltazar Najjar on 11/27/2017 17:31:24 [name], Florentina Addison ([id number]) -------------------------------------------------------------------------------- Physical Exam Details Patient Name: [name] Date of Service: 11/27/2017 3:15 PM Medical Record Number: [id number] Patient Account Number: [id number] Date of Birth/Sex: 19-Jul-1976 (42 y.o. F) Treating RN: Huel Coventry Primary Care Provider: Cheryll Dessert Other Clinician: Referring Provider: Cheryll Dessert Treating Provider/Extender: Altamese [place] in Treatment: 5 Notes wound exam o1 open area remains on the right abdomen. This still has some undermining medially although the surface of this looks a lot better. The undermining is coming in nicely oOn the left abdomen the surface of the wound looks better but still requires debridement with a #3 Curettte. This is been problematic. Electronic Signature(s) Signed: 11/27/2017 5:40:08 PM By: Baltazar Najjar MD Entered By: Baltazar Najjar on 11/27/2017 17:32:58 [name], Florentina Addison ([id number]) -------------------------------------------------------------------------------- Physician Orders Details Patient Name: [name] Date of Service: 11/27/2017 3:15 PM Medical Record Number: [id number] Patient Account Number: [id number] Date of Birth/Sex: April 02, 1976 (42 y.o. F) Treating RN: Huel Coventry Primary Care Provider: Cheryll Dessert Other Clinician: Referring Provider:  Cheryll Dessert Treating Provider/Extender: Altamese [place] in Treatment: 5 Verbal / Phone Orders: No Diagnosis Coding Wound Cleansing Wound #2 Right Abdomen - midline o Clean wound with Normal Saline. o May Shower, gently pat wound dry prior to applying new dressing. Wound #3 Left Abdomen - midline o Clean wound with Normal Saline. o May Shower, gently pat wound dry prior to applying new dressing. Anesthetic (add to Medication List) Wound #2 Right Abdomen - midline o Topical Lidocaine 4% cream applied to wound bed prior to debridement (In Clinic Only). Wound #3 Left Abdomen - midline o Topical Lidocaine 4% cream applied to wound bed prior to debridement (In Clinic Only). Skin Barriers/Peri-Wound Care Wound #2 Right Abdomen - midline o Other: - lotrimin cream to reddened areas Wound #3 Left Abdomen - midline o Other: - lotrimin cream to reddened areas Primary Wound Dressing Wound #2 Right Abdomen - midline o Other: - Endoform all wounds Wound #3 Left Abdomen - midline o Other: - Endoform all wounds Secondary Dressing Wound #2 Right Abdomen - midline o Boardered Foam Dressing - all wounds Wound #3 Left Abdomen - midline o ABD pad o Boardered Foam Dressing - all wounds Dressing Change Frequency Wound #2 Right Abdomen - midline o Change dressing every other day. Wound #3 Left Abdomen - midline [name], Yesena ([id number]) o Change dressing every other day. Follow-up Appointments Wound #2 Right Abdomen - midline o Return Appointment in 2 weeks. Wound #3 Left Abdomen - midline o Return Appointment in 2 weeks. Additional Orders / Instructions Wound #2 Right Abdomen - midline o Increase protein intake. o Activity as tolerated Wound #3 Left Abdomen - midline o Increase protein intake. o Activity as tolerated Electronic Signature(s) Signed: 11/27/2017 5:40:08 PM By: Baltazar Najjar MD Signed: 11/27/2017 8:51:29 PM By: Elliot Gurney,  BSN, RN, CWS, Kim RN, BSN Entered By: Elliot Gurney, BSN, RN, CWS, Kim on 11/27/2017 15:45:43 [name], Florentina Addison ([id number]) -------------------------------------------------------------------------------- Problem List Details Patient Name: [name], Nalla Date of  Service: 11/27/2017 3:15 PM Medical Record Number: [id number] Patient Account Number: [id number] Date of Birth/Sex: [birth year] (42 y.o. F) Treating RN: Huel Coventry Primary Care Provider: Cheryll Dessert Other Clinician: Referring Provider: Cheryll Dessert Treating Provider/Extender: Altamese [place] in Treatment: 5 Active Problems ICD-10 Impacting Encounter Code Description Active Date Wound Healing Diagnosis L02.211 Cutaneous abscess of abdominal wall 10/23/2017 Yes S31.109S Unspecified open wound of abdominal wall, unspecified 10/23/2017 Yes quadrant without penetration into peritoneal cavity, sequela E11.622 Type 2 diabetes mellitus with other skin ulcer 10/23/2017 Yes E66.01 Morbid (severe) obesity due to excess calories 10/23/2017 Yes Inactive Problems Resolved Problems Electronic Signature(s) Signed: 11/27/2017 5:40:08 PM By: Baltazar Najjar MD Entered By: Baltazar Najjar on 11/27/2017 17:29:52 [name], Florentina Addison ([id number]) -------------------------------------------------------------------------------- Progress Note Details Patient Name: [name] Date of Service: 11/27/2017 3:15 PM Medical Record Number: [id number] Patient Account Number: [id number] Date of Birth/Sex: Nov 14, 1975 (42 y.o. F) Treating RN: Huel Coventry Primary Care Provider: Cheryll Dessert Other Clinician: Referring Provider: Cheryll Dessert Treating Provider/Extender: Altamese [place] in Treatment: 5 Subjective History of Present Illness (HPI) 10/23/17-she is here for initial evaluation of 2 abdominal wounds. She is a type I diabetic with an insulin pump, she developed an abscess to the right side of her abdomen at an insertion sites. She  underwent incision and drainage on March 5 and was packed with iodoform packing. The packing was maintained until her follow-up appointment on March 13 at which time it was replaced. She has not been instructed to change packing daily and packing has been maintained. She continues to have some induration and pain although significantly improved. Originally after incision and drainage she was placed on Augmentin and Bactrim which she completed Saturday 3/16. she underwent a biopsy of a mole to her left lateral abdomen and is being followed by dermatology with plans for wide excision of "abnormal cells" in June. She is on chronic suppressive antibiotic therapy (doxycycline) for hidradenitis. Her last A1c was 7.2 in November and states her daily glucose levels are normally in the low 100s. 10/30/17; patient admitted to our clinic last week. She is a type I diabetic. She had an abscess develop on the right side of her lower abdomen at the site of an insulin pump. This underwent an IandD on March 5. She also underwent an unrelated biopsy of a mole on her lateral abdomen by dermatology. This had "abnormal cells" however I have not actually seen this report. She is due for a wider excision soon 11/05/17; patient has 2 wounds on her abdomen one was an abscess site on the right the other a surgical biopsy site. She has been using silver collagen but not making much progress the area on the right still has considerable undermining although the actual tissue looks healthy. The area on the left necrotic surface debris. Changed her to Endoform today 11/13/17; 2 wounds on her abdomen. On one on the right side was an abscess site and the other on the left a surgical biopsy site. It's a surgical biopsy site that seems to be more difficult. Using Endoform the area on the right which was an abscess IandD looks a lot better. Less undermining healthier looking tissue still requires debridement 11/20/17; 2 wounds on her  abdomen. On the right side was an abscess site and on the left flank a surgical biopsy site. Both wounds look better. We've been using Endoform. Objective Constitutional Vitals Time Taken: 3:22 PM, Height: 66 in, Weight: 385 lbs, BMI: 62.1, Temperature: 98.2 F,  Pulse: 105 bpm, Respiratory Rate: 18 breaths/min, Blood Pressure: 147/78 mmHg. Integumentary (Hair, Skin) Wound #2 status is Open. Original cause of wound was Bump. The wound is located on the Right Abdomen - midline. The wound measures 0.6cm length x 0.7cm width x 0.1cm depth; 0.33cm^2 area and 0.033cm^3 volume. There is Fat Layer (Subcutaneous Tissue) Exposed exposed. There is no tunneling noted, however, there is undermining starting at 12:00 and ending at 4:00 with a maximum distance of 0.3cm. There is a large amount of serous drainage noted. The wound margin is flat and intact. There is large (67-100%) red granulation within the wound bed. There is no necrotic tissue within the wound bed. The periwound skin appearance exhibited: Induration, Ecchymosis. The periwound skin appearance did not exhibit: Callus, Crepitus, Excoriation, Rash, Scarring, Dry/Scaly, Maceration, Atrophie Blanche, Cyanosis, Hemosiderin Staining, [name], Magdalen ([id number]) Mottled, Pallor, Rubor, Erythema. Periwound temperature was noted as No Abnormality. The periwound has tenderness on palpation. Wound #3 status is Open. Original cause of wound was Not Known. The wound is located on the Left Abdomen - midline. The wound measures 0.4cm length x 0.4cm width x 0.1cm depth; 0.126cm^2 area and 0.013cm^3 volume. There is Fat Layer (Subcutaneous Tissue) Exposed exposed. There is no tunneling or undermining noted. There is a none present amount of drainage noted. The wound margin is flat and intact. There is medium (34-66%) red granulation within the wound bed. There is a medium (34-66%) amount of necrotic tissue within the wound bed including Adherent Slough. The  periwound skin appearance did not exhibit: Callus, Crepitus, Excoriation, Induration, Rash, Scarring, Dry/Scaly, Maceration, Atrophie Blanche, Cyanosis, Ecchymosis, Hemosiderin Staining, Mottled, Pallor, Rubor, Erythema. Periwound temperature was noted as No Abnormality. The periwound has tenderness on palpation. Assessment Active Problems ICD-10 L02.211 - Cutaneous abscess of abdominal wall S31.109S - Unspecified open wound of abdominal wall, unspecified quadrant without penetration into peritoneal cavity, sequela E11.622 - Type 2 diabetes mellitus with other skin ulcer E66.01 - Morbid (severe) obesity due to excess calories Procedures Wound #3 Pre-procedure diagnosis of Wound #3 is an Atypical located on the Left Abdomen - midline . There was a Excisional Skin/Subcutaneous Tissue Debridement with a total area of 0.16 sq cm performed by Maxwell Caul, MD. With the following instrument(s): Curette. to remove Viable tissue/material Material removed includes Subcutaneous Tissue, and Slough, and [place] after achieving pain control using Other (lidocaine 4%). No specimens were taken. A time out was conducted at 15:41, prior to the start of the procedure. There was no bleeding. The procedure was tolerated well with a pain level of 0 throughout and a pain level of 0 following the procedure. Post Debridement Measurements: 0.4cm length x 0.4cm width x 0.2cm depth; 0.025cm^3 volume. Character of Wound/Ulcer Post Debridement is stable. Post procedure Diagnosis Wound #3: Same as Pre-Procedure Plan Wound Cleansing: Wound #2 Right Abdomen - midline: Clean wound with Normal Saline. May Shower, gently pat wound dry prior to applying new dressing. Wound #3 Left Abdomen - midline: Clean wound with Normal Saline. May Shower, gently pat wound dry prior to applying new dressing. [name], Janyth ([id number]) Anesthetic (add to Medication List): Wound #2 Right Abdomen - midline: Topical Lidocaine 4%  cream applied to wound bed prior to debridement (In Clinic Only). Wound #3 Left Abdomen - midline: Topical Lidocaine 4% cream applied to wound bed prior to debridement (In Clinic Only). Skin Barriers/Peri-Wound Care: Wound #2 Right Abdomen - midline: Other: - lotrimin cream to reddened areas Wound #3 Left Abdomen - midline: Other: -  lotrimin cream to reddened areas Primary Wound Dressing: Wound #2 Right Abdomen - midline: Other: - Endoform all wounds Wound #3 Left Abdomen - midline: Other: - Endoform all wounds Secondary Dressing: Wound #2 Right Abdomen - midline: Boardered Foam Dressing - all wounds Wound #3 Left Abdomen - midline: ABD pad Boardered Foam Dressing - all wounds Dressing Change Frequency: Wound #2 Right Abdomen - midline: Change dressing every other day. Wound #3 Left Abdomen - midline: Change dressing every other day. Follow-up Appointments: Wound #2 Right Abdomen - midline: Return Appointment in 2 weeks. Wound #3 Left Abdomen - midline: Return Appointment in 2 weeks. Additional Orders / Instructions: Wound #2 Right Abdomen - midline: Increase protein intake. Activity as tolerated Wound #3 Left Abdomen - midline: Increase protein intake. Activity as tolerated #1Endoform [place] wound areas #2 surface of the left wound which was a biopsy site may require something else. #3 I've never had any information about what the actual pathology was on this wound area. She has apparently been planned for further minor surgery in this area sometime in June. We'll have our staff look into seeing if we can get records on this Electronic Signature(s) Signed: 11/27/2017 5:40:08 PM By: Baltazar Najjar MD Entered By: Baltazar Najjar on 11/27/2017 17:34:20 [name], Florentina Addison ([id number]) -------------------------------------------------------------------------------- SuperBill Details Patient Name: [name] Date of Service: 11/27/2017 Medical Record Number:  [id number] Patient Account Number: [id number] Date of Birth/Sex: 1976-04-05 (42 y.o. F) Treating RN: Huel Coventry Primary Care Provider: Cheryll Dessert Other Clinician: Referring Provider: Cheryll Dessert Treating Provider/Extender: Altamese [place] in Treatment: 5 Diagnosis Coding ICD-10 Codes Code Description L02.211 Cutaneous abscess of abdominal wall Unspecified open wound of abdominal wall, unspecified quadrant without penetration into peritoneal S31.109S cavity, sequela E11.622 Type 2 diabetes mellitus with other skin ulcer E66.01 Morbid (severe) obesity due to excess calories Facility Procedures CPT4: Description Modifier Quantity Code [id number] 11042 - DEB SUBQ TISSUE 20 SQ CM/< 1 ICD-10 Diagnosis Description S31.109S Unspecified open wound of abdominal wall, unspecified quadrant without penetration into peritoneal cavity, sequela Physician Procedures CPT4: Description Modifier Quantity Code [id number] 11042 - WC PHYS SUBQ TISS 20 SQ CM 1 ICD-10 Diagnosis Description S31.109S Unspecified open wound of abdominal wall, unspecified quadrant without penetration into peritoneal cavity, sequela Electronic Signature(s) Signed: 11/27/2017 5:40:08 PM By: Baltazar Najjar MD Entered By: Baltazar Najjar on 11/27/2017 17:34:57

## 2017-12-11 ENCOUNTER — Encounter: Payer: Medicaid Other | Attending: Internal Medicine | Admitting: Internal Medicine

## 2017-12-11 DIAGNOSIS — Z6841 Body Mass Index (BMI) 40.0 and over, adult: Secondary | ICD-10-CM | POA: Insufficient documentation

## 2017-12-11 DIAGNOSIS — L02211 Cutaneous abscess of abdominal wall: Secondary | ICD-10-CM | POA: Diagnosis not present

## 2017-12-11 DIAGNOSIS — Z794 Long term (current) use of insulin: Secondary | ICD-10-CM | POA: Diagnosis not present

## 2017-12-11 DIAGNOSIS — E11622 Type 2 diabetes mellitus with other skin ulcer: Secondary | ICD-10-CM | POA: Insufficient documentation

## 2017-12-14 NOTE — Progress Notes (Signed)
EVEE, LISKA ([id number]) Visit Report for 12/11/2017 HPI Details Patient Name: [name], [name] Date of Service: 12/11/2017 3:30 PM Medical Record Number: [id number] Patient Account Number: [id number] Date of Birth/Sex: [birth year] (42 y.o. F) Treating RN: Huel Coventry Primary Care Provider: Cheryll Dessert Other Clinician: Referring Provider: Cheryll Dessert Treating Provider/Extender: Altamese [place] in Treatment: 7 History of Present Illness HPI Description: 10/23/17-she is here for initial evaluation of 2 abdominal wounds. She is a type I diabetic with an insulin pump, she developed an abscess to the right side of her abdomen at an insertion sites. She underwent incision and drainage on March 5 and was packed with iodoform packing. The packing was maintained until her follow-up appointment on March 13 at which time it was replaced. She has not been instructed to change packing daily and packing has been maintained. She continues to have some induration and pain although significantly improved. Originally after incision and drainage she was placed on Augmentin and Bactrim which she completed Saturday 3/16. she underwent a biopsy of a mole to her left lateral abdomen and is being followed by dermatology with plans for wide excision of "abnormal cells" in June. She is on chronic suppressive antibiotic therapy (doxycycline) for hidradenitis. Her last A1c was 7.2 in November and states her daily glucose levels are normally in the low 100s. 10/30/17; patient admitted to our clinic last week. She is a type I diabetic. She had an abscess develop on the right side of her lower abdomen at the site of an insulin pump. This underwent an IandD on March 5. She also underwent an unrelated biopsy of a mole on her lateral abdomen by dermatology. This had "abnormal cells" however I have not actually seen this report. She is due for a wider excision soon 11/05/17; patient has 2 wounds on her abdomen one  was an abscess site on the right the other a surgical biopsy site. She has been using silver collagen but not making much progress the area on the right still has considerable undermining although the actual tissue looks healthy. The area on the left necrotic surface debris. Changed her to Endoform today 11/13/17; 2 wounds on her abdomen. On one on the right side was an abscess site and the other on the left a surgical biopsy site. It's a surgical biopsy site that seems to be more difficult. Using Endoform the area on the right which was an abscess IandD looks a lot better. Less undermining healthier looking tissue still requires debridement 11/20/17; 2 wounds on her abdomen. On the right side was an abscess site and on the left flank a surgical biopsy site. Both wounds look better. We've been using Endoform. 12/11/17; been 3 weeks since I saw this lady. On the right side of her abdomen was an abscess site that underwent an IandD and on the left flank a surgical biopsy site. The area on the left flank is fully epithelialized. Skin slightly discolored. Per the patient's description she was told that the biopsy of a mole in this area was atypical and she is apparently going to have further skin surgery in June although she doesn't have a date. In any case at this point this area is closed The abscess site on the right lower abdomen had undermining although this is completely filled in with normal granulation there is no undermining and no evidence of surrounding infection Electronic Signature(s) Signed: 12/12/2017 1:16:01 PM By: Baltazar Najjar MD Entered By: Baltazar Najjar on 12/11/2017 17:12:26 [name],  [name] ([id number]) -------------------------------------------------------------------------------- Physical Exam Details Patient Name: Brooke ROSEGaelyn, [name] Date of Service: 12/11/2017 3:30 PM Medical Record Number: [id number] Patient Account Number: [id number] Date of Birth/Sex: 01/20/1976 (42 y.o.  F) Treating RN: Huel Coventry Primary Care Provider: Cheryll Dessert Other Clinician: Referring Provider: Cheryll Dessert Treating Provider/Extender: Altamese [place] in Treatment: 7 Constitutional Patient is hypertensive.. Pulse regular and within target range for patient.Marland Kitchen Respirations regular, non-labored and within target range.Marland Kitchen appears in no distress. Notes wound exam oOn the right mid abdomen the undermining is completely filled in with normal looking granulation tissue. There is the beginnings of epithelialization oThe area on the left abdomen is fully epithelialized at this point. The underlying skin is discolored. She is to follow-up with dermatology about this. He is apparently going to have this area undergo further excision in June. I have never seen the dermatology reports Electronic Signature(s) Signed: 12/12/2017 1:16:01 PM By: Baltazar Najjar MD Entered By: Baltazar Najjar on 12/11/2017 17:13:47 [name], [name] ([id number]) -------------------------------------------------------------------------------- Physician Orders Details Patient Name: [name] Date of Service: 12/11/2017 3:30 PM Medical Record Number: [id number] Patient Account Number: [id number] Date of Birth/Sex: 07/01/1976 (42 y.o. F) Treating RN: Huel Coventry Primary Care Provider: Cheryll Dessert Other Clinician: Referring Provider: Cheryll Dessert Treating Provider/Extender: Altamese [place] in Treatment: 7 Verbal / Phone Orders: No Diagnosis Coding Wound Cleansing Wound #2 Right Abdomen - midline o Clean wound with Normal Saline. o May Shower, gently pat wound dry prior to applying new dressing. Anesthetic (add to Medication List) Wound #2 Right Abdomen - midline o Topical Lidocaine 4% cream applied to wound bed prior to debridement (In Clinic Only). Skin Barriers/Peri-Wound Care Wound #2 Right Abdomen - midline o Other: - lotrimin cream to reddened areas Primary Wound  Dressing Wound #2 Right Abdomen - midline o Other: - Endoform all wounds Secondary Dressing Wound #2 Right Abdomen - midline o Boardered Foam Dressing - all wounds Dressing Change Frequency Wound #2 Right Abdomen - midline o Change dressing every other day. Follow-up Appointments Wound #2 Right Abdomen - midline o Return Appointment in 2 weeks. Additional Orders / Instructions Wound #2 Right Abdomen - midline o Increase protein intake. o Activity as tolerated Electronic Signature(s) Signed: 12/11/2017 5:47:31 PM By: Elliot Gurney, BSN, RN, CWS, Kim RN, BSN Signed: 12/12/2017 1:16:01 PM By: Baltazar Najjar MD Entered By: Elliot Gurney, BSN, RN, CWS, Kim on 12/11/2017 16:02:17 [name], [name] ([id number]) [name], [name] ([id number]) -------------------------------------------------------------------------------- Problem List Details Patient Name: [name] Date of Service: 12/11/2017 3:30 PM Medical Record Number: [id number] Patient Account Number: [id number] Date of Birth/Sex: 1975-12-25 (42 y.o. F) Treating RN: Huel Coventry Primary Care Provider: Cheryll Dessert Other Clinician: Referring Provider: Cheryll Dessert Treating Provider/Extender: Altamese [place] in Treatment: 7 Active Problems ICD-10 Impacting Encounter Code Description Active Date Wound Healing Diagnosis L02.211 Cutaneous abscess of abdominal wall 10/23/2017 Yes S31.109S Unspecified open wound of abdominal wall, unspecified 10/23/2017 Yes quadrant without penetration into peritoneal cavity, sequela E11.622 Type 2 diabetes mellitus with other skin ulcer 10/23/2017 Yes E66.01 Morbid (severe) obesity due to excess calories 10/23/2017 Yes Inactive Problems Resolved Problems Electronic Signature(s) Signed: 12/12/2017 1:16:01 PM By: Baltazar Najjar MD Entered By: Baltazar Najjar on 12/11/2017 17:10:33 [name], [name]  ([id number]) -------------------------------------------------------------------------------- Progress Note Details Patient Name: [name] Date of Service: 12/11/2017 3:30 PM Medical Record Number: [id number] Patient Account Number: [id number] Date of Birth/Sex: [birth year] (42 y.o. F) Treating RN: Huel Coventry Primary Care Provider: Cheryll Dessert Other Clinician: Referring Provider:  Cheryll Dessert Treating Provider/Extender: Maxwell [place] in Treatment: 7 Subjective History of Present Illness (HPI) 10/23/17-she is here for initial evaluation of 2 abdominal wounds. She is a type I diabetic with an insulin pump, she developed an abscess to the right side of her abdomen at an insertion sites. She underwent incision and drainage on March 5 and was packed with iodoform packing. The packing was maintained until her follow-up appointment on March 13 at which time it was replaced. She has not been instructed to change packing daily and packing has been maintained. She continues to have some induration and pain although significantly improved. Originally after incision and drainage she was placed on Augmentin and Bactrim which she completed Saturday 3/16. she underwent a biopsy of a mole to her left lateral abdomen and is being followed by dermatology with plans for wide excision of "abnormal cells" in June. She is on chronic suppressive antibiotic therapy (doxycycline) for hidradenitis. Her last A1c was 7.2 in November and states her daily glucose levels are normally in the low 100s. 10/30/17; patient admitted to our clinic last week. She is a type I diabetic. She had an abscess develop on the right side of her lower abdomen at the site of an insulin pump. This underwent an IandD on March 5. She also underwent an unrelated biopsy of a mole on her lateral abdomen by dermatology. This had "abnormal cells" however I have not actually seen this report. She is due for a wider excision  soon 11/05/17; patient has 2 wounds on her abdomen one was an abscess site on the right the other a surgical biopsy site. She has been using silver collagen but not making much progress the area on the right still has considerable undermining although the actual tissue looks healthy. The area on the left necrotic surface debris. Changed her to Endoform today 11/13/17; 2 wounds on her abdomen. On one on the right side was an abscess site and the other on the left a surgical biopsy site. It's a surgical biopsy site that seems to be more difficult. Using Endoform the area on the right which was an abscess IandD looks a lot better. Less undermining healthier looking tissue still requires debridement 11/20/17; 2 wounds on her abdomen. On the right side was an abscess site and on the left flank a surgical biopsy site. Both wounds look better. We've been using Endoform. 12/11/17; been 3 weeks since I saw this lady. On the right side of her abdomen was an abscess site that underwent an IandD and on the left flank a surgical biopsy site. The area on the left flank is fully epithelialized. Skin slightly discolored. Per the patient's description she was told that the biopsy of a mole in this area was atypical and she is apparently going to have further skin surgery in June although she doesn't have a date. In any case at this point this area is closed The abscess site on the right lower abdomen had undermining although this is completely filled in with normal granulation there is no undermining and no evidence of surrounding infection Objective Constitutional Patient is hypertensive.. Pulse regular and within target range for patient.Marland Kitchen Respirations regular, non-labored and within target range.Marland Kitchen appears in no distress. Vitals Time Taken: 3:34 PM, Height: 66 in, Weight: 385 lbs, BMI: 62.1, Temperature: 98.0 F, Pulse: 97 bpm, Respiratory Rate: 18 breaths/min, Blood Pressure: 148/71 mmHg. [name], [name]  ([id number]) General Notes: wound exam On the right mid abdomen the undermining is  completely filled in with normal looking granulation tissue. There is the beginnings of epithelialization The area on the left abdomen is fully epithelialized at this point. The underlying skin is discolored. She is to follow-up with dermatology about this. He is apparently going to have this area undergo further excision in June. I have never seen the dermatology reports Integumentary (Hair, Skin) Wound #2 status is Open. Original cause of wound was Bump. The wound is located on the Right Abdomen - midline. The wound measures 0.5cm length x 0.5cm width x 0.1cm depth; 0.196cm^2 area and 0.02cm^3 volume. There is Fat Layer (Subcutaneous Tissue) Exposed exposed. There is no tunneling or undermining noted. There is a large amount of sanguinous drainage noted. The wound margin is flat and intact. There is large (67-100%) red granulation within the wound bed. There is no necrotic tissue within the wound bed. The periwound skin appearance exhibited: Induration, Scarring, Ecchymosis. The periwound skin appearance did not exhibit: Callus, Crepitus, Excoriation, Rash, Dry/Scaly, Maceration, Atrophie Blanche, Cyanosis, Hemosiderin Staining, Mottled, Pallor, Rubor, Erythema. Periwound temperature was noted as No Abnormality. The periwound has tenderness on palpation. Wound #3 status is Healed - Epithelialized. Original cause of wound was Not Known. The wound is located on the Left Abdomen - midline. The wound measures 0cm length x 0cm width x 0cm depth; 0cm^2 area and 0cm^3 volume. There is Fat Layer (Subcutaneous Tissue) Exposed exposed. There is no tunneling or undermining noted. There is a none present amount of drainage noted. The wound margin is flat and intact. There is no granulation within the wound bed. There is no necrotic tissue within the wound bed. The periwound skin appearance did not exhibit: Callus, Crepitus,  Excoriation, Induration, Rash, Scarring, Dry/Scaly, Maceration, Atrophie Blanche, Cyanosis, Ecchymosis, Hemosiderin Staining, Mottled, Pallor, Rubor, Erythema. Periwound temperature was noted as No Abnormality. Assessment Active Problems ICD-10 L02.211 - Cutaneous abscess of abdominal wall S31.109S - Unspecified open wound of abdominal wall, unspecified quadrant without penetration into peritoneal cavity, sequela E11.622 - Type 2 diabetes mellitus with other skin ulcer E66.01 - Morbid (severe) obesity due to excess calories Plan Wound Cleansing: Wound #2 Right Abdomen - midline: Clean wound with Normal Saline. May Shower, gently pat wound dry prior to applying new dressing. Anesthetic (add to Medication List): Wound #2 Right Abdomen - midline: Topical Lidocaine 4% cream applied to wound bed prior to debridement (In Clinic Only). Skin Barriers/Peri-Wound Care: Wound #2 Right Abdomen - midline: Other: - lotrimin cream to reddened areas Primary Wound Dressing: [name], [name] ([id number]) Wound #2 Right Abdomen - midline: Other: - Endoform all wounds Secondary Dressing: Wound #2 Right Abdomen - midline: Boardered Foam Dressing - all wounds Dressing Change Frequency: Wound #2 Right Abdomen - midline: Change dressing every other day. Follow-up Appointments: Wound #2 Right Abdomen - midline: Return Appointment in 2 weeks. Additional Orders / Instructions: Wound #2 Right Abdomen - midline: Increase protein intake. Activity as tolerated #1 continue the Endoform to the remaining area on the right lower abdomen. See if this will epithelialize #2 the area on the left is healed #3 I've advised her to keep the area on the left covered as when she is standing there is likely overlying pannus Electronic Signature(s) Signed: 12/12/2017 1:16:01 PM By: Baltazar Najjar MD Entered By: Baltazar Najjar on 12/11/2017 17:15:26 [name], [name]  ([id number]) -------------------------------------------------------------------------------- SuperBill Details Patient Name: [name] Date of Service: 12/11/2017 Medical Record Number: [id number] Patient Account Number: [id number] Date of Birth/Sex: 03-Dec-1975 (41 y.o. F) Treating RN: Elliot Gurney,  Kim Primary Care Provider: Cheryll Dessert Other Clinician: Referring Provider: Cheryll Dessert Treating Provider/Extender: Altamese [place] in Treatment: 7 Diagnosis Coding ICD-10 Codes Code Description L02.211 Cutaneous abscess of abdominal wall Unspecified open wound of abdominal wall, unspecified quadrant without penetration into peritoneal S31.109S cavity, sequela E11.622 Type 2 diabetes mellitus with other skin ulcer E66.01 Morbid (severe) obesity due to excess calories Facility Procedures CPT4 Code: [id number] Description: [id number] - WOUND CARE VISIT-LEV 2 EST PT Modifier: Quantity: 1 Physician Procedures CPT4: Description Modifier Quantity Code [id number] - WC PHYS LEVEL 2 - EST PT 1 ICD-10 Diagnosis Description S31.109S Unspecified open wound of abdominal wall, unspecified quadrant without penetration into peritoneal cavity, sequela L02.211 Cutaneous  abscess of abdominal wall Electronic Signature(s) Signed: 12/12/2017 1:16:01 PM By: Baltazar Najjar MD Entered By: Baltazar Najjar on 12/11/2017 17:15:52

## 2017-12-14 NOTE — Progress Notes (Signed)
[name], [name] ([id number]) Visit Report for 12/11/2017 Arrival Information Details Patient Name: [name], Flicker Date of Service: 12/11/2017 3:30 PM Medical Record Number: [id number] Patient Account Number: [id number] Date of Birth/Sex: Nov 08, 1975 (42 y.o. F) Treating RN: Renne Crigler Primary Care Sindi Beckworth: Cheryll Dessert Other Clinician: Referring Glynnis Gavel: Cheryll Dessert Treating Charlette Hennings/Extender: [facility] [place] in Treatment: 7 Visit Information History Since Last Visit All ordered tests and consults were completed: No Patient Arrived: Wheel Chair Added or deleted any medications: No Arrival Time: 15:31 Any new allergies or adverse reactions: No Accompanied By: husband Had a fall or experienced change in No activities of daily living that may affect Transfer Assistance: None risk of falls: Patient Identification Verified: Yes Signs or symptoms of abuse/neglect since last visito No Secondary Verification Process Completed: Yes Hospitalized since last visit: No Patient Requires Transmission-Based No Implantable device outside of the clinic excluding No Precautions: cellular tissue based products placed in the center Patient Has Alerts: Yes since last visit: Patient Alerts: DMII Pain Present Now: No aspirin 81 Electronic Signature(s) Signed: 12/11/2017 4:46:18 PM By: Renne Crigler Entered By: Renne Crigler on 12/11/2017 15:33:20 [name], Florentina Addison ([id number]) -------------------------------------------------------------------------------- Clinic Level of Care Assessment Details Patient Name: [name] Date of Service: 12/11/2017 3:30 PM Medical Record Number: [id number] Patient Account Number: [id number] Date of Birth/Sex: 1976-01-19 (42 y.o. F) Treating RN: Huel Coventry Primary Care Hermenia Fritcher: Cheryll Dessert Other Clinician: Referring Annarae Macnair: Cheryll Dessert Treating Jasyn Mey/Extender: [facility] [place] in Treatment: 7 Clinic Level of Care  Assessment Items TOOL 4 Quantity Score  - Use when only an EandM is performed on FOLLOW-UP visit 0 ASSESSMENTS - Nursing Assessment / Reassessment  - Reassessment of Co-morbidities (includes updates in patient status) 0 X- 1 5 Reassessment of Adherence to Treatment Plan ASSESSMENTS - Wound and Skin Assessment / Reassessment X - Simple Wound Assessment / Reassessment - one wound 1 5  - 0 Complex Wound Assessment / Reassessment - multiple wounds  - 0 Dermatologic / Skin Assessment (not related to wound area) ASSESSMENTS - Focused Assessment  - Circumferential Edema Measurements - multi extremities 0  - 0 Nutritional Assessment / Counseling / Intervention  - 0 Lower Extremity Assessment (monofilament, tuning fork, pulses)  - 0 Peripheral Arterial Disease Assessment (using hand held doppler) ASSESSMENTS - Ostomy and/or Continence Assessment and Care  - Incontinence Assessment and Management 0  - 0 Ostomy Care Assessment and Management (repouching, etc.) PROCESS - Coordination of Care X - Simple Patient / Family Education for ongoing care 1 15  - 0 Complex (extensive) Patient / Family Education for ongoing care  - 0 Staff obtains Chiropractor, Records, Test Results / Process Orders  - 0 Staff telephones HHA, Nursing Homes / Clarify orders / etc  - 0 Routine Transfer to another Facility (non-emergent condition)  - 0 Routine Hospital Admission (non-emergent condition)  - 0 New Admissions / Manufacturing engineer / Ordering NPWT, Apligraf, etc.  - 0 Emergency Hospital Admission (emergent condition) X- 1 10 Simple Discharge Coordination [name], Skylin ([id number])  - 0 Complex (extensive) Discharge Coordination PROCESS - Special Needs  - Pediatric / Minor Patient Management 0  - 0 Isolation Patient Management  - 0 Hearing / Language / Visual special needs  - 0 Assessment of Community assistance (transportation, D/C planning,  etc.)  - 0 Additional assistance / Altered mentation  - 0 Support Surface(s) Assessment (bed, cushion, seat, etc.) INTERVENTIONS - Wound Cleansing / Measurement X - Simple Wound Cleansing - one wound 1  5  - 0 Complex Wound Cleansing - multiple wounds X- 1 5 Wound Imaging (photographs - any number of wounds)  - 0 Wound Tracing (instead of photographs) X- 1 5 Simple Wound Measurement - one wound  - 0 Complex Wound Measurement - multiple wounds INTERVENTIONS - Wound Dressings X - Small Wound Dressing one or multiple wounds 1 10  - 0 Medium Wound Dressing one or multiple wounds  - 0 Large Wound Dressing one or multiple wounds  - 0 Application of Medications - topical  - 0 Application of Medications - injection INTERVENTIONS - Miscellaneous  - External ear exam 0  - 0 Specimen Collection (cultures, biopsies, blood, body fluids, etc.)  - 0 Specimen(s) / Culture(s) sent or taken to Lab for analysis  - 0 Patient Transfer (multiple staff / Nurse, adult / Similar devices)  - 0 Simple Staple / Suture removal (25 or less)  - 0 Complex Staple / Suture removal (26 or more)  - 0 Hypo / Hyperglycemic Management (close monitor of Blood Glucose)  - 0 Ankle / Brachial Index (ABI) - do not check if billed separately X- 1 5 Vital Signs [name], Alegandra ([id number]) Has the patient been seen at the hospital within the last three years: Yes Total Score: 65 Level Of Care: New/Established - Level 2 Electronic Signature(s) Signed: 12/11/2017 5:47:31 PM By: Elliot Gurney, BSN, RN, CWS, Kim RN, BSN Entered By: Elliot Gurney, BSN, RN, CWS, Kim on 12/11/2017 16:03:51 [name], Florentina Addison ([id number]) -------------------------------------------------------------------------------- Encounter Discharge Information Details Patient Name: [name] Date of Service: 12/11/2017 3:30 PM Medical Record Number: [id number] Patient Account Number: [id number] Date of Birth/Sex: 22-May-1976  (42 y.o. F) Treating RN: Renne Crigler Primary Care Monike Bragdon: Cheryll Dessert Other Clinician: Referring Duayne Brideau: Cheryll Dessert Treating Willadean Guyton/Extender: Altamese [place] in Treatment: 7 Encounter Discharge Information Items Discharge Condition: Stable Ambulatory Status: Wheelchair Discharge Destination: Home Transportation: Private Auto Accompanied By: boyfriend Schedule Follow-up Appointment: Yes Clinical Summary of Care: Electronic Signature(s) Signed: 12/11/2017 4:46:18 PM By: Renne Crigler Entered By: Renne Crigler on 12/11/2017 16:16:38 [name], Florentina Addison ([id number]) -------------------------------------------------------------------------------- Lower Extremity Assessment Details Patient Name: [name] Date of Service: 12/11/2017 3:30 PM Medical Record Number: [id number] Patient Account Number: [id number] Date of Birth/Sex: [birth year] (42 y.o. F) Treating RN: Renne Crigler Primary Care Requan Hardge: Cheryll Dessert Other Clinician: Referring Trista Ciocca: Cheryll Dessert Treating Yanky Vanderburg/Extender: [facility]amese [place] in Treatment: 7 Electronic Signature(s) Signed: 12/11/2017 4:46:18 PM By: Renne Crigler Entered By: Renne Crigler on 12/11/2017 15:39:15 [name], Florentina Addison ([id number]) -------------------------------------------------------------------------------- Multi Wound Chart Details Patient Name: [name] Date of Service: 12/11/2017 3:30 PM Medical Record Number: [id number] Patient Account Number: [id number] Date of Birth/Sex: 04/10/1976 (42 y.o. F) Treating RN: Huel Coventry Primary Care Azim Gillingham: Cheryll Dessert Other Clinician: Referring Moriya Mitchell: Cheryll Dessert Treating Tiyah Zelenak/Extender: Altamese  in Treatment: 7 Vital Signs Height(in): 66 Pulse(bpm): 97 Weight(lbs): 385 Blood Pressure(mmHg): 148/71 Body Mass Index(BMI): 62 Temperature(F): 98.0 Respiratory Rate 18 (breaths/min): Photos:  [N/A:N/A] Wound Location: Right Abdomen - midline Left Abdomen - midline N/A Wounding Event: Bump Not Known N/A Primary Etiology: Abscess Atypical N/A Comorbid History: Lymphedema, Hypertension, Lymphedema, Hypertension, N/A Type II Diabetes, Neuropathy Type II Diabetes, Neuropathy Date Acquired: 10/07/2017 08/12/2017 N/A Weeks of Treatment: 7 7 N/A Wound Status: Open Healed - Epithelialized N/A Measurements L x W x D 0.5x0.5x0.1 0x0x0 N/A (cm) Area (cm) : 0.196 0 N/A Volume (cm) : 0.02 0 N/A % Reduction in Area: 65.30% 100.00% N/A % Reduction in Volume: 95.60%  100.00% N/A Classification: Full Thickness Without Full Thickness Without N/A Exposed Support Structures Exposed Support Structures Exudate Amount: Large None Present N/A Exudate Type: Sanguinous N/A N/A Exudate Color: red N/A N/A Wound Margin: Flat and Intact Flat and Intact N/A Granulation Amount: Large (67-100%) None Present (0%) N/A Granulation Quality: Red N/A N/A Necrotic Amount: None Present (0%) None Present (0%) N/A Exposed Structures: Fat Layer (Subcutaneous Fat Layer (Subcutaneous N/A Tissue) Exposed: Yes Tissue) Exposed: Yes Fascia: No Fascia: No Tendon: No Tendon: No Muscle: No Muscle: No [name], Carleena ([id number]) Joint: No Joint: No Bone: No Bone: No Epithelialization: Small (1-33%) None N/A Periwound Skin Texture: Induration: Yes Excoriation: No N/A Scarring: Yes Induration: No Excoriation: No Callus: No Callus: No Crepitus: No Crepitus: No Rash: No Rash: No Scarring: No Periwound Skin Moisture: Maceration: No Maceration: No N/A Dry/Scaly: No Dry/Scaly: No Periwound Skin Color: Ecchymosis: Yes Atrophie Blanche: No N/A Atrophie Blanche: No Cyanosis: No Cyanosis: No Ecchymosis: No Erythema: No Erythema: No Hemosiderin Staining: No Hemosiderin Staining: No Mottled: No Mottled: No Pallor: No Pallor: No Rubor: No Rubor: No Temperature: No Abnormality No Abnormality  N/A Tenderness on Palpation: Yes No N/A Wound Preparation: Ulcer Cleansing: Ulcer Cleansing: N/A Rinsed/Irrigated with Saline Rinsed/Irrigated with Saline Topical Anesthetic Applied: Other: lidocaine 4% Treatment Notes Wound #2 (Right Abdomen - midline) 1. Cleansed with: Clean wound with Normal Saline 2. Anesthetic Topical Lidocaine 4% cream to wound bed prior to debridement 4. Dressing Applied: Other dressing (specify in notes) 5. Secondary Dressing Applied Bordered Foam Dressing Notes endoform Electronic Signature(s) Signed: 12/12/2017 1:16:01 PM By: Baltazar Najjar MD Entered By: Baltazar Najjar on 12/11/2017 17:10:44 [name], Florentina Addison ([id number]) -------------------------------------------------------------------------------- Multi-Disciplinary Care Plan Details Patient Name: [name] Date of Service: 12/11/2017 3:30 PM Medical Record Number: [id number] Patient Account Number: [id number] Date of Birth/Sex: August 28, 1975 (42 y.o. F) Treating RN: Huel Coventry Primary Care Jeanni Allshouse: Cheryll Dessert Other Clinician: Referring Cabela Pacifico: Cheryll Dessert Treating Gerrad Welker/Extender: Altamese [place] in Treatment: 7 Active Inactive ` Abuse / Safety / Falls / Self Care Management Nursing Diagnoses: Impaired physical mobility Goals: Patient will not experience any injury related to falls Date Initiated: 10/23/2017 Target Resolution Date: 01/11/2018 Goal Status: Active Interventions: Assess fall risk on admission and as needed Notes: ` Nutrition Nursing Diagnoses: Imbalanced nutrition Goals: Patient/caregiver agrees to and verbalizes understanding of need to use nutritional supplements and/or vitamins as prescribed Date Initiated: 10/23/2017 Target Resolution Date: 01/11/2018 Goal Status: Active Interventions: Assess patient nutrition upon admission and as needed per policy Notes: ` Orientation to the Wound Care Program Nursing Diagnoses: Knowledge deficit  related to the wound healing center program Goals: Patient/caregiver will verbalize understanding of the Wound Healing Center Program Date Initiated: 10/23/2017 Target Resolution Date: 01/11/2018 Goal Status: Active Interventions: [name], Naseem ([id number]) Provide education on orientation to the wound center Notes: ` Wound/Skin Impairment Nursing Diagnoses: Impaired tissue integrity Goals: Ulcer/skin breakdown will heal within 14 weeks Date Initiated: 10/23/2017 Target Resolution Date: 01/11/2018 Goal Status: Active Interventions: Assess patient/caregiver ability to obtain necessary supplies Assess patient/caregiver ability to perform ulcer/skin care regimen upon admission and as needed Assess ulceration(s) every visit Notes: Electronic Signature(s) Signed: 12/11/2017 5:47:31 PM By: Elliot Gurney, BSN, RN, CWS, Kim RN, BSN Entered By: Elliot Gurney, BSN, RN, CWS, Kim on 12/11/2017 16:00:39 [name], Florentina Addison ([id number]) -------------------------------------------------------------------------------- Pain Assessment Details Patient Name: [name] Date of Service: 12/11/2017 3:30 PM Medical Record Number: [id number] Patient Account Number: [id number] Date of Birth/Sex: May 28, 1976 (42 y.o. F) Treating RN: Renne Crigler  Primary Care Arvel Oquinn: Cheryll Dessert Other Clinician: Referring Jermany Sundell: Cheryll Dessert Treating Malay Fantroy/Extender: Altamese [place] in Treatment: 7 Active Problems Location of Pain Severity and Description of Pain Patient Has Paino No Site Locations Pain Management and Medication Current Pain Management: Electronic Signature(s) Signed: 12/11/2017 4:46:18 PM By: Renne Crigler Entered By: Renne Crigler on 12/11/2017 15:33:27 [name], Florentina Addison ([id number]) -------------------------------------------------------------------------------- Patient/Caregiver Education Details Patient Name: [name] Date of Service: 12/11/2017 3:30 PM Medical Record  Number: [id number] Patient Account Number: [id number] Date of Birth/Gender: [birth year] (42 y.o. F) Treating RN: Renne Crigler Primary Care Physician: Cheryll Dessert Other Clinician: Referring Physician: Cheryll Dessert Treating Physician/Extender: Altamese [place] in Treatment: 7 Education Assessment Education Provided To: Patient Education Topics Provided Wound/Skin Impairment: Handouts: Caring for Your Ulcer Methods: Explain/Verbal Responses: State content correctly Electronic Signature(s) Signed: 12/11/2017 4:46:18 PM By: Renne Crigler Entered By: Renne Crigler on 12/11/2017 16:16:51 [name], Florentina Addison ([id number]) -------------------------------------------------------------------------------- Wound Assessment Details Patient Name: [name] Date of Service: 12/11/2017 3:30 PM Medical Record Number: [id number] Patient Account Number: [id number] Date of Birth/Sex: 1976/03/29 (42 y.o. F) Treating RN: Renne Crigler Primary Care Lyrik Buresh: Cheryll Dessert Other Clinician: Referring Sarajean Dessert: Cheryll Dessert Treating [place] Goh/Extender: [facility] [place] in Treatment: 7 Wound Status Wound Number: 2 Primary Abscess Etiology: Wound Location: Right Abdomen - midline Wound Status: Open Wounding Event: Bump Comorbid Lymphedema, Hypertension, Type II Date Acquired: 10/07/2017 History: Diabetes, Neuropathy Weeks Of Treatment: 7 Clustered Wound: No Photos Photo Uploaded By: Renne Crigler on 12/11/2017 16:48:13 Wound Measurements Length: (cm) 0.5 Width: (cm) 0.5 Depth: (cm) 0.1 Area: (cm) 0.196 Volume: (cm) 0.02 % Reduction in Area: 65.3% % Reduction in Volume: 95.6% Epithelialization: Small (1-33%) Tunneling: No Undermining: No Wound Description Full Thickness Without Exposed Support Classification: Structures Wound Margin: Flat and Intact Exudate Large Amount: Exudate Type: Sanguinous Exudate Color: red Foul Odor After Cleansing:  No Slough/Fibrino Yes Wound Bed Granulation Amount: Large (67-100%) Exposed Structure Granulation Quality: Red Fascia Exposed: No Necrotic Amount: None Present (0%) Fat Layer (Subcutaneous Tissue) Exposed: Yes Tendon Exposed: No Muscle Exposed: No Joint Exposed: No Bone Exposed: No [name], Amaliya ([id number]) Periwound Skin Texture Texture Color No Abnormalities Noted: No No Abnormalities Noted: No Callus: No Atrophie Blanche: No Crepitus: No Cyanosis: No Excoriation: No Ecchymosis: Yes Induration: Yes Erythema: No Rash: No Hemosiderin Staining: No Scarring: Yes Mottled: No Pallor: No Moisture Rubor: No No Abnormalities Noted: No Dry / Scaly: No Temperature / Pain Maceration: No Temperature: No Abnormality Tenderness on Palpation: Yes Wound Preparation Ulcer Cleansing: Rinsed/Irrigated with Saline Topical Anesthetic Applied: Other: lidocaine 4%, Treatment Notes Wound #2 (Right Abdomen - midline) 1. Cleansed with: Clean wound with Normal Saline 2. Anesthetic Topical Lidocaine 4% cream to wound bed prior to debridement 4. Dressing Applied: Other dressing (specify in notes) 5. Secondary Dressing Applied Bordered Foam Dressing Notes endoform Electronic Signature(s) Signed: 12/11/2017 4:46:18 PM By: Renne Crigler Entered By: Renne Crigler on 12/11/2017 15:39:59 [name], Florentina Addison ([id number]) -------------------------------------------------------------------------------- Wound Assessment Details Patient Name: [name] Date of Service: 12/11/2017 3:30 PM Medical Record Number: [id number] Patient Account Number: [id number] Date of Birth/Sex: 12/12/1975 (42 y.o. F) Treating RN: Huel Coventry Primary Care Eulises Kijowski: Cheryll Dessert Other Clinician: Referring [place] Lusty: Cheryll Dessert Treating Tyhir Schwan/Extender: Altamese [place] in Treatment: 7 Wound Status Wound Number: 3 Primary Atypical Etiology: Wound Location: Left Abdomen - midline Wound  Status: Healed - Epithelialized Wounding Event: Not Known Comorbid Lymphedema, Hypertension, Type II Date Acquired: 08/12/2017 History: Diabetes, Neuropathy Weeks Of  Treatment: 7 Clustered Wound: No Photos Photo Uploaded By: Renne Crigler on 12/11/2017 16:48:14 Wound Measurements Length: (cm) 0 % Re Width: (cm) 0 % Re Depth: (cm) 0 Epit Area: (cm) 0 Tun Volume: (cm) 0 Und duction in Area: 100% duction in Volume: 100% helialization: None neling: No ermining: No Wound Description Full Thickness Without Exposed Support Classification: Structures Wound Margin: Flat and Intact Exudate None Present Amount: Foul Odor After Cleansing: No Slough/Fibrino No Wound Bed Granulation Amount: None Present (0%) Exposed Structure Necrotic Amount: None Present (0%) Fascia Exposed: No Fat Layer (Subcutaneous Tissue) Exposed: Yes Tendon Exposed: No Muscle Exposed: No Joint Exposed: No Bone Exposed: No Periwound Skin Texture Texture Color [name], Kjerstin ([id number]) No Abnormalities Noted: No No Abnormalities Noted: No Callus: No Atrophie Blanche: No Crepitus: No Cyanosis: No Excoriation: No Ecchymosis: No Induration: No Erythema: No Rash: No Hemosiderin Staining: No Scarring: No Mottled: No Pallor: No Moisture Rubor: No No Abnormalities Noted: No Dry / Scaly: No Temperature / Pain Maceration: No Temperature: No Abnormality Wound Preparation Ulcer Cleansing: Rinsed/Irrigated with Saline Electronic Signature(s) Signed: 12/11/2017 5:47:31 PM By: Elliot Gurney, BSN, RN, CWS, Kim RN, BSN Entered By: Elliot Gurney, BSN, RN, CWS, Kim on 12/11/2017 16:01:40 [name], Florentina Addison ([id number]) -------------------------------------------------------------------------------- Vitals Details Patient Name: [name] Date of Service: 12/11/2017 3:30 PM Medical Record Number: [id number] Patient Account Number: [id number] Date of Birth/Sex: [birth year] (42 y.o. F) Treating RN: Renne Crigler Primary Care Veto Macqueen: Cheryll Dessert Other Clinician: Referring Tiberius Loftus: Cheryll Dessert Treating Jamillah Camilo/Extender: Altamese [place] in Treatment: 7 Vital Signs Time Taken: 15:34 Temperature (F): 98.0 Height (in): 66 Pulse (bpm): 97 Weight (lbs): 385 Respiratory Rate (breaths/min): 18 Body Mass Index (BMI): 62.1 Blood Pressure (mmHg): 148/71 Reference Range: 80 - 120 mg / dl Electronic Signature(s) Signed: 12/11/2017 4:46:18 PM By: Renne Crigler Entered By: Renne Crigler on 12/11/2017 15:34:31

## 2017-12-23 ENCOUNTER — Other Ambulatory Visit: Payer: Self-pay | Admitting: Nurse Practitioner

## 2017-12-23 DIAGNOSIS — N6310 Unspecified lump in the right breast, unspecified quadrant: Secondary | ICD-10-CM

## 2017-12-25 ENCOUNTER — Ambulatory Visit: Payer: Medicaid Other | Admitting: Internal Medicine

## 2018-01-01 ENCOUNTER — Encounter: Payer: Medicaid Other | Admitting: Internal Medicine

## 2018-01-01 DIAGNOSIS — E11622 Type 2 diabetes mellitus with other skin ulcer: Secondary | ICD-10-CM | POA: Diagnosis not present

## 2018-01-03 NOTE — Progress Notes (Signed)
Brooke CoonDUKE Day, [name] ([id number]) Visit Report for 01/01/2018 HPI Details Patient Name: Brooke ROSFlorentina Day, Brooke Date of Service: 01/01/2018 3:45 PM Medical Record Number: [id number] Patient Account Number: [id number] Date of Birth/Sex: 16-Jan-1976 (42 y.o. F) Treating RN: Huel CoventryWoody, Kim Primary Care Provider: Cheryll DessertGEYER, KATHERINE Other Clinician: Referring Provider: Cheryll DessertGEYER, KATHERINE Treating Provider/Extender: Altamese CarolinaOBSON, MICHAEL G Weeks in Treatment: 10 History of Present Illness HPI Description: 10/23/17-she is here for initial evaluation of 2 abdominal wounds. She is a type I diabetic with an insulin pump, she developed an abscess to the right side of her abdomen at an insertion sites. She underwent incision and drainage on March 5 and was packed with iodoform packing. The packing was maintained until her follow-up appointment on March 13 at which time it was replaced. She has not been instructed to change packing daily and packing has been maintained. She continues to have some induration and pain although significantly improved. Originally after incision and drainage she was placed on Augmentin and Bactrim which she completed Saturday 3/16. she underwent a biopsy of a mole to her left lateral abdomen and is being followed by dermatology with plans for wide excision of "abnormal cells" in June. She is on chronic suppressive antibiotic therapy (doxycycline) for hidradenitis. Her last A1c was 7.2 in November and states her daily glucose levels are normally in the low 100s. 10/30/17; patient admitted to our clinic last week. She is a type I diabetic. She had an abscess develop on the right side of her lower abdomen at the site of an insulin pump. This underwent an IandD on March 5. She also underwent an unrelated biopsy of a mole on her lateral abdomen by dermatology. This had "abnormal cells" however I have not actually seen this report. She is due for a wider excision soon 11/05/17; patient has 2 wounds on her abdomen  one was an abscess site on the right the other a surgical biopsy site. She has been using silver collagen but not making much progress the area on the right still has considerable undermining although the actual tissue looks healthy. The area on the left necrotic surface debris. Changed her to Endoform today 11/13/17; 2 wounds on her abdomen. On one on the right side was an abscess site and the other on the left a surgical biopsy site. It's a surgical biopsy site that seems to be more difficult. Using Endoform the area on the right which was an abscess IandD looks a lot better. Less undermining healthier looking tissue still requires debridement 11/20/17; 2 wounds on her abdomen. On the right side was an abscess site and on the left flank a surgical biopsy site. Both wounds look better. We've been using Endoform. 12/11/17; been 3 weeks since I saw this lady. On the right side of her abdomen was an abscess site that underwent an IandD and on the left flank a surgical biopsy site. The area on the left flank is fully epithelialized. Skin slightly discolored. Per the patient's description she was told that the biopsy of a mole in this area was atypical and she is apparently going to have further skin surgery in June although she doesn't have a date. In any case at this point this area is closed The abscess site on the right lower abdomen had undermining although this is completely filled in with normal granulation there is no undermining and no evidence of surrounding infection 01/01/18; again it's been 3 weeks since this patient was here. Fortunately the areas on the right side of  her abdomen is totally closed. This was a surgical IandD site from an abscess. In the area on her left flank which was a surgical biopsy site is also closed. Both of these areas are fully epithelialized. She is going to have further skin surgery in June by her dermatologist I think a repeat debridement of this area although I have  never really seen the pathology results. Electronic Signature(s) Signed: 01/02/2018 8:24:56 AM By: Baltazar Najjar MD Entered By: Baltazar Najjar on 01/01/2018 16:37:48 [name], [name] ([id number]) -------------------------------------------------------------------------------- Physical Exam Details Patient Name: [name] Date of Service: 01/01/2018 3:45 PM Medical Record Number: [id number] Patient Account Number: [id number] Date of Birth/Sex: 08/07/1975 (42 y.o. F) Treating RN: Huel Coventry Primary Care Provider: Cheryll Dessert Other Clinician: Referring Provider: Cheryll Dessert Treating Provider/Extender: Altamese [place] in Treatment: 10 Constitutional Sitting or standing Blood Pressure is within target range for patient.. Pulse regular and within target range for patient.Marland Kitchen Respirations regular, non-labored and within target range.. Temperature is normal and within the target range for the patient.Marland Kitchen appears in no distress. Notes wound exam oOn the right mid abdomen the area is fully epithelialized. oAlso on the left abdominal flank the areas fully epithelialized here as well. This is the area where she is apparently planning to undergo further excision although I never did see the actual pathology results Electronic Signature(s) Signed: 01/02/2018 8:24:56 AM By: Baltazar Najjar MD Entered By: Baltazar Najjar on 01/01/2018 16:38:51 [name], [name] ([id number]) -------------------------------------------------------------------------------- Physician Orders Details Patient Name: [name] Date of Service: 01/01/2018 3:45 PM Medical Record Number: [id number] Patient Account Number: [id number] Date of Birth/Sex: November 13, 1975 (42 y.o. F) Treating RN: Huel Coventry Primary Care Provider: Cheryll Dessert Other Clinician: Referring Provider: Cheryll Dessert Treating Provider/Extender: Altamese [place] in Treatment: 10 Verbal / Phone Orders: No Diagnosis  Coding Discharge From [facility] Services o Discharge from Wound Care Center - treatment complete Electronic Signature(s) Signed: 01/01/2018 5:21:30 PM By: Elliot Gurney, BSN, RN, CWS, Kim RN, BSN Signed: 01/02/2018 8:24:56 AM By: Baltazar Najjar MD Entered By: Elliot Gurney, BSN, RN, CWS, Kim on 01/01/2018 16:05:46 [name], [name] ([id number]) -------------------------------------------------------------------------------- Problem List Details Patient Name: [name] Date of Service: 01/01/2018 3:45 PM Medical Record Number: [id number] Patient Account Number: [id number] Date of Birth/Sex: 26-Feb-1976 (42 y.o. F) Treating RN: Huel Coventry Primary Care Provider: Cheryll Dessert Other Clinician: Referring Provider: Cheryll Dessert Treating Provider/Extender: Altamese [place] in Treatment: 10 Active Problems ICD-10 Impacting Encounter Code Description Active Date Wound Healing Diagnosis L02.211 Cutaneous abscess of abdominal wall 10/23/2017 Yes S31.109S Unspecified open wound of abdominal wall, unspecified 10/23/2017 Yes quadrant without penetration into peritoneal cavity, sequela E11.622 Type 2 diabetes mellitus with other skin ulcer 10/23/2017 Yes E66.01 Morbid (severe) obesity due to excess calories 10/23/2017 Yes Inactive Problems Resolved Problems Electronic Signature(s) Signed: 01/02/2018 8:24:56 AM By: Baltazar Najjar MD Entered By: Baltazar Najjar on 01/01/2018 16:36:12 [name], [name] ([id number]) -------------------------------------------------------------------------------- Progress Note Details Patient Name: [name] Date of Service: 01/01/2018 3:45 PM Medical Record Number: [id number] Patient Account Number: [id number] Date of Birth/Sex: [birth year] (42 y.o. F) Treating RN: Huel Coventry Primary Care Provider: Cheryll Dessert Other Clinician: Referring Provider: Cheryll Dessert Treating Provider/Extender: Altamese [place] in Treatment: 10 Subjective History of  Present Illness (HPI) 10/23/17-she is here for initial evaluation of 2 abdominal wounds. She is a type I diabetic with an insulin pump, she developed an abscess to the right side of her abdomen at an insertion sites. She underwent  incision and drainage on March 5 and was packed with iodoform packing. The packing was maintained until her follow-up appointment on March 13 at which time it was replaced. She has not been instructed to change packing daily and packing has been maintained. She continues to have some induration and pain although significantly improved. Originally after incision and drainage she was placed on Augmentin and Bactrim which she completed Saturday 3/16. she underwent a biopsy of a mole to her left lateral abdomen and is being followed by dermatology with plans for wide excision of "abnormal cells" in June. She is on chronic suppressive antibiotic therapy (doxycycline) for hidradenitis. Her last A1c was 7.2 in November and states her daily glucose levels are normally in the low 100s. 10/30/17; patient admitted to our clinic last week. She is a type I diabetic. She had an abscess develop on the right side of her lower abdomen at the site of an insulin pump. This underwent an IandD on March 5. She also underwent an unrelated biopsy of a mole on her lateral abdomen by dermatology. This had "abnormal cells" however I have not actually seen this report. She is due for a wider excision soon 11/05/17; patient has 2 wounds on her abdomen one was an abscess site on the right the other a surgical biopsy site. She has been using silver collagen but not making much progress the area on the right still has considerable undermining although the actual tissue looks healthy. The area on the left necrotic surface debris. Changed her to Endoform today 11/13/17; 2 wounds on her abdomen. On one on the right side was an abscess site and the other on the left a surgical biopsy site. It's a surgical  biopsy site that seems to be more difficult. Using Endoform the area on the right which was an abscess IandD looks a lot better. Less undermining healthier looking tissue still requires debridement 11/20/17; 2 wounds on her abdomen. On the right side was an abscess site and on the left flank a surgical biopsy site. Both wounds look better. We've been using Endoform. 12/11/17; been 3 weeks since I saw this lady. On the right side of her abdomen was an abscess site that underwent an IandD and on the left flank a surgical biopsy site. The area on the left flank is fully epithelialized. Skin slightly discolored. Per the patient's description she was told that the biopsy of a mole in this area was atypical and she is apparently going to have further skin surgery in June although she doesn't have a date. In any case at this point this area is closed The abscess site on the right lower abdomen had undermining although this is completely filled in with normal granulation there is no undermining and no evidence of surrounding infection 01/01/18; again it's been 3 weeks since this patient was here. Fortunately the areas on the right side of her abdomen is totally closed. This was a surgical IandD site from an abscess. In the area on her left flank which was a surgical biopsy site is also closed. Both of these areas are fully epithelialized. She is going to have further skin surgery in June by her dermatologist I think a repeat debridement of this area although I have never really seen the pathology results. Objective Constitutional Sitting or standing Blood Pressure is within target range for patient.. Pulse regular and within target range for patient.Kateri Mc Day, Carrye ([id number]) Respirations regular, non-labored and within target range.. Temperature is normal and  within the target range for the patient.Marland Kitchen appears in no distress. Vitals Time Taken: 3:51 PM, Height: 66 in, Weight: 385 lbs, BMI: 62.1,  Temperature: 98.4 F, Pulse: 93 bpm, Respiratory Rate: 18 breaths/min, Blood Pressure: 134/79 mmHg. General Notes: wound exam On the right mid abdomen the area is fully epithelialized. Also on the left abdominal flank the areas fully epithelialized here as well. This is the area where she is apparently planning to undergo further excision although I never did see the actual pathology results Integumentary (Hair, Skin) Wound #2 status is Healed - Epithelialized. Original cause of wound was Bump. The wound is located on the Right Abdomen - midline. The wound measures 0cm length x 0cm width x 0cm depth; 0cm^2 area and 0cm^3 volume. There is no tunneling or undermining noted. There is a none present amount of drainage noted. The wound margin is flat and intact. There is no granulation within the wound bed. There is no necrotic tissue within the wound bed. The periwound skin appearance exhibited: Induration, Scarring. The periwound skin appearance did not exhibit: Callus, Crepitus, Excoriation, Rash, Dry/Scaly, Maceration, Atrophie Blanche, Cyanosis, Ecchymosis, Hemosiderin Staining, Mottled, Pallor, Rubor, Erythema. Periwound temperature was noted as No Abnormality. The periwound has tenderness on palpation. Assessment Active Problems ICD-10 L02.211 - Cutaneous abscess of abdominal wall S31.109S - Unspecified open wound of abdominal wall, unspecified quadrant without penetration into peritoneal cavity, sequela E11.622 - Type 2 diabetes mellitus with other skin ulcer E66.01 - Morbid (severe) obesity due to excess calories Plan Discharge From [facility] Services: Discharge from Wound Care Center - treatment complete #1 the patient can be discharged from the wound care center #2 if the repeat skin surgery in the left that part of her abdomen requires our assistance her dermatologist will need to refer her. If that's the case I would at least like to know the pathology that they're treating  here Electronic Signature(s) Signed: 01/02/2018 8:24:56 AM By: Baltazar Najjar MD Entered By: Baltazar Najjar on 01/01/2018 16:39:27 [name], [name] ([id number]) [name], [name] ([id number]) -------------------------------------------------------------------------------- SuperBill Details Patient Name: [name] Date of Service: 01/01/2018 Medical Record Number: [id number] Patient Account Number: [id number] Date of Birth/Sex: 01-17-1976 (42 y.o. F) Treating RN: Huel Coventry Primary Care Provider: Cheryll Dessert Other Clinician: Referring Provider: Cheryll Dessert Treating Provider/Extender: Altamese [place] in Treatment: 10 Diagnosis Coding ICD-10 Codes Code Description L02.211 Cutaneous abscess of abdominal wall Unspecified open wound of abdominal wall, unspecified quadrant without penetration into peritoneal S31.109S cavity, sequela E11.622 Type 2 diabetes mellitus with other skin ulcer E66.01 Morbid (severe) obesity due to excess calories Facility Procedures CPT4 Code: [id number] Description: [phone] - WOUND CARE VISIT-LEV 1 EST PT Modifier: Quantity: 1 Physician Procedures CPT4: Description Modifier Quantity Code [id number] - WC PHYS LEVEL 2 - EST PT 1 ICD-10 Diagnosis Description L02.211 Cutaneous abscess of abdominal wall S31.109S Unspecified open wound of abdominal wall, unspecified quadrant without penetration into  peritoneal cavity, sequela Electronic Signature(s) Signed: 01/02/2018 8:24:56 AM By: Baltazar Najjar MD Entered By: Baltazar Najjar on 01/01/2018 16:40:12

## 2018-01-05 NOTE — Progress Notes (Signed)
NELEH, MULDOON ([id number]) Visit Report for 01/01/2018 Arrival Information Details Patient Name: [name], [name] Date of Service: 01/01/2018 3:45 PM Medical Record Number: [id number] Patient Account Number: [id number] Date of Birth/Sex: March 06, 1976 (42 y.o. F) Treating RN: Phillis Haggis Primary Care Lazer Wollard: Cheryll Dessert Other Clinician: Referring Cassidy Tabet: Cheryll Dessert Treating Lacinda Curvin/Extender: Altamese [place] in Treatment: 10 Visit Information History Since Last Visit All ordered tests and consults were completed: No Patient Arrived: Wheel Chair Added or deleted any medications: No Arrival Time: 15:48 Any new allergies or adverse reactions: No Accompanied By: boyfriend Had a fall or experienced change in No Transfer Assistance: EasyPivot Patient activities of daily living that may affect Lift risk of falls: Patient Identification Verified: Yes Signs or symptoms of abuse/neglect since last visito No Secondary Verification Process Yes Hospitalized since last visit: No Completed: Implantable device outside of the clinic excluding No Patient Requires Transmission-Based No cellular tissue based products placed in the center Precautions: since last visit: Patient Has Alerts: Yes Has Dressing in Place as Prescribed: Yes Patient Alerts: DMII Pain Present Now: Yes aspirin 81 Electronic Signature(s) Signed: 01/03/2018 5:38:20 PM By: Alejandro Mulling Entered By: Alejandro Mulling on 01/01/2018 15:50:25 [name], [name] ([id number]) -------------------------------------------------------------------------------- Clinic Level of Care Assessment Details Patient Name: [name] Date of Service: 01/01/2018 3:45 PM Medical Record Number: [id number] Patient Account Number: [id number] Date of Birth/Sex: 07/20/1976 (42 y.o. F) Treating RN: Huel Coventry Primary Care Theodis Kinsel: Cheryll Dessert Other Clinician: Referring Sanuel Ladnier: Cheryll Dessert Treating  Terran Klinke/Extender: Altamese [place] in Treatment: 10 Clinic Level of Care Assessment Items TOOL 4 Quantity Score []  - Use when only an EandM is performed on FOLLOW-UP visit 0 ASSESSMENTS - Nursing Assessment / Reassessment []  - Reassessment of Co-morbidities (includes updates in patient status) 0 []  - 0 Reassessment of Adherence to Treatment Plan ASSESSMENTS - Wound and Skin Assessment / Reassessment X - Simple Wound Assessment / Reassessment - one wound 1 5 []  - 0 Complex Wound Assessment / Reassessment - multiple wounds []  - 0 Dermatologic / Skin Assessment (not related to wound area) ASSESSMENTS - Focused Assessment []  - Circumferential Edema Measurements - multi extremities 0 []  - 0 Nutritional Assessment / Counseling / Intervention []  - 0 Lower Extremity Assessment (monofilament, tuning fork, pulses) []  - 0 Peripheral Arterial Disease Assessment (using hand held doppler) ASSESSMENTS - Ostomy and/or Continence Assessment and Care []  - Incontinence Assessment and Management 0 []  - 0 Ostomy Care Assessment and Management (repouching, etc.) PROCESS - Coordination of Care X - Simple Patient / Family Education for ongoing care 1 15 []  - 0 Complex (extensive) Patient / Family Education for ongoing care []  - 0 Staff obtains Chiropractor, Records, Test Results / Process Orders []  - 0 Staff telephones HHA, Nursing Homes / Clarify orders / etc []  - 0 Routine Transfer to another Facility (non-emergent condition) []  - 0 Routine Hospital Admission (non-emergent condition) []  - 0 New Admissions / Manufacturing engineer / Ordering NPWT, Apligraf, etc. []  - 0 Emergency Hospital Admission (emergent condition) X- 1 10 Simple Discharge Coordination [name], [name] ([id number]) []  - 0 Complex (extensive) Discharge Coordination PROCESS - Special Needs []  - Pediatric / Minor Patient Management 0 []  - 0 Isolation Patient Management []  - 0 Hearing / Language / Visual special  needs []  - 0 Assessment of Community assistance (transportation, D/C planning, etc.) []  - 0 Additional assistance / Altered mentation []  - 0 Support Surface(s) Assessment (bed, cushion, seat, etc.) INTERVENTIONS - Wound Cleansing / Measurement []  -  Simple Wound Cleansing - one wound 0 []  - 0 Complex Wound Cleansing - multiple wounds []  - 0 Wound Imaging (photographs - any number of wounds) []  - 0 Wound Tracing (instead of photographs) []  - 0 Simple Wound Measurement - one wound []  - 0 Complex Wound Measurement - multiple wounds INTERVENTIONS - Wound Dressings []  - Small Wound Dressing one or multiple wounds 0 []  - 0 Medium Wound Dressing one or multiple wounds []  - 0 Large Wound Dressing one or multiple wounds []  - 0 Application of Medications - topical []  - 0 Application of Medications - injection INTERVENTIONS - Miscellaneous []  - External ear exam 0 []  - 0 Specimen Collection (cultures, biopsies, blood, body fluids, etc.) []  - 0 Specimen(s) / Culture(s) sent or taken to Lab for analysis []  - 0 Patient Transfer (multiple staff / Nurse, adult / Similar devices) []  - 0 Simple Staple / Suture removal (25 or less) []  - 0 Complex Staple / Suture removal (26 or more) []  - 0 Hypo / Hyperglycemic Management (close monitor of Blood Glucose) []  - 0 Ankle / Brachial Index (ABI) - do not check if billed separately X- 1 5 Vital Signs [name], [name] ([id number]) Has the patient been seen at the hospital within the last three years: Yes Total Score: 35 Level Of Care: New/Established - Level 1 Electronic Signature(s) Signed: 01/01/2018 5:21:30 PM By: Elliot Gurney, BSN, RN, CWS, Kim RN, BSN Entered By: Elliot Gurney, BSN, RN, CWS, Kim on 01/01/2018 16:19:04 [name], [name] ([id number]) -------------------------------------------------------------------------------- Encounter Discharge Information Details Patient Name: [name] Date of Service: 01/01/2018 3:45 PM Medical Record  Number: [id number] Patient Account Number: [id number] Date of Birth/Sex: June 02, 1976 (42 y.o. F) Treating RN: Huel Coventry Primary Care Emanuele Mcwhirter: Cheryll Dessert Other Clinician: Referring Kailani Brass: Cheryll Dessert Treating Ariz Terrones/Extender: Altamese [place] in Treatment: 10 Encounter Discharge Information Items Discharge Condition: Stable Ambulatory Status: Wheelchair Discharge Destination: Home Transportation: Private Auto Accompanied By: self Schedule Follow-up Appointment: No Clinical Summary of Care: Electronic Signature(s) Signed: 01/01/2018 4:19:29 PM By: Elliot Gurney, BSN, RN, CWS, Kim RN, BSN Entered By: Elliot Gurney, BSN, RN, CWS, Kim on 01/01/2018 16:19:29 [name], [name] ([id number]) -------------------------------------------------------------------------------- Lower Extremity Assessment Details Patient Name: [name] Date of Service: 01/01/2018 3:45 PM Medical Record Number: [id number] Patient Account Number: [id number] Date of Birth/Sex: 08-07-1975 (42 y.o. F) Treating RN: Phillis Haggis Primary Care Andry Bogden: Cheryll Dessert Other Clinician: Referring Jonty Morrical: Cheryll Dessert Treating Joslynn Jamroz/Extender: Maxwell Caul Weeks in Treatment: 10 Electronic Signature(s) Signed: 01/03/2018 5:38:20 PM By: Alejandro Mulling Entered By: Alejandro Mulling on 01/01/2018 15:55:50 [name], [name] ([id number]) -------------------------------------------------------------------------------- Multi Wound Chart Details Patient Name: [name] Date of Service: 01/01/2018 3:45 PM Medical Record Number: [id number] Patient Account Number: [id number] Date of Birth/Sex: November 01, 1975 (42 y.o. F) Treating RN: Huel Coventry Primary Care Fujie Dickison: Cheryll Dessert Other Clinician: Referring Sharai Overbay: Cheryll Dessert Treating Solana Coggin/Extender: Altamese [place] in Treatment: 10 Vital Signs Height(in): 66 Pulse(bpm): 93 Weight(lbs): 385 Blood Pressure(mmHg): 134/79 Body Mass  Index(BMI): 62 Temperature(F): 98.4 Respiratory Rate 18 (breaths/min): Photos: [2:No Photos] [N/A:N/A] Wound Location: [2:Right Abdomen - midline] [N/A:N/A] Wounding Event: [2:Bump] [N/A:N/A] Primary Etiology: [2:Abscess] [N/A:N/A] Comorbid History: [2:Lymphedema, Hypertension, Type II Diabetes, Neuropathy] [N/A:N/A] Date Acquired: [2:10/07/2017] [N/A:N/A] Weeks of Treatment: [2:10] [N/A:N/A] Wound Status: [2:Healed - Epithelialized] [N/A:N/A] Measurements L x W x D [2:0x0x0] [N/A:N/A] (cm) Area (cm) : [2:0] [N/A:N/A] Volume (cm) : [2:0] [N/A:N/A] % Reduction in Area: [2:100.00%] [N/A:N/A] % Reduction in Volume: [2:100.00%] [N/A:N/A] Classification: [2:Full Thickness Without  Exposed Support Structures] [N/A:N/A] Exudate Amount: [2:None Present] [N/A:N/A] Wound Margin: [2:Flat and Intact] [N/A:N/A] Granulation Amount: [2:None Present (0%)] [N/A:N/A] Necrotic Amount: [2:None Present (0%)] [N/A:N/A] Exposed Structures: [2:Fascia: No Fat Layer (Subcutaneous Tissue) Exposed: No Tendon: No Muscle: No Joint: No Bone: No] [N/A:N/A] Epithelialization: [2:Large (67-100%)] [N/A:N/A] Periwound Skin Texture: [2:Induration: Yes Scarring: Yes Excoriation: No Callus: No Crepitus: No Rash: No] [N/A:N/A] Periwound Skin Moisture: [2:Maceration: No Dry/Scaly: No] [N/A:N/A] Periwound Skin Color: Atrophie Blanche: No N/A N/A Cyanosis: No Ecchymosis: No Erythema: No Hemosiderin Staining: No Mottled: No Pallor: No Rubor: No Temperature: No Abnormality N/A N/A Tenderness on Palpation: Yes N/A N/A Wound Preparation: Ulcer Cleansing: N/A N/A Rinsed/Irrigated with Saline Topical Anesthetic Applied: None Treatment Notes Electronic Signature(s) Signed: 01/02/2018 8:24:56 AM By: Baltazar Najjarobson, Michael MD Entered By: Baltazar Najjarobson, Michael on 01/01/2018 16:36:34 [name], Brooke AddisonKATIE ([id number]) -------------------------------------------------------------------------------- Multi-Disciplinary Care Plan  Details Patient Name: Brooke [name], [name] Date of Service: 01/01/2018 3:45 PM Medical Record Number: [id number] Patient Account Number: [id number] Date of Birth/Sex: 1976-05-24 (42 y.o. F) Treating RN: Huel CoventryWoody, Kim Primary Care Chioke Noxon: Cheryll DessertGEYER, KATHERINE Other Clinician: Referring Undra Harriman: Cheryll DessertGEYER, KATHERINE Treating Belynda Pagaduan/Extender: Altamese CarolinaOBSON, MICHAEL G Weeks in Treatment: 10 Active Inactive Electronic Signature(s) Signed: 01/01/2018 5:21:30 PM By: Elliot GurneyWoody, BSN, RN, CWS, Kim RN, BSN Entered By: Elliot GurneyWoody, BSN, RN, CWS, Kim on 01/01/2018 16:04:57 [name], Brooke AddisonKATIE ([id number]) -------------------------------------------------------------------------------- Pain Assessment Details Patient Name: Brooke [name], [name] Date of Service: 01/01/2018 3:45 PM Medical Record Number: [id number] Patient Account Number: [id number] Date of Birth/Sex: 1976-05-24 (42 y.o. F) Treating RN: Phillis HaggisPinkerton, Debi Primary Care Kasheem Toner: Cheryll DessertGEYER, KATHERINE Other Clinician: Referring Edelin Fryer: Cheryll DessertGEYER, KATHERINE Treating Deaken Jurgens/Extender: Altamese CarolinaOBSON, MICHAEL G Weeks in Treatment: 10 Active Problems Location of Pain Severity and Description of Pain Patient Has Paino Yes Site Locations Pain Location: Pain in Ulcers Rate the pain. Current Pain Level: 5 Pain Management and Medication Current Pain Management: Notes Topical or injectable lidocaine is offered to patient for acute pain when surgical debridement is performed. If needed, Patient is instructed to use over the counter pain medication for the following 24-48 hours after debridement. Wound care MDs do not prescribed pain medications. Patient has chronic pain or uncontrolled pain. Patient has been instructed to make an appointment with their Primary Care Physician for pain management. Electronic Signature(s) Signed: 01/03/2018 5:38:20 PM By: Alejandro MullingPinkerton, Debra Entered By: Alejandro MullingPinkerton, Debra on 01/01/2018 15:51:04 [name], Brooke AddisonKATIE  ([id number]) -------------------------------------------------------------------------------- Wound Assessment Details Patient Name: Brooke [name], [name] Date of Service: 01/01/2018 3:45 PM Medical Record Number: [id number] Patient Account Number: [id number] Date of Birth/Sex: 1976-05-24 (42 y.o. F) Treating RN: Phillis HaggisPinkerton, Debi Primary Care Jae Skeet: Cheryll DessertGEYER, KATHERINE Other Clinician: Referring Douglas Smolinsky: Cheryll DessertGEYER, KATHERINE Treating Elzy Tomasello/Extender: Altamese CarolinaOBSON, MICHAEL G Weeks in Treatment: 10 Wound Status Wound Number: 2 Primary Abscess Etiology: Wound Location: Right Abdomen - midline Wound Status: Healed - Epithelialized Wounding Event: Bump Comorbid Lymphedema, Hypertension, Type II Date Acquired: 10/07/2017 History: Diabetes, Neuropathy Weeks Of Treatment: 10 Clustered Wound: No Photos Photo Uploaded By: Alejandro MullingPinkerton, Debra on 01/02/2018 08:20:02 Wound Measurements Length: (cm) 0 % R Width: (cm) 0 % R Depth: (cm) 0 Epi Area: (cm) 0 Tu Volume: (cm) 0 Un eduction in Area: 100% eduction in Volume: 100% thelialization: Large (67-100%) nneling: No dermining: No Wound Description Full Thickness Without Exposed Support Classification: Structures Wound Margin: Flat and Intact Exudate None Present Amount: Foul Odor After Cleansing: No Slough/Fibrino No Wound Bed Granulation Amount: None Present (0%) Exposed Structure Necrotic Amount: None Present (0%) Fascia Exposed: No Fat Layer (Subcutaneous Tissue) Exposed: No Tendon Exposed: No  Muscle Exposed: No Joint Exposed: No Bone Exposed: No Periwound Skin Texture Texture Color No Abnormalities Noted: No No Abnormalities Noted: No Callus: No Atrophie Blanche: No [name], [name] ([id number]) Crepitus: No Cyanosis: No Excoriation: No Ecchymosis: No Induration: Yes Erythema: No Rash: No Hemosiderin Staining: No Scarring: Yes Mottled: No Pallor: No Moisture Rubor: No No Abnormalities Noted: No Dry / Scaly: No Temperature  / Pain Maceration: No Temperature: No Abnormality Tenderness on Palpation: Yes Wound Preparation Ulcer Cleansing: Rinsed/Irrigated with Saline Topical Anesthetic Applied: None Electronic Signature(s) Signed: 01/03/2018 5:38:20 PM By: Alejandro Mulling Entered By: Alejandro Mulling on 01/01/2018 15:55:24 [name], [name] ([id number]) -------------------------------------------------------------------------------- Vitals Details Patient Name: [name] Date of Service: 01/01/2018 3:45 PM Medical Record Number: [id number] Patient Account Number: [id number] Date of Birth/Sex: Sep 08, 1975 (42 y.o. F) Treating RN: Phillis Haggis Primary Care Nell Gales: Cheryll Dessert Other Clinician: Referring Hanni Milford: Cheryll Dessert Treating Shepard Keltz/Extender: Altamese [place] in Treatment: 10 Vital Signs Time Taken: 15:51 Temperature (F): 98.4 Height (in): 66 Pulse (bpm): 93 Weight (lbs): 385 Respiratory Rate (breaths/min): 18 Body Mass Index (BMI): 62.1 Blood Pressure (mmHg): 134/79 Reference Range: 80 - 120 mg / dl Electronic Signature(s) Signed: 01/03/2018 5:38:20 PM By: Alejandro Mulling Entered By: Alejandro Mulling on 01/01/2018 15:53:05

## 2018-01-08 ENCOUNTER — Ambulatory Visit
Admission: RE | Admit: 2018-01-08 | Discharge: 2018-01-08 | Disposition: A | Discharge status: Home / Self Care | Payer: Medicaid Other | Source: Ambulatory Visit | Attending: Nurse Practitioner | Admitting: Nurse Practitioner

## 2018-01-08 DIAGNOSIS — N6310 Unspecified lump in the right breast, unspecified quadrant: Secondary | ICD-10-CM

## 2018-04-23 ENCOUNTER — Encounter: Payer: Self-pay | Admitting: *Deleted

## 2018-04-24 ENCOUNTER — Encounter
Admission: RE | Disposition: A | Discharge status: Home / Self Care | Payer: Self-pay | Source: Ambulatory Visit | Attending: Gastroenterology

## 2018-04-24 ENCOUNTER — Ambulatory Visit: Payer: Medicaid Other | Admitting: Anesthesiology

## 2018-04-24 ENCOUNTER — Ambulatory Visit
Admission: RE | Admit: 2018-04-24 | Discharge: 2018-04-24 | Disposition: A | Discharge status: Home / Self Care | Payer: Medicaid Other | Source: Ambulatory Visit | Attending: Gastroenterology | Admitting: Gastroenterology

## 2018-04-24 DIAGNOSIS — I10 Essential (primary) hypertension: Secondary | ICD-10-CM | POA: Insufficient documentation

## 2018-04-24 DIAGNOSIS — K3189 Other diseases of stomach and duodenum: Secondary | ICD-10-CM | POA: Diagnosis not present

## 2018-04-24 DIAGNOSIS — Z7984 Long term (current) use of oral hypoglycemic drugs: Secondary | ICD-10-CM | POA: Insufficient documentation

## 2018-04-24 DIAGNOSIS — Z7982 Long term (current) use of aspirin: Secondary | ICD-10-CM | POA: Diagnosis not present

## 2018-04-24 DIAGNOSIS — M797 Fibromyalgia: Secondary | ICD-10-CM | POA: Insufficient documentation

## 2018-04-24 DIAGNOSIS — G4730 Sleep apnea, unspecified: Secondary | ICD-10-CM | POA: Insufficient documentation

## 2018-04-24 DIAGNOSIS — F329 Major depressive disorder, single episode, unspecified: Secondary | ICD-10-CM | POA: Insufficient documentation

## 2018-04-24 DIAGNOSIS — Z79899 Other long term (current) drug therapy: Secondary | ICD-10-CM | POA: Insufficient documentation

## 2018-04-24 DIAGNOSIS — Z791 Long term (current) use of non-steroidal anti-inflammatories (NSAID): Secondary | ICD-10-CM | POA: Insufficient documentation

## 2018-04-24 DIAGNOSIS — R1013 Epigastric pain: Secondary | ICD-10-CM | POA: Diagnosis present

## 2018-04-24 DIAGNOSIS — E785 Hyperlipidemia, unspecified: Secondary | ICD-10-CM | POA: Diagnosis not present

## 2018-04-24 DIAGNOSIS — Z6841 Body Mass Index (BMI) 40.0 and over, adult: Secondary | ICD-10-CM | POA: Insufficient documentation

## 2018-04-24 DIAGNOSIS — Z7951 Long term (current) use of inhaled steroids: Secondary | ICD-10-CM | POA: Diagnosis not present

## 2018-04-24 HISTORY — PX: ESOPHAGOGASTRODUODENOSCOPY (EGD) WITH PROPOFOL: SHX5813

## 2018-04-24 HISTORY — DX: Restless legs syndrome: G25.81

## 2018-04-24 HISTORY — DX: Fibromyalgia: M79.7

## 2018-04-24 HISTORY — DX: Sleep apnea, unspecified: G47.30

## 2018-04-24 LAB — GLUCOSE, CAPILLARY: GLUCOSE-CAPILLARY: 241 mg/dL — AB (ref 70–99)

## 2018-04-24 LAB — POCT PREGNANCY, URINE: PREG TEST UR: NEGATIVE

## 2018-04-24 SURGERY — ESOPHAGOGASTRODUODENOSCOPY (EGD) WITH PROPOFOL
Anesthesia: General

## 2018-04-24 MED ORDER — PROPOFOL 500 MG/50ML IV EMUL
INTRAVENOUS | Status: AC
  Filled 2018-04-24: qty 50

## 2018-04-24 MED ORDER — PROPOFOL 10 MG/ML IV BOLUS
INTRAVENOUS | Status: AC
  Filled 2018-04-24: qty 20

## 2018-04-24 MED ORDER — LIDOCAINE HCL (PF) 1 % IJ SOLN
INTRAMUSCULAR | Status: AC
  Administered 2018-04-24: 0.3 mL
  Filled 2018-04-24: qty 2

## 2018-04-24 MED ORDER — PROPOFOL 500 MG/50ML IV EMUL
INTRAVENOUS | Status: DC | PRN
  Administered 2018-04-24: 75 ug/kg/min via INTRAVENOUS

## 2018-04-24 MED ORDER — PROPOFOL 10 MG/ML IV BOLUS
INTRAVENOUS | Status: DC | PRN
  Administered 2018-04-24 (×4): 20 mg via INTRAVENOUS
  Administered 2018-04-24: 50 mg via INTRAVENOUS

## 2018-04-24 MED ORDER — LIDOCAINE HCL (PF) 2 % IJ SOLN
INTRAMUSCULAR | Status: DC | PRN
  Administered 2018-04-24: 100 mg via INTRADERMAL

## 2018-04-24 MED ORDER — SODIUM CHLORIDE 0.9 % IV SOLN
INTRAVENOUS | Status: DC
  Administered 2018-04-24: 1000 mL via INTRAVENOUS

## 2018-04-24 NOTE — Transfer of Care (Signed)
Immediate Anesthesia Transfer of Care Note  Patient: [name]  Procedure(s) Performed: ESOPHAGOGASTRODUODENOSCOPY (EGD) WITH PROPOFOL (N/A )  Patient Location: PACU  Anesthesia Type:General  Level of Consciousness: awake  Airway & Oxygen Therapy: Patient Spontanous Breathing and Patient connected to nasal cannula oxygen  Post-op Assessment: Report given to RN and Post -op Vital signs reviewed and stable  Post vital signs: Reviewed and stable  Last Vitals:  Vitals Value Taken Time  BP 128/77 04/24/2018  8:48 AM  Temp 36.3 C 04/24/2018  8:48 AM  Pulse 93 04/24/2018  8:50 AM  Resp 22 04/24/2018  8:50 AM  SpO2 99 % 04/24/2018  8:50 AM  Vitals shown include unvalidated device data.  Last Pain:  Vitals:   04/24/18 0848  TempSrc:   PainSc: 0-No pain         Complications: No apparent anesthesia complications

## 2018-04-24 NOTE — H&P (Signed)
Outpatient short stay form Pre-procedure 04/24/2018 8:12 AM Brooke DeemMartin U Aundraya Dripps MD  Primary Physician: Lenon OmsSarah Gauger, NP  Reason for visit: EGD  History of present illness: Patient is a 42 year old female presenting today as above.  She has a history of previous diagnosis of Barrett's esophagus however on her last evaluation this was negative.  Had been on a proton pump inhibitor had discontinued it for a while and has had a recurrence of dyspepsia.  Placed back on the medication about less than a month ago and symptoms have improved some.  There is left upper quadrant discomfort.  Does take 81 mg aspirin daily but denies use of other aspirin products.  However she does have a history of taking multiple ibuprofen daily.    Current Facility-Administered Medications:  .  0.9 %  sodium chloride infusion, , Intravenous, Continuous, Brooke DeemSkulskie, Sami Roes U, MD .  lidocaine (PF) (XYLOCAINE) 1 % injection, , , ,   Medications Prior to Admission  Medication Sig Dispense Refill Last Dose  . Adalimumab (HUMIRA) 40 MG/0.8ML PSKT Inject 40 mg into the skin once a week.   Past Month at Unknown time  . Albiglutide (TANZEUM) 50 MG PEN Inject 50 mg into the skin once a week. Reported on 11/21/2015   Not Taking at Unknown time  . Alpha Lipoic Acid 200 MG CAPS Take 200 mg by mouth daily.   11/21/2015 at 0600  . ascorbic Acid (VITAMIN C) 500 MG CPCR Take 500 mg by mouth daily.   Past Week at Unknown time  . aspirin EC 81 MG tablet Take 81 mg by mouth daily.   11/20/2015 at Unknown time  . atorvastatin (LIPITOR) 20 MG tablet Take 20 mg by mouth daily.   11/21/2015 at 0600  . b complex vitamins tablet Take 1 tablet by mouth daily. Reported on 11/21/2015   Not Taking at Unknown time  . cholecalciferol (VITAMIN D) 1000 units tablet Take 1,000 Units by mouth daily.   11/21/2015 at 0600  . dapagliflozin propanediol (FARXIGA) 10 MG TABS tablet Take 10 mg by mouth daily.   11/20/2015 at Unknown time  . doxycycline (VIBRA-TABS) 100  MG tablet Take 100 mg by mouth daily.   11/21/2015 at 0600  . DULoxetine (CYMBALTA) 30 MG capsule Take 30 mg by mouth daily.   11/20/2015 at Unknown time  . ergocalciferol (VITAMIN D2) 50000 UNITS capsule Take 50,000 Units by mouth 3 (three) times a week.    Past Month at Unknown time  . fluconazole (DIFLUCAN) 200 MG tablet Take 200 mg by mouth once a week.    Past Week at Unknown time  . fluticasone (FLONASE) 50 MCG/ACT nasal spray Place 2 sprays into both nostrils daily. Reported on 11/21/2015   Not Taking at Unknown time  . furosemide (LASIX) 20 MG tablet Take 20 mg by mouth.   11/21/2015 at 0600  . lisinopril (PRINIVIL,ZESTRIL) 10 MG tablet Take 10 mg by mouth daily.   11/20/2015 at Unknown time  . LORazepam (ATIVAN) 0.5 MG tablet Take 1 mg by mouth 2 (two) times daily.    11/21/2015 at 0600  . magnesium gluconate (MAGONATE) 500 MG tablet Take 250 mg by mouth 2 (two) times a week. Reported on 11/21/2015   Not Taking at Unknown time  . meloxicam (MOBIC) 15 MG tablet Take 15 mg by mouth daily.   Past Week at Unknown time  . metFORMIN (GLUCOPHAGE) 500 MG tablet Take 1,000 mg by mouth 2 (two) times daily with a meal.  11/20/2015 at Unknown time  . Omega-3 Fatty Acids (OMEGA-3 FISH OIL) 1200 MG CAPS Take 1 capsule by mouth daily at 8 pm.   11/20/2015 at Unknown time  . omeprazole (PRILOSEC) 20 MG capsule Take 40 mg by mouth daily.    11/20/2015 at Unknown time  . potassium chloride SA (K-DUR,KLOR-CON) 20 MEQ tablet Take 20 mEq by mouth daily.   11/21/2015 at 0600  . pregabalin (LYRICA) 150 MG capsule Take 150 mg by mouth 2 (two) times daily.   11/21/2015 at 0600  . traMADol (ULTRAM) 50 MG tablet Take 1 tablet (50 mg total) by mouth every 6 (six) hours as needed. 12 tablet 0 Past Month at Unknown time  . Turmeric Curcumin 500 MG CAPS Take 500 mg by mouth daily at 8 pm.   11/21/2015 at 0600     Allergies  Allergen Reactions  . Bee Pollen   . Hydrocodone   . Lamotrigine   . Percocet  [Oxycodone-Acetaminophen] Other (See Comments)    Reaction: really sick, couldn't move, dizzy, per patient felt like low blood sugar     Past Medical History:  Diagnosis Date  . Barrett's esophagus   . Bile-induced gastritis   . Depression   . Diabetes mellitus without complication (HCC)   . Diverticulitis   . Erosive esophagitis   . Fibromyalgia   . Gastropathy   . Hidradenitis suppurativa   . Hyperlipidemia   . Hypertension   . Neuropathy   . Obesity   . Obesity   . Obesity   . Redundant colon   . RLS (restless legs syndrome)   . Sleep apnea     Review of systems:      Physical Exam    Heart and lungs: Regular rate and rhythm without rub or gallop, lungs are bilaterally clear.    HEENT: Normocephalic atraumatic eyes are anicteric    Other:    Pertinant exam for procedure: Morbidly obese, bowel sounds are positive.  There is some mild discomfort to palpation in the left upper quadrant.  It is not possible to palpate internal organs.  There is no apparent rebound or mass.    Planned proceedures: EGD and indicated procedures. I have discussed the risks benefits and complications of procedures to include not limited to bleeding, infection, perforation and the risk of sedation and the patient wishes to proceed.    Brooke Deem, MD Gastroenterology 04/24/2018  8:12 AM

## 2018-04-24 NOTE — Op Note (Signed)
[facility] Patient Name: [name] Procedure Date: 04/24/2018 8:25 AM MRN: [id number] Account #: [id number] Date of Birth: [birth year] Admit Type: Outpatient Age: 42 Room: [facility] ENDO ROOM 1 Gender: Female Note Status: Finalized Procedure:            Upper GI endoscopy Indications:          Dyspepsia Providers:            Christena Deem, MD Referring MD:         No Local Md, MD (Referring MD) Medicines:            Monitored Anesthesia Care Complications:        No immediate complications. Procedure:            Pre-Anesthesia Assessment:                       - ASA Grade Assessment: III - A patient with severe                        systemic disease.                       After obtaining informed consent, the endoscope was                        passed under direct vision. Throughout the procedure,                        the patient's blood pressure, pulse, and oxygen                        saturations were monitored continuously. The Endoscope                        was introduced through the mouth, and advanced to the                        body of the stomach. The upper GI endoscopy was                        accomplished without difficulty. The patient tolerated                        the procedure well. Findings:      The Z-line was irregular.      There were esophageal mucosal changes suspicious for short-segment       Barrett's esophagus present in the lower third of the esophagus. The       maximum longitudinal extent of these mucosal changes was 1 cm in length.       Mucosa was biopsied with a cold forceps for histology in a targeted       manner and in 4 quadrants. One specimen bottle was sent to pathology.      Suspect gastroparesis due to large amount of retained gastric contents.       I did not advance beyond the mid body due to the large amount of       retained food and risk for aspiration. Impression:           - Z-line  irregular.                       -  Esophageal mucosal changes suspicious for                        short-segment Barrett's esophagus. Biopsied.                       - Gastroparesis, secondary to diabetes mellitus type I. Recommendation:       - Advance diet as tolerated to her usual diet.                       - Do a gastric emptying study at appointment to be                        scheduled.                       - Do an upper GI series at appointment to be scheduled.                       - Await pathology results.                       - Return to GI clinic in 3 weeks.                       - Use Protonix (pantoprazole) 40 mg PO daily daily. 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 All rights reserved. The codes documented in this report are preliminary and upon coder review may  be revised to meet current compliance requirements. Christena DeemMartin U Trini Soldo, MD 04/24/2018 8:48:54 AM This report has been signed electronically. Number of Addenda: 0 Note Initiated On: 04/24/2018 8:25 AM      [facility]

## 2018-04-24 NOTE — Anesthesia Postprocedure Evaluation (Signed)
Anesthesia Post Note  Patient: [name]  Procedure(s) Performed: ESOPHAGOGASTRODUODENOSCOPY (EGD) WITH PROPOFOL (N/A )  Patient location during evaluation: PACU Anesthesia Type: General Level of consciousness: awake and alert Pain management: pain level controlled Vital Signs Assessment: post-procedure vital signs reviewed and stable Respiratory status: spontaneous breathing, nonlabored ventilation, respiratory function stable and patient connected to nasal cannula oxygen Cardiovascular status: blood pressure returned to baseline and stable Postop Assessment: no apparent nausea or vomiting Anesthetic complications: no     Last Vitals:  Vitals:   04/24/18 0908 04/24/18 0918  BP: 125/84 127/83  Pulse: 97 83  Resp: 19 (!) 21  Temp:    SpO2: 97% 98%    Last Pain:  Vitals:   04/24/18 0918  TempSrc:   PainSc: 0-No pain                 Jovita GammaKathryn L Fitzgerald

## 2018-04-24 NOTE — Anesthesia Post-op Follow-up Note (Signed)
Anesthesia QCDR form completed.        

## 2018-04-24 NOTE — Anesthesia Preprocedure Evaluation (Addendum)
Anesthesia Evaluation  Patient identified by MRN, date of birth, ID band Patient awake    Reviewed: Allergy & Precautions, H&P , NPO status , Patient's Chart, lab work & pertinent test results  Airway Mallampati: III  TM Distance: >3 FB Neck ROM: full    Dental  (+) Teeth Intact   Pulmonary sleep apnea , former smoker,    breath sounds clear to auscultation       Cardiovascular hypertension,  Rhythm:regular Rate:Normal     Neuro/Psych PSYCHIATRIC DISORDERS Depression negative neurological ROS  negative psych ROS   GI/Hepatic negative GI ROS, Neg liver ROS, PUD,   Endo/Other  diabetesMorbid obesity (super morbid obesity, BMI 59)  Renal/GU negative Renal ROS  negative genitourinary   Musculoskeletal  (+) Fibromyalgia -  Abdominal   Peds  Hematology negative hematology ROS (+)   Anesthesia Other Findings Past Medical History: No date: Barrett's esophagus No date: Bile-induced gastritis No date: Depression No date: Diabetes mellitus without complication (HCC) No date: Diverticulitis No date: Erosive esophagitis No date: Fibromyalgia No date: Gastropathy No date: Hidradenitis suppurativa No date: Hyperlipidemia No date: Hypertension No date: Neuropathy No date: Obesity No date: Obesity No date: Obesity No date: Redundant colon No date: RLS (restless legs syndrome) No date: Sleep apnea  Past Surgical History: No date: COLONOSCOPY No date: COLONOSCOPY WITH ESOPHAGOGASTRODUODENOSCOPY (EGD) 11/21/2015: ESOPHAGOGASTRODUODENOSCOPY (EGD) WITH PROPOFOL; N/A     Comment:  Procedure: ESOPHAGOGASTRODUODENOSCOPY (EGD) WITH               PROPOFOL;  Surgeon: Christena DeemMartin U Skulskie, MD;  [facility];  Service: Endoscopy;  Laterality: N/A; No date: groin surgery No date: hidradenitis No date: scalp surgery No date: TONSILLECTOMY  BMI    Body Mass Index:  58.91 kg/m       Reproductive/Obstetrics negative OB ROS                            Anesthesia Physical Anesthesia Plan  ASA: III  Anesthesia Plan: General   Post-op Pain Management:    Induction:   PONV Risk Score and Plan: Propofol infusion and TIVA  Airway Management Planned: Natural Airway and Nasal Cannula  Additional Equipment:   Intra-op Plan:   Post-operative Plan:   Informed Consent: I have reviewed the patients History and Physical, chart, labs and discussed the procedure including the risks, benefits and alternatives for the proposed anesthesia with the patient or authorized representative who has indicated his/her understanding and acceptance.   Dental Advisory Given  Plan Discussed with: Anesthesiologist, CRNA and Surgeon  Anesthesia Plan Comments:         Anesthesia Quick Evaluation

## 2018-04-25 ENCOUNTER — Encounter: Payer: Self-pay | Admitting: Gastroenterology

## 2018-04-26 LAB — SURGICAL PATHOLOGY

## 2018-04-28 ENCOUNTER — Other Ambulatory Visit: Payer: Self-pay | Admitting: Gastroenterology

## 2018-04-28 DIAGNOSIS — K3184 Gastroparesis: Secondary | ICD-10-CM

## 2018-05-12 ENCOUNTER — Ambulatory Visit: Payer: Medicaid Other

## 2018-05-15 ENCOUNTER — Encounter
Admission: RE | Admit: 2018-05-15 | Discharge: 2018-05-15 | Disposition: A | Discharge status: Home / Self Care | Payer: Medicaid Other | Source: Ambulatory Visit | Attending: Gastroenterology | Admitting: Gastroenterology

## 2018-05-15 DIAGNOSIS — K3184 Gastroparesis: Secondary | ICD-10-CM | POA: Diagnosis present

## 2018-05-15 MED ORDER — TECHNETIUM TC 99M SULFUR COLLOID
2.3300 | Freq: Once | INTRAVENOUS | Status: AC | PRN
  Administered 2018-05-15: 2.33 via INTRAVENOUS

## 2018-05-19 ENCOUNTER — Ambulatory Visit
Admission: RE | Admit: 2018-05-19 | Discharge: 2018-05-19 | Disposition: A | Discharge status: Home / Self Care | Payer: Medicaid Other | Source: Ambulatory Visit | Attending: Gastroenterology | Admitting: Gastroenterology

## 2018-05-19 DIAGNOSIS — K3184 Gastroparesis: Secondary | ICD-10-CM

## 2018-05-19 DIAGNOSIS — K219 Gastro-esophageal reflux disease without esophagitis: Secondary | ICD-10-CM | POA: Diagnosis not present

## 2018-06-02 ENCOUNTER — Ambulatory Visit: Admit: 2018-06-02 | Discharge: 2018-06-03 | Payer: BLUE CROSS/BLUE SHIELD

## 2018-06-02 DIAGNOSIS — L732 Hidradenitis suppurativa: Principal | ICD-10-CM

## 2018-06-02 DIAGNOSIS — L4050 Arthropathic psoriasis, unspecified: Secondary | ICD-10-CM

## 2018-06-02 DIAGNOSIS — Z79899 Other long term (current) drug therapy: Secondary | ICD-10-CM

## 2018-06-02 DIAGNOSIS — L409 Psoriasis, unspecified: Secondary | ICD-10-CM

## 2018-06-17 ENCOUNTER — Encounter: Admit: 2018-06-17 | Discharge: 2018-06-18 | Payer: BLUE CROSS/BLUE SHIELD

## 2018-06-17 DIAGNOSIS — L732 Hidradenitis suppurativa: Principal | ICD-10-CM

## 2018-07-28 ENCOUNTER — Encounter: Admit: 2018-07-28 | Discharge: 2018-07-29 | Payer: BLUE CROSS/BLUE SHIELD

## 2018-07-28 DIAGNOSIS — R110 Nausea: Secondary | ICD-10-CM

## 2018-07-28 DIAGNOSIS — L732 Hidradenitis suppurativa: Principal | ICD-10-CM

## 2018-08-26 ENCOUNTER — Encounter: Admit: 2018-08-26 | Discharge: 2018-08-27 | Payer: BLUE CROSS/BLUE SHIELD

## 2018-08-26 DIAGNOSIS — L732 Hidradenitis suppurativa: Principal | ICD-10-CM

## 2018-09-01 ENCOUNTER — Encounter: Admit: 2018-09-01 | Discharge: 2018-09-02 | Payer: BLUE CROSS/BLUE SHIELD

## 2018-09-01 DIAGNOSIS — L4050 Arthropathic psoriasis, unspecified: Secondary | ICD-10-CM

## 2018-09-01 DIAGNOSIS — L910 Hypertrophic scar: Secondary | ICD-10-CM

## 2018-09-01 DIAGNOSIS — L409 Psoriasis, unspecified: Secondary | ICD-10-CM

## 2018-09-01 DIAGNOSIS — Z79899 Other long term (current) drug therapy: Secondary | ICD-10-CM

## 2018-09-01 DIAGNOSIS — L732 Hidradenitis suppurativa: Principal | ICD-10-CM

## 2018-09-01 MED ORDER — DOXYCYCLINE HYCLATE 100 MG CAPSULE
ORAL_CAPSULE | 3 refills | 0 days | Status: CP
Start: 2018-09-01 — End: ?

## 2018-09-01 MED ORDER — CALCIPOTRIENE 0.005 % TOPICAL CREAM
11 refills | 0 days | Status: CP
Start: 2018-09-01 — End: 2019-03-09

## 2018-11-05 ENCOUNTER — Encounter: Admit: 2018-11-05 | Discharge: 2018-11-06 | Payer: MEDICAID

## 2018-11-05 DIAGNOSIS — L732 Hidradenitis suppurativa: Principal | ICD-10-CM

## 2018-11-17 IMAGING — RF DG UGI W/ HIGH DENSITY W/KUB
12 of 20 series · 12 of 24 positions shown · non-contrast
Comparison: None.

CLINICAL DATA: Gastroparesis.  Abdominal pain and nausea

EXAM:
UPPER GI SERIES WITH KUB
TECHNIQUE: After obtaining a scout radiograph a routine upper GI series was
performed using thin and high density barium.
FLUOROSCOPY TIME:  Fluoroscopy Time:  0.9 minute
Radiation Exposure Index (if provided by the fluoroscopic device):
122.2 mGy
Number of Acquired Spot Images: 8

[Series 2: t abdomen supine · 0.15mm/px · 1 of 1 slices shown]
[im 1/1]
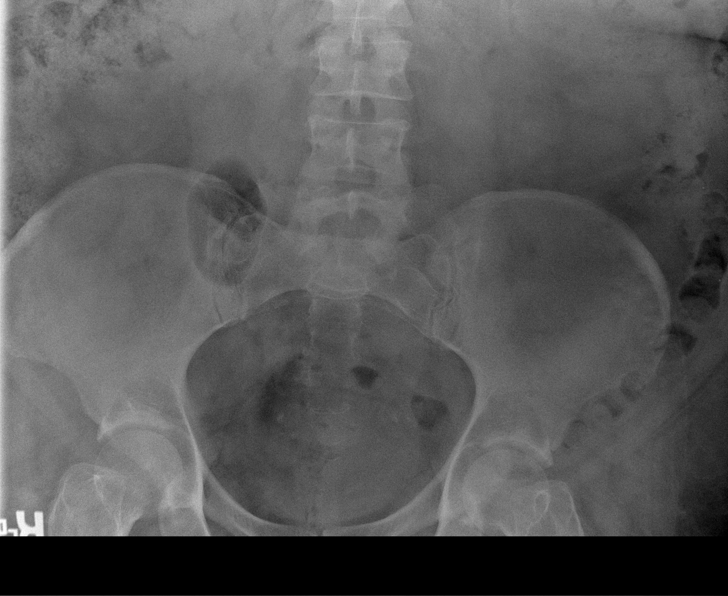

[Series 3: cp_standard · 0.50mm/px · 1 of 52 frames shown (1 of 7)]
[frame 27/52]
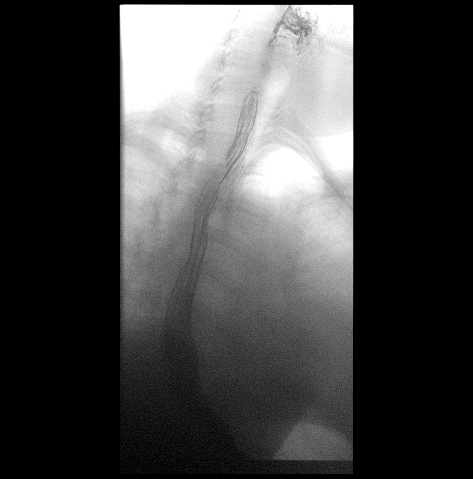

[Series 4: cp_standard · 0.50mm/px · 1 of 9 frames shown (2 of 7)]
[frame 2/9]
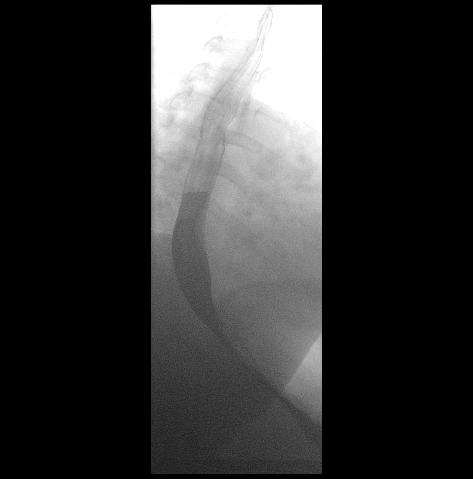

[Series 5: cp_standard · 0.51mm/px · 1 of 2 frames shown (3 of 7)]
[frame 1/2]
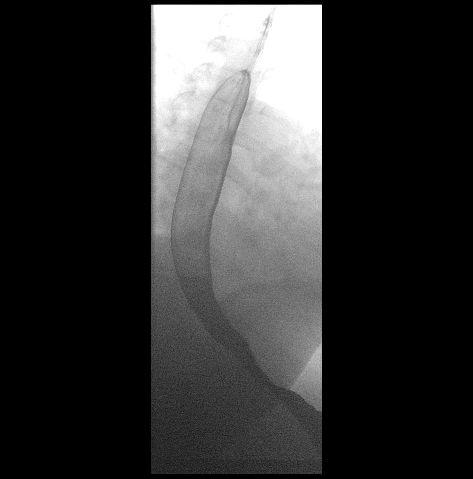

[Series 6: cp_standard · 0.25mm/px · 1 of 1 slices shown (4 of 7)]
[im 1/1]
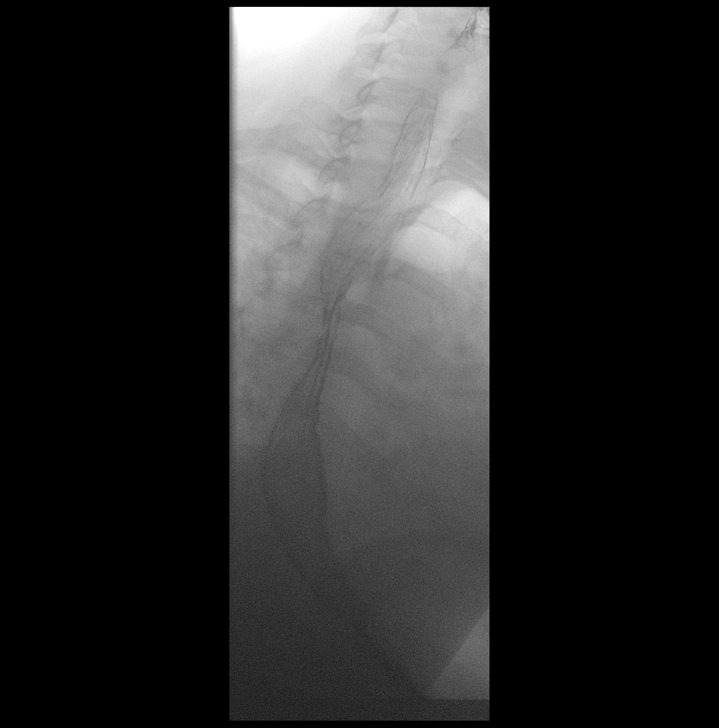

[Series 8: cp_standard · 0.25mm/px · 1 of 1 slices shown (5 of 7)]
[im 1/1]
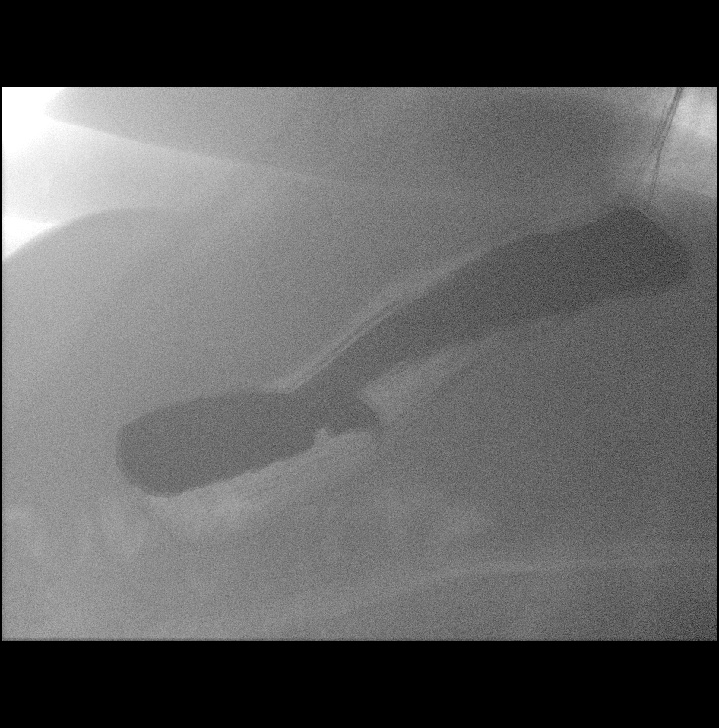

[Series 11: fluoro_barium 2fps_bw · 0.17mm/px · 1 of 1 slices shown (1 of 4)]
[im 1/1]
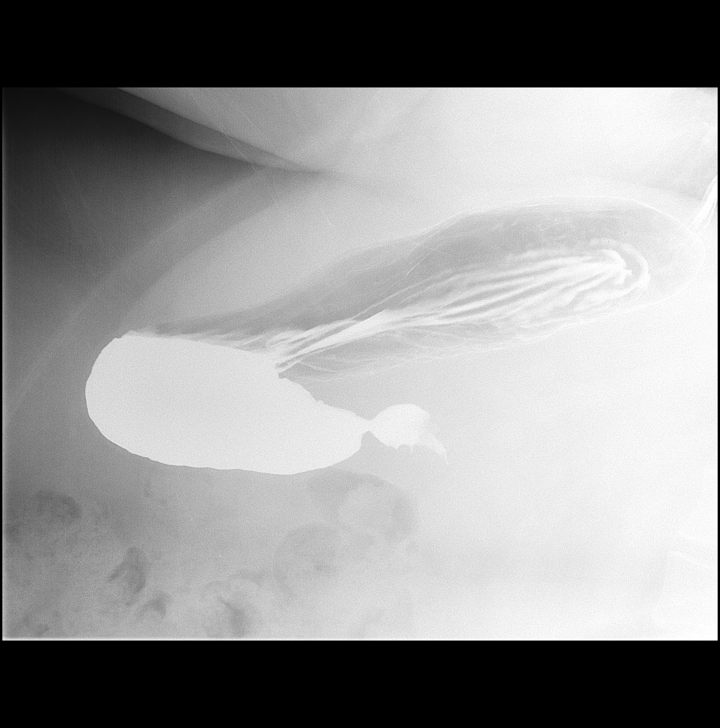

[Series 13: fluoro_barium 2fps_bw · 0.17mm/px · 1 of 1 slices shown (2 of 4)]
[im 1/1]
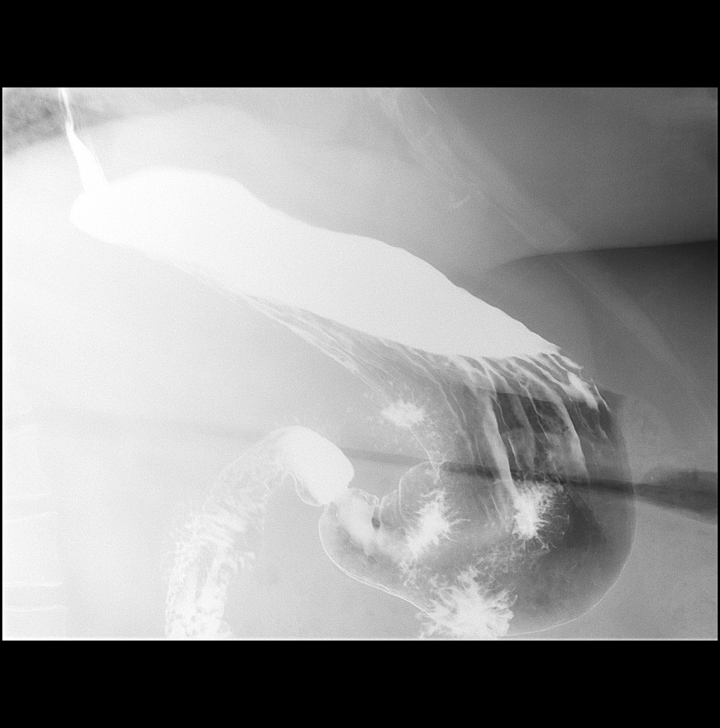

[Series 16: fluoro_barium 2fps_bw · 0.17mm/px · 1 of 1 slices shown (3 of 4)]
[im 1/1]
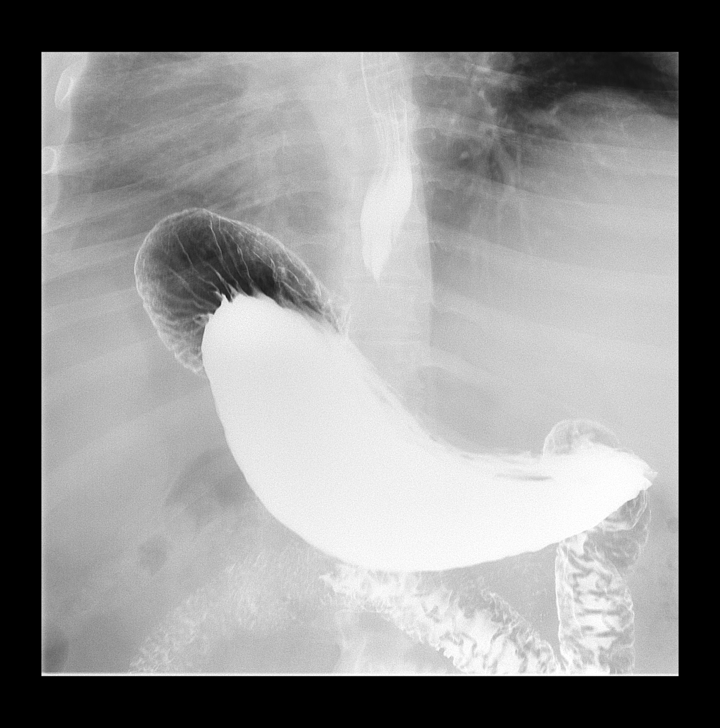

[Series 18: cp_standard · 0.25mm/px · 1 of 1 slices shown (6 of 7)]
[im 1/1]
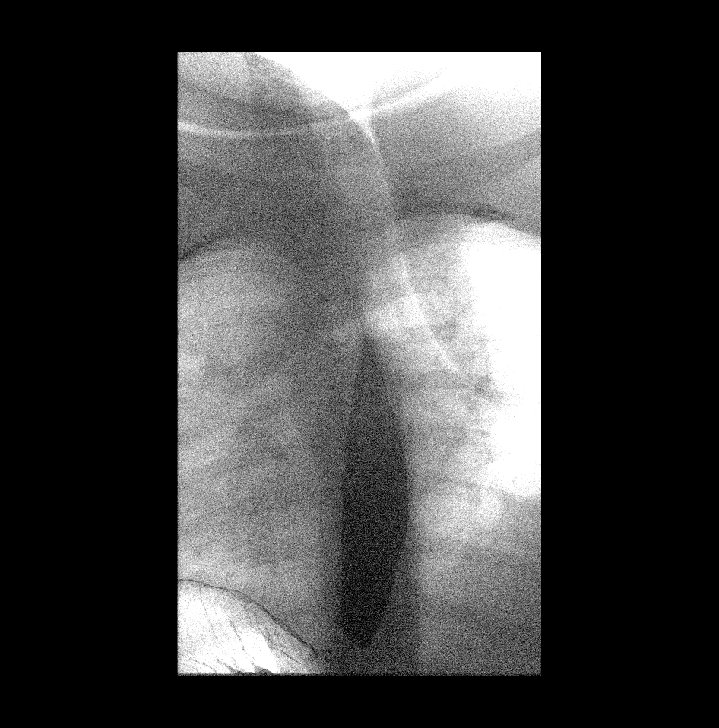

[Series 21: cp_standard · 0.25mm/px · 1 of 1 slices shown (7 of 7)]
[im 1/1]
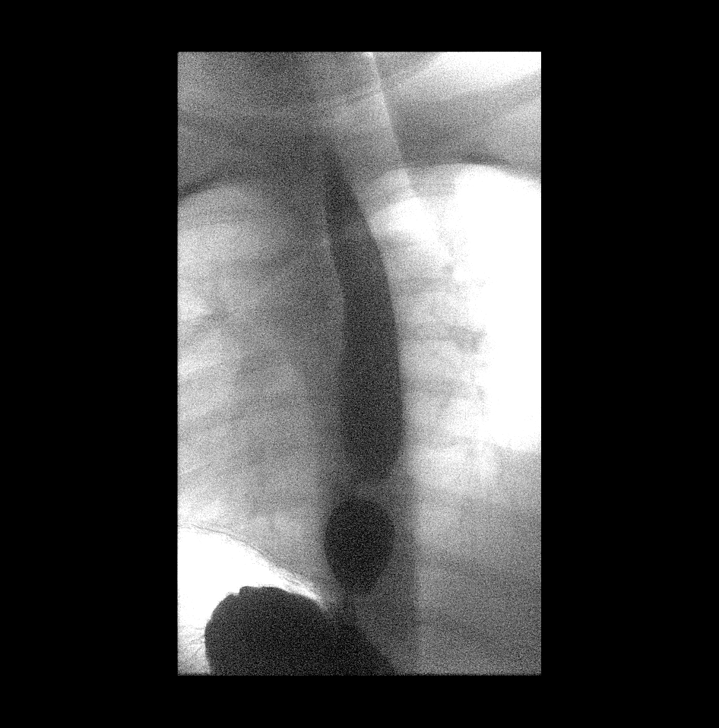

[Series 23: fluoro_barium 2fps_bw · 0.17mm/px · 1 of 1 slices shown (4 of 4)]
[im 1/1]
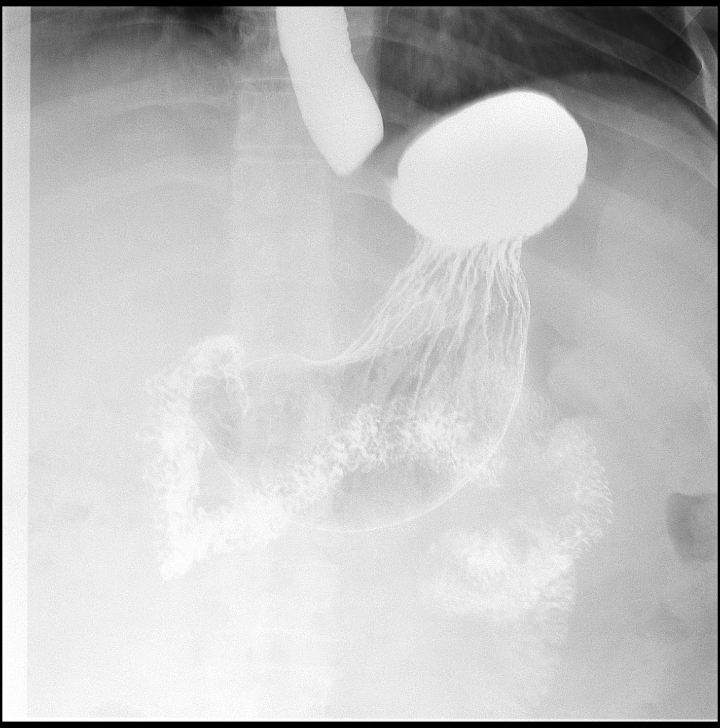

[12 of 24 positions shown; findings below may reference images not displayed]

FINDINGS: KUB:

There is a large amount of stool throughout the colon. There is no
bowel dilatation to suggest obstruction. There is no evidence of
pneumoperitoneum, portal venous gas or pneumatosis.

There are no pathologic calcifications along the expected course of
the ureters.

The osseous structures are unremarkable.

UPPER GI SERIES:

Examination of the esophagus demonstrated normal esophageal
motility. Normal esophageal morphology without evidence of
esophagitis or ulceration. No esophageal stricture, diverticula, or
mass lesion. No evidence of hiatal hernia. Moderate gastroesophageal
reflux.

Examination of the stomach demonstrated normal rugal folds and areae
gastricae. The gastric mucosa appeared unremarkable without evidence
of ulceration, scarring, or mass lesion. Gastric motility and
emptying was normal. Fluoroscopic examination of the duodenum
demonstrates normal motility and morphology without evidence of
ulceration or mass lesion.
IMPRESSION: 1. Moderate gastroesophageal reflux.
2. Normal gastric emptying.

## 2018-12-24 ENCOUNTER — Encounter: Admit: 2018-12-24 | Discharge: 2018-12-24 | Payer: BLUE CROSS/BLUE SHIELD

## 2018-12-24 DIAGNOSIS — L732 Hidradenitis suppurativa: Principal | ICD-10-CM

## 2019-01-19 ENCOUNTER — Encounter: Admit: 2019-01-19 | Discharge: 2019-01-20 | Payer: BLUE CROSS/BLUE SHIELD

## 2019-01-19 DIAGNOSIS — L732 Hidradenitis suppurativa: Secondary | ICD-10-CM

## 2019-01-19 DIAGNOSIS — Z79899 Other long term (current) drug therapy: Secondary | ICD-10-CM

## 2019-01-19 DIAGNOSIS — F411 Generalized anxiety disorder: Principal | ICD-10-CM

## 2019-01-19 DIAGNOSIS — L409 Psoriasis, unspecified: Secondary | ICD-10-CM

## 2019-01-19 MED ORDER — METRONIDAZOLE 500 MG TABLET
ORAL_TABLET | Freq: Three times a day (TID) | ORAL | 2 refills | 0 days | Status: CP
Start: 2019-01-19 — End: 2019-02-18

## 2019-01-19 MED ORDER — CEFDINIR 300 MG CAPSULE
ORAL_CAPSULE | Freq: Two times a day (BID) | ORAL | 2 refills | 0 days | Status: CP
Start: 2019-01-19 — End: 2019-02-18

## 2019-01-19 MED ORDER — DIAZEPAM 10 MG TABLET
ORAL_TABLET | Freq: Once | ORAL | 0 refills | 0 days | Status: CP
Start: 2019-01-19 — End: 2019-01-19

## 2019-02-04 ENCOUNTER — Ambulatory Visit: Admit: 2019-02-04 | Discharge: 2019-02-04 | Payer: BLUE CROSS/BLUE SHIELD

## 2019-02-04 DIAGNOSIS — L732 Hidradenitis suppurativa: Principal | ICD-10-CM

## 2019-02-23 ENCOUNTER — Encounter: Admit: 2019-02-23 | Discharge: 2019-02-24 | Payer: BLUE CROSS/BLUE SHIELD

## 2019-02-23 DIAGNOSIS — L732 Hidradenitis suppurativa: Principal | ICD-10-CM

## 2019-02-23 DIAGNOSIS — Z79899 Other long term (current) drug therapy: Secondary | ICD-10-CM

## 2019-02-23 DIAGNOSIS — L409 Psoriasis, unspecified: Secondary | ICD-10-CM

## 2019-02-23 MED ORDER — HYDROCODONE 5 MG-ACETAMINOPHEN 325 MG TABLET
ORAL_TABLET | 0 refills | 0 days | Status: CP
Start: 2019-02-23 — End: 2019-02-26

## 2019-02-26 MED ORDER — HYDROCODONE 5 MG-ACETAMINOPHEN 325 MG TABLET
ORAL_TABLET | 0 refills | 0 days | Status: CP
Start: 2019-02-26 — End: ?

## 2019-03-03 MED ORDER — LIDOCAINE 5 % TOPICAL OINTMENT
11 refills | 0 days | Status: CP
Start: 2019-03-03 — End: ?

## 2019-03-09 ENCOUNTER — Encounter: Admit: 2019-03-09 | Discharge: 2019-03-10 | Payer: BLUE CROSS/BLUE SHIELD

## 2019-03-09 DIAGNOSIS — L732 Hidradenitis suppurativa: Principal | ICD-10-CM

## 2019-03-18 ENCOUNTER — Ambulatory Visit: Admit: 2019-03-18 | Discharge: 2019-03-19 | Payer: BLUE CROSS/BLUE SHIELD

## 2019-03-18 DIAGNOSIS — L732 Hidradenitis suppurativa: Principal | ICD-10-CM

## 2019-04-22 ENCOUNTER — Encounter
Admit: 2019-04-22 | Discharge: 2019-05-21 | Payer: BLUE CROSS/BLUE SHIELD | Attending: Rehabilitative and Restorative Service Providers" | Primary: Rehabilitative and Restorative Service Providers"

## 2019-04-22 ENCOUNTER — Ambulatory Visit
Admit: 2019-04-22 | Discharge: 2019-05-21 | Payer: BLUE CROSS/BLUE SHIELD | Attending: Rehabilitative and Restorative Service Providers" | Primary: Rehabilitative and Restorative Service Providers"

## 2019-04-22 DIAGNOSIS — R600 Localized edema: Secondary | ICD-10-CM

## 2019-04-29 ENCOUNTER — Ambulatory Visit: Admit: 2019-04-29 | Discharge: 2019-04-30 | Payer: BLUE CROSS/BLUE SHIELD

## 2019-04-29 DIAGNOSIS — L732 Hidradenitis suppurativa: Secondary | ICD-10-CM

## 2019-05-09 IMAGING — US US BREAST*R* LIMITED INC AXILLA
1 series · 7 of 7 positions shown · non-contrast
Comparison: July 2015

ACR Breast Density Category a: The breast tissue is almost entirely
fatty.

CLINICAL DATA: 42-year-old patient with a new palpable lump
associated with the areola of the right breast, 7 o'clock region.
She has had no drainage from the palpable lump and no skin erythema.
She tells me today that she has hidradenitis suppurativa for which
she takes Humira. She wonders if the palpable lump could be related
to her hidradenitis suppurative. She also has chronic skin breakdown
of the inframammary fold regions of both breasts.

[Series 1: us breast*right* limited inc axilla · 0.07mm/px · 7 of 7 slices shown]
[im 1/7]
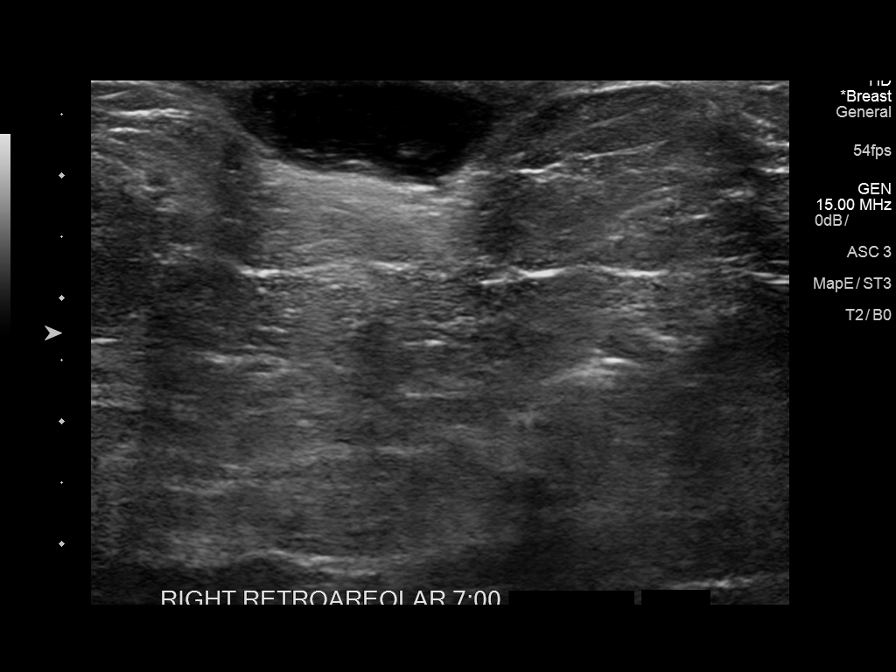
[im 2/7]
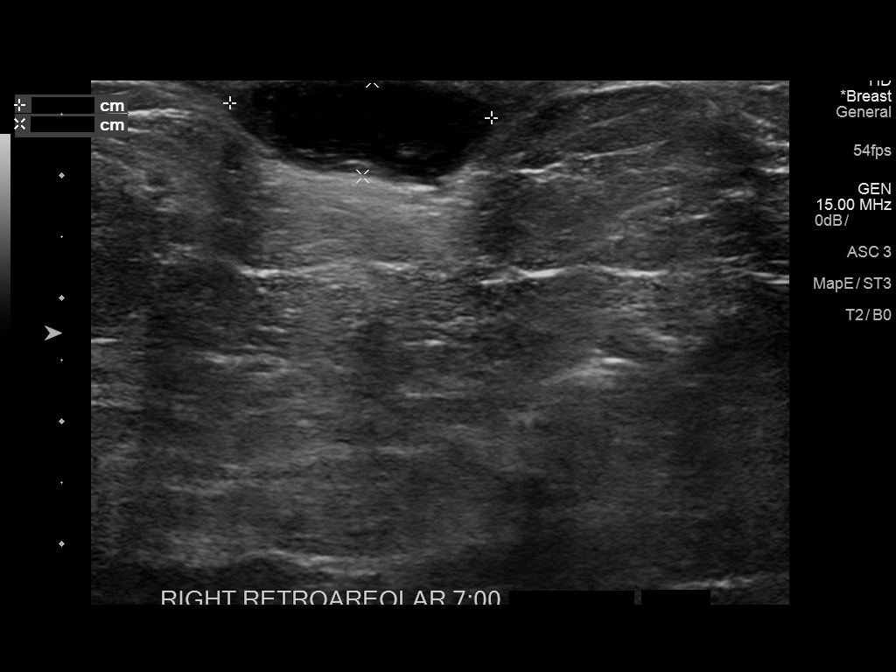
[im 3/7]
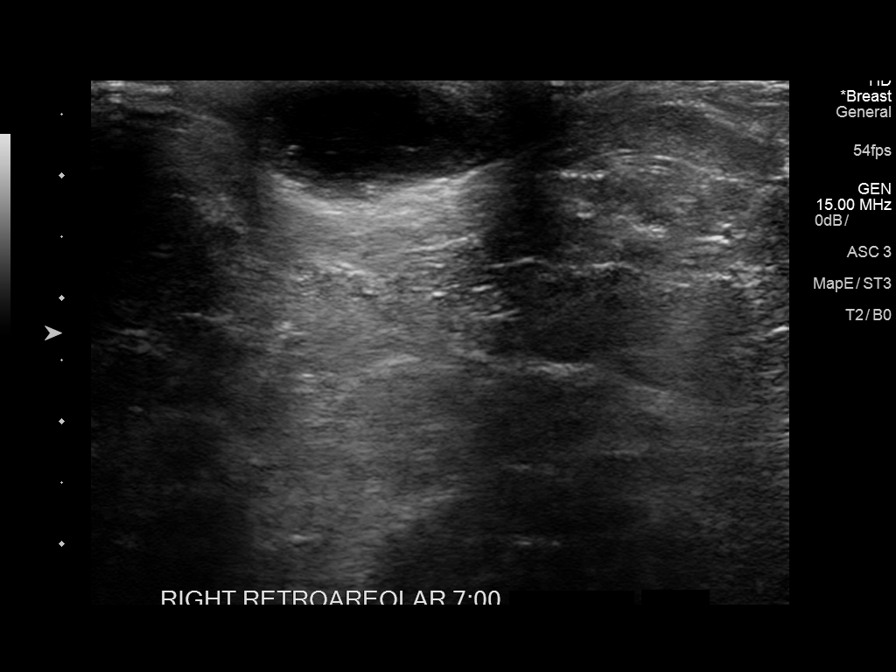
[im 4/7]
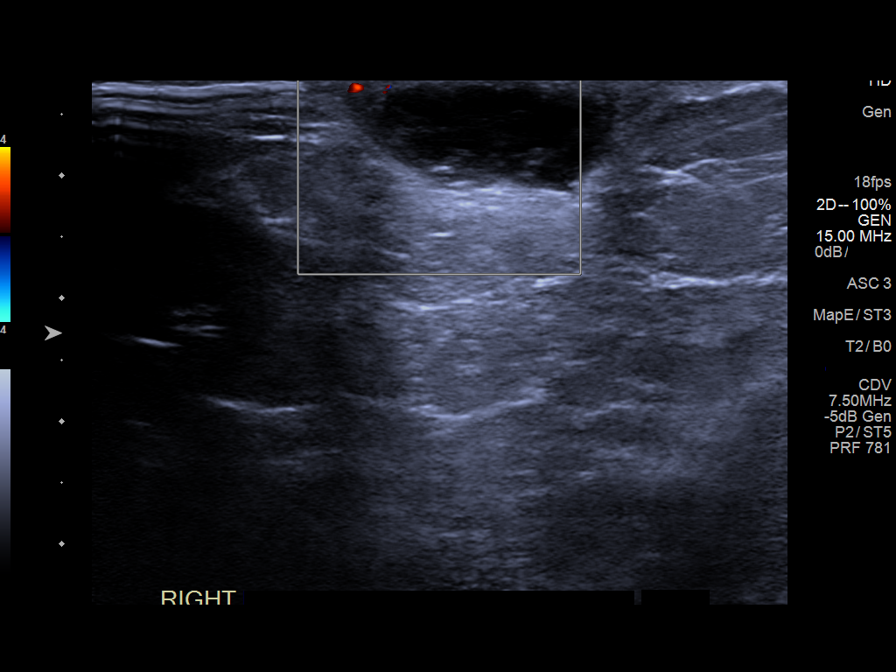
[im 5/7]
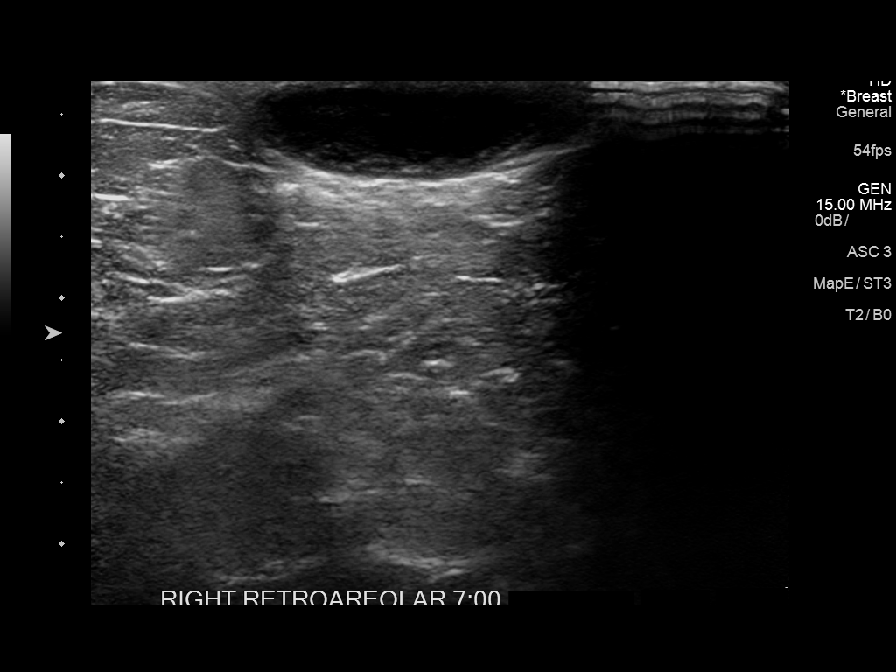
[im 6/7]
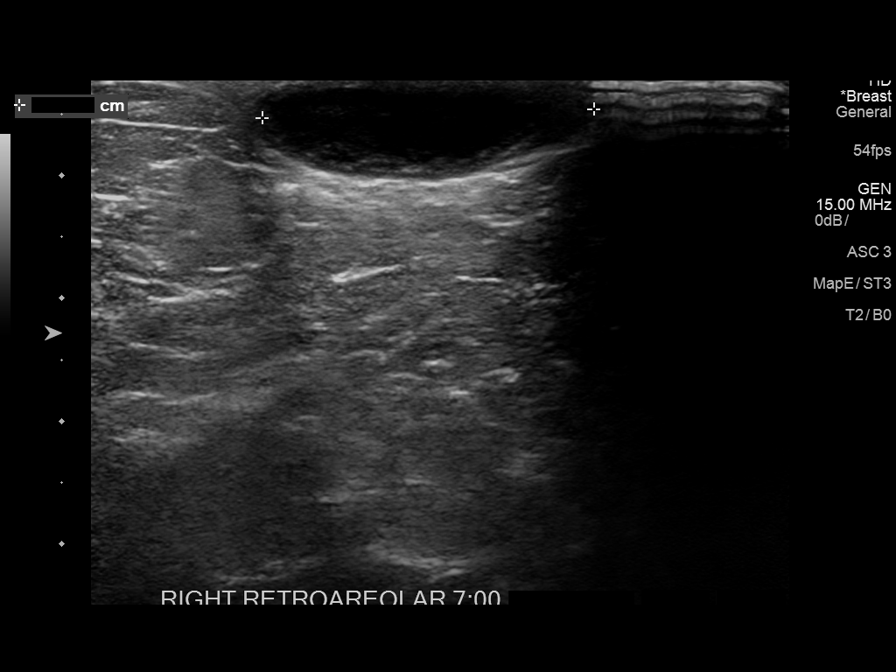
[im 7/7]
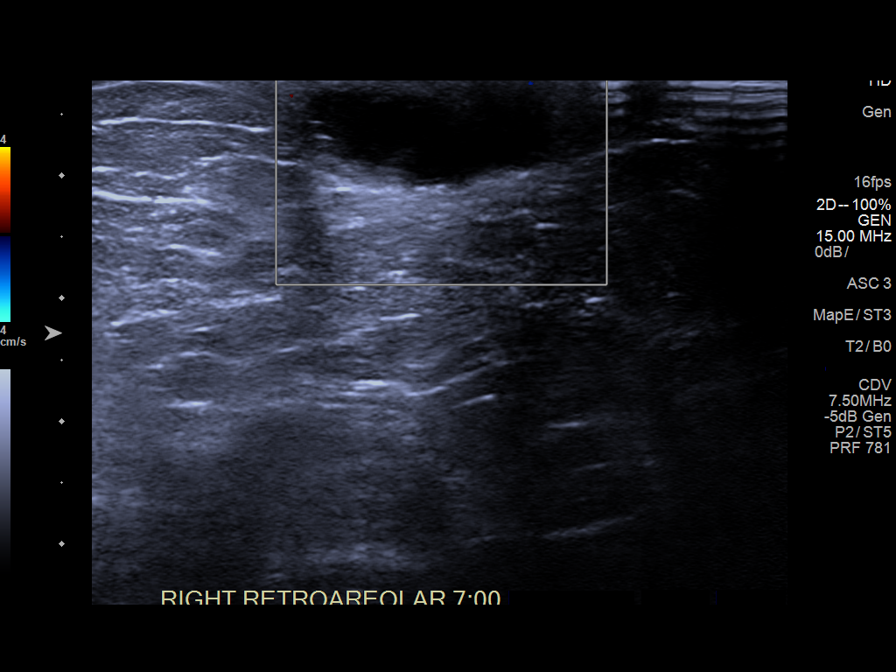

[7 of 7 positions shown; findings below may reference images not displayed]

She was last seen in July 2015 for mammogram and probably benign
findings of fat necrosis were described in the left breast. She was
unable to return for the recommended six-month follow-up.

The patient is in a wheelchair and her imaging was performed with
her sitting in the wheelchair today.

EXAM:
DIGITAL DIAGNOSTIC BILATERAL MAMMOGRAM WITH CAD AND TOMO

ULTRASOUND RIGHT BREAST
FINDINGS: Metallic skin marker was placed in the region of palpable concern of
the right breast. There is apparent thickening of the skin of the
areola, contiguous with a smoothly marginated oval mass in the
superficial retroareolar right breast. The previously described mass
in the medial left breast on the mammogram July 2015 has
resolved, consistent with a typical interval resolution of benign
fat necrosis. No mass is identified in the left breast. No
architectural distortion or suspicious microcalcification is seen in
either breast.

Mammographic images were processed with CAD.

On physical exam, I do palpate a superficial 2.5 cm mass associated
with the areola of the right breast 7 o'clock position. The skin is
intact and appears within normal limits for color. No breast
erythema.

Targeted ultrasound is performed, showing an oval mildly complex
fluid collection associated with the dermis of the areola at 7
o'clock position. The dermal fluid collection measures 2.1 x 0.8 x
2.7 cm. The subtending breast parenchyma is fatty and normal. No
fluid collection or mass is seen in the breast parenchyma. No
vascular flow is seen within the fluid collection, but some flow is
seen along the periphery of the collection.
IMPRESSION: 1. The palpable mass associated with the right areola is a
circumscribed mildly complex fluid collection in the dermis that
measures 2.1 x 0.8 x 2.7 cm. It is likely this is related to the
patient's hidradenitis suppurative?.
2. No evidence of malignancy in either breast.

RECOMMENDATION:
Follow-up with the patient's dermatologist. She has an appointment
scheduled tomorrow January 09, 2018.

Screening mammogram in one year.(Code:R5-D-550)

I have discussed the findings and recommendations with the patient.
Results were also provided in writing at the conclusion of the
visit. If applicable, a reminder letter will be sent to the patient
regarding the next appointment.

BI-RADS CATEGORY  2: Benign.

## 2019-05-14 DIAGNOSIS — R600 Localized edema: Secondary | ICD-10-CM

## 2019-06-08 ENCOUNTER — Ambulatory Visit
Admit: 2019-06-08 | Discharge: 2019-06-20 | Payer: BLUE CROSS/BLUE SHIELD | Attending: Rehabilitative and Restorative Service Providers" | Primary: Rehabilitative and Restorative Service Providers"

## 2019-06-08 ENCOUNTER — Encounter
Admit: 2019-06-08 | Discharge: 2019-06-20 | Payer: BLUE CROSS/BLUE SHIELD | Attending: Rehabilitative and Restorative Service Providers" | Primary: Rehabilitative and Restorative Service Providers"

## 2019-06-23 ENCOUNTER — Encounter: Admit: 2019-06-23 | Discharge: 2019-06-24 | Payer: BLUE CROSS/BLUE SHIELD

## 2019-06-23 DIAGNOSIS — L732 Hidradenitis suppurativa: Principal | ICD-10-CM

## 2019-08-11 ENCOUNTER — Encounter: Admit: 2019-08-11 | Discharge: 2019-08-12 | Payer: BLUE CROSS/BLUE SHIELD

## 2019-08-11 DIAGNOSIS — L732 Hidradenitis suppurativa: Principal | ICD-10-CM

## 2019-08-17 ENCOUNTER — Encounter: Admit: 2019-08-17 | Discharge: 2019-08-18 | Payer: BLUE CROSS/BLUE SHIELD

## 2019-08-17 DIAGNOSIS — L4050 Arthropathic psoriasis, unspecified: Principal | ICD-10-CM

## 2019-08-17 DIAGNOSIS — Z79899 Other long term (current) drug therapy: Principal | ICD-10-CM

## 2019-08-17 DIAGNOSIS — L409 Psoriasis, unspecified: Principal | ICD-10-CM

## 2019-08-17 DIAGNOSIS — L732 Hidradenitis suppurativa: Principal | ICD-10-CM

## 2019-08-17 MED ORDER — CLOBETASOL 0.05 % TOPICAL OINTMENT
Freq: Two times a day (BID) | TOPICAL | 5 refills | 0.00000 days | Status: CP
Start: 2019-08-17 — End: ?

## 2019-08-17 MED ORDER — FOLIC ACID 1 MG TABLET
ORAL_TABLET | Freq: Every day | ORAL | 3 refills | 100.00000 days | Status: CP
Start: 2019-08-17 — End: 2020-08-16

## 2019-08-17 MED ORDER — METHOTREXATE SODIUM 2.5 MG TABLET
ORAL_TABLET | ORAL | 0 refills | 21 days | Status: CP
Start: 2019-08-17 — End: 2019-09-16

## 2019-08-17 MED ORDER — TRIAMCINOLONE ACETONIDE 0.1 % TOPICAL OINTMENT
Freq: Two times a day (BID) | TOPICAL | 4 refills | 0.00000 days | Status: CP
Start: 2019-08-17 — End: 2020-08-16

## 2019-09-14 MED ORDER — DOXYCYCLINE HYCLATE 100 MG CAPSULE
ORAL_CAPSULE | 3 refills | 0 days | Status: CP
Start: 2019-09-14 — End: ?

## 2019-10-13 ENCOUNTER — Ambulatory Visit: Admit: 2019-10-13 | Discharge: 2019-10-14 | Payer: BLUE CROSS/BLUE SHIELD

## 2019-11-06 ENCOUNTER — Encounter: Admit: 2019-11-06 | Discharge: 2019-11-06 | Payer: BLUE CROSS/BLUE SHIELD

## 2019-11-06 DIAGNOSIS — Z79899 Other long term (current) drug therapy: Principal | ICD-10-CM

## 2019-11-06 DIAGNOSIS — L732 Hidradenitis suppurativa: Principal | ICD-10-CM

## 2019-11-24 ENCOUNTER — Encounter: Admit: 2019-11-24 | Discharge: 2019-11-25 | Payer: BLUE CROSS/BLUE SHIELD

## 2020-01-05 ENCOUNTER — Encounter: Admit: 2020-01-05 | Discharge: 2020-01-06 | Payer: BLUE CROSS/BLUE SHIELD

## 2020-02-02 ENCOUNTER — Encounter: Admit: 2020-02-02 | Discharge: 2020-02-03 | Payer: BLUE CROSS/BLUE SHIELD

## 2020-02-02 DIAGNOSIS — L732 Hidradenitis suppurativa: Principal | ICD-10-CM

## 2020-02-02 MED ORDER — DOXYCYCLINE HYCLATE 100 MG CAPSULE
ORAL_CAPSULE | 3 refills | 0 days | Status: CP
Start: 2020-02-02 — End: ?

## 2020-03-07 ENCOUNTER — Ambulatory Visit: Admit: 2020-03-07 | Discharge: 2020-03-08 | Payer: BLUE CROSS/BLUE SHIELD

## 2020-03-07 DIAGNOSIS — Z79899 Other long term (current) drug therapy: Principal | ICD-10-CM

## 2020-03-07 DIAGNOSIS — L304 Erythema intertrigo: Principal | ICD-10-CM

## 2020-03-07 DIAGNOSIS — L732 Hidradenitis suppurativa: Principal | ICD-10-CM

## 2020-03-07 MED ORDER — FINASTERIDE 5 MG TABLET
ORAL_TABLET | Freq: Every day | ORAL | 3 refills | 90.00000 days | Status: CP
Start: 2020-03-07 — End: 2021-03-07

## 2020-03-07 MED ORDER — CLINDAMYCIN HCL 300 MG CAPSULE
ORAL_CAPSULE | Freq: Two times a day (BID) | ORAL | 6 refills | 30.00000 days | Status: CP
Start: 2020-03-07 — End: ?

## 2020-03-07 MED ORDER — FLUCONAZOLE 150 MG TABLET
ORAL_TABLET | Freq: Every day | ORAL | 5 refills | 4.00000 days | Status: CP
Start: 2020-03-07 — End: 2020-04-06

## 2020-03-07 MED ORDER — MIRVASO 0.33 % TOPICAL GEL
TOPICAL | 11 refills | 0.00000 days | Status: CP
Start: 2020-03-07 — End: ?

## 2020-03-17 ENCOUNTER — Ambulatory Visit: Admit: 2020-03-17 | Payer: BLUE CROSS/BLUE SHIELD

## 2020-03-24 ENCOUNTER — Ambulatory Visit: Admit: 2020-03-24 | Discharge: 2020-03-25 | Payer: BLUE CROSS/BLUE SHIELD

## 2020-03-24 DIAGNOSIS — L732 Hidradenitis suppurativa: Principal | ICD-10-CM

## 2020-04-16 DIAGNOSIS — L732 Hidradenitis suppurativa: Principal | ICD-10-CM

## 2020-04-25 ENCOUNTER — Ambulatory Visit: Admit: 2020-04-25 | Discharge: 2020-04-26 | Payer: BLUE CROSS/BLUE SHIELD

## 2020-04-25 MED ORDER — DROSPIRENONE 3 MG-ETHINYL ESTRADIOL 0.02 MG TABLET
ORAL_TABLET | Freq: Every day | ORAL | 3 refills | 84.00000 days | Status: CP
Start: 2020-04-25 — End: 2021-04-25

## 2020-04-26 DIAGNOSIS — L732 Hidradenitis suppurativa: Principal | ICD-10-CM

## 2020-04-26 MED ORDER — HYDROCODONE 5 MG-ACETAMINOPHEN 325 MG TABLET
ORAL_TABLET | ORAL | 0 refills | 0.00000 days | Status: CP
Start: 2020-04-26 — End: ?

## 2020-04-26 MED ORDER — LEVOFLOXACIN 750 MG TABLET
ORAL_TABLET | Freq: Every day | ORAL | 0 refills | 30.00000 days | Status: CP
Start: 2020-04-26 — End: 2020-05-26

## 2020-05-18 DIAGNOSIS — L732 Hidradenitis suppurativa: Principal | ICD-10-CM

## 2020-05-23 ENCOUNTER — Ambulatory Visit: Admit: 2020-05-23 | Discharge: 2020-05-24 | Payer: BLUE CROSS/BLUE SHIELD

## 2020-06-13 ENCOUNTER — Ambulatory Visit: Admit: 2020-06-13 | Discharge: 2020-06-14 | Payer: BLUE CROSS/BLUE SHIELD

## 2020-06-13 DIAGNOSIS — L409 Psoriasis, unspecified: Principal | ICD-10-CM

## 2020-06-13 DIAGNOSIS — Z79899 Other long term (current) drug therapy: Principal | ICD-10-CM

## 2020-06-13 DIAGNOSIS — L719 Rosacea, unspecified: Principal | ICD-10-CM

## 2020-06-13 DIAGNOSIS — L732 Hidradenitis suppurativa: Principal | ICD-10-CM

## 2020-06-13 MED ORDER — METRONIDAZOLE 0.75 % TOPICAL GEL
Freq: Every morning | TOPICAL | 11 refills | 0.00000 days | Status: CP
Start: 2020-06-13 — End: 2020-06-15

## 2020-06-13 MED ORDER — METRONIDAZOLE 0.75 % TOPICAL CREAM
Freq: Every morning | TOPICAL | 11 refills | 0.00000 days | Status: CP
Start: 2020-06-13 — End: 2020-06-13

## 2020-06-14 DIAGNOSIS — L732 Hidradenitis suppurativa: Principal | ICD-10-CM

## 2020-06-15 MED ORDER — METRONIDAZOLE 1 % TOPICAL GEL
Freq: Every day | TOPICAL | 11 refills | 0.00000 days | Status: CP
Start: 2020-06-15 — End: 2020-06-16

## 2020-06-16 MED ORDER — METRONIDAZOLE 1 % TOPICAL GEL
Freq: Every day | TOPICAL | 11 refills | 0.00000 days | Status: CP
Start: 2020-06-16 — End: 2021-06-16

## 2020-06-20 ENCOUNTER — Ambulatory Visit: Admit: 2020-06-20 | Discharge: 2020-06-21 | Payer: BLUE CROSS/BLUE SHIELD

## 2020-06-20 DIAGNOSIS — L732 Hidradenitis suppurativa: Principal | ICD-10-CM

## 2020-07-12 DIAGNOSIS — L732 Hidradenitis suppurativa: Principal | ICD-10-CM

## 2020-07-18 ENCOUNTER — Ambulatory Visit: Admit: 2020-07-18 | Discharge: 2020-07-19 | Payer: BLUE CROSS/BLUE SHIELD

## 2020-07-18 DIAGNOSIS — L732 Hidradenitis suppurativa: Principal | ICD-10-CM

## 2020-07-31 MED ORDER — HYDROCODONE 5 MG-ACETAMINOPHEN 325 MG TABLET
ORAL_TABLET | ORAL | 0 refills | 0.00000 days | Status: CP
Start: 2020-07-31 — End: 2020-08-02

## 2020-07-31 MED ORDER — AMOXICILLIN 875 MG-POTASSIUM CLAVULANATE 125 MG TABLET
ORAL_TABLET | 2 refills | 0.00000 days | Status: CP
Start: 2020-07-31 — End: ?

## 2020-08-02 DIAGNOSIS — L732 Hidradenitis suppurativa: Principal | ICD-10-CM

## 2020-08-02 MED ORDER — HYDROCODONE 5 MG-ACETAMINOPHEN 325 MG TABLET
ORAL_TABLET | ORAL | 0 refills | 0.00000 days | Status: CP
Start: 2020-08-02 — End: ?

## 2020-08-12 DIAGNOSIS — L732 Hidradenitis suppurativa: Principal | ICD-10-CM

## 2020-08-15 ENCOUNTER — Ambulatory Visit: Admit: 2020-08-15 | Discharge: 2020-08-16 | Payer: BLUE CROSS/BLUE SHIELD

## 2020-08-15 DIAGNOSIS — L732 Hidradenitis suppurativa: Principal | ICD-10-CM

## 2020-08-15 DIAGNOSIS — Z5112 Encounter for antineoplastic immunotherapy: Principal | ICD-10-CM

## 2020-09-09 DIAGNOSIS — L732 Hidradenitis suppurativa: Principal | ICD-10-CM

## 2020-09-12 ENCOUNTER — Ambulatory Visit: Admit: 2020-09-12 | Discharge: 2020-09-13 | Payer: BLUE CROSS/BLUE SHIELD

## 2020-09-12 DIAGNOSIS — Z79899 Other long term (current) drug therapy: Principal | ICD-10-CM

## 2020-09-12 DIAGNOSIS — L732 Hidradenitis suppurativa: Principal | ICD-10-CM

## 2020-09-14 DIAGNOSIS — L304 Erythema intertrigo: Principal | ICD-10-CM

## 2020-09-14 DIAGNOSIS — K2270 Barrett's esophagus without dysplasia: Principal | ICD-10-CM

## 2020-09-14 DIAGNOSIS — R197 Diarrhea, unspecified: Principal | ICD-10-CM

## 2020-09-20 DIAGNOSIS — L304 Erythema intertrigo: Principal | ICD-10-CM

## 2020-09-20 MED ORDER — FLUCONAZOLE 150 MG TABLET
ORAL_TABLET | 5 refills | 0.00000 days | Status: CP
Start: 2020-09-20 — End: ?

## 2020-09-21 DIAGNOSIS — Z124 Encounter for screening for malignant neoplasm of cervix: Principal | ICD-10-CM

## 2020-09-29 DIAGNOSIS — Z1231 Encounter for screening mammogram for malignant neoplasm of breast: Principal | ICD-10-CM

## 2020-10-04 ENCOUNTER — Ambulatory Visit: Admit: 2020-10-04 | Discharge: 2020-10-05 | Payer: BLUE CROSS/BLUE SHIELD

## 2020-10-04 DIAGNOSIS — E139 Other specified diabetes mellitus without complications: Principal | ICD-10-CM

## 2020-10-05 DIAGNOSIS — L732 Hidradenitis suppurativa: Principal | ICD-10-CM

## 2020-10-10 ENCOUNTER — Ambulatory Visit: Admit: 2020-10-10 | Discharge: 2020-10-11 | Payer: BLUE CROSS/BLUE SHIELD

## 2020-10-10 DIAGNOSIS — L732 Hidradenitis suppurativa: Principal | ICD-10-CM

## 2020-10-10 DIAGNOSIS — L409 Psoriasis, unspecified: Principal | ICD-10-CM

## 2020-10-10 MED ORDER — SECUKINUMAB 150 MG/ML SUBCUTANEOUS PEN INJECTOR
SUBCUTANEOUS | 3 refills | 28.00000 days | Status: CP
Start: 2020-10-10 — End: ?
  Filled 2020-11-08: qty 10, 49d supply, fill #0

## 2020-10-11 ENCOUNTER — Ambulatory Visit: Admit: 2020-10-11 | Discharge: 2020-10-12 | Payer: BLUE CROSS/BLUE SHIELD

## 2020-10-11 DIAGNOSIS — L732 Hidradenitis suppurativa: Principal | ICD-10-CM

## 2020-10-11 DIAGNOSIS — L409 Psoriasis, unspecified: Principal | ICD-10-CM

## 2020-10-20 NOTE — Unmapped (Signed)
[facility] Specialty Medication Onboarding    Specialty Medication: Cosentyx loading and maintenance  Prior Authorization: Approved   Financial Assistance: No - copay  <$25  Final Copay/Day Supply: $3 / 35 days (load) , $3/ 28 days    Insurance Restrictions: None     Notes to Pharmacist:     The triage team has completed the benefits investigation and has determined that the patient is able to fill this medication at [facility]. Please contact the patient to complete the onboarding or follow up with the prescribing physician as needed.

## 2020-10-24 NOTE — Unmapped (Signed)
[facility]   Patient Onboarding/Medication Counseling    [name] is a 45 y.o. female with hidradenitis suppurativa and psoriasis who I am counseling today on initiation of therapy.  I am speaking to the patient.    Was a Nurse, learning disability used for this call? No    Verified patient's date of birth / HIPAA.    Specialty medication(s) to be sent: Inflammatory Disorders: Cosentyx      Non-specialty medications/supplies to be sent: sharps kit      Medications not needed at this time: na         Cosentyx (secukinumab)    Medication & Administration     Dosage: Off-label dosing: inject the contents of 2 pen (300 mg) under the skin for 5 weeks, then inject 300 mg every 14 days thereafter      Lab tests required prior to treatment initiation:  ??? Tuberculosis: Tuberculosis screening resulted in a non-reactive Quantiferon TB Gold assay.      Administration:       Prefilled Sensoready?? auto-injector pen  1. Gather all supplies needed for injection on a clean, flat working surface: medication pen removed from packaging, alcohol swab, sharps container, etc.  2. Look at the medication label ??? look for correct medication, correct dose, and check the expiration date  3. Look at the medication ??? the liquid visible in the window on the side of the pen device should appear clear and colorless to slightly yellow  4. Lay the auto-injector pen on a flat surface and allow it to warm up to room temperature for at least 15-30 minutes  5. Select injection site ??? you can use the front of your thigh or your belly (but not the area 2 inches around your belly button); if someone else is giving you the injection you can also use your upper arm in the skin covering your triceps muscle  6. Prepare injection site ??? wash your hands and clean the skin at the injection site with an alcohol swab and let it air dry, do not touch the injection site again before the injection  7. Twist off the purple safety cap in the direction of the arrow, do not remove until immediately prior to injection and do not touch the yellow needle cover  8. Put the white needle cover against your skin at the injection site at a 90 degree angle, hold the pen such that you can see the clear medication window  9. Press down and hold the pen firmly against your skin, there will be a click when the injection starts  10. Continue to hold the pen firmly against your skin for about 10-15 seconds ??? the window will start to turn solid green  11. There will be a second click sound when the injection is almost complete, verify the window is solid green to indicate the injection is complete and then pull the pen away from your skin  12. Dispose of the used auto-injector pen immediately in your sharps disposal container the needle will be covered automatically  13. If you see any blood at the injection site, press a cotton ball or gauze on the site and maintain pressure until the bleeding stops, do not rub the injection site      Adherence/Missed dose instructions:  If your injection is given more than 4 days after your scheduled injection date ??? consult your pharmacist for additional instructions on how to adjust your dosing schedule.  Goals of Therapy       Plaque Psoriasis/ HS  ??? Minimize areas of skin involvement (% BSA)  ??? Avoidance of long term glucocorticoid use  ??? Maintenance of effective psychosocial functioning      Side Effects & Monitoring Parameters     ??? Injection site reaction (redness, irritation, inflammation localized to the site of administration)  ??? Signs of a common cold ??? minor sore throat, runny or stuffy nose, etc.  ??? Diarrhea    The following side effects should be reported to the provider:  ??? Signs of a hypersensitivity reaction ??? rash; hives; itching; red, swollen, blistered, or peeling skin; wheezing; tightness in the chest or throat; difficulty breathing, swallowing, or talking; swelling of the mouth, face, lips, tongue, or throat; etc.  ??? Reduced immune function ??? report signs of infection such as fever; chills; body aches; very bad sore throat; ear or sinus pain; cough; more sputum or change in color of sputum; pain with passing urine; wound that will not heal, etc.  Also at a slightly higher risk of some malignancies (mainly skin and blood cancers) due to this reduced immune function.  o In the case of signs of infection ??? the patient should hold the next dose of Cosentyx?? and call your primary care provider to ensure adequate medical care.  Treatment may be resumed when infection is treated and patient is asymptomatic.  ??? Muscle pain or weakness  ??? Shortness of breath      Warnings, Precautions, & Contraindications     ??? Have your bloodwork checked as you have been told by your prescriber  ??? Talk with your doctor if you are pregnant, planning to become pregnant, or breastfeeding  ??? Discuss the possible need for holding your dose(s) of Cosentyx?? when a planned procedure is scheduled with the prescriber as it may delay healing/recovery timeline       Drug/Food Interactions     ??? Medication list reviewed in Epic. The patient was instructed to inform the care team before taking any new medications or supplements. No drug interactions identified.   ??? If you have a latex allergy use caution when handling, the needle cap of the Cosentyx?? prefilled syringe and the safety cap for the Cosentyx Sensoready?? pen contains a derivative of natural rubber latex  ??? Talk with you prescriber or pharmacist before receiving any live vaccinations while taking this medication and after you stop taking it      Storage, Handling Precautions, & Disposal     ??? Store this medication in the refrigerator.  Do not freeze  ??? If needed, you may store at room temperature for up to 1 hour  ??? Store in original packaging, protected from light  ??? Do not shake  ??? Dispose of used syringes/pens in a sharps disposal container           Current Medications (including OTC/herbals), Comorbidities and Allergies     Current Outpatient Medications   Medication Sig Dispense Refill   ??? albiglutide 50 mg/0.5 mL PnIj Inject 50 mg under the skin.     ??? alpha lipoic acid 200 mg cap Take 200 mg by mouth.     ??? amoxicillin-clavulanate (AUGMENTIN) 875-125 mg per tablet Take twice daily 60 tablet 2   ??? ascorbic Acid (VITAMIN C) 500 mg CpER Take 500 mg by mouth.     ??? aspirin (ECOTRIN) 81 MG tablet Take 81 mg by mouth daily.     ??? atorvastatin (LIPITOR) 20  MG tablet Take 20 mg by mouth daily.     ??? BD INSULIN SYRINGE ULTRA-FINE 1 mL 31 gauge x 5/16 (8 mm) Syrg USE 2 (TWO) TIMES DAILY AS DIRECTED     ??? blood sugar diagnostic (GLUCOSE BLOOD) Strp Frequency:PHARMDIR   Dosage:0.0     Instructions:  Note:Use to test blood sugar in the morning before breakfast and in the evening two hours after dinner, ISD-9 250.00. Dose: 1     ??? blood-glucose meter (ACCU-CHEK AVIVA PLUS METER) Misc U QID UTD     ??? brimonidine (MIRVASO) 0.33 % Gel Apply every 12 hours to reduce redness 30 g 11   ??? cetirizine (ZYRTEC) 10 MG tablet Take 10 mg by mouth daily.     ??? cholecalciferol, vitamin D3, 1,000 unit (25 mcg) tablet Take 1,000 Units by mouth.     ??? clindamycin (CLEOCIN) 300 MG capsule Take 1 capsule (300 mg total) by mouth two (2) times a day. 60 capsule 6   ??? clobetasoL (TEMOVATE) 0.05 % ointment Apply topically Two (2) times a day. 60 g 5   ??? drospirenone-ethinyl estradioL (YAZ) 3-0.02 mg per tablet Take 1 tablet by mouth daily. 84 tablet 3   ??? empagliflozin 25 mg Tab Take 25 mg by mouth.     ??? finasteride (PROSCAR) 5 mg tablet Take 1 tablet (5 mg total) by mouth daily. 90 tablet 3   ??? fluconazole (DIFLUCAN) 150 MG tablet TAKE 1 TAB NOW AN THEN TAKE 1 TABLET ONCE WEEKLY AS NEEDED FOR YEAST INFECTION 4 tablet 5   ??? furosemide (LASIX) 40 MG tablet Take 40 mg by mouth daily.      ??? gabapentin (NEURONTIN) 300 MG capsule Take 1 capsule (300 mg total) by mouth Two (2) times a day. 60 capsule 0   ??? HUMULIN R U-500, CONC, INSULIN 500 unit/mL CONCENTRATED injection INJECT 58 UNITS SUBCUTANEOUSLY ONCE DAILY TO BE USED IN INSULIN PUMP.  5   ??? HYDROcodone-acetaminophen (NORCO) 5-325 mg per tablet Take 1-2 tablets every 6 hours as needed for pain 15 tablet 0   ??? hydrocortisone-iodoquinL-aloe2 (ALCORTIN A) 2-1-1 % Gel Apply topically.      ??? INSULIN PUMP CONTROLLER MISC by Miscellaneous route.     ??? KLOR-CON M20 20 mEq tablet TAKE 1 TABLET (20 MEQ TOTAL) BY MOUTH ONCE DAILY  1   ??? levothyroxine (SYNTHROID, LEVOTHROID) 50 MCG tablet TAKE 1 TABLET DAILY ON AN EMPTY STOMACH WITH A GLASS OF WATER 30-60 MINUTES BEFORE BREAKFAST.  0   ??? lidocaine (XYLOCAINE) 5 % ointment Apply to painful areas every 3-4 hours as needed 60 g 11   ??? lisinopril (PRINIVIL,ZESTRIL) 10 MG tablet Take 10 mg by mouth daily.     ??? meloxicam (MOBIC) 15 MG tablet Take 15 mg by mouth daily.  1   ??? metFORMIN (GLUCOPHAGE) 500 MG tablet Take 1,000 mg by mouth Two (2) times a day.     ??? metroNIDAZOLE (METROGEL) 1 % gel Apply topically daily. Requires name brand for Medicaid 60 g 11   ??? multivitamin capsule Take 1 capsule by mouth daily.     ??? pantoprazole (PROTONIX) 40 MG tablet Take 40 mg by mouth.     ??? pen needle, diabetic 31 gauge x 5/16 Ndle Use 1 needle to skin four times daily as directed     ??? secukinumab (COSENTYX) 150 mg/mL PnIj injection Inject the contents of 2 pens (300 mg total) under the skin once a week for the first 5  weeks as loading doses 10 mL 0   ??? secukinumab (COSENTYX) 150 mg/mL PnIj injection Inject the contents of 2 pens (300 mg total) under the skin every fourteen (14) days as maintenance doses.  START 14 days after last loading dose. 4 mL 3   ??? SUBCUTANEOUS INSULIN PUMP MISC by Miscellaneous route.       No current facility-administered medications for this visit.       Allergies   Allergen Reactions   ??? [employer] Other (See Comments)   ??? Lamictal [Lamotrigine] Rash   ??? Oxycodone Nausea And Vomiting       Patient Active Problem List   Diagnosis   ??? Hypertension, benign   ??? Type II diabetes mellitus, uncontrolled (CMS-HCC)   ??? Hidradenitis   ??? Morbid obesity (CMS-HCC)   ??? Arthritis with psoriasis (CMS-HCC)   ??? Cellulitis and abscess of skin of abdomen   ??? 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    Appropriateness of Therapy     Is medication and dose appropriate based on diagnosis? No - evidence provided by prescriber in 3/7 note - dual diagnoses - higher dosing for treatment of HS.     Prescription has been clinically reviewed: Yes    Baseline Quality of Life Assessment      How many days over the past month did your HS/psoriasis  keep you from your normal activities? For example, brushing your teeth or getting up in the morning. Patient declined to answer    Financial Information     Medication Assistance provided: Prior Authorization    Anticipated copay of $3 reviewed with patient. Verified delivery address.    Delivery Information     Scheduled delivery date: Tues, 4/5    Expected start date: Tues  4/5    Medication will be delivered via Same Day Courier to the prescription address in [facility].  This shipment will not require a signature.      Explained the services we provide at [facility] and that each month we would call to set up refills.  Stressed importance of returning phone calls so that we could ensure they receive their medications in time each month.  Informed patient that we should be setting up refills 7-10 days prior to when they will run out of medication.  A pharmacist will reach out to perform a clinical assessment periodically.  Informed patient that a welcome packet, containing information about our pharmacy and other support services, a Notice of Privacy Practices, and a drug information handout will be sent.      Patient verbalized understanding of the above information as well as how to contact the pharmacy at [phone] option 4 with any questions/concerns.  The pharmacy is open Monday through Friday 8:30am-4:30pm.  A pharmacist is available 24/7 via pager to answer any clinical questions they may have.    Patient Specific Needs     - Does the patient have any physical, cognitive, or cultural barriers? No    - Patient prefers to have medications discussed with  Patient     - Is the patient or caregiver able to read and understand education materials at a high school level or above? Yes    - Patient's primary language is  English     - Is the patient high risk? No    - Does the patient require a Care Management Plan? No     -  Does the patient require physician intervention or other additional services (i.e. nutrition, smoking cessation, social work)? No      Debra Colon A Desiree Lucy [facility] Specialty Pharmacist

## 2020-10-28 DIAGNOSIS — L732 Hidradenitis suppurativa: Principal | ICD-10-CM

## 2020-10-28 MED ORDER — CLINDAMYCIN HCL 300 MG CAPSULE
ORAL_CAPSULE | Freq: Two times a day (BID) | ORAL | 6 refills | 30.00000 days
Start: 2020-10-28 — End: ?

## 2020-11-01 MED ORDER — CLINDAMYCIN HCL 300 MG CAPSULE
ORAL_CAPSULE | Freq: Two times a day (BID) | ORAL | 6 refills | 30.00000 days | Status: CP
Start: 2020-11-01 — End: ?

## 2020-11-07 ENCOUNTER — Ambulatory Visit: Admit: 2020-11-07 | Discharge: 2020-11-08 | Payer: BLUE CROSS/BLUE SHIELD

## 2020-11-07 DIAGNOSIS — L732 Hidradenitis suppurativa: Principal | ICD-10-CM

## 2020-11-07 MED ADMIN — acetaminophen (TYLENOL) tablet 650 mg: 650 mg | ORAL | @ 17:00:00 | Stop: 2020-11-07

## 2020-11-07 MED ADMIN — inFLIXimab-abda (RENFLEXIS) 10 mg/kg = 1,700 mg in sodium chloride (NS) 500 mL IVPB: 10 mg/kg | INTRAVENOUS | @ 18:00:00 | Stop: 2020-11-07

## 2020-11-07 MED ADMIN — cetirizine (ZyrTEC) tablet 10 mg: 10 mg | ORAL | @ 17:00:00 | Stop: 2020-11-07

## 2020-11-07 MED ADMIN — ondansetron (ZOFRAN-ODT) disintegrating tablet 8 mg: 8 mg | ORAL | @ 17:00:00 | Stop: 2020-11-07

## 2020-11-07 NOTE — Unmapped (Signed)
Pt in for infusion. Pt denies any S\s of infection. Pt call light with in reach. Pt teaching regarding S\s of adverse reaction and how to use call light to summon staff if experiencing any S\s during infusion. Pt verbalizing understanding. Right chest porta cath accessed using [place] health protocol and sterile technique. Brisk blood return noted and flushed easily with 10 ml NS. Pt tolerated procedure well.   pre meds given easily  @ 1349 pm  REnflexis 1700mg  in 566ml@20ml /hrX49min then to infuse as follows  @40ml /hrX2min  @80ml /hrX7min  @160ml /hrX28min  @300ml /hrX40min  @500ml /hr until the infusion is complete.   ??  IV infusing. Pt denies any complaints.  ??  @1450  pm  Renflexis infusion complete. Pt denies any complaints.  Right chest porta cath de accessed using [facility] health protocol. Brisk blood return noted,flushed with 10 ml NS followed by  5 ml Heparin 100units per ml. Pt tolerated procedure well.   VSS no S\s of adverse reaction. Pt dcd home. Marland Kitchen

## 2020-11-08 MED ORDER — EMPTY CONTAINER
2 refills | 0 days
Start: 2020-11-08 — End: ?

## 2020-11-08 MED FILL — EMPTY CONTAINER: 120 days supply | Qty: 1 | Fill #0

## 2020-11-25 MED ORDER — TRULICITY 0.75 MG/0.5 ML SUBCUTANEOUS PEN INJECTOR
0.00000 days
Start: 2020-11-25 — End: ?

## 2020-12-06 ENCOUNTER — Ambulatory Visit: Admit: 2020-12-06 | Discharge: 2020-12-06 | Payer: BLUE CROSS/BLUE SHIELD

## 2020-12-06 DIAGNOSIS — L732 Hidradenitis suppurativa: Principal | ICD-10-CM

## 2020-12-06 LAB — CREATININE
CREATININE: 0.75 mg/dL
EGFR CKD-EPI AA FEMALE: >90 mL/min/{1.73_m2} (ref >=60)
EGFR CKD-EPI NON-AA FEMALE: >90 mL/min/{1.73_m2} (ref >=60)

## 2020-12-06 LAB — CBC W/ AUTO DIFF
BASOPHILS ABSOLUTE COUNT: 0.1 10*9/L (ref 0.0–0.1)
BASOPHILS RELATIVE PERCENT: 1.2 %
EOSINOPHILS ABSOLUTE COUNT: 0.5 10*9/L (ref 0.0–0.5)
EOSINOPHILS RELATIVE PERCENT: 7.1 %
HEMATOCRIT: 36.7 % (ref 34.0–44.0)
HEMOGLOBIN: 12.9 g/dL (ref 11.3–14.9)
LYMPHOCYTES ABSOLUTE COUNT: 1.8 10*9/L (ref 1.1–3.6)
LYMPHOCYTES RELATIVE PERCENT: 25.5 %
MEAN CORPUSCULAR HEMOGLOBIN CONC: 35 g/dL (ref 32.0–36.0)
MEAN CORPUSCULAR HEMOGLOBIN: 31.3 pg (ref 25.9–32.4)
MEAN CORPUSCULAR VOLUME: 89.4 fL (ref 77.6–95.7)
MEAN PLATELET VOLUME: 9.4 fL (ref 6.8–10.7)
MONOCYTES ABSOLUTE COUNT: 0.5 10*9/L (ref 0.3–0.8)
MONOCYTES RELATIVE PERCENT: 6.6 %
NEUTROPHILS ABSOLUTE COUNT: 4.3 10*9/L (ref 1.8–7.8)
NEUTROPHILS RELATIVE PERCENT: 59.6 %
PLATELET COUNT: 273 10*9/L (ref 150–450)
RED BLOOD CELL COUNT: 4.11 10*12/L (ref 3.95–5.13)
RED CELL DISTRIBUTION WIDTH: 13.7 % (ref 12.2–15.2)
WBC ADJUSTED: 7.2 10*9/L (ref 3.6–11.2)

## 2020-12-06 LAB — ALT: ALT (SGPT): 28 U/L (ref 10–49)

## 2020-12-06 LAB — AST: AST (SGOT): 18 U/L (ref <=34)

## 2020-12-06 LAB — BUN: BLOOD UREA NITROGEN: 23 mg/dL (ref 9–23)

## 2020-12-06 LAB — SEDIMENTATION RATE: ERYTHROCYTE SEDIMENTATION RATE: 38 mm/h — ABNORMAL HIGH (ref 0–20)

## 2020-12-06 LAB — C-REACTIVE PROTEIN: C-REACTIVE PROTEIN: 21 mg/L — ABNORMAL HIGH (ref <=10.0)

## 2020-12-06 MED ADMIN — ondansetron (ZOFRAN-ODT) disintegrating tablet 8 mg: 8 mg | ORAL | @ 18:00:00 | Stop: 2020-12-06

## 2020-12-06 MED ADMIN — acetaminophen (TYLENOL) tablet 650 mg: 650 mg | ORAL | @ 18:00:00 | Stop: 2020-12-06

## 2020-12-06 MED ADMIN — cetirizine (ZyrTEC) tablet 10 mg: 10 mg | ORAL | @ 18:00:00 | Stop: 2020-12-06

## 2020-12-06 MED ADMIN — inFLIXimab-abda (RENFLEXIS) 10 mg/kg = 1,700 mg in sodium chloride (NS) 500 mL IVPB: 10 mg/kg | INTRAVENOUS | @ 18:00:00 | Stop: 2020-12-06

## 2020-12-06 NOTE — Unmapped (Signed)
1330* Patient presents for accelerated Renflexis (infliximab-abda) infusion.  In no acute distress.  Vitals stable.  Reports no new medical issues or S/S of infection.  IV started.  See MAR for premeds.    1414* Renflexis (infliximab-abda) 1700 mg started to infuse as follows:     200 ml/hr x 15 min then 600 ml/hr for remainder of infusion.    1518 Renflexis (infliximab-abda) infusion complete.  PIV flushed with NS.  Vitals stable.  Patient without any s/s of adverse reaction.    1530 IV d/c'd.  Patient discharged from Infusion Center.

## 2020-12-06 NOTE — Unmapped (Signed)
Summer Russell took her first dose of Cosentyx last week. She had been having GI upset, and didn't want the Cosentyx to exacerbate. Her first dose went well, but the pens were hard to inject. We discussed she could firm up her belly skin by pinching an area around it vs. Inject into her thighs.     She had no adverse effects with first dose. We reviewed dosing of weekly injections x 5 weeks, then moving to every 14 days thereafter starting on June 8.       [facility] Clinical Assessment & Refill Coordination Note    Summer Russell, DOB: [birth year]  Phone: [phone] (home)     All above HIPAA information was verified with patient.     Was a Nurse, learning disability used for this call? No    Specialty Medication(s):   Inflammatory Disorders: Cosentyx     Current Outpatient Medications   Medication Sig Dispense Refill   ??? albiglutide 50 mg/0.5 mL PnIj Inject 50 mg under the skin.     ??? alpha lipoic acid 200 mg cap Take 200 mg by mouth.     ??? amoxicillin-clavulanate (AUGMENTIN) 875-125 mg per tablet Take twice daily 60 tablet 2   ??? ascorbic Acid (VITAMIN C) 500 mg CpER Take 500 mg by mouth.     ??? aspirin (ECOTRIN) 81 MG tablet Take 81 mg by mouth daily.     ??? atorvastatin (LIPITOR) 20 MG tablet Take 20 mg by mouth daily.     ??? BD INSULIN SYRINGE ULTRA-FINE 1 mL 31 gauge x 5/16 (8 mm) Syrg USE 2 (TWO) TIMES DAILY AS DIRECTED     ??? blood sugar diagnostic (GLUCOSE BLOOD) Strp Frequency:PHARMDIR   Dosage:0.0     Instructions:  Note:Use to test blood sugar in the morning before breakfast and in the evening two hours after dinner, ISD-9 250.00. Dose: 1     ??? blood-glucose meter (ACCU-CHEK AVIVA PLUS METER) Misc U QID UTD     ??? brimonidine (MIRVASO) 0.33 % Gel Apply every 12 hours to reduce redness 30 g 11   ??? cetirizine (ZYRTEC) 10 MG tablet Take 10 mg by mouth daily.     ??? cholecalciferol, vitamin D3, 1,000 unit (25 mcg) tablet Take 1,000 Units by mouth.     ??? clindamycin (CLEOCIN) 300 MG capsule TAKE 1 CAPSULE (300 MG TOTAL) BY MOUTH TWO (2) TIMES A DAY. 60 capsule 6   ??? clobetasoL (TEMOVATE) 0.05 % ointment Apply topically Two (2) times a day. 60 g 5   ??? drospirenone-ethinyl estradioL (YAZ) 3-0.02 mg per tablet Take 1 tablet by mouth daily. 84 tablet 3   ??? empagliflozin 25 mg Tab Take 25 mg by mouth.     ??? empty container Misc Use as directed to dispose of Cosentyx pens. 1 each 2   ??? finasteride (PROSCAR) 5 mg tablet Take 1 tablet (5 mg total) by mouth daily. 90 tablet 3   ??? fluconazole (DIFLUCAN) 150 MG tablet TAKE 1 TAB NOW AN THEN TAKE 1 TABLET ONCE WEEKLY AS NEEDED FOR YEAST INFECTION 4 tablet 5   ??? furosemide (LASIX) 40 MG tablet Take 40 mg by mouth daily.      ??? gabapentin (NEURONTIN) 300 MG capsule Take 1 capsule (300 mg total) by mouth Two (2) times a day. 60 capsule 0   ??? HUMULIN R U-500, CONC, INSULIN 500 unit/mL CONCENTRATED injection INJECT 58 UNITS SUBCUTANEOUSLY ONCE DAILY TO BE USED IN INSULIN PUMP.  5   ??? HYDROcodone-acetaminophen (NORCO) 5-325 mg per tablet Take 1-2 tablets every 6 hours as needed for pain 15 tablet 0   ??? hydrocortisone-iodoquinL-aloe2 (ALCORTIN A) 2-1-1 % Gel Apply topically.      ??? INSULIN PUMP CONTROLLER MISC by Miscellaneous route.     ??? KLOR-CON M20 20 mEq tablet TAKE 1 TABLET (20 MEQ TOTAL) BY MOUTH ONCE DAILY  1   ??? levothyroxine (SYNTHROID, LEVOTHROID) 50 MCG tablet TAKE 1 TABLET DAILY ON AN EMPTY STOMACH WITH A GLASS OF WATER 30-60 MINUTES BEFORE BREAKFAST.  0   ??? lidocaine (XYLOCAINE) 5 % ointment Apply to painful areas every 3-4 hours as needed 60 g 11   ??? lisinopril (PRINIVIL,ZESTRIL) 10 MG tablet Take 10 mg by mouth daily.     ??? meloxicam (MOBIC) 15 MG tablet Take 15 mg by mouth daily.  1   ??? metFORMIN (GLUCOPHAGE) 500 MG tablet Take 1,000 mg by mouth Two (2) times a day.     ??? metroNIDAZOLE (METROGEL) 1 % gel Apply topically daily. Requires name brand for Medicaid 60 g 11   ??? multivitamin capsule Take 1 capsule by mouth daily.     ??? pantoprazole (PROTONIX) 40 MG tablet Take 40 mg by mouth.     ??? pen needle, diabetic 31 gauge x 5/16 Ndle Use 1 needle to skin four times daily as directed     ??? secukinumab (COSENTYX) 150 mg/mL PnIj injection Inject the contents of 2 pens (300 mg total) under the skin once a week for the first 5 weeks as loading doses 10 mL 0   ??? secukinumab (COSENTYX) 150 mg/mL PnIj injection Inject the contents of 2 pens (300 mg total) under the skin every fourteen (14) days as maintenance doses.  START 14 days after last loading dose. 4 mL 3   ??? SUBCUTANEOUS INSULIN PUMP MISC by Miscellaneous route.       No current facility-administered medications for this visit.        Changes to medications: Summer Russell reports no changes at this time.    Allergies   Allergen Reactions   ??? [employer] Other (See Comments)   ??? Lamictal [Lamotrigine] Rash   ??? Oxycodone Nausea And Vomiting       Changes to allergies: No    SPECIALTY MEDICATION ADHERENCE     Cosentyx - 4 sets of 2 pens.   Medication Adherence    Patient reported X missed doses in the last month: 0  Specialty Medication: Cosentyx  Patient is on additional specialty medications: No     (rest of load)    Specialty medication(s) dose(s) confirmed: Regimen is correct and unchanged.     Are there any concerns with adherence? No    Adherence counseling provided? Not needed    CLINICAL MANAGEMENT AND INTERVENTION      Clinical Benefit Assessment:    Do you feel the medicine is effective or helping your condition? No    Clinical Benefit counseling provided? Not needed    Adverse Effects Assessment:    Are you experiencing any side effects? No    Are you experiencing difficulty administering your medicine? Yes, patient reports difficulty injecting. Medication administration counseling provided: trying leg vs. pinching up skin around belly to firm up injection spot    Quality of Life Assessment:    How many days over the past month did your HS  keep you from your normal activities? For example, brushing your teeth or getting up in the  morning. Patient declined to answer    Have you discussed this with your provider? Yes    Acute Infection Status:    Acute infections noted within Epic:  No active infections  Patient reported infection: None    Therapy Appropriateness:    Is therapy appropriate? Yes, therapy is appropriate and should be continued    DISEASE/MEDICATION-SPECIFIC INFORMATION      For patients on injectable medications: Patient currently has 4 doses left.  Next injection is scheduled for 5/4.    PATIENT SPECIFIC NEEDS     - Does the patient have any physical, cognitive, or cultural barriers? No    - Is the patient high risk? No    - Does the patient require a Care Management Plan? No     - Does the patient require physician intervention or other additional services (i.e. nutrition, smoking cessation, social work)? No      SHIPPING     Specialty Medication(s) to be Shipped:   Inflammatory Disorders: Cosentyx    Other medication(s) to be shipped: No additional medications requested for fill at this time     Changes to insurance: No    Delivery Scheduled: Yes, Expected medication delivery date: Thurs, 6/2.     Medication will be delivered via Same Day Courier to the confirmed prescription address in [facility].    The patient will receive a drug information handout for each medication shipped and additional FDA Medication Guides as required.  Verified that patient has previously received a Conservation officer, historic buildings and a Surveyor, mining.    All of the patient's questions and concerns have been addressed.    Summer Russell   [facility] Specialty Pharmacist

## 2021-01-04 DIAGNOSIS — L732 Hidradenitis suppurativa: Principal | ICD-10-CM

## 2021-01-05 ENCOUNTER — Ambulatory Visit
Admit: 2021-01-05 | Discharge: 2021-01-05 | Disposition: A | Discharge status: Home / Self Care | Payer: BLUE CROSS/BLUE SHIELD

## 2021-01-05 ENCOUNTER — Ambulatory Visit: Admit: 2021-01-05 | Discharge: 2021-01-06 | Payer: BLUE CROSS/BLUE SHIELD

## 2021-01-05 LAB — BASIC METABOLIC PANEL
ANION GAP: 9 mmol/L (ref 5–14)
BLOOD UREA NITROGEN: 22 mg/dL (ref 9–23)
BUN / CREAT RATIO: 24
CALCIUM: 7.8 mg/dL — ABNORMAL LOW (ref 8.7–10.4)
CHLORIDE: 106 mmol/L (ref 98–107)
CO2: 23.4 mmol/L (ref 20.0–31.0)
CREATININE: 0.91 mg/dL — ABNORMAL HIGH
EGFR CKD-EPI (2021) FEMALE: 80 mL/min/{1.73_m2} (ref >=60)
GLUCOSE RANDOM: 154 mg/dL (ref 70–179)
POTASSIUM: 4 mmol/L (ref 3.4–4.8)
SODIUM: 138 mmol/L (ref 135–145)

## 2021-01-05 LAB — CBC W/ AUTO DIFF
BASOPHILS ABSOLUTE COUNT: 0.1 10*9/L (ref 0.0–0.1)
BASOPHILS RELATIVE PERCENT: 1.1 %
EOSINOPHILS ABSOLUTE COUNT: 0.6 10*9/L — ABNORMAL HIGH (ref 0.0–0.5)
EOSINOPHILS RELATIVE PERCENT: 10.2 %
HEMATOCRIT: 35.3 % (ref 34.0–44.0)
HEMOGLOBIN: 12.4 g/dL (ref 11.3–14.9)
LYMPHOCYTES ABSOLUTE COUNT: 1.9 10*9/L (ref 1.1–3.6)
LYMPHOCYTES RELATIVE PERCENT: 31.8 %
MEAN CORPUSCULAR HEMOGLOBIN CONC: 35.1 g/dL (ref 32.0–36.0)
MEAN CORPUSCULAR HEMOGLOBIN: 31.2 pg (ref 25.9–32.4)
MEAN CORPUSCULAR VOLUME: 89.1 fL (ref 77.6–95.7)
MEAN PLATELET VOLUME: 8 fL (ref 6.8–10.7)
MONOCYTES ABSOLUTE COUNT: 0.5 10*9/L (ref 0.3–0.8)
MONOCYTES RELATIVE PERCENT: 8.8 %
NEUTROPHILS ABSOLUTE COUNT: 2.9 10*9/L (ref 1.8–7.8)
NEUTROPHILS RELATIVE PERCENT: 48.1 %
NUCLEATED RED BLOOD CELLS: 0 /100{WBCs} (ref <=4)
PLATELET COUNT: 305 10*9/L (ref 150–450)
RED BLOOD CELL COUNT: 3.96 10*12/L (ref 3.95–5.13)
RED CELL DISTRIBUTION WIDTH: 13.1 % (ref 12.2–15.2)
WBC ADJUSTED: 6 10*9/L (ref 3.6–11.2)

## 2021-01-05 MED ORDER — DOXYCYCLINE HYCLATE 100 MG CAPSULE
ORAL_CAPSULE | Freq: Two times a day (BID) | ORAL | 0 refills | 10 days | Status: CP
Start: 2021-01-05 — End: 2021-01-15

## 2021-01-05 MED ADMIN — heparin, porcine (PF) 100 unit/mL injection 300 Units: 300 [IU] | INTRAVENOUS | @ 11:00:00 | Stop: 2021-01-05

## 2021-01-05 MED ADMIN — doxycycline (VIBRA-TABS) tablet/capsule 100 mg: 100 mg | ORAL | @ 11:00:00 | Stop: 2021-01-05

## 2021-01-05 MED ADMIN — inFLIXimab-abda (RENFLEXIS) 10 mg/kg = 1,700 mg in sodium chloride (NS) 500 mL IVPB: 10 mg/kg | INTRAVENOUS | @ 14:00:00 | Stop: 2021-01-05

## 2021-01-05 MED ADMIN — cetirizine (ZyrTEC) tablet 10 mg: 10 mg | ORAL | @ 13:00:00 | Stop: 2021-01-05

## 2021-01-05 MED ADMIN — sodium chloride (NS) 0.9 % infusion: 50 mL/h | INTRAVENOUS | @ 09:00:00 | Stop: 2021-01-05

## 2021-01-05 MED ADMIN — acetaminophen (TYLENOL) tablet 650 mg: 650 mg | ORAL | @ 13:00:00 | Stop: 2021-01-05

## 2021-01-05 MED ADMIN — ketorolac (TORADOL) injection 30 mg: 30 mg | INTRAVENOUS | @ 11:00:00 | Stop: 2021-01-05

## 2021-01-05 MED ADMIN — heparin, porcine (PF) 100 unit/mL injection 500 Units: 500 [IU] | INTRAVENOUS | @ 16:00:00 | Stop: 2021-01-05

## 2021-01-05 MED ADMIN — ondansetron (ZOFRAN-ODT) disintegrating tablet 8 mg: 8 mg | ORAL | @ 13:00:00 | Stop: 2021-01-05

## 2021-01-05 MED FILL — COSENTYX PEN 300 MG/2 PENS (150 MG/ML) SUBCUTANEOUS: SUBCUTANEOUS | 28 days supply | Qty: 4 | Fill #0

## 2021-01-05 NOTE — Unmapped (Signed)
Patient arrived to clinic with support person. She stated that she was in the emergency room last night due to her abdominal swelling. She reports being given PO antibiotics. Patient reports her pain 8/10 in her abdomen. Patient unable to stand for weight and reported previous weight.     Dr. Janyth Contes was contacted in reference to continue with treatment plan today.      VS taken and patient afebrile. Premedications given and chest port accessed without difficulty. Blood return noted.     Infliximab-abda 1,700 mg in 500 ml of NS started at 0945 at    100 ml/hr x 15 min  300 ml/hr x remaining of infusion    Infusion completed at 1130 . VS taken during infusion and upon completion. Port-flushed with NS and heparin; de-accessed with gauze and dressing applied.   Patient in no distress, A&O.

## 2021-01-05 NOTE — Unmapped (Signed)
Emergency Department Provider Note      ED Clinical Impression     Final diagnoses:   Dependent lymphedema due to impaired mobility (Primary)   Abdominal wall cellulitis       ED Assessment/Plan     Summer Russell is a 45 y.o. female with a PMH of hypertension, lymphedema, fibromyalgia, T2DM, hidradenitis suppurativa, and arthritis [name] for evaluation of right lower quadrant pain that radiates across to the left sided abdomen with associated burning sensation starting in April.    Patient is morbidly obese. No acute distress. In triage, vitals notable for tachycardia to 106. She has a peau d'orange appearance. Diffusely tender over the panus with surrounding erythema. There is also warmth to the area, worse on the left panus. More tender on the left side. No sign of candidiasis. No skin break down. Thre is a psoriasis rash noted on the proximal thigh bilaterally.     Differential includes cellulitis vs worsening of lymphadema      Plan for basic labs, and blood cultures. Will give saline fluids.    Pt is immunocompromised and a diabetic  Although this looks more like lymphedema, I think starting an antibiotic is in her best interests    05:51  Labs unremarkable. Will treat patient pain medication (Toradol) and discharge with prescription for doxycycline.     History     Chief Complaint   Patient presents with   ??? Abdominal Pain     HPI     Summer Russell is a 45 y.o. female with a PMH of hypertension, lymphedema, fibromyalgia, T2DM, hidradenitis suppurativa, and arthritis [name] for evaluation of abdominal pain. The patient reports lower right quadrant pain that radiates across to the left sided abdomen with associated burning sensation starting in April. Of note she notes that she has lymphedema and that she often has RLQ pain associated with this. However she ntoes that this pain is usually not accompanied with a burning sensation and does not track to the left side. Furhter, she states that she is usually able to manage the flares and symptoms with massages, but has been unable to do so this time. She is concerned about the burning sensation and the location, worried that it may be infected. She is immunosuppressed. She has gone to lymphedema PT in the past, and is planning to resume these meetings. She has seen her PCP recently regarding this complaint, but states she was unable to provide.     Past Medical History:   Diagnosis Date   ??? Diabetes mellitus (CMS-HCC)    ??? Disease of thyroid gland    ??? Fibromyalgia    ??? Hydradenitis    ??? Psoriasis    ??? Psoriatic arthritis (CMS-HCC)    ??? Sleep apnea        Past Surgical History:   Procedure Laterality Date   ??? IR INSERT PORT AGE GREATER THAN 5 YRS  11/06/2019    IR INSERT PORT AGE GREATER THAN 5 YRS 11/06/2019 Soledad Gerlach, MD [facility]   ??? SKIN BIOPSY     ??? TONSILLECTOMY         Family History   Adopted: Yes   Problem Relation Age of Onset   ??? Diabetes Mother    ??? Hypertension Mother    ??? Diabetes Father    ??? Hypertension Father    ??? Melanoma Neg Hx    ??? Basal cell  carcinoma Neg Hx    ??? Squamous cell carcinoma Neg Hx        Social History     Socioeconomic History   ??? Marital status: Divorced   Tobacco Use   ??? Smoking status: Former Smoker     Packs/day: 2.00     Years: 20.00     Pack years: 40.00     Quit date: 2013     Years since quitting: 9.4   ??? Smokeless tobacco: Never Used   Substance and Sexual Activity   ??? Alcohol use: No   ??? Drug use: No   Other Topics Concern   ??? Do you use sunscreen? Yes   ??? Tanning bed use? No   ??? Are you easily burned? No   ??? Excessive sun exposure? No   ??? Blistering sunburns? Yes     No current facility-administered medications for this encounter.    Current Outpatient Medications:   ???  albiglutide 50 mg/0.5 mL PnIj, Inject 50 mg under the skin., Disp: , Rfl:   ???  alpha lipoic acid 200 mg cap, Take 200 mg by mouth., Disp: , Rfl:   ???  amoxicillin-clavulanate (AUGMENTIN) 875-125 mg per tablet, Take twice daily, Disp: 60 tablet, Rfl: 2  ???  ascorbic Acid (VITAMIN C) 500 mg CpER, Take 500 mg by mouth., Disp: , Rfl:   ???  aspirin (ECOTRIN) 81 MG tablet, Take 81 mg by mouth daily., Disp: , Rfl:   ???  atorvastatin (LIPITOR) 20 MG tablet, Take 20 mg by mouth daily., Disp: , Rfl:   ???  BD INSULIN SYRINGE ULTRA-FINE 1 mL 31 gauge x 5/16 (8 mm) Syrg, USE 2 (TWO) TIMES DAILY AS DIRECTED, Disp: , Rfl:   ???  blood sugar diagnostic (GLUCOSE BLOOD) Strp, Frequency:PHARMDIR   Dosage:0.0     Instructions:  Note:Use to test blood sugar in the morning before breakfast and in the evening two hours after dinner, ISD-9 250.00. Dose: 1, Disp: , Rfl:   ???  blood-glucose meter (ACCU-CHEK AVIVA PLUS METER) Misc, U QID UTD, Disp: , Rfl:   ???  brimonidine (MIRVASO) 0.33 % Gel, Apply every 12 hours to reduce redness, Disp: 30 g, Rfl: 11  ???  cetirizine (ZYRTEC) 10 MG tablet, Take 10 mg by mouth daily., Disp: , Rfl:   ???  cholecalciferol, vitamin D3, 1,000 unit (25 mcg) tablet, Take 1,000 Units by mouth., Disp: , Rfl:   ???  clindamycin (CLEOCIN) 300 MG capsule, TAKE 1 CAPSULE (300 MG TOTAL) BY MOUTH TWO (2) TIMES A DAY., Disp: 60 capsule, Rfl: 6  ???  clobetasoL (TEMOVATE) 0.05 % ointment, Apply topically Two (2) times a day., Disp: 60 g, Rfl: 5  ???  drospirenone-ethinyl estradioL (YAZ) 3-0.02 mg per tablet, Take 1 tablet by mouth daily., Disp: 84 tablet, Rfl: 3  ???  empagliflozin 25 mg Tab, Take 25 mg by mouth., Disp: , Rfl:   ???  empty container Misc, Use as directed to dispose of Cosentyx pens., Disp: 1 each, Rfl: 2  ???  finasteride (PROSCAR) 5 mg tablet, Take 1 tablet (5 mg total) by mouth daily., Disp: 90 tablet, Rfl: 3  ???  fluconazole (DIFLUCAN) 150 MG tablet, TAKE 1 TAB NOW AN THEN TAKE 1 TABLET ONCE WEEKLY AS NEEDED FOR YEAST INFECTION, Disp: 4 tablet, Rfl: 5  ???  furosemide (LASIX) 40 MG tablet, Take 40 mg by mouth daily. , Disp: , Rfl:   ???  gabapentin (NEURONTIN) 300 MG capsule, Take 1 capsule (  300 mg total) by mouth Two (2) times a day., Disp: 60 capsule, Rfl: 0  ???  HUMULIN R U-500, CONC, INSULIN 500 unit/mL CONCENTRATED injection, INJECT 58 UNITS SUBCUTANEOUSLY ONCE DAILY TO BE USED IN INSULIN PUMP., Disp: , Rfl: 5  ???  HYDROcodone-acetaminophen (NORCO) 5-325 mg per tablet, Take 1-2 tablets every 6 hours as needed for pain, Disp: 15 tablet, Rfl: 0  ???  hydrocortisone-iodoquinL-aloe2 (ALCORTIN A) 2-1-1 % Gel, Apply topically. , Disp: , Rfl:   ???  INSULIN PUMP CONTROLLER MISC, by Miscellaneous route., Disp: , Rfl:   ???  KLOR-CON M20 20 mEq tablet, TAKE 1 TABLET (20 MEQ TOTAL) BY MOUTH ONCE DAILY, Disp: , Rfl: 1  ???  levothyroxine (SYNTHROID, LEVOTHROID) 50 MCG tablet, TAKE 1 TABLET DAILY ON AN EMPTY STOMACH WITH A GLASS OF WATER 30-60 MINUTES BEFORE BREAKFAST., Disp: , Rfl: 0  ???  lidocaine (XYLOCAINE) 5 % ointment, Apply to painful areas every 3-4 hours as needed, Disp: 60 g, Rfl: 11  ???  lisinopril (PRINIVIL,ZESTRIL) 10 MG tablet, Take 10 mg by mouth daily., Disp: , Rfl:   ???  meloxicam (MOBIC) 15 MG tablet, Take 15 mg by mouth daily., Disp: , Rfl: 1  ???  metFORMIN (GLUCOPHAGE) 500 MG tablet, Take 1,000 mg by mouth Two (2) times a day., Disp: , Rfl:   ???  metroNIDAZOLE (METROGEL) 1 % gel, Apply topically daily. Requires name brand for Medicaid, Disp: 60 g, Rfl: 11  ???  multivitamin capsule, Take 1 capsule by mouth daily., Disp: , Rfl:   ???  pantoprazole (PROTONIX) 40 MG tablet, Take 40 mg by mouth., Disp: , Rfl:   ???  pen needle, diabetic 31 gauge x 5/16 Ndle, Use 1 needle to skin four times daily as directed, Disp: , Rfl:   ???  secukinumab (COSENTYX) 150 mg/mL PnIj injection, Inject the contents of 2 pens (300 mg total) under the skin once a week for the first 5 weeks as loading doses, Disp: 10 mL, Rfl: 0  ???  secukinumab (COSENTYX) 150 mg/mL PnIj injection, Inject the contents of 2 pens (300 mg total) under the skin every fourteen (14) days as maintenance doses.  START 14 days after last loading dose., Disp: 4 mL, Rfl: 3  ???  SUBCUTANEOUS INSULIN PUMP MISC, by Miscellaneous route., Disp: , Rfl:     Review of Systems    A 10 point review of systems was performed and negative except as noted in the history of present illness.    Physical Exam     BP 138/108  - Pulse 106  - Temp 37.2 ??C (99 ??F) (Oral)  - Resp 18  - SpO2 99%     Physical Exam  Vitals and nursing note reviewed.   Constitutional:       General: She is not in acute distress.     Appearance: She is well-developed.   HENT:      Head: Normocephalic and atraumatic.      Nose: Nose normal.      Mouth/Throat:      Pharynx: No oropharyngeal exudate.   Eyes:      Conjunctiva/sclera: Conjunctivae normal.      Pupils: Pupils are equal, round, and reactive to light.   Neck:      Thyroid: No thyromegaly.      Vascular: No JVD.      Trachea: No tracheal deviation.   Cardiovascular:      Rate and Rhythm: Normal rate and regular rhythm.  Heart sounds: Normal heart sounds. No murmur heard.    No friction rub. No gallop.   Pulmonary:      Effort: Pulmonary effort is normal. No respiratory distress.      Breath sounds: Normal breath sounds. No stridor. No wheezing or rales.   Chest:      Chest wall: No tenderness.   Abdominal:      General: Bowel sounds are normal. There is no distension.      Palpations: Abdomen is soft. There is no mass.      Tenderness: There is no abdominal tenderness. There is no guarding or rebound.   Musculoskeletal:         General: No tenderness. Normal range of motion.      Cervical back: Normal range of motion and neck supple.   Lymphadenopathy:      Cervical: No cervical adenopathy.   Skin:     General: Skin is warm and dry.      Coloration: Skin is not pale.      Findings: Rash present. No erythema.      Comments: Abdomen with a peau d'orange appearance. Diffusely tender over the panus with surrounding erythema. Warmth to the area, worse on the left panus. More tender on the left side. No sign of candidiasis. No skin break down. Thre is a psoriasis rash noted on the proximal thigh bilaterally. Neurological:      Mental Status: She is alert and oriented to person, place, and time.      Motor: No abnormal muscle tone.      Coordination: Coordination normal.   Psychiatric:         Behavior: Behavior normal.         Thought Content: Thought content normal.         Judgment: Judgment normal.         ED Course           Labs Reviewed   BLOOD CULTURE   BLOOD CULTURE   BASIC METABOLIC PANEL   CBC W/ DIFFERENTIAL    Narrative:     The following orders were created for panel order CBC w/ Differential.  Procedure                               Abnormality         Status                     ---------                               -----------         ------                     CBC w/ Differential[[id number]]                                                          Please view results for these tests on the individual orders.   CBC W/ AUTO DIFF     MDM  Reviewed: previous chart, nursing note and vitals  Reviewed previous: labs  Interpretation: labs         Documentation assistance  was provided by Dallas Schimke, Scribe on January 05, 2021 at 3:15 AM for Marisa Severin, MD.   January 05, 2021 6:52 PM. Documentation assistance provided by the scribe. I was present during the time the encounter was recorded. The information recorded by the scribe was done at my direction and has been reviewed and validated by me.        Desma Mcgregor, MD  01/05/21 [phone]

## 2021-01-05 NOTE — Unmapped (Signed)
We received the referral for home health services for your patient. Unfortunately the patient's address in Epic is outside of [facility] service area.   Below are names/contact information for home health agencies that may provide service in that area.  I'm very sorry for any inconvenience.     In Network Providers:  [facility]  [phone]      [facility]  [phone]      [facility]  [phone]      [facility]  [phone]      [facility]  [phone]          [facility]  [phone]    [facility]   [phone]

## 2021-01-05 NOTE — Unmapped (Signed)
Pt reports hx Lymphedema and pain in Lower R quad of abdomen swelling and pain radiating across lower abdomen Right to Left. Flare started in April

## 2021-01-06 NOTE — Unmapped (Signed)
Blood Culture    Order: [id number] and Order: [id number]  Status: Preliminary result ??        No Growth at 24 hours

## 2021-01-07 NOTE — Unmapped (Signed)
Blood Culture ??  Order: [id number] and Order: [id number]  Status: Preliminary result ??  ??  ??  ??  No Growth at 48 hours

## 2021-01-09 NOTE — Unmapped (Signed)
Blood Culture ??  Order: [id number] and Order: [id number]  Status: Preliminary result ??  ??  ??  ??  No Growth at 4 days

## 2021-01-10 NOTE — Unmapped (Signed)
Blood Culture ??  Order: [id number] and Order: [id number]  Status: Final result ??  ??  ??  ??  No Growth at 5 days

## 2021-01-15 NOTE — Unmapped (Signed)
For HS:  - Continue Remicade infusions every 4 weeks  -Continue taking one tablet of finasteride once daily  -Continue taking one tablet of clindamycin  twice daily  - Start Cosentxy as instructed.      For psoriasis:  - Continue applying clobetasol ointment to affected areas twice daily as needed.     For inverse psoriasis:  - Continue applying triamcinolone ointment to affected area twice daily as needed.     For rosacea:  - Continue applying metronidazole gel every morning as needed

## 2021-01-15 NOTE — Unmapped (Signed)
Dermatology Note     Assessment and Plan:      Hidradenitis Suppurativa, Hurley 3  High risk medication use: remicade, Cosentyx   - We discussed the typical natural history, pathogenesis, treatment options, and expected course as well as the relapsing and sometimes recalcitrant nature of the disease.    - Discussed option of small surgical procedure in the future, but patient is satisfied with current regiment.  - Based on discussion above and r/b/a reviewed, mutual decision to proceed with current regimen as follows:  - Continue infliximab 10mg /kg q4??weeks.?? Discussed risks of tuberculosis, other uncommon infections, theoretical risk of malignancies, and other uncommon side effects. Could consider increasing frequency again in the future if she continues to flare around the 4wk mark  ????????????????????????- Monitoring labs reviewed. ??Continue to monitor every 8 weeks.  - Continue??finasteride 5mg  once daily  - Continue??clindamycin 300mg  twice daily  - Continue secukinumab (COSENTYX) 150 mg/mL Inject 2 mL (300 mg total) under the skin every fourteen (14) days. Maintenance doses.   ??  Psoriasis  - Continue??clobetasol 0.05% ointment applied to affected areas twice daily as needed. ??Appropriate use and side effects discussed. For legs  -??For inverse psoriasis:??Continue triamcinolone 0.1% ointment applied to affected area twice daily as needed????  ??  Rosacea, mostly with flushing, not addressed today  - Continue metronidazole??gel every morning??prn    The patient was advised to call for an appointment should any new, changing, or symptomatic lesions develop.     RTC: Return in about 6 months (around 07/18/2021) for follow up of HS. or sooner as needed   _________________________________________________________________      Chief Complaint     Follow-up of HS and psoriasis    HPI     Summer Russell is a 45 y.o. female who presents as a returning patient (last seen by Dr.Ksenia Kunz on 10/10/2020) to [facility] for follow-up of hidradenitis suppurativa and psoriasis. At last visit, patient was to continue infliximab 10 mg/kg infusions every 4 weeks, finasteride 5 mg daily, and clindamycin 300 mg twice daily, and start Cosentyx 300 mg every 14 days (after loading doses) for treatment of HS. For rosacea she was to continue metronidazole gel daily as needed. Additionally she was to continue clobetasol 0.05% ointment for psoriasis and triamcinolone 0.1% ointment for inverse psoriasis.    Today patient reports improvement of both her HS and psoriasis. She states that the only area of active HS is under her right breast which has a draining lesion. Psoriasis has persisted on her legs but has improved. She is just now finished with the loading doses of Cosentyx and she has already seen a lot of improvement in her disease.    The patient denies any other new or changing lesions or areas of concern.     Pertinent Past Medical History     No history of skin cancer    Disease course:  Year when symptoms first noticed: age 50  Year of diagnosis: age 61  Who diagnosed you? other ER Doctor  Location of first symptoms: axillae  Typical involved areas include: groin, axillae, inframammary, inner thighs and buttocks  Typical number of inflammatory lesions each month at baseline (from first visit): 5-10  Disease triggers: stress, sweat, heat, exercise and menstrual cycle  ??  Are menstrual cycles irregular when not on birth control? Yes.  Current form of contraception: no  Effect of hormonal contraception on disease: no change  Flaring with menstrual cycle (before, during, or after?): before  Difficulty becoming pregnancy? Yes.  Pregnancy complications? miscarriage ??How many children? 0  Better or worse with pregnancy? ??never pregnant. ??If so, worse during a specific trimester? never pregnant????  ??  Prior treatments:  Topical: dovonex  Systemic: antibiotics (oral and IV), humira, Remicade  Past surgical procedures: groin, scalp  Past laser procedures: none    Family History:   Negative for melanoma    Past Medical History, Family History, Social History, Medication List, Allergies, and Problem List were reviewed in the rooming section of Epic.     ROS: Other than symptoms mentioned in the HPI, no fevers, chills, or other skin complaints    Physical Examination     Gen: Well-appearing patient, appropriate, interactive, in no acute distress  Skin: Examination of the scalp, face, neck, chest, back, abdomen, bilateral upper and lower extremities, hands, palms, soles, nails, buttocks, and external genitalia performed today and pertinent for:     ??  location Abscess Inflamed nodule Non-inflamed nodule Draining sinus Non-draining Sinus Hurley BSA at site Color change  Inflamed induration Open skin surface  Tunnels   R axilla ??  ?? ?? ?? ?? ?? ?? ?? ?? ??   L axilla ?? ?? ?? ?? ?? ?? ?? ?? ?? ?? ??   R inframammary ?? ?? ?? ?? ??1 ?? ?? ?? ?? ?? ??   L inframammary ??  ?? ?? ?? ?? ?? ?? ?? ?? ??   Intermammary ?? ?? ?? ?? ?? ?? ?? ?? ?? ?? ??   Pubic ?? ?? ?? ?? ?? ?? ?? ?? ?? ?? ??   R inguinal ??  ?? ?? ?? ?? ?? ?? ?? ?? ??   R thigh ?? ?? ?? ?? ?? ?? ?? ?? ?? ?? ??   L inguinal ??  ??  ??1 ?? ?? ?? ?? ?? ??   L thigh ?? ?? ?? ?? ?? ?? ?? ?? ?? ?? ??   Scrotum/Vulva ?? ?? ?? ?? ?? ?? ?? ?? ?? ?? ??   Perianal ?? ?? ?? ?? ?? ?? ?? ?? ?? ?? ??   R buttock ?? ?? ?? ?? ?? ?? ?? ?? ?? ?? ??   L buttock ?? ?? ?? ?? ?? ?? ?? ?? ?? ?? ??   Other (list) ?? ?? ?? ?? ?? ?? ?? ?? ?? ?? ??   ?? ?? ?? ?? ?? ?? ?? ?? ?? ?? ?? ??   *0=none, 1=mild, 2=moderate, 3=severe  ??    - Few thin, scaly erythematous plaques on the bilateral medial thighs    Sites not commented on demonstrate normal findings.        Scribe's Attestation: Elsie Stain, MD obtained and performed the history, physical exam and medical decision making elements that were entered into the chart.  Signed by Ward Chatters, Scribe, on January 16, 2021 at 2:46 PM.    ----------------------------------------------------------------------------------------------------------------------  January 16, 2021 11:23 PM. Documentation assistance provided by the Scribe. I was present during the time the encounter was recorded. The information recorded by the Scribe was done at my direction and has been reviewed and validated by me.  ----------------------------------------------------------------------------------------------------------------------         (Approved Template 04/18/2020)

## 2021-01-16 ENCOUNTER — Ambulatory Visit: Admit: 2021-01-16 | Discharge: 2021-01-17 | Payer: BLUE CROSS/BLUE SHIELD

## 2021-01-16 DIAGNOSIS — Z79899 Other long term (current) drug therapy: Principal | ICD-10-CM

## 2021-01-16 DIAGNOSIS — L409 Psoriasis, unspecified: Principal | ICD-10-CM

## 2021-01-16 DIAGNOSIS — L732 Hidradenitis suppurativa: Principal | ICD-10-CM

## 2021-01-31 ENCOUNTER — Ambulatory Visit: Admit: 2021-01-31 | Discharge: 2021-02-01 | Payer: BLUE CROSS/BLUE SHIELD

## 2021-01-31 LAB — CREATININE
CREATININE: 0.88 mg/dL — ABNORMAL HIGH
EGFR CKD-EPI (2021) FEMALE: 83 mL/min/{1.73_m2} (ref >=60)

## 2021-01-31 LAB — CBC W/ AUTO DIFF
BASOPHILS ABSOLUTE COUNT: 0.1 10*9/L (ref 0.0–0.1)
BASOPHILS RELATIVE PERCENT: 1.4 %
EOSINOPHILS ABSOLUTE COUNT: 1.2 10*9/L — ABNORMAL HIGH (ref 0.0–0.5)
EOSINOPHILS RELATIVE PERCENT: 17.1 %
HEMATOCRIT: 35.7 % (ref 34.0–44.0)
HEMOGLOBIN: 12.7 g/dL (ref 11.3–14.9)
LYMPHOCYTES ABSOLUTE COUNT: 1.5 10*9/L (ref 1.1–3.6)
LYMPHOCYTES RELATIVE PERCENT: 22.6 %
MEAN CORPUSCULAR HEMOGLOBIN CONC: 35.6 g/dL (ref 32.0–36.0)
MEAN CORPUSCULAR HEMOGLOBIN: 31.3 pg (ref 25.9–32.4)
MEAN CORPUSCULAR VOLUME: 88.1 fL (ref 77.6–95.7)
MEAN PLATELET VOLUME: 9.3 fL (ref 6.8–10.7)
MONOCYTES ABSOLUTE COUNT: 0.4 10*9/L (ref 0.3–0.8)
MONOCYTES RELATIVE PERCENT: 6.2 %
NEUTROPHILS ABSOLUTE COUNT: 3.6 10*9/L (ref 1.8–7.8)
NEUTROPHILS RELATIVE PERCENT: 52.7 %
PLATELET COUNT: 277 10*9/L (ref 150–450)
RED BLOOD CELL COUNT: 4.05 10*12/L (ref 3.95–5.13)
RED CELL DISTRIBUTION WIDTH: 13.3 % (ref 12.2–15.2)
WBC ADJUSTED: 6.8 10*9/L (ref 3.6–11.2)

## 2021-01-31 LAB — SEDIMENTATION RATE: ERYTHROCYTE SEDIMENTATION RATE: 35 mm/h — ABNORMAL HIGH (ref 0–20)

## 2021-01-31 LAB — AST: AST (SGOT): 21 U/L (ref <=34)

## 2021-01-31 LAB — ALT: ALT (SGPT): 22 U/L (ref 10–49)

## 2021-01-31 LAB — C-REACTIVE PROTEIN: C-REACTIVE PROTEIN: 15 mg/L — ABNORMAL HIGH (ref <=10.0)

## 2021-01-31 LAB — BUN: BLOOD UREA NITROGEN: 26 mg/dL — ABNORMAL HIGH (ref 9–23)

## 2021-01-31 MED ADMIN — ondansetron (ZOFRAN-ODT) disintegrating tablet 8 mg: 8 mg | ORAL | @ 17:00:00 | Stop: 2021-01-31

## 2021-01-31 MED ADMIN — cetirizine (ZyrTEC) tablet 10 mg: 10 mg | ORAL | @ 17:00:00 | Stop: 2021-01-31

## 2021-01-31 MED ADMIN — heparin, porcine (PF) 100 unit/mL injection 500 Units: 500 [IU] | INTRAVENOUS | @ 19:00:00 | Stop: 2021-01-31

## 2021-01-31 MED ADMIN — inFLIXimab-abda (RENFLEXIS) 10 mg/kg = 1,800 mg in sodium chloride (NS) 500 mL IVPB: 10 mg/kg | INTRAVENOUS | @ 18:00:00 | Stop: 2021-01-31

## 2021-01-31 MED ADMIN — acetaminophen (TYLENOL) tablet 650 mg: 650 mg | ORAL | @ 17:00:00 | Stop: 2021-01-31

## 2021-01-31 NOTE — Unmapped (Signed)
Pt presents for accelerated Remicade.  Pt denies any recent infection, VSS. Port accessed with 20g X 3/4inch. Port was tilted forward and turned medially, labs drawn, premeds administered.  Pt aware of potential reaction/side effects, call bell within reach.    1357 Remicade 1800mg  started at the following rates:    21ml/hr for 15 min  638ml/hr for rest of infusion.    1458 Infusion complete. Pt tolerated without complication, VSS.  Port flushed per policy and d/c'd, gauze and Band-Aid applied.  Pt left clinic in no acute distress.

## 2021-01-31 NOTE — Unmapped (Signed)
[facility] has made a second and final attempt to reach this patient to refill the following medication:Cosentyx.      We have left voicemails on the following phone numbers: [phone] and have sent a MyChart message.    Dates contacted: 6/24 and 6/28  Last scheduled delivery: 6/2    The patient may be at risk of non-compliance with this medication. The patient should call the [facility] at [phone] (option 4) to refill medication.    Jonita Hirota D Administrator [facility] Specialty Technician

## 2021-02-07 NOTE — Unmapped (Unsigned)
Assessment:   45 y.o. No obstetric history on file. here for routine annual exam.    Plan:   1. Reproductive life planning:  - happy with     2. Preventive screening:  Cervical cancer:  - pap  - will send NL results, will call if AbNL  Breast cancer:  - discussed ACOG recommendations for initiation of screening mammograms  - referred today for MMG  - encouraged breast self-awareness and clinical breast exams  Colon cancer:   - encouraged high fiber diet  Osteoporosis prevention:  - reviewed importance of adequate calcium and vitamin D as well as weight bearing activities for maintaining bone health and balance  Skin cancer:  - encouraged avoiding peak sun exposure hours and using sun screen  Lab screening:  - cholesterol:    3. Weight/Exercise:  - Patient counseled GL:[id number] of a healthy diet and safe and sustainable weight loss goals. Also discussed the health benefits of exercise and recommendations for 30 minutes, 5 days/week.      Follow Up:     Return for Routine Exam in {Time; 1 month to 1 year:14528}.        Laurelin Gwenivere Hiraldo is a 45 y.o. No obstetric history on file. here for routine exam.      Current Complaints: ***.         Gynecologic History  Patient's last menstrual period was 01/06/2012.  Contraception: {methods:6305051}  Menstrual History: {History; Menstrual History:63020961}  Vaginal Discharge: {Responses; yes no:18458}  Sexually Active: {Desc; Sexually Active:63020963}  Last Pap: {Norm/abn:16337}    Last Mammogram: {Norm/abn:16337}    Preconceptual:   {Desc; Preconceptual:63020964}    Obstetric History  OB History   No obstetric history on file.       Patient's medications, allergies, past medical, surgical, social and family histories were reviewed and updated as appropriate.    Review of Systems  Review of systems is negative across the balance of 10 systems, except as noted above.    Objective:     Blood pressure 140/85, pulse 87, weight 269 lb 11.2 oz (122.335 kg), last menstrual period 01/06/2012.  General:     Alert, no distress   Head:  Normocephalic, without obvious abnormality   Oropharynx:  Normal findings   Neck:  No adenopathy thyroid not enlarged, symmetric, no tenderness/mass/nodules   Back: negative   Lungs:  Clear to auscultation bilaterally   Breasts {Exam; breast:10013139}      Heart:  Regular rate and rhythm, S1, S2  Normal, no murmur, click, rub or gallop   Abdomen:  Soft, non-tender, bowel sounds normal, no masses, no organomegaly   Pelvic: {Pelvic:63016852}   Extremities:  Extremities normal, atraumatic, no cyanosis or edema   Pulse: Not performed    Skin: Skin color, texture, turgor normal.  No rashes, or lesions   Neurologic:  Grossly normal   Lymph Nodes: Cervical, supraclavicular, and axillary nodes normal

## 2021-02-10 NOTE — Unmapped (Signed)
[facility] Refill Coordination Note    Specialty Medication(s) to be Shipped:   Inflammatory Disorders: Cosentyx    Other medication(s) to be shipped: No additional medications requested for fill at this time     Summer Russell, DOB: 1976-03-28  Phone: [phone] (home)       All above HIPAA information was verified with patient.     Was a Nurse, learning disability used for this call? No    Completed refill call assessment today to schedule patient's medication shipment from the [facility] [phone]).  All relevant notes have been reviewed.     Specialty medication(s) and dose(s) confirmed: Regimen is correct and unchanged.   Changes to medications: [name] reports no changes at this time.  Changes to insurance: No  New side effects reported not previously addressed with a pharmacist or physician: None reported  Questions for the pharmacist: No    Confirmed patient received a Conservation officer, historic buildings and a Surveyor, mining with first shipment. The patient will receive a drug information handout for each medication shipped and additional FDA Medication Guides as required.       DISEASE/MEDICATION-SPECIFIC INFORMATION        For patients on injectable medications: Patient currently has 0 doses left.  Next injection is scheduled for 02/15/21.    SPECIALTY MEDICATION ADHERENCE     Medication Adherence    Patient reported X missed doses in the last month: 0  Specialty Medication: COSENTYX PEN (2 PENS) 150 mg/mL  Patient is on additional specialty medications: No              Were doses missed due to medication being on hold? No        REFERRAL TO PHARMACIST     Referral to the pharmacist: Not needed      [facility]     Shipping address confirmed in Epic.     Delivery Scheduled: Yes, Expected medication delivery date: 02/14/21.     Medication will be delivered via UPS to the prescription address in Epic WAM.    Summer Russell   [facility] Specialty Technician

## 2021-02-13 MED FILL — COSENTYX PEN 300 MG/2 PENS (150 MG/ML) SUBCUTANEOUS: SUBCUTANEOUS | 28 days supply | Qty: 4 | Fill #1

## 2021-02-22 MED ORDER — NIKKI (28) 3 MG-0.02 MG TABLET
ORAL_TABLET | 2 refills | 0.00000 days
Start: 2021-02-22 — End: ?

## 2021-02-23 MED ORDER — NIKKI (28) 3 MG-0.02 MG TABLET
ORAL_TABLET | ORAL | 2 refills | 0.00000 days | Status: CP
Start: 2021-02-23 — End: ?

## 2021-02-28 ENCOUNTER — Ambulatory Visit: Admit: 2021-02-28 | Discharge: 2021-03-01 | Payer: BLUE CROSS/BLUE SHIELD

## 2021-02-28 MED ADMIN — ondansetron (ZOFRAN-ODT) disintegrating tablet 8 mg: 8 mg | ORAL | @ 17:00:00 | Stop: 2021-02-28

## 2021-02-28 MED ADMIN — heparin, porcine (PF) 100 unit/mL injection 500 Units: 500 [IU] | INTRAVENOUS | @ 19:00:00 | Stop: 2021-02-28

## 2021-02-28 MED ADMIN — cetirizine (ZyrTEC) tablet 10 mg: 10 mg | ORAL | @ 17:00:00 | Stop: 2021-02-28

## 2021-02-28 MED ADMIN — acetaminophen (TYLENOL) tablet 650 mg: 650 mg | ORAL | @ 17:00:00 | Stop: 2021-02-28

## 2021-02-28 MED ADMIN — inFLIXimab-abda (RENFLEXIS) 10 mg/kg = 1,800 mg in sodium chloride (NS) 500 mL IVPB: 10 mg/kg | INTRAVENOUS | @ 18:00:00 | Stop: 2021-02-28

## 2021-02-28 NOTE — Unmapped (Signed)
Pt presents for accelerated Remicade.  Pt denies any recent infection, VSS. Port accessed with 20g X 3/4in, premeds administered.  Pt aware of potential reaction/side effects, call bell within reach.    1341 Remicade 1800mg  started at the following rates:    264ml/hr for 15 min  66ml/hr for rest of infusion.    1445 Infusion complete. Pt tolerated without complication, VSS. Right chest port flushed per policy, heparin locked, and d/c'd, bandaid applied.  Pt left clinic in no acute distress.

## 2021-03-14 NOTE — Unmapped (Signed)
[facility] Refill Coordination Note    Specialty Medication(s) to be Shipped:   Inflammatory Disorders: Cosentyx    Other medication(s) to be shipped: No additional medications requested for fill at this time     Summer Russell, DOB: [birth year]  Phone: [phone] (home)       All above HIPAA information was verified with patient.     Was a Nurse, learning disability used for this call? No    Completed refill call assessment today to schedule patient's medication shipment from the [facility] [phone]).  All relevant notes have been reviewed.     Specialty medication(s) and dose(s) confirmed: Regimen is correct and unchanged.   Changes to medications: Summer Russell reports no changes at this time.  Changes to insurance: No  New side effects reported not previously addressed with a pharmacist or physician: None reported  Questions for the pharmacist: No    Confirmed patient received a Conservation officer, historic buildings and a Surveyor, mining with first shipment. The patient will receive a drug information handout for each medication shipped and additional FDA Medication Guides as required.       DISEASE/MEDICATION-SPECIFIC INFORMATION        For patients on injectable medications: Patient currently has 0 doses left.  Next injection is scheduled for 03/15/2021.    SPECIALTY MEDICATION ADHERENCE     Medication Adherence    Patient reported X missed doses in the last month: 0  Specialty Medication: COSENTYX PEN (2 PENS) 150 mg/mL  Patient is on additional specialty medications: No  Any gaps in refill history greater than 2 weeks in the last 3 months: no  Demonstrates understanding of importance of adherence: yes  Informant: patient  Reliability of informant: reliable  Confirmed plan for next specialty medication refill: delivery by pharmacy  Refills needed for supportive medications: not needed              Were doses missed due to medication being on hold? No        REFERRAL TO PHARMACIST     Referral to the pharmacist: Not needed      [facility]     Shipping address confirmed in Epic.     Delivery Scheduled: Yes, Expected medication delivery date: 03/15/2021.     Medication will be delivered via Same Day Courier to the prescription address in Epic WAM.    Summer Russell   [facility] Specialty Technician

## 2021-03-15 MED FILL — COSENTYX PEN 300 MG/2 PENS (150 MG/ML) SUBCUTANEOUS: SUBCUTANEOUS | 28 days supply | Qty: 4 | Fill #2

## 2021-03-20 DIAGNOSIS — L732 Hidradenitis suppurativa: Principal | ICD-10-CM

## 2021-03-20 MED ORDER — FINASTERIDE 5 MG TABLET
ORAL_TABLET | ORAL | 3 refills | 0.00000 days | Status: CP
Start: 2021-03-20 — End: ?

## 2021-03-20 NOTE — Unmapped (Signed)
Prescription refill request for finasteride 5mg , Last office visit was 01/16/2021    Please Advise

## 2021-03-28 ENCOUNTER — Ambulatory Visit: Admit: 2021-03-28 | Discharge: 2021-03-28 | Payer: BLUE CROSS/BLUE SHIELD

## 2021-03-28 MED ADMIN — ondansetron (ZOFRAN-ODT) disintegrating tablet 8 mg: 8 mg | ORAL | @ 17:00:00 | Stop: 2021-03-28

## 2021-03-28 MED ADMIN — cetirizine (ZyrTEC) tablet 10 mg: 10 mg | ORAL | @ 17:00:00 | Stop: 2021-03-28

## 2021-03-28 MED ADMIN — inFLIXimab-abda (RENFLEXIS) 10 mg/kg = 1,800 mg in sodium chloride (NS) 500 mL IVPB: 10 mg/kg | INTRAVENOUS | @ 18:00:00 | Stop: 2021-03-28

## 2021-03-28 MED ADMIN — acetaminophen (TYLENOL) tablet 650 mg: 650 mg | ORAL | @ 17:00:00 | Stop: 2021-03-28

## 2021-03-28 NOTE — Unmapped (Signed)
Pt presents for accelerated Remicade.    Pt denies any recent infection, VSS.   Port accessed with 20g X 3/4inch.   Port was tilted forward and turned medially premeds administered.    Pt aware of potential reaction/side effects, call bell within reach.  ??  @ 1345 pm    Remicade 1800mg  started at the following rates:  ??  281ml/hr for 15 min  621ml/hr for rest of infusion.  ??  @ 1444 pm    Infusion complete.   Pt tolerated without complication, VSS.    Port flushed per policy and d/c'd, gauze and Band-Aid applied.    Pt left clinic in no acute distress.

## 2021-04-20 DIAGNOSIS — L732 Hidradenitis suppurativa: Principal | ICD-10-CM

## 2021-04-25 ENCOUNTER — Ambulatory Visit: Admit: 2021-04-25 | Discharge: 2021-04-26 | Payer: BLUE CROSS/BLUE SHIELD

## 2021-04-25 DIAGNOSIS — L732 Hidradenitis suppurativa: Principal | ICD-10-CM

## 2021-04-25 LAB — CBC W/ AUTO DIFF
BASOPHILS ABSOLUTE COUNT: 0.1 10*9/L (ref 0.0–0.1)
BASOPHILS RELATIVE PERCENT: 1.5 %
EOSINOPHILS ABSOLUTE COUNT: 0.3 10*9/L (ref 0.0–0.5)
EOSINOPHILS RELATIVE PERCENT: 6.6 %
HEMATOCRIT: 31.9 % — ABNORMAL LOW (ref 34.0–44.0)
HEMOGLOBIN: 11.4 g/dL (ref 11.3–14.9)
LYMPHOCYTES ABSOLUTE COUNT: 1.4 10*9/L (ref 1.1–3.6)
LYMPHOCYTES RELATIVE PERCENT: 25.8 %
MEAN CORPUSCULAR HEMOGLOBIN CONC: 35.8 g/dL (ref 32.0–36.0)
MEAN CORPUSCULAR HEMOGLOBIN: 31.8 pg (ref 25.9–32.4)
MEAN CORPUSCULAR VOLUME: 88.8 fL (ref 77.6–95.7)
MEAN PLATELET VOLUME: 8.4 fL (ref 6.8–10.7)
MONOCYTES ABSOLUTE COUNT: 0.5 10*9/L (ref 0.3–0.8)
MONOCYTES RELATIVE PERCENT: 8.9 %
NEUTROPHILS ABSOLUTE COUNT: 3 10*9/L (ref 1.8–7.8)
NEUTROPHILS RELATIVE PERCENT: 57.2 %
NUCLEATED RED BLOOD CELLS: 0 /100{WBCs} (ref <=4)
PLATELET COUNT: 285 10*9/L (ref 150–450)
RED BLOOD CELL COUNT: 3.59 10*12/L — ABNORMAL LOW (ref 3.95–5.13)
RED CELL DISTRIBUTION WIDTH: 13.2 % (ref 12.2–15.2)
WBC ADJUSTED: 5.3 10*9/L (ref 3.6–11.2)

## 2021-04-25 LAB — ALT: ALT (SGPT): 9 U/L — ABNORMAL LOW (ref 10–49)

## 2021-04-25 LAB — BUN: BLOOD UREA NITROGEN: 18 mg/dL (ref 9–23)

## 2021-04-25 LAB — C-REACTIVE PROTEIN: C-REACTIVE PROTEIN: 6 mg/L (ref <=10.0)

## 2021-04-25 LAB — CREATININE
CREATININE: 1.09 mg/dL — ABNORMAL HIGH
EGFR CKD-EPI (2021) FEMALE: 64 mL/min/{1.73_m2} (ref >=60)

## 2021-04-25 LAB — AST: AST (SGOT): 14 U/L (ref <=34)

## 2021-04-25 LAB — SEDIMENTATION RATE: ERYTHROCYTE SEDIMENTATION RATE: 45 mm/h — ABNORMAL HIGH (ref 0–20)

## 2021-04-25 MED ADMIN — ondansetron (ZOFRAN-ODT) disintegrating tablet 8 mg: 8 mg | ORAL | @ 17:00:00 | Stop: 2021-04-25

## 2021-04-25 MED ADMIN — cetirizine (ZyrTEC) tablet 10 mg: 10 mg | ORAL | @ 17:00:00 | Stop: 2021-04-25

## 2021-04-25 MED ADMIN — acetaminophen (TYLENOL) tablet 650 mg: 650 mg | ORAL | @ 17:00:00 | Stop: 2021-04-25

## 2021-04-25 MED ADMIN — inFLIXimab-abda (RENFLEXIS) 10 mg/kg = 1,800 mg in sodium chloride (NS) 500 mL IVPB: 10 mg/kg | INTRAVENOUS | @ 18:00:00 | Stop: 2021-04-25

## 2021-04-25 MED ADMIN — heparin, porcine (PF) 100 unit/mL injection 500 Units: 500 [IU] | INTRAVENOUS | @ 19:00:00 | Stop: 2021-04-25

## 2021-04-25 NOTE — Unmapped (Signed)
1300 Patient presents for accelerated Remicade (infliximab) infusion.  In no acute distress.  Vitals stable.  Reports no new medical issues or S/S of infection.  R chest port accessed with 20g x 3/4 needle with brisk blood return.  Blood drawn and sent to lab.  DSD applied per policy.  See MAR for premeds.    1345 Remicade (infliximab) 1,800 mg in 500 ml started to infuse as follows:     200 ml/hr x 15 min then 600 ml/hr for remainder of infusion.    1458  Remicade (infliximab) infusion complete.  PIV flushed with NS.  Vitals stable.  Patient without any s/s of adverse reaction. B/p high today and pt states that she is retaining a lot of fluid and this is what happens.  Denies any vision difficulties, headache or problems.  IV d/c'd.  Patient discharged from Infusion Center.

## 2021-05-18 DIAGNOSIS — L732 Hidradenitis suppurativa: Principal | ICD-10-CM

## 2021-05-23 ENCOUNTER — Ambulatory Visit: Admit: 2021-05-23 | Discharge: 2021-05-24 | Payer: BLUE CROSS/BLUE SHIELD

## 2021-05-23 MED ADMIN — cetirizine (ZyrTEC) tablet 10 mg: 10 mg | ORAL | @ 18:00:00 | Stop: 2021-05-23

## 2021-05-23 MED ADMIN — ondansetron (ZOFRAN-ODT) disintegrating tablet 8 mg: 8 mg | ORAL | @ 18:00:00 | Stop: 2021-05-23

## 2021-05-23 MED ADMIN — inFLIXimab-abda (RENFLEXIS) 10 mg/kg = 1,800 mg in sodium chloride (NS) 500 mL IVPB: 10 mg/kg | INTRAVENOUS | @ 18:00:00 | Stop: 2021-05-23

## 2021-05-23 MED ADMIN — acetaminophen (TYLENOL) tablet 650 mg: 650 mg | ORAL | @ 18:00:00 | Stop: 2021-05-23

## 2021-05-23 NOTE — Unmapped (Signed)
Pt presents for accelerated Remicade( Renflexis).  Pt denies any recent infection, VSS.  IV placed, labs drawn, premeds administered.  Pt aware of potential reaction/side effects, call bell within reach.    1419 Remicade( Renflexis) 1800 mg started at the following rates:  233ml/hr for 15 min  61ml/hr for rest of infusion.  1525 Infusion complete. Pt tolerated without complication, VSS.  IV flushed per policy and d/c'd, gauze and coban applied.  Pt left clinic in no acute distress.

## 2021-06-12 DIAGNOSIS — L304 Erythema intertrigo: Principal | ICD-10-CM

## 2021-06-12 DIAGNOSIS — L732 Hidradenitis suppurativa: Principal | ICD-10-CM

## 2021-06-12 MED ORDER — CLINDAMYCIN HCL 300 MG CAPSULE
ORAL_CAPSULE | 6 refills | 0.00000 days
Start: 2021-06-12 — End: ?

## 2021-06-12 MED ORDER — FLUCONAZOLE 150 MG TABLET
ORAL_TABLET | 5 refills | 0.00000 days
Start: 2021-06-12 — End: ?

## 2021-06-13 MED ORDER — FLUCONAZOLE 150 MG TABLET
ORAL_TABLET | 5 refills | 0.00000 days | Status: CP
Start: 2021-06-13 — End: ?

## 2021-06-13 MED ORDER — CLINDAMYCIN HCL 300 MG CAPSULE
ORAL_CAPSULE | 6 refills | 0.00000 days | Status: CP
Start: 2021-06-13 — End: ?

## 2021-06-26 ENCOUNTER — Ambulatory Visit: Admit: 2021-06-26 | Discharge: 2021-06-27 | Payer: BLUE CROSS/BLUE SHIELD

## 2021-06-26 LAB — CBC W/ AUTO DIFF
BASOPHILS ABSOLUTE COUNT: 0.1 10*9/L (ref 0.0–0.1)
BASOPHILS RELATIVE PERCENT: 1.4 %
EOSINOPHILS ABSOLUTE COUNT: 0.6 10*9/L — ABNORMAL HIGH (ref 0.0–0.5)
EOSINOPHILS RELATIVE PERCENT: 9.7 %
HEMATOCRIT: 39 % (ref 34.0–44.0)
HEMOGLOBIN: 13.3 g/dL (ref 11.3–14.9)
LYMPHOCYTES ABSOLUTE COUNT: 1.5 10*9/L (ref 1.1–3.6)
LYMPHOCYTES RELATIVE PERCENT: 24.6 %
MEAN CORPUSCULAR HEMOGLOBIN CONC: 34 g/dL (ref 32.0–36.0)
MEAN CORPUSCULAR HEMOGLOBIN: 30.7 pg (ref 25.9–32.4)
MEAN CORPUSCULAR VOLUME: 90.4 fL (ref 77.6–95.7)
MEAN PLATELET VOLUME: 9.6 fL (ref 6.8–10.7)
MONOCYTES ABSOLUTE COUNT: 0.4 10*9/L (ref 0.3–0.8)
MONOCYTES RELATIVE PERCENT: 7 %
NEUTROPHILS ABSOLUTE COUNT: 3.5 10*9/L (ref 1.8–7.8)
NEUTROPHILS RELATIVE PERCENT: 57.3 %
PLATELET COUNT: 298 10*9/L (ref 150–450)
RED BLOOD CELL COUNT: 4.32 10*12/L (ref 3.95–5.13)
RED CELL DISTRIBUTION WIDTH: 13.3 % (ref 12.2–15.2)
WBC ADJUSTED: 6.1 10*9/L (ref 3.6–11.2)

## 2021-06-26 LAB — C-REACTIVE PROTEIN: C-REACTIVE PROTEIN: 13 mg/L — ABNORMAL HIGH (ref <=10.0)

## 2021-06-26 LAB — AST: AST (SGOT): 22 U/L (ref <=34)

## 2021-06-26 LAB — BUN: BLOOD UREA NITROGEN: 26 mg/dL — ABNORMAL HIGH (ref 9–23)

## 2021-06-26 LAB — CREATININE
CREATININE: 0.89 mg/dL — ABNORMAL HIGH
EGFR CKD-EPI (2021) FEMALE: 82 mL/min/{1.73_m2} (ref >=60)

## 2021-06-26 LAB — SEDIMENTATION RATE: ERYTHROCYTE SEDIMENTATION RATE: 64 mm/h — ABNORMAL HIGH (ref 0–20)

## 2021-06-26 LAB — ALT: ALT (SGPT): 19 U/L (ref 10–49)

## 2021-06-26 MED ADMIN — heparin, porcine (PF) 100 unit/mL injection 500 Units: 500 [IU] | INTRAVENOUS | @ 22:00:00 | Stop: 2021-06-26

## 2021-06-26 MED ADMIN — inFLIXimab-abda (RENFLEXIS) 10 mg/kg = 1,700 mg in sodium chloride (NS) 500 mL IVPB: 10 mg/kg | INTRAVENOUS | @ 20:00:00 | Stop: 2021-06-26

## 2021-06-26 MED ADMIN — cetirizine (ZyrTEC) tablet 10 mg: 10 mg | ORAL | @ 20:00:00 | Stop: 2021-06-26

## 2021-06-26 MED ADMIN — ondansetron (ZOFRAN-ODT) disintegrating tablet 8 mg: 8 mg | ORAL | @ 20:00:00 | Stop: 2021-06-26

## 2021-06-26 MED ADMIN — acetaminophen (TYLENOL) tablet 650 mg: 650 mg | ORAL | @ 20:00:00 | Stop: 2021-06-26

## 2021-06-26 NOTE — Unmapped (Signed)
Patient presents for accelerated Renflexis (infliximab-abda) infusion.  In no acute distress.  Vitals stable.  Reports no new medical issues or S/S of infection. Right chest port accessed via 20g x 3/4in huber needle, brisk blood return noted.  See MAR for premeds.    1528 Renflexis (infliximab-abda) 1700 mg started to infuse as follows:     200 ml/hr x 15 min then 600 ml/hr for remainder of infusion.    1630 Renflexis (infliximab-abda) infusion complete.  Vitals stable.  Patient without any s/s of adverse reaction. Port flushed with NS and heparin per protocol, deaccessed and gauze and bandaid applied. Patient discharged from Infusion Center.

## 2021-07-02 NOTE — Unmapped (Unsigned)
Dermatology Note     Assessment and Plan:      Hidradenitis Suppurativa, Hurley 3  High risk medication use: remicade, Cosentyx   - We discussed the typical natural history, pathogenesis, treatment options, and expected course as well as the relapsing and sometimes recalcitrant nature of the disease.    - Discussed option of small surgical procedure in the future, but patient is satisfied with current regimen.  - Based on discussion above and r/b/a reviewed, mutual decision to proceed with current regimen as follows:  - Continue infliximab 10mg /kg q4??weeks.?? Discussed risks of tuberculosis, other uncommon infections, theoretical risk of malignancies, and other uncommon side effects. Could consider increasing frequency again in the future if she continues to flare around the 4wk mark  ????????????????????????- Monitoring labs reviewed. ??Continue to monitor every 8 weeks.  - Continue??finasteride 5mg  once daily  - Continue??clindamycin 300mg  twice daily  - Continue secukinumab (COSENTYX) 150 mg/mL Inject 2 mL (300 mg total) under the skin every fourteen (14) days. Maintenance doses.   ??  Psoriasis  - Continue??clobetasol 0.05% ointment applied to affected areas twice daily as needed. ??Appropriate use and side effects discussed. For legs  -??For inverse psoriasis:??Continue triamcinolone 0.1% ointment applied to affected area twice daily as needed????    The patient was advised to call for an appointment should any new, changing, or symptomatic lesions develop.     RTC: No follow-ups on file. or sooner as needed   _________________________________________________________________      Chief Complaint     Follow-up of HS and psoriasis    HPI     Summer Russell is a 45 y.o. female who presents as a returning patient (last seen by Dr. Janyth Contes on 01/16/2021) to [facility] for follow-up of hidradenitis suppurativa and psoriasis. At last visit, patient was to continue infliximab 10 mg/kg every 4 weeks, finasteride 5 mg daily, and clindamycin 300 mg twice daily, and Cosentyx 300 mg every 14 days for treatment of HS. For psoriasis, she was to continue clobetasol 0.05% ointment and triamcinolone 0.1% ointment.    ***    The patient denies any other new or changing lesions or areas of concern.     Pertinent Past Medical History     Disease course:  Year when symptoms first noticed: age 70  Year of diagnosis: age 23  Who diagnosed you? other ER Doctor  Location of first symptoms: axillae  Typical involved areas include: groin, axillae, inframammary, inner thighs and buttocks  Typical number of inflammatory lesions each month at baseline (from first visit): 5-10  Disease triggers: stress, sweat, heat, exercise and menstrual cycle  ??  Are menstrual cycles irregular when not on birth control? Yes.  Current form of contraception: no  Effect of hormonal contraception on disease: no change  Flaring with menstrual cycle (before, during, or after?): before  Difficulty becoming pregnancy? Yes.  Pregnancy complications? miscarriage ??How many children? 0  Better or worse with pregnancy? ??never pregnant. ??If so, worse during a specific trimester? never pregnant????  ??  Prior treatments:  Topical: dovonex  Systemic: antibiotics (oral and IV), humira, Remicade  Past surgical procedures: groin, scalp  Past laser procedures: none    Family History:   Negative for melanoma    Past Medical History, Family History, Social History, Medication List, Allergies, and Problem List were reviewed in the rooming section of Epic.     ROS: Other than symptoms mentioned in the HPI, no fevers, chills, or other skin complaints  Physical Examination     Gen: Well-appearing patient, appropriate, interactive, in no acute distress  Skin: Examination of the scalp, face, neck, chest, back, abdomen, bilateral upper and lower extremities, hands, palms, soles, nails, buttocks, and external genitalia performed today and pertinent for:     location Abscess Inflamed nodule Non-inflamed nodule Draining sinus Non-draining Sinus Hurley % scar   R axilla          L axilla          R inframammary          L inframammary          Intermammary          Pubic          R inguinal          R thigh          L inguinal          L thigh          Scrotum/Vulva          Perianal          R buttock          L buttock          Other (list)                      AN count (total sum of abscess and inflammatory nodule): ***    Scribe's Attestation: Elsie Stain, MD obtained and performed the history, physical exam and medical decision making elements that were entered into the chart.  Signed by Minette Brine, Scribe, on ***.    {*** NOTE TO PROVIDER: PLEASE ADD ATTESTATION NOTING YOU AGREE WITH SCRIBE DOCUMENTATION}         (Approved Template 04/18/2020)

## 2021-07-18 DIAGNOSIS — L732 Hidradenitis suppurativa: Principal | ICD-10-CM

## 2021-07-24 ENCOUNTER — Ambulatory Visit: Admit: 2021-07-24 | Discharge: 2021-07-25 | Payer: BLUE CROSS/BLUE SHIELD

## 2021-07-24 ENCOUNTER — Ambulatory Visit: Admit: 2021-07-24 | Payer: BLUE CROSS/BLUE SHIELD

## 2021-07-24 ENCOUNTER — Encounter: Admit: 2021-07-24 | Payer: BLUE CROSS/BLUE SHIELD

## 2021-07-24 ENCOUNTER — Ambulatory Visit
Admit: 2021-07-24 | Discharge: 2021-08-03 | Disposition: A | Discharge status: Rehabilitation Facility | Payer: BLUE CROSS/BLUE SHIELD | Source: Ambulatory Visit

## 2021-07-24 ENCOUNTER — Ambulatory Visit: Admit: 2021-07-24 | Discharge: 2021-08-03 | Payer: BLUE CROSS/BLUE SHIELD

## 2021-07-24 LAB — URINALYSIS
BACTERIA: NONE SEEN /HPF
BILIRUBIN UA: NEGATIVE
GLUCOSE UA: >1000 — AB
LEUKOCYTE ESTERASE UA: NEGATIVE
NITRITE UA: NEGATIVE
PH UA: 6 (ref 5.0–9.0)
PROTEIN UA: >600 — AB
RBC UA: 3 /HPF (ref <=4)
SPECIFIC GRAVITY UA: 1.034 — ABNORMAL HIGH (ref 1.003–1.030)
SQUAMOUS EPITHELIAL: 1 /HPF (ref 0–5)
UROBILINOGEN UA: <2
WBC UA: 4 /HPF (ref 0–5)

## 2021-07-24 LAB — BLOOD GAS CRITICAL CARE PANEL, VENOUS
BASE EXCESS VENOUS: -4.5 — ABNORMAL LOW (ref -2.0–2.0)
CALCIUM IONIZED VENOUS (MG/DL): 4.84 mg/dL (ref 4.40–5.40)
GLUCOSE WHOLE BLOOD: 450 mg/dL — ABNORMAL HIGH (ref 70–179)
HCO3 VENOUS: 21 mmol/L — ABNORMAL LOW (ref 22–27)
HEMOGLOBIN BLOOD GAS: 14.7 g/dL
LACTATE BLOOD VENOUS: 4.9 mmol/L (ref 0.5–1.8)
O2 SATURATION VENOUS: 66.7 % (ref 40.0–85.0)
PCO2 VENOUS: 41 mmHg (ref 40–60)
PH VENOUS: 7.32 (ref 7.32–7.43)
PO2 VENOUS: 37 mmHg (ref 30–55)
POTASSIUM WHOLE BLOOD: 4.6 mmol/L (ref 3.4–4.6)
SODIUM WHOLE BLOOD: 134 mmol/L — ABNORMAL LOW (ref 135–145)

## 2021-07-24 LAB — COMPREHENSIVE METABOLIC PANEL
ALBUMIN: 2.3 g/dL — ABNORMAL LOW (ref 3.4–5.0)
ALKALINE PHOSPHATASE: 55 U/L (ref 46–116)
ALT (SGPT): 21 U/L (ref 10–49)
ANION GAP: 12 mmol/L (ref 5–14)
AST (SGOT): 24 U/L (ref <=34)
BILIRUBIN TOTAL: 0.4 mg/dL (ref 0.3–1.2)
BLOOD UREA NITROGEN: 16 mg/dL (ref 9–23)
BUN / CREAT RATIO: 16
CALCIUM: 8.9 mg/dL (ref 8.7–10.4)
CHLORIDE: 100 mmol/L (ref 98–107)
CO2: 18 mmol/L — ABNORMAL LOW (ref 20.0–31.0)
CREATININE: 0.99 mg/dL — ABNORMAL HIGH
EGFR CKD-EPI (2021) FEMALE: 72 mL/min/{1.73_m2} (ref >=60)
GLUCOSE RANDOM: 426 mg/dL — ABNORMAL HIGH (ref 70–179)
POTASSIUM: 4.6 mmol/L (ref 3.4–4.8)
PROTEIN TOTAL: 6 g/dL (ref 5.7–8.2)
SODIUM: 130 mmol/L — ABNORMAL LOW (ref 135–145)

## 2021-07-24 LAB — CBC W/ AUTO DIFF
BASOPHILS ABSOLUTE COUNT: 0.1 10*9/L (ref 0.0–0.1)
BASOPHILS RELATIVE PERCENT: 2.2 %
EOSINOPHILS ABSOLUTE COUNT: 0.3 10*9/L (ref 0.0–0.5)
EOSINOPHILS RELATIVE PERCENT: 5.8 %
HEMATOCRIT: 36.7 % (ref 34.0–44.0)
HEMOGLOBIN: 12.5 g/dL (ref 11.3–14.9)
LYMPHOCYTES ABSOLUTE COUNT: 1.3 10*9/L (ref 1.1–3.6)
LYMPHOCYTES RELATIVE PERCENT: 26.7 %
MEAN CORPUSCULAR HEMOGLOBIN CONC: 34 g/dL (ref 32.0–36.0)
MEAN CORPUSCULAR HEMOGLOBIN: 30.7 pg (ref 25.9–32.4)
MEAN CORPUSCULAR VOLUME: 90.3 fL (ref 77.6–95.7)
MEAN PLATELET VOLUME: 8.7 fL (ref 6.8–10.7)
MONOCYTES ABSOLUTE COUNT: 0.4 10*9/L (ref 0.3–0.8)
MONOCYTES RELATIVE PERCENT: 9.1 %
NEUTROPHILS ABSOLUTE COUNT: 2.7 10*9/L (ref 1.8–7.8)
NEUTROPHILS RELATIVE PERCENT: 56.2 %
PLATELET COUNT: 275 10*9/L (ref 150–450)
RED BLOOD CELL COUNT: 4.07 10*12/L (ref 3.95–5.13)
RED CELL DISTRIBUTION WIDTH: 14 % (ref 12.2–15.2)
WBC ADJUSTED: 4.9 10*9/L (ref 3.6–11.2)

## 2021-07-24 LAB — LACTATE, VENOUS, WHOLE BLOOD: LACTATE BLOOD VENOUS: 5.8 mmol/L (ref 0.5–1.8)

## 2021-07-24 LAB — HIGH SENSITIVITY TROPONIN I - SINGLE: HIGH SENSITIVITY TROPONIN I: 19 ng/L (ref <=34)

## 2021-07-24 LAB — B-TYPE NATRIURETIC PEPTIDE: B-TYPE NATRIURETIC PEPTIDE: 62 pg/mL (ref <=100)

## 2021-07-24 MED ADMIN — cetirizine (ZyrTEC) tablet 10 mg: 10 mg | ORAL | @ 18:00:00 | Stop: 2021-07-24

## 2021-07-24 MED ADMIN — ondansetron (ZOFRAN-ODT) disintegrating tablet 8 mg: 8 mg | ORAL | @ 18:00:00 | Stop: 2021-07-24

## 2021-07-24 MED ADMIN — inFLIXimab-abda (RENFLEXIS) 10 mg/kg = 1,700 mg in sodium chloride (NS) 500 mL IVPB: 10 mg/kg | INTRAVENOUS | @ 19:00:00 | Stop: 2021-07-24

## 2021-07-24 MED ADMIN — acetaminophen (TYLENOL) tablet 650 mg: 650 mg | ORAL | @ 18:00:00 | Stop: 2021-07-24

## 2021-07-24 NOTE — Unmapped (Signed)
Patient presents for accelerated Renflexis (infliximab-abda) infusion.  In no acute distress. BP elevated but patient states it is usually high at beginning of infusion.  Reports no new medical issues or S/S of infection. Right sided chest port accessed with 20 x 3/4 in huber needle. Good blood return and flushes easily.  See MAR for premeds.    1341 Renflexis (infliximab-abda) 1700 mg started to infuse as follows:     200 ml/hr x 15 min then 600 ml/hr for remainder of infusion.    1441 Renflexis (infliximab-abda) infusion complete.    Vitals stable. Port flushed per policy and deaccessed.    1520 Patient's caregiver came to this nurse and stated patient was on toilet and was unable to get up. Patient states she is feeling very weak and unwell. Patient assisted back to wheelchair with assistance X 3. Since patient is feeling worse than before, attempted to schedule appt with same day clinic but no availability. EMS called. Port reaccessed.     1640 Patient picked up by EMS to be taken to ED.

## 2021-07-25 LAB — SODIUM, URINE, RANDOM: SODIUM URINE: 30 mmol/L

## 2021-07-25 LAB — C3 COMPLEMENT: C3 COMPLEMENT: 103 mg/dL (ref 90–170)

## 2021-07-25 LAB — BASIC METABOLIC PANEL
ANION GAP: 12 mmol/L (ref 5–14)
BLOOD UREA NITROGEN: 29 mg/dL — ABNORMAL HIGH (ref 9–23)
BUN / CREAT RATIO: 21
CALCIUM: 8.5 mg/dL — ABNORMAL LOW (ref 8.7–10.4)
CHLORIDE: 99 mmol/L (ref 98–107)
CO2: 21 mmol/L (ref 20.0–31.0)
CREATININE: 1.39 mg/dL — ABNORMAL HIGH
EGFR CKD-EPI (2021) FEMALE: 48 mL/min/{1.73_m2} — ABNORMAL LOW (ref >=60)
GLUCOSE RANDOM: 275 mg/dL — ABNORMAL HIGH (ref 70–179)
POTASSIUM: 4.4 mmol/L (ref 3.5–5.1)
SODIUM: 132 mmol/L — ABNORMAL LOW (ref 135–145)

## 2021-07-25 LAB — COMPREHENSIVE METABOLIC PANEL
ALBUMIN: 2.1 g/dL — ABNORMAL LOW (ref 3.4–5.0)
ALKALINE PHOSPHATASE: 51 U/L (ref 46–116)
ALT (SGPT): 20 U/L (ref 10–49)
ANION GAP: 11 mmol/L (ref 5–14)
AST (SGOT): 25 U/L (ref <=34)
BILIRUBIN TOTAL: 0.3 mg/dL (ref 0.3–1.2)
BLOOD UREA NITROGEN: 21 mg/dL (ref 9–23)
BUN / CREAT RATIO: 14
CALCIUM: 8.4 mg/dL — ABNORMAL LOW (ref 8.7–10.4)
CHLORIDE: 100 mmol/L (ref 98–107)
CO2: 23 mmol/L (ref 20.0–31.0)
CREATININE: 1.49 mg/dL — ABNORMAL HIGH
EGFR CKD-EPI (2021) FEMALE: 44 mL/min/{1.73_m2} — ABNORMAL LOW (ref >=60)
GLUCOSE RANDOM: 260 mg/dL — ABNORMAL HIGH (ref 70–179)
POTASSIUM: 4.5 mmol/L (ref 3.4–4.8)
PROTEIN TOTAL: 5.3 g/dL — ABNORMAL LOW (ref 5.7–8.2)
SODIUM: 134 mmol/L — ABNORMAL LOW (ref 135–145)

## 2021-07-25 LAB — TOXICOLOGY SCREEN, URINE
AMPHETAMINE SCREEN URINE: NEGATIVE
BARBITURATE SCREEN URINE: NEGATIVE
BENZODIAZEPINE SCREEN, URINE: NEGATIVE
BUPRENORPHINE, URINE SCREEN: NEGATIVE
CANNABINOID SCREEN URINE: NEGATIVE
COCAINE(METAB.)SCREEN, URINE: NEGATIVE
FENTANYL SCREEN, URINE: NEGATIVE
METHADONE SCREEN, URINE: NEGATIVE
OPIATE SCREEN URINE: NEGATIVE
OXYCODONE SCREEN URINE: NEGATIVE

## 2021-07-25 LAB — PROTEIN / CREATININE RATIO, URINE
CREATININE, URINE: 101.3 mg/dL
PROTEIN URINE: 1431.8 mg/dL
PROTEIN/CREAT RATIO, URINE: 14.134

## 2021-07-25 LAB — CBC
HEMATOCRIT: 31.3 % — ABNORMAL LOW (ref 34.0–44.0)
HEMOGLOBIN: 11 g/dL — ABNORMAL LOW (ref 11.3–14.9)
MEAN CORPUSCULAR HEMOGLOBIN CONC: 35.2 g/dL (ref 32.0–36.0)
MEAN CORPUSCULAR HEMOGLOBIN: 31.5 pg (ref 25.9–32.4)
MEAN CORPUSCULAR VOLUME: 89.6 fL (ref 77.6–95.7)
MEAN PLATELET VOLUME: 8.4 fL (ref 6.8–10.7)
PLATELET COUNT: 269 10*9/L (ref 150–450)
RED BLOOD CELL COUNT: 3.49 10*12/L — ABNORMAL LOW (ref 3.95–5.13)
RED CELL DISTRIBUTION WIDTH: 13.5 % (ref 12.2–15.2)
WBC ADJUSTED: 5.2 10*9/L (ref 3.6–11.2)

## 2021-07-25 LAB — LIPID PANEL
CHOLESTEROL/HDL RATIO SCREEN: 9.2 — ABNORMAL HIGH (ref 1.0–4.5)
CHOLESTEROL: 258 mg/dL — ABNORMAL HIGH (ref <=200)
HDL CHOLESTEROL: 28 mg/dL — ABNORMAL LOW (ref 40–60)
NON-HDL CHOLESTEROL: 230 mg/dL — ABNORMAL HIGH (ref 70–130)
TRIGLYCERIDES: 1789 mg/dL — ABNORMAL HIGH (ref 0–150)

## 2021-07-25 LAB — ALBUMIN / CREATININE URINE RATIO
ALBUMIN QUANT URINE: 781.2 mg/dL
ALBUMIN/CREATININE RATIO: 7643.8 ug/mg — ABNORMAL HIGH (ref 0.0–30.0)
CREATININE, URINE: 102.2 mg/dL

## 2021-07-25 LAB — LACTATE, VENOUS, WHOLE BLOOD: LACTATE BLOOD VENOUS: 2.4 mmol/L — ABNORMAL HIGH (ref 0.5–1.8)

## 2021-07-25 LAB — LIPASE: LIPASE: 36 U/L (ref 12–53)

## 2021-07-25 LAB — HEMOGLOBIN A1C
ESTIMATED AVERAGE GLUCOSE: 266 mg/dL
HEMOGLOBIN A1C: 10.9 % — ABNORMAL HIGH (ref 4.8–5.6)

## 2021-07-25 LAB — C4 COMPLEMENT: C4 COMPLEMENT: 9.1 mg/dL — ABNORMAL LOW (ref 12.0–36.0)

## 2021-07-25 LAB — CREATININE, URINE: CREATININE, URINE: 95 mg/dL

## 2021-07-25 LAB — POTASSIUM, URINE, RANDOM: POTASSIUM URINE: 47.4 mmol/L

## 2021-07-25 LAB — UREA NITROGEN, URINE: UREA NITROGEN URINE: 449 mg/dL

## 2021-07-25 LAB — ETHANOL: ETHANOL: <10 mg/dL (ref <=10.0)

## 2021-07-25 LAB — HCG QUANTITATIVE, BLOOD: GONADOTROPIN, CHORIONIC (HCG) QUANT: <2.6 m[IU]/mL

## 2021-07-25 MED ADMIN — lactated ringers bolus 1,000 mL: 1000 mL | INTRAVENOUS | @ 03:00:00 | Stop: 2021-07-24

## 2021-07-25 MED ADMIN — atorvastatin (LIPITOR) tablet 20 mg: 20 mg | ORAL | @ 13:00:00

## 2021-07-25 MED ADMIN — clindamycin (CLEOCIN) capsule 300 mg: 300 mg | ORAL | @ 21:00:00 | Stop: 2021-08-08

## 2021-07-25 MED ADMIN — cefTRIAXone (ROCEPHIN) 1 g in sodium chloride 0.9 % (NS) 100 mL IVPB-connector bag: 1 g | INTRAVENOUS | @ 02:00:00 | Stop: 2021-07-24

## 2021-07-25 MED ADMIN — iohexoL (OMNIPAQUE) 350 mg iodine/mL solution 75 mL: 75 mL | INTRAVENOUS | @ 05:00:00 | Stop: 2021-07-24

## 2021-07-25 MED ADMIN — vancomycin (VANCOCIN) 2,750 mg in sodium chloride (NS) 0.9 % 500 mL IVPB: 2750 mg | INTRAVENOUS | @ 10:00:00 | Stop: 2021-07-25

## 2021-07-25 MED ADMIN — ondansetron (ZOFRAN) injection 4 mg: 4 mg | INTRAVENOUS | @ 23:00:00

## 2021-07-25 MED ADMIN — enoxaparin (LOVENOX) syringe 40 mg: 40 mg | SUBCUTANEOUS | @ 13:00:00

## 2021-07-25 MED ADMIN — levothyroxine (SYNTHROID) tablet 50 mcg: 50 ug | ORAL | @ 13:00:00

## 2021-07-25 MED ADMIN — clindamycin (CLEOCIN) capsule 300 mg: 300 mg | ORAL | @ 13:00:00 | Stop: 2021-08-08

## 2021-07-25 MED ADMIN — pantoprazole (PROTONIX) EC tablet 40 mg: 40 mg | ORAL | @ 13:00:00

## 2021-07-25 MED ADMIN — diclofenac sodium (VOLTAREN) 1 % gel 2 g: 2 g | TOPICAL | @ 21:00:00

## 2021-07-25 MED ADMIN — finasteride (PROSCAR) tablet 5 mg: 5 mg | ORAL | @ 13:00:00

## 2021-07-25 MED ADMIN — levothyroxine (SYNTHROID) tablet 25 mcg: 25 ug | ORAL | @ 13:00:00

## 2021-07-25 MED ADMIN — aspirin chewable tablet 81 mg: 81 mg | ORAL | @ 13:00:00

## 2021-07-25 MED ADMIN — insulin NPH (HumuLIN,NovoLIN) injection 15 Units: 15 [IU] | SUBCUTANEOUS | @ 13:00:00

## 2021-07-25 MED ADMIN — cetirizine (ZyrTEC) tablet 10 mg: 10 mg | ORAL | @ 13:00:00

## 2021-07-25 MED ADMIN — cholecalciferol (vitamin D3 25 mcg (1,000 units)) tablet 25 mcg: 25 ug | ORAL | @ 13:00:00

## 2021-07-25 MED ADMIN — cefepime (MAXIPIME) 2 g in sodium chloride 0.9 % (NS) 100 mL IVPB-connector bag: 2 g | INTRAVENOUS | @ 09:00:00 | Stop: 2021-08-01

## 2021-07-25 MED ADMIN — diclofenac sodium (VOLTAREN) 1 % gel 2 g: 2 g | TOPICAL | @ 15:00:00

## 2021-07-25 MED ADMIN — insulin lispro (HumaLOG) injection 0-20 Units: 0-20 [IU] | SUBCUTANEOUS | @ 21:00:00

## 2021-07-25 MED ADMIN — acetaminophen (TYLENOL) tablet 1,000 mg: 1000 mg | ORAL | @ 23:00:00

## 2021-07-25 MED ADMIN — iohexoL (OMNIPAQUE) 350 mg iodine/mL solution 100 mL: 100 mL | INTRAVENOUS | @ 17:00:00 | Stop: 2021-07-25

## 2021-07-25 MED ADMIN — acetaminophen (TYLENOL) tablet 1,000 mg: 1000 mg | ORAL | @ 04:00:00 | Stop: 2021-07-24

## 2021-07-25 MED ADMIN — sulfur hexafluoride microsphreres (LUMASON) intravenous suspension 2 mL: 2 mL | INTRAVENOUS | @ 20:00:00 | Stop: 2021-07-25

## 2021-07-25 MED ADMIN — acetaminophen (TYLENOL) tablet 1,000 mg: 1000 mg | ORAL | @ 12:00:00

## 2021-07-25 MED ADMIN — insulin NPH (HumuLIN,NovoLIN) injection 15 Units: 15 [IU] | SUBCUTANEOUS | @ 23:00:00

## 2021-07-25 MED ADMIN — insulin lispro (HumaLOG) injection 0-20 Units: 0-20 [IU] | SUBCUTANEOUS | @ 16:00:00

## 2021-07-25 MED ADMIN — azithromycin (ZITHROMAX) 500 mg in sodium chloride (NS) 0.9 % 250 mL IVPB VIALMATE: 500 mg | INTRAVENOUS | @ 04:00:00

## 2021-07-25 MED ADMIN — lactated ringers bolus 1,000 mL: 1000 mL | INTRAVENOUS | @ 10:00:00 | Stop: 2021-07-25

## 2021-07-25 MED ADMIN — cefepime (MAXIPIME) 2 g in sodium chloride 0.9 % (NS) 100 mL IVPB-connector bag: 2 g | INTRAVENOUS | @ 16:00:00 | Stop: 2021-08-01

## 2021-07-25 NOTE — Unmapped (Signed)
Internal Medicine (MEDW) History & Physical    Assessment & Plan:   Summer Russell is a 45 y.o. 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    Principal Problem:    Lactic acidosis  Active Problems:    Hypertension    Hyperglycemia due to type 2 diabetes mellitus (CMS-HCC)    Proteinuria    CKD (chronic kidney disease)    Hyponatremia    Axillary hidradenitis suppurativa    Psoriasis    Hypothyroidism    Hyperlipidemia  Resolved Problems:    * No resolved hospital problems. *    Lactic Acidosis - Weakness  On admission to the ED patient with lactate of 4.8 that has since up trended to 5.8 despite 1 L fluid repletion.  Patient without obvious signs of infection.  No leukocytosis or fever, chest x-ray clear and UA without pyuria.  It is possible that patient is not mounting fever or leukocytosis in setting of immunosuppressive therapies for arthritis and hidradenitis though this does not account for clear infectious source. Could consider HS and panniculitis as infectious sources. Patient with low albumin in setting of significant urinary losses; given this, patient could be third spacing with low intravascular fluids leading to hypoperfusion.  Additionally, metformin could be producing a lactic acidosis that is worsened in the setting of progressive renal disease.  We will treat empirically for sepsis at this time and resuscitate with an additional 1 L of fluids while holding metformin.  - S/p 1L fluids in ED redosing additional 1L   - Messaged RN to obtain repeat lactate following 2nd fluid bolus  - Empiric vanc/cefe  - F/u BCx2  - Stop metformin  - F/u EtOH level  - F/u Utox  - F/u thiamine    Hyperglycemia - Uncontrolled T2DM  On admission patient blood glucose elevated to 426 with greater than 1000 BG in urine despite stopping Jardiance several months ago.  Per patient report, she is nonadherent to many of her diabetes medication and is unsure how much she takes though she does clarify that she has consistently taken metformin for many years.  We will have to estimate insulin requirement while inpatient and reflectively uptitrate as needed.  - 15 NPH BID + SSI; uptitrate PRN  - Repeat A1C  - Stop metformin  - UA glucose >1000 mg/dL    Proteinuria - Chronic Kidney Disease  Cr 0.99 from 0.89 in 06/2021. In setting of severe proteinuria, I am concerned patient may be developing significant diabetic nephropathy. Wonder if this may be in turn contributing to poor clearance of metformin. Will fluid resuscitate as above and closely trend Cr. Pt likely to need inpt vs opt w/u of developing nephropathy.  - S/p 1L LR; ordered additional 1 L   - Daily CMP  - Consider nephro consult in AM   - Ordered UPCR  - urine protein > 600 mg/dL    Hyponatremia - Unclear etiology. Have ordered for urine lytes and serum osm though likely to be skewed by fluid resuscitation  - F/u urine osm, urine Na, serum osm  - F/u AM Na and order repeat monitoring labs PRN  - Daily BMP    Cardiomegaly - As seen on CT scan and CXR; will obtain TTE for better functional characterization, particularly in s/o extensive family history of cardiac dz.  - TTE  Hydradenitis - Managed by [facility] rheum, on iv renflexis, finasteride, clinda  - S/p Renflexis infusion 12/19  - Continue clindamycin, finasteride    Psoriasis- Patient unsure of cosentyx frequency and dosing; verify with pharmacy  Hypothyroidism - Continue home 50 every day + 25 T/Th  HTN - Holding home lisinopril 10 mg, lasix; resume as able  HLD - Continue home lipitor  Contraception - Continue home Yaz  Sleep Apnea - CPAP qhs    Daily Checklist:  Diet: Carbohydrate Restricted  DVT PPx: Lovenox 40mg  q24h  Electrolytes: No Repletion Needed  Code Status: Full Code    Chief Concern:   Lactic acidosis    Subjective:   HPI:  Summer Russell is a 45 y.o. 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    Patient presented to [facility] infusion center 12/19 for Renflexis infusion.  Following infusion she was wheeled to the bathroom and realized that she was unable to stand due to global weakness.  At baseline patient able to ambulate 6 feet with assistance.  On evaluation by EMS, patient continued to feel weak, lightheaded, and short of breath. Vitals reported as BP 297/133, HR 113 (peak 145 on standing) with normal O2 on RA. BG taken at the time notable for hyperglycemia to 462 (baseline 200s to 300s). Sent to ED for further evaluation.    In the ED patient hypertensive to 152/86 with other vitals within normal limits.  Appearing chronically ill but in no acute distress. Labs notable for sodium 130, bicarb 18, creatinine 0.99, glucose 426, and lactate 4.9.  Received 1 L of fluids in the ED with repeat lactate increased to 5.8.  Preliminary CTA negative for PE but did note cardiomegaly that was redemonstrated on chest x-ray.  No pulmonary or pulm process appreciated on imaging.  PVLs obtained in the ED negative for clot.    On my exam, patient appearing chronically ill but in no acute distress.  A&O x4 with stable vital signs. During discussion with pt, she noted that she does not regularly check her blood sugar or regularly take her insulin. She endorses usually taking long-acting insulin though she is unsure the amount. Frequently misses mealtime insulin.  Endorses compliance with long-term metformin use.  She denies chest pain associated with the event but does endorse a family h/o heart disease (father passed away 78s, one brother passed away in 41's and another in 48's, all from cardiac dz). She denies shortness of breath at present and denies any  recent history of fevers, chills, sick contact, diarrhea.  Endorses some nausea while in the ED without vomiting.  Endorses coughing up phlegm today and recalls having some sort of viral respiratory illness approximately 3 weeks ago.  Also notes increase in swelling in her legs and abdomen.  She is diagnosed with lymphedema and typically experiences swelling predominantly in the right leg and abdomen; in recent days, however, she has noticed swelling in both legs as well as worsening abdominal swelling.    Designated Healthcare Decision Maker:  Summer Russell currently has decisional capacity for healthcare decision-making and is able to designate a surrogate healthcare decision maker. Summer Russell's designated healthcare decision maker(s) is/are Caprice Kluver (the patient's spouse) as denoted by stated patient preference.    Allergies:  Venom-honey bee, Lamictal [lamotrigine], and Oxycodone    Medications:  Prior to Admission medications    Medication Dose, Route, Frequency   ascorbic Acid (VITAMIN C) 500 mg CpER 500 mg, Oral   aspirin (ECOTRIN) 81 MG tablet 81 mg, Oral, Daily (standard)   atorvastatin (LIPITOR) 20 MG tablet 20 mg, Oral, Daily (standard)   cetirizine (ZYRTEC) 10 MG tablet 10 mg, Oral, Daily (standard)   cholecalciferol, vitamin D3, 1,000 unit (25 mcg) tablet 1,000 Units, Oral   clindamycin (CLEOCIN) 300 MG capsule TAKE 1 CAPSULE BY MOUTH TWO TIMES A DAY.   finasteride (PROSCAR) 5 mg tablet TAKE 1 TABLET BY MOUTH EVERY DAY   fluconazole (DIFLUCAN) 150 MG tablet TAKE 1 TAB NOW AN THEN TAKE 1 TABLET ONCE WEEKLY AS NEEDED FOR YEAST INFECTION   furosemide (LASIX) 40 MG tablet 60 mg, Oral, Daily (standard)   levothyroxine (SYNTHROID, LEVOTHROID) 50 MCG tablet TAKE 1 TABLET DAILY ON AN EMPTY STOMACH WITH A GLASS OF WATER 30-60 MINUTES BEFORE BREAKFAST.   lisinopril (PRINIVIL,ZESTRIL) 10 MG tablet 20 mg, Oral, Daily (standard)   meloxicam (MOBIC) 15 MG tablet 15 mg, Oral, Daily (standard)   metFORMIN (GLUCOPHAGE) 500 MG tablet 1,000 mg, 2 times a day   multivitamin capsule 1 capsule, Oral, Daily (standard)   NIKKI, 28, 3-0.02 mg per tablet TAKE 1 TABLET BY MOUTH EVERY DAY   pantoprazole (PROTONIX) 40 MG tablet 40 mg, Oral   secukinumab (COSENTYX) 150 mg/mL PnIj injection Inject the contents of 2 pens (300 mg total) under the skin once a week for the first 5 weeks as loading doses   secukinumab (COSENTYX) 150 mg/mL PnIj injection Inject the contents of 2 pens (300 mg total) under the skin every fourteen (14) days as maintenance doses.  START 14 days after last loading dose.   BD INSULIN SYRINGE ULTRA-FINE 1 mL 31 gauge x 5/16 (8 mm) Syrg USE 2 (TWO) TIMES DAILY AS DIRECTED   blood sugar diagnostic Strp Frequency:PHARMDIR   Dosage:0.0     Instructions:  Note:Use to test blood sugar in the morning before breakfast and in the evening two hours after dinner, ISD-9 250.00. Dose: 1   blood-glucose meter Misc U QID UTD   empty container Misc Use as directed to dispose of Cosentyx pens.   gabapentin (NEURONTIN) 300 MG capsule 300 mg, Oral, 2 times a day (standard)   INSULIN PUMP CONTROLLER MISC Miscellaneous   lidocaine (XYLOCAINE) 5 % ointment Apply to painful areas every 3-4 hours as needed   pen needle, diabetic 31 gauge x 5/16 Ndle Use 1 needle to skin four times daily as directed   SUBCUTANEOUS INSULIN PUMP MISC Miscellaneous       Medical History:  Past Medical History:   Diagnosis Date   ??? CKD (chronic kidney disease) 07/25/2021   ??? Diabetes mellitus (CMS-HCC)    ??? Disease of thyroid gland    ??? Fibromyalgia    ??? Hydradenitis    ??? Psoriasis    ??? Psoriatic arthritis (CMS-HCC)    ??? Sleep apnea        Surgical History:  Past Surgical History:   Procedure Laterality Date   ??? IR INSERT PORT AGE GREATER THAN 5 YRS  11/06/2019    IR INSERT PORT AGE GREATER THAN 5 YRS 11/06/2019 Soledad Gerlach, MD [facility]   ??? SKIN BIOPSY     ??? TONSILLECTOMY         Family History: In addition to the history provided below, patient endorses significant family history of premature cardiac death including 2  brothers and her father.  Family History   Adopted: Yes   Problem Relation Age of Onset   ??? Diabetes Mother    ??? Hypertension Mother    ??? Diabetes Father    ??? Hypertension Father    ??? Melanoma Neg Hx    ??? Basal cell carcinoma Neg Hx    ??? Squamous cell carcinoma Neg Hx        Social History:  The patient lives with family    Social History     Tobacco Use   ??? Smoking status: Former     Packs/day: 2.00     Years: 20.00     Pack years: 40.00     Types: Cigarettes     Quit date: 2013     Years since quitting: 9.9   ??? Smokeless tobacco: Never   Substance Use Topics   ??? Alcohol use: No   ??? Drug use: No        Review of Systems:  10 systems were reviewed and are negative unless otherwise mentioned in the HPI    Objective:   Physical Exam:  Temp:  [36.2 ??C (97.1 ??F)-36.9 ??C (98.5 ??F)] 36.9 ??C (98.5 ??F)  Heart Rate:  [98-121] 112  SpO2 Pulse:  [101] 101  Resp:  [17-25] 25  BP: (127-197)/(62-133) 132/92  SpO2:  [95 %-98 %] 96 %    Gen: Morbidly obese white female appearing uncomfortable but in NAD, answers questions appropriately  Eyes: Sclera anicteric, EOMI, PERRLA,  HENT: atraumatic, normocephalic, MMM. OP w/o erythema or exudate   Neck: no cervical lymphadenopathy or thyromegaly, no JVD  Heart: RRR, S1, S2, no M/R/G, no chest wall tenderness  Lungs: CTAB, no crackles or wheezes, no use of accessory muscles  Abdomen: HS appreciated under bilateral breasts, erythema w/o tenderness appreciated on pannus, normoactive bowel sounds,  no rebound/guarding, no hepatosplenomegaly, nontender, swelling appreciated in lower abdomen  Extremities: no clubbing, cyanosis, pulses are +2 in bilateral upper and lower extremities; 2+ edema appreciated in legs bilaterally  Neuro: CN II-XI grossly intact, normal cerebellar function, normal gait. No focal deficits.  Skin:  No rashes, lesions on clothed exam  Psych: Alert, oriented, normal mood and affect.     Labs/Studies/Imaging:  Labs, Studies, Imaging from the last 24hrs per EMR and personally reviewed

## 2021-07-25 NOTE — Unmapped (Addendum)
Usually ambulatory per ems.   Port access at clinic.

## 2021-07-25 NOTE — Unmapped (Signed)
Vancomycin Therapeutic Monitoring Pharmacy Note    Summer Russell is a 45 y.o. female starting vancomycin. Date of therapy initiation: 07/25/21    Indication:   suspected infection, unknown source    Prior Dosing Information: None/new initiation     Goals:  Therapeutic Drug Levels  Vancomycin trough goal: 10-15 mg/L    Additional Clinical Monitoring/Outcomes  Renal function, volume status (intake and output)    Results: Not applicable    Wt Readings from Last 1 Encounters:   07/24/21 (!) 170.5 kg (375 lb 12.8 oz)     Creatinine   Date Value Ref Range Status   07/24/2021 0.99 (H) 0.60 - 0.80 mg/dL Final   16/05/9603 5.40 (H) 0.60 - 0.80 mg/dL Final   98/06/9146 8.29 (H) 0.60 - 0.80 mg/dL Final        Pharmacokinetic Considerations and Significant Drug Interactions:  ??? Adult (estimated initial): Vd = 121 L, ke = 0.073 hr-1  ??? Concurrent nephrotoxic meds: not applicable   ??? Weight = 170.5 kg    Assessment/Plan:  Recommendation(s)  ??? Start vancomycin 2750 mg IV x1 then 2000 mg IV q12h  ??? Estimated trough on recommended regimen: 13-14 mg/L    Follow-up  ??? Level due: prior to fourth or fifth dose  ??? A pharmacist will continue to monitor and order levels as appropriate    Please page service pharmacist with questions/clarifications.    Summer Russell, PharmD

## 2021-07-25 NOTE — Unmapped (Signed)
[facility]  Emergency Department Provider Note       ED Clinical Impression     Final diagnoses:   Weakness (Primary)   Panniculitis        Impression, Medical Decision Making, ED Course     Impression: 45 y.o. female who has a past medical history of Diabetes mellitus (CMS-HCC), Disease of thyroid gland, Fibromyalgia, Hydradenitis, Psoriasis, Psoriatic arthritis (CMS-HCC), and Sleep apnea. who presents with lightheadedness, dyspnea, and weakness as described below.    BP 152/86  - Pulse 98  - Temp 36.9 ??C (98.5 ??F) (Oral)  - Resp 18  - SpO2 97%     Initial vitals as above, tachycardic and hypertensive, afebrile.  Upon initial evaluation in the emergency department, patient is chronically ill-appearing, appears uncomfortable, but in no acute distress.  Heart tachycardic but regular rhythm, lungs clear to auscultation bilaterally.  No abdominal tenderness palpation.  Well-healing sites of hidradenitis inferior to the bilateral breasts.  Diffuse erythema in the abdominal skin folds without active purulent drainage.  Bilateral lower extremity edema, right greater than left.    DDx/MDM: 45 year old female with history as below here with lightheadedness, dyspnea, and weakness.  Differential diagnosis PE versus ACS versus pneumonia/ viral respiratory infection versus heart failure vs. urinary tract infection versus cellulitis.  Will obtain labs including CBC, CMP, lactate, blood cultures, RPP, BNP, troponin, UA to evaluate.  We will also obtain EKG, chest x-ray, CTA chest to evaluate for PE and PVLs of bilateral lower extremities to evaluate for DVT.    Diagnostic workup as below.    Orders Placed This Encounter   Procedures   ??? Blood Culture #1   ??? Blood Culture #2   ??? Respiratory Pathogen Panel with COVID-19   ??? CTA Chest W Contrast   ??? XR Chest Portable   ??? ED POCUS Chest Bilateral   ??? Comprehensive Metabolic Panel   ??? CBC w/ Differential   ??? Blood Gas Critical Care Panel, Venous   ??? hsTroponin I (single, no delta) ??? B-type Natriuretic Peptide   ??? Urinalysis   ??? Lactate, Venous, Whole Blood   ??? Cardiac Monitoring   ??? POCT Glucose - RN Obtain   ??? ECG 12 Lead       ED Course as of 07/24/21 2342   Mon Jul 24, 2021   1944 PVL Venous Duplex Lower Extremity Bilateral  Right Duplex Findings  All veins visualized appear fully compressible. Doppler flow signals demonstrate normal spontaneity, phasicity, and augmentation.  Not visualized segments included the proximal calf veins due to the noted limitations.     Left Duplex Findings  All veins visualized appear fully compressible. Doppler flow signals demonstrate normal spontaneity, phasicity, and augmentation.  Not visualized segments included the proximal calf veins due to the noted limitations. Unable to demonstrate compression at the left common femoral vein due to body habitus/anatomy; Doppler waveforms at this segment are WNL.     Right Technical Summary  No evidence of deep venous obstruction in the lower extremity. No indirect evidence of obstruction proximal to the inguinal ligament.  Not visualized segments included the proximal calf veins due to the noted limitations.     Left Technical Summary  No evidence of deep venous obstruction in the lower extremity. No indirect evidence of obstruction proximal to the inguinal ligament.  Not visualized segments included the proximal calf veins due to the noted limitations.   2056 On bedside ultrasound, no evidence of pericardial effusion, normal global cardiac function.  Given elevated lactate, treating empirically with Rocephin and Azithromycin, will order 1L fluid bolus but will hold off on further fluid replacement until we see how she tolerates the bolus. Evidence of panniculitis on abdominal exam, already empirically covered as above, also on daily Clinda prophylaxis.    2243 Will sign out patient to oncoming resident physician at 2300 pending CTA and disposition.   If CTA is negative and lactate/ blood glucose is downtrending, she can likely be discharged home. She will need a prescription for Nystatin for her yeast panniculitis        ____________________________________________    The case was discussed with the attending physician, who is in agreement with the above assessment and plan.    Dictation software was used while making this note. Please excuse any errors made with dictation software.     Additional Medical Decision Making     I have reviewed the vital signs and the nursing notes. Labs and radiology results that were available during my care of the patient were independently reviewed by me and considered in my medical decision making.     I independently visualized the EKG tracing if performed.  I independently visualized the radiology images if performed.  I reviewed the patient's prior medical records if available.  Additional history obtained from family if available.  I discussed the case with the admitting provider and the consulting services if the patient was admitted and/or consulting services were utilized.     History     Chief Complaint  Chief Complaint   Patient presents with   ??? Weakness       HPI   Summer Russell is a 45 y.o. female with past medical history as below who presents with weakness.  She was in the infusion clinic and had already received her infusion when she went to go the bathroom.  While on the toilet she had an episode of weakness and was unable to stand up on her own.  She also reports lightheadedness, nausea, mild dyspnea, but no chest pain or vomiting.  She has had diarrhea at baseline for the past year.  She has also had pain in her abdominal skin folds and is concerned that she has cellulitis.  She has had difficulty with ADLs recently and has been using cleansing wipes instead of showering and is worried that she is not getting the area clean enough.      ROS:   Constitutional: Negative for fever.  Eyes: Negative for visual changes.  ENT: Negative for sore throat.  Cardiovascular: Negative for palpitations. Negative for chest pain.  Respiratory: Per HPI  Gastrointestinal:  Per HPI  Genitourinary: Negative for dysuria.   Musculoskeletal: Negative for back pain.  Skin:  Per HPI  Neurological: Negative for headaches, focal weakness, or numbness.     All other systems have been reviewed and are negative except as otherwise documented.    Past Medical History:   Diagnosis Date   ??? Diabetes mellitus (CMS-HCC)    ??? Disease of thyroid gland    ??? Fibromyalgia    ??? Hydradenitis    ??? Psoriasis    ??? Psoriatic arthritis (CMS-HCC)    ??? Sleep apnea        Past Surgical History:   Procedure Laterality Date   ??? IR INSERT PORT AGE GREATER THAN 5 YRS  11/06/2019    IR INSERT PORT AGE GREATER THAN 5 YRS 11/06/2019 Soledad Gerlach, MD [facility]   ???  SKIN BIOPSY     ??? TONSILLECTOMY           Current Facility-Administered Medications:   ???  azithromycin (ZITHROMAX) 500 mg in sodium chloride (NS) 0.9 % 250 mL IVPB VIALMATE, 500 mg, Intravenous, Once, Raechel Ache, MD, Paused at 07/24/21 2330    Current Outpatient Medications:   ???  albiglutide 50 mg/0.5 mL PnIj, Inject 50 mg under the skin., Disp: , Rfl:   ???  alpha lipoic acid 200 mg cap, Take 200 mg by mouth., Disp: , Rfl:   ???  amoxicillin-clavulanate (AUGMENTIN) 875-125 mg per tablet, Take twice daily, Disp: 60 tablet, Rfl: 2  ???  ascorbic Acid (VITAMIN C) 500 mg CpER, Take 500 mg by mouth., Disp: , Rfl:   ???  aspirin (ECOTRIN) 81 MG tablet, Take 81 mg by mouth daily., Disp: , Rfl:   ???  atorvastatin (LIPITOR) 20 MG tablet, Take 20 mg by mouth daily., Disp: , Rfl:   ???  BD INSULIN SYRINGE ULTRA-FINE 1 mL 31 gauge x 5/16 (8 mm) Syrg, USE 2 (TWO) TIMES DAILY AS DIRECTED, Disp: , Rfl:   ???  blood sugar diagnostic Strp, Frequency:PHARMDIR   Dosage:0.0     Instructions:  Note:Use to test blood sugar in the morning before breakfast and in the evening two hours after dinner, ISD-9 250.00. Dose: 1, Disp: , Rfl:   ???  blood-glucose meter Misc, U QID UTD, Disp: , Rfl:   ??? brimonidine (MIRVASO) 0.33 % Gel, Apply every 12 hours to reduce redness, Disp: 30 g, Rfl: 11  ???  cetirizine (ZYRTEC) 10 MG tablet, Take 10 mg by mouth daily., Disp: , Rfl:   ???  cholecalciferol, vitamin D3, 1,000 unit (25 mcg) tablet, Take 1,000 Units by mouth., Disp: , Rfl:   ???  clindamycin (CLEOCIN) 300 MG capsule, TAKE 1 CAPSULE BY MOUTH TWO TIMES A DAY., Disp: 60 capsule, Rfl: 6  ???  clobetasoL (TEMOVATE) 0.05 % ointment, Apply topically Two (2) times a day., Disp: 60 g, Rfl: 5  ???  empagliflozin 25 mg Tab, Take 25 mg by mouth., Disp: , Rfl:   ???  empty container Misc, Use as directed to dispose of Cosentyx pens., Disp: 1 each, Rfl: 2  ???  finasteride (PROSCAR) 5 mg tablet, TAKE 1 TABLET BY MOUTH EVERY DAY, Disp: 90 tablet, Rfl: 3  ???  fluconazole (DIFLUCAN) 150 MG tablet, TAKE 1 TAB NOW AN THEN TAKE 1 TABLET ONCE WEEKLY AS NEEDED FOR YEAST INFECTION, Disp: 4 tablet, Rfl: 5  ???  furosemide (LASIX) 40 MG tablet, Take 40 mg by mouth daily. , Disp: , Rfl:   ???  gabapentin (NEURONTIN) 300 MG capsule, Take 1 capsule (300 mg total) by mouth Two (2) times a day., Disp: 60 capsule, Rfl: 0  ???  HUMULIN R U-500, CONC, INSULIN 500 unit/mL CONCENTRATED injection, INJECT 58 UNITS SUBCUTANEOUSLY ONCE DAILY TO BE USED IN INSULIN PUMP., Disp: , Rfl: 5  ???  HYDROcodone-acetaminophen (NORCO) 5-325 mg per tablet, Take 1-2 tablets every 6 hours as needed for pain, Disp: 15 tablet, Rfl: 0  ???  hydrocortisone-iodoquinL-aloe2 (ALCORTIN A) 2-1-1 % Gel, Apply topically.  (Patient not taking: Reported on 01/13/2021), Disp: , Rfl:   ???  INSULIN PUMP CONTROLLER MISC, by Miscellaneous route., Disp: , Rfl:   ???  KLOR-CON M20 20 mEq tablet, TAKE 1 TABLET (20 MEQ TOTAL) BY MOUTH ONCE DAILY, Disp: , Rfl: 1  ???  levothyroxine (SYNTHROID, LEVOTHROID) 50 MCG tablet, TAKE 1 TABLET DAILY  ON AN EMPTY STOMACH WITH A GLASS OF WATER 30-60 MINUTES BEFORE BREAKFAST., Disp: , Rfl: 0  ???  lidocaine (XYLOCAINE) 5 % ointment, Apply to painful areas every 3-4 hours as needed, Disp: 60 g, Rfl: 11  ???  lisinopril (PRINIVIL,ZESTRIL) 10 MG tablet, Take 10 mg by mouth daily., Disp: , Rfl:   ???  meloxicam (MOBIC) 15 MG tablet, Take 15 mg by mouth daily., Disp: , Rfl: 1  ???  metFORMIN (GLUCOPHAGE) 500 MG tablet, Take 1,000 mg by mouth Two (2) times a day., Disp: , Rfl:   ???  multivitamin capsule, Take 1 capsule by mouth daily., Disp: , Rfl:   ???  NIKKI, 28, 3-0.02 mg per tablet, TAKE 1 TABLET BY MOUTH EVERY DAY, Disp: 84 tablet, Rfl: 2  ???  pantoprazole (PROTONIX) 40 MG tablet, Take 40 mg by mouth., Disp: , Rfl:   ???  pen needle, diabetic 31 gauge x 5/16 Ndle, Use 1 needle to skin four times daily as directed, Disp: , Rfl:   ???  secukinumab (COSENTYX) 150 mg/mL PnIj injection, Inject the contents of 2 pens (300 mg total) under the skin once a week for the first 5 weeks as loading doses, Disp: 10 mL, Rfl: 0  ???  secukinumab (COSENTYX) 150 mg/mL PnIj injection, Inject the contents of 2 pens (300 mg total) under the skin every fourteen (14) days as maintenance doses.  START 14 days after last loading dose. (Patient not taking: Reported on 01/13/2021), Disp: 4 mL, Rfl: 3  ???  SUBCUTANEOUS INSULIN PUMP MISC, by Miscellaneous route., Disp: , Rfl:   ???  TRULICITY 0.75 mg/0.5 mL injection pen, INJECT 0.5 MLS (0.75 MG TOTAL) SUBCUTANEOUSLY ONCE A WEEK, Disp: , Rfl:     Allergies  Venom-honey bee, Lamictal [lamotrigine], and Oxycodone    Family History   Adopted: Yes   Problem Relation Age of Onset   ??? Diabetes Mother    ??? Hypertension Mother    ??? Diabetes Father    ??? Hypertension Father    ??? Melanoma Neg Hx    ??? Basal cell carcinoma Neg Hx    ??? Squamous cell carcinoma Neg Hx        Social History  Social History     Tobacco Use   ??? Smoking status: Former     Packs/day: 2.00     Years: 20.00     Pack years: 40.00     Types: Cigarettes     Quit date: 2013     Years since quitting: 9.9   ??? Smokeless tobacco: Never   Substance Use Topics   ??? Alcohol use: No   ??? Drug use: No        Physical Exam     VITAL SIGNS:      Vitals:    07/24/21 1656 07/24/21 1944 07/24/21 2300 07/24/21 2310   BP: 197/133 150/84 138/100 152/86   Pulse: 113 103 101 98   Resp: 18 19 18     Temp: 36.9 ??C (98.5 ??F) 36.9 ??C (98.5 ??F)     TempSrc: Oral Oral     SpO2: 98% 96% 97% 97%       Constitutional: Alert and oriented.  Chronically ill-appearing but no acute distress  Eyes: Conjunctivae are normal.  HEENT: Normocephalic and atraumatic. Conjunctivae clear. No congestion. Moist mucous membranes.   Cardiovascular: Rate as above, regular rhythm. Normal and symmetric distal pulses. Brisk capillary refill. Normal skin turgor.  Respiratory: Normal respiratory effort. Breath sounds are normal.  There are no wheezing or crackles heard.  Gastrointestinal: Soft, non-distended, erythema in the abdominal skin fold as described in more detail above  Genitourinary: Deferred.  Musculoskeletal: Bilateral lower extremity edema, regular the left with associated mild tenderness to palpation  Neurologic: Normal speech and language. No gross focal neurologic deficits are appreciated. Patient is moving all extremities equally, face is symmetric at rest and with speech.  Skin: Erythema in the abdominal skin folds and well-healing areas of hidradenitis suppurativa as described in more detail above, otherwise unremarkable, normal for race  Psychiatric: Mood and affect are normal. Speech and behavior are normal.     Radiology     XR Chest Portable   Final Result      No acute findings.      ED POCUS Chest Bilateral   Final Result      PVL Venous Duplex Lower Extremity Bilateral         CTA Chest W Contrast    (Results Pending)        Laboratory Data     Lab Results   Component Value Date    WBC 4.9 07/24/2021    HGB 12.5 07/24/2021    HCT 36.7 07/24/2021    PLT 275 07/24/2021       Lab Results   Component Value Date    NA 134 (L) 07/24/2021    K 4.6 07/24/2021    CL 100 07/24/2021    CO2 18.0 (L) 07/24/2021    BUN 16 07/24/2021    CREATININE 0.99 (H) 07/24/2021    GLU 426 (H) 07/24/2021    CALCIUM 8.9 07/24/2021    MG 2.3 (H) 05/27/2012    PHOS 4.4 05/27/2012       Lab Results   Component Value Date    BILITOT 0.4 07/24/2021    PROT 6.0 07/24/2021    ALBUMIN 2.3 (L) 07/24/2021    ALT 21 07/24/2021    AST 24 07/24/2021    ALKPHOS 55 07/24/2021    GGT 123 (H) 05/26/2012       Lab Results   Component Value Date    INR 1.2 05/26/2012    APTT 32.7 05/26/2012       Pertinent labs & imaging results that were available during my care of the patient were reviewed by me and considered in my medical decision making (see chart for details).    Portions of this record have been created using Scientist, clinical (histocompatibility and immunogenetics). Dictation errors have been sought, but may not have been identified and corrected.       Raechel Ache, MD  Resident  07/24/21 [phone]

## 2021-07-25 NOTE — Unmapped (Signed)
[facility]  Emergency Department Medical Screening Examination     Subjective     Summer Russell is a 45 y.o. female presenting for evaluation of Weakness. Per EMS report, the patient arrives from [facility] with global weakness, having become nauseated during her infusion (Renflexis for RA). She was unable to stand given her symptoms, although she is typically able to ambulate 6 ft with assistance. On EMS evaluation, the patient remained orthostatic though hypertensive to 297/133, HR 113 (highest 145 upon standing), SpO2 98% on RA. She was notably hyperglycemic to 462, elevated from her baseline glucose in the 200-300s (she checks her glucose semi-regularly at home).      Abbreviated Review of Systems/Covid Screen  Constitutional: Negative for fever  Respiratory: Negative for cough. Negative for difficulty breathing.    Objective     ED Triage Vitals [07/24/21 1656]   Enc Vitals Group      BP 197/133      Heart Rate 113      SpO2 Pulse       Resp 18      Temp 36.9 ??C (98.5 ??F)      Temp Source Oral      SpO2 98 %     Focused Physical Exam  Constitutional: No acute distress.  Respiratory: Non-labored respirations.  Neurological: Clear speech. No gross focal neurologic deficits are appreciated.  ?  Assessment & Plan     Appropriate ED triage orders placed. Will defer further work-up and assessment to the main ED team.      A medical screening exam has been performed. At the time of this evaluation, no emergency medical condition requiring immediate stabilization has been identified nor is there suspicion for imminent decompensation. Appropriate triage protocols will be implemented and a comprehensive ED evaluation with disposition will be completed by a healthcare provider when an appropriate ED location becomes available. The patient is aware that this is an initial encounter only and verbalizes understanding and agreement with the plan.     Emergency Department operations continue to be impacted by the COVID-19 pandemic.     Sundra Aland  July 24, 2021 4:55 PM    Documentation assistance was provided by Lorrine Kin, Scribe, on July 24, 2021 at 5:00 PM for Sundra Aland, MD.    A scribe was used when documenting this visit. I agree with the above documentation. Signed by  Para March on  July 24, 2021 at 5:07 PM

## 2021-07-25 NOTE — Unmapped (Addendum)
Summer Russell is a 45 y.o. female with PMHx of uncontrolled type 2 diabetes, hypothyroidism, at bedtime, psoriasis complicated by arthritis, fibromyalgia, and sleep apnea admitted from [facility] ED for severe hyperglycemia and lactic acidosis.     Principal Problem:    Lactic acidosis  Active Problems:    Hypertension    Hyperglycemia due to type 2 diabetes mellitus (CMS-HCC)    Proteinuria    CKD (chronic kidney disease)    Hyponatremia    Axillary hidradenitis suppurativa    Psoriasis    Hypothyroidism    Hyperlipidemia  Resolved Problems:    * No resolved hospital problems. *     Lactic Acidosis - Weakness  On admission to the ED patient with lactate of 4.8 that has since up trended to 5.8 despite 1 L fluid repletion.  Patient without obvious signs of infection.  No leukocytosis or fever, chest x-ray clear and UA without pyuria.  It is possible that patient is not mounting fever or leukocytosis in setting of immunosuppressive therapies for arthritis and hidradenitis though this does not account for clear infectious source. Could consider HS and panniculitis as infectious sources. Patient with low albumin in setting of significant urinary losses; given this, patient could be third spacing with low intravascular fluids leading to hypoperfusion.  Additionally, metformin could be producing a lactic acidosis that is worsened in the setting of progressive renal disease.  We will treat empirically for sepsis at this time and resuscitate with an additional 1 L of fluids while holding metformin.  Received x1 dose of CTX as well as azithromycin in ED  - S/p 2L bolus fluids, now lactate 2.4  - RPP negative  - ethanol < 10: negative  -Utox: negative   - Empiric vanc/cefe; continue   - F/u BCx2  - Stop metformin  - F/u thiamine     Headache - Vertigo  She reports spinning sensation worsened with movement, with some associated nausea. Previously had head CT at [facility] for similar symptoms, and husband reports she was on outpatient meclizine at some point.   - Trial of one-time compazine dose, then prn meclizine     Headache - Vertigo  Reports that Zorfan and meclizine have not helped much thus far. Thinks compazine may have been helpful. She does report ~2 years of headaches, also vertigo that previously did not respond to meclizine, often worsened with lying flat; question whether she may have element of IIH (vs tension-type headaches, with frontal/band-like quality).   - Brain MRI   - May benefit from vestibular PT  - Prn IV compazine (first line), Zofran (second line)  - Prn Tylenol     Hyperglycemia - Uncontrolled T2DM  On admission patient blood glucose elevated to 426 with greater than 1000 BG in urine despite stopping Jardiance several months ago.  Per patient report, she is nonadherent to many of her diabetes medication and is unsure how much she takes though she does clarify that she has consistently taken metformin for many years.  We will have to estimate insulin requirement while inpatient and reflectively uptitrate as needed.  - 15 NPH BID + SSI; uptitrate PRN (consider meal time)  - Repeat A1C: 10.9  - consider consult endocrine for help outpatient?  - Stop metformin  - UA glucose >1000 mg/dL     Proteinuria - Chronic Kidney Disease  Cr 0.99 from 0.89 in 06/2021. In setting of severe proteinuria, I am concerned patient may be developing significant diabetic nephropathy. Wonder if  this may be in turn contributing to poor clearance of metformin. Will fluid resuscitate as above and closely trend Cr. Pt likely to need inpt vs opt w/u of developing nephropathy.  - S/p 1L LR; ordered additional 1 L   - Daily CMP  - Consider nephro consult in AM (do we need this?)  - UPCR: 0.14 (normal)  - urine creat: 95, K: 47.4, Na: 30, urea 449  - FeNa: pre-renal  - urine protein > 600 mg/dL     Hyponatremia (improved) - Unclear etiology. Have ordered for urine lytes and serum osm though likely to be skewed by fluid resuscitation. Likely consequence of lasix  - serum 134  - urine na: 30 (normal < 20)     Cardiomegaly - As seen on CT scan and CXR; will obtain TTE for better functional characterization, particularly in s/o extensive family history of cardiac dz.  - TTE     Hydradenitis - Managed by [facility] rheum, on iv renflexis, finasteride, clinda  - S/p Renflexis infusion 12/19  - Continue clindamycin (can hold while on vanc?), finasteride     Psoriasis- Patient unsure of cosentyx frequency and dosing; verify with pharmacy  Hypothyroidism - Continue home 50 every day + 25 T/Th  HTN - Holding home lisinopril 20 mg, lasix; resume as able  HLD - Continue home lipitor  Contraception - Continue home Yaz  Sleep Apnea - CPAP qhs     Daily Checklist:  Diet: Carbohydrate Restricted  DVT PPx: Lovenox 40mg  q24h  Electrolytes: No Repletion Needed    Summer Russell is a 45 y.o. 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     Patient presented to [facility] infusion center 12/19 for Renflexis infusion.  Following infusion she was wheeled to the bathroom and realized that she was unable to stand due to global weakness.  At baseline patient able to ambulate 6 feet with assistance.  On evaluation by EMS, patient continued to feel weak, lightheaded, and short of breath. Vitals reported as BP 297/133, HR 113 (peak 145 on standing) with normal O2 on RA. BG taken at the time notable for hyperglycemia to 462 (baseline 200s to 300s). Sent to ED for further evaluation.     In the ED patient hypertensive to 152/86 with other vitals within normal limits.  Appearing chronically ill but in no acute distress. Labs notable for sodium 130, bicarb 18, creatinine 0.99, glucose 426, and lactate 4.9.  Received 1 L of fluids in the ED with repeat lactate increased to 5.8.  CTA findings: No emboli and no acute findings. Coronary calcifications.   No pulmonary or pulm process appreciated on imaging.  PVLs obtained in the ED negative for clot.     On my exam, patient appearing chronically ill but in no acute distress.  A&O x4 with stable vital signs. During discussion with pt, she noted that she does not regularly check her blood sugar or regularly take her insulin. She endorses usually taking long-acting insulin though she is unsure the amount. Frequently misses mealtime insulin.  Endorses compliance with long-term metformin use.  She denies chest pain associated with the event but does endorse a family h/o heart disease (father passed away 74s, one brother passed away in 92's and another in 97's, all from cardiac dz). She denies shortness of breath at present and denies any  recent  history of fevers, chills, sick contact, diarrhea.  Endorses some nausea while in the ED without vomiting.  Endorses coughing up phlegm today and recalls having some sort of viral respiratory illness approximately 3 weeks ago.  Also notes increase in swelling in her legs and abdomen.  She is diagnosed with lymphedema and typically experiences swelling predominantly in the right leg and abdomen; in recent days, however, she has noticed swelling in both legs as well as worsening abdominal swelling.    Code Status: Full Code

## 2021-07-25 NOTE — Unmapped (Signed)
Vancomycin Therapeutic Monitoring Pharmacy Note    Summer Russell is a 45 y.o. female starting vancomycin. Date of therapy initiation: 07/25/21    Indication:   suspected infection, unknown source    Prior Dosing Information: Recieved 1x dose vancomycin 2750 mg ~0500 on 12/20     Goals:  Therapeutic Drug Levels  Vancomycin trough goal: 10-15 mg/L    Additional Clinical Monitoring/Outcomes  Renal function, volume status (intake and output)    Results: Not applicable    Wt Readings from Last 1 Encounters:   07/24/21 (!) 170.5 kg (375 lb 12.8 oz)     Creatinine   Date Value Ref Range Status   07/25/2021 1.49 (H) 0.60 - 0.80 mg/dL Final   16/05/9603 5.40 (H) 0.60 - 0.80 mg/dL Final   98/06/9146 8.29 (H) 0.60 - 0.80 mg/dL Final        Pharmacokinetic Considerations and Significant Drug Interactions:  ??? Adult (estimated initial): Vd = 121 L, ke = 0.042 hr-1  ??? Concurrent nephrotoxic meds: not applicable   ??? Weight = 170.5 kg    Assessment/Plan:  Recommendation(s)  ??? Patient had a large scr jump today, will hold further doses and get random with AM labs on 12/21  ??? Estimated trough on recommended regimen: Not applicable - dosing by level    Follow-up  ??? Level due: ordered with AM labs on 12/21  ??? A pharmacist will continue to monitor and order levels as appropriate    Please page service pharmacist with questions/clarifications.    [place] M Solangel Mcmanaway, PharmD

## 2021-07-25 NOTE — Unmapped (Signed)
bib ems from [facility]. C/o weakness, hx of dm. bg 432. no fevers or sick contacts. hr 110. Hypertensive. rr slightly tachypneic at 22 and unlabored on arrival

## 2021-07-25 NOTE — Unmapped (Signed)
Received sign out from previous provider.    ?? Patient Summary: Summer Russell is a 45 y.o. female with history of significant rheumatologic diseases, diabetes, thyroid disease, psoriatic arthritis, panniculitis, hydradenitis on clindamycin at home [name] for shortness of breath and generalized weakness after receiving Renflexis infusion. PVL lower extremity duplex negative.  Lactate is elevated.  Concern for PE versus pneumonia.  She has received 1 L LR, and empiric coverage with ceftriaxone and azithromycin.  ?? Action List:   ?? F/u repeat glucose, lactate, and CTA chest.     Updates  ED Course as of 07/25/21 0307   Mon Jul 24, 2021   2324 Repeat POC glucose is 273 which is improved from prior after fluids. Repeat lactate increased to 5.8.    2333 UA negative for infection, shows large amount of glucose and protein.   Tue Jul 25, 2021   0204 CTA negative for acute finding.   0208 MAO paged.   1610 Patient admitted to [facility].

## 2021-07-25 NOTE — Unmapped (Signed)
Received sign out from previous provider.    ?? Patient Summary: Summer Russell is a 45 y.o. female with history of significant rheumatologic diseases, diabetes, thyroid disease, psoriatic arthritis, panniculitis, hydradenitis on clindamycin at home [name] for shortness of breath and generalized weakness after receiving Renflexis infusion. PVL lower extremity duplex negative.  Lactate is elevated.   ?? Admitted to medicine, being treated empirically for sepsis of unknown source   ?? She has received 2 L LR, and empiric coverage with ceftriaxone and azithromycin.    ?? Action List:   ?? Admission     Updates  ED Course as of 07/25/21 1312   Tue Jul 25, 2021   0753 Repeat BG 329. Asked RN to notify medicine team since they have placed admission orders.

## 2021-07-26 LAB — BLOOD GAS CRITICAL CARE PANEL, VENOUS
BASE EXCESS VENOUS: -2 (ref -2.0–2.0)
CALCIUM IONIZED VENOUS (MG/DL): 4.83 mg/dL (ref 4.40–5.40)
GLUCOSE WHOLE BLOOD: 224 mg/dL — ABNORMAL HIGH (ref 70–179)
HCO3 VENOUS: 24 mmol/L (ref 22–27)
HEMOGLOBIN BLOOD GAS: 11.7 g/dL — ABNORMAL LOW (ref 12.00–16.00)
LACTATE BLOOD VENOUS: 1.9 mmol/L — ABNORMAL HIGH (ref 0.5–1.8)
O2 SATURATION VENOUS: 64.7 % (ref 40.0–85.0)
PCO2 VENOUS: 44 mmHg (ref 40–60)
PH VENOUS: 7.34 (ref 7.32–7.43)
PO2 VENOUS: 34 mmHg (ref 30–55)
POTASSIUM WHOLE BLOOD: 4 mmol/L (ref 3.4–4.6)
SODIUM WHOLE BLOOD: 139 mmol/L (ref 135–145)

## 2021-07-26 LAB — COMPREHENSIVE METABOLIC PANEL
ALBUMIN: 2.1 g/dL — ABNORMAL LOW (ref 3.4–5.0)
ALKALINE PHOSPHATASE: 53 U/L (ref 46–116)
ALT (SGPT): 19 U/L (ref 10–49)
ANION GAP: 13 mmol/L (ref 5–14)
AST (SGOT): 21 U/L (ref <=34)
BILIRUBIN TOTAL: 0.3 mg/dL (ref 0.3–1.2)
BLOOD UREA NITROGEN: 19 mg/dL (ref 9–23)
BUN / CREAT RATIO: 14
CALCIUM: 8.6 mg/dL — ABNORMAL LOW (ref 8.7–10.4)
CHLORIDE: 103 mmol/L (ref 98–107)
CO2: 20 mmol/L (ref 20.0–31.0)
CREATININE: 1.36 mg/dL — ABNORMAL HIGH
EGFR CKD-EPI (2021) FEMALE: 49 mL/min/{1.73_m2} — ABNORMAL LOW (ref >=60)
GLUCOSE RANDOM: 229 mg/dL — ABNORMAL HIGH (ref 70–179)
POTASSIUM: 4 mmol/L (ref 3.4–4.8)
PROTEIN TOTAL: 5.3 g/dL — ABNORMAL LOW (ref 5.7–8.2)
SODIUM: 136 mmol/L (ref 135–145)

## 2021-07-26 LAB — PHOSPHORUS: PHOSPHORUS: 4 mg/dL (ref 2.4–5.1)

## 2021-07-26 LAB — CBC
HEMATOCRIT: 32.7 % — ABNORMAL LOW (ref 34.0–44.0)
HEMOGLOBIN: 11.1 g/dL — ABNORMAL LOW (ref 11.3–14.9)
MEAN CORPUSCULAR HEMOGLOBIN CONC: 34.1 g/dL (ref 32.0–36.0)
MEAN CORPUSCULAR HEMOGLOBIN: 30.4 pg (ref 25.9–32.4)
MEAN CORPUSCULAR VOLUME: 89.3 fL (ref 77.6–95.7)
MEAN PLATELET VOLUME: 8.5 fL (ref 6.8–10.7)
PLATELET COUNT: 215 10*9/L (ref 150–450)
RED BLOOD CELL COUNT: 3.66 10*12/L — ABNORMAL LOW (ref 3.95–5.13)
RED CELL DISTRIBUTION WIDTH: 13.5 % (ref 12.2–15.2)
WBC ADJUSTED: 3.7 10*9/L (ref 3.6–11.2)

## 2021-07-26 LAB — SYPHILIS SCREEN: SYPHILIS RPR SCREEN: NONREACTIVE

## 2021-07-26 LAB — HEPATITIS B SURFACE ANTIBODY
HEPATITIS B SURFACE ANTIBODY QUANT: <8 m[IU]/mL (ref <8.00)
HEPATITIS B SURFACE ANTIBODY: NONREACTIVE

## 2021-07-26 LAB — HEPATITIS B CORE ANTIBODY, TOTAL: HEPATITIS B CORE TOTAL ANTIBODY: NONREACTIVE

## 2021-07-26 LAB — HEPATITIS B SURFACE ANTIGEN: HEPATITIS B SURFACE ANTIGEN: NONREACTIVE

## 2021-07-26 LAB — HEPATITIS C ANTIBODY: HEPATITIS C ANTIBODY: NONREACTIVE

## 2021-07-26 LAB — MAGNESIUM: MAGNESIUM: 1.7 mg/dL (ref 1.6–2.6)

## 2021-07-26 LAB — HIV ANTIGEN/ANTIBODY COMBO: HIV ANTIGEN/ANTIBODY COMBO: NONREACTIVE

## 2021-07-26 MED ADMIN — cefepime (MAXIPIME) 2 g in sodium chloride 0.9 % (NS) 100 mL IVPB-connector bag: 2 g | INTRAVENOUS | @ 08:00:00 | Stop: 2021-08-01

## 2021-07-26 MED ADMIN — pantoprazole (PROTONIX) EC tablet 40 mg: 40 mg | ORAL | @ 15:00:00

## 2021-07-26 MED ADMIN — insulin NPH (HumuLIN,NovoLIN) injection 20 Units: 20 [IU] | SUBCUTANEOUS | @ 15:00:00

## 2021-07-26 MED ADMIN — clindamycin (CLEOCIN) capsule 300 mg: 300 mg | ORAL | @ 10:00:00 | Stop: 2021-08-08

## 2021-07-26 MED ADMIN — aspirin chewable tablet 81 mg: 81 mg | ORAL | @ 15:00:00

## 2021-07-26 MED ADMIN — cholecalciferol (vitamin D3 25 mcg (1,000 units)) tablet 25 mcg: 25 ug | ORAL | @ 15:00:00

## 2021-07-26 MED ADMIN — insulin NPH (HumuLIN,NovoLIN) injection 20 Units: 20 [IU] | SUBCUTANEOUS | @ 23:00:00

## 2021-07-26 MED ADMIN — cetirizine (ZyrTEC) tablet 10 mg: 10 mg | ORAL | @ 15:00:00

## 2021-07-26 MED ADMIN — levothyroxine (SYNTHROID) tablet 50 mcg: 50 ug | ORAL | @ 10:00:00

## 2021-07-26 MED ADMIN — insulin lispro (HumaLOG) injection 0-20 Units: 0-20 [IU] | SUBCUTANEOUS | @ 15:00:00

## 2021-07-26 MED ADMIN — enoxaparin (LOVENOX) syringe 40 mg: 40 mg | SUBCUTANEOUS | @ 15:00:00

## 2021-07-26 MED ADMIN — nystatin (MYCOSTATIN) powder 1 application: 1 | TOPICAL | @ 18:00:00

## 2021-07-26 MED ADMIN — diclofenac sodium (VOLTAREN) 1 % gel 2 g: 2 g | TOPICAL | @ 18:00:00

## 2021-07-26 MED ADMIN — finasteride (PROSCAR) tablet 5 mg: 5 mg | ORAL | @ 15:00:00

## 2021-07-26 MED ADMIN — insulin lispro (HumaLOG) injection 0-20 Units: 0-20 [IU] | SUBCUTANEOUS | @ 22:00:00

## 2021-07-26 MED ADMIN — bumetanide (BUMEX) tablet 1 mg: 1 mg | ORAL | @ 23:00:00

## 2021-07-26 MED ADMIN — diclofenac sodium (VOLTAREN) 1 % gel 2 g: 2 g | TOPICAL | @ 10:00:00

## 2021-07-26 MED ADMIN — atorvastatin (LIPITOR) tablet 20 mg: 20 mg | ORAL | @ 15:00:00

## 2021-07-26 MED ADMIN — cefepime (MAXIPIME) 2 g in sodium chloride 0.9 % (NS) 100 mL IVPB-connector bag: 2 g | INTRAVENOUS | @ 18:00:00 | Stop: 2021-08-01

## 2021-07-26 MED ADMIN — diclofenac sodium (VOLTAREN) 1 % gel 2 g: 2 g | TOPICAL | @ 23:00:00

## 2021-07-26 NOTE — Unmapped (Signed)
Nephrology Consult     Requesting provider: Dr. Mayford Knife  Service requesting consult: [facility]  Reason for consult: Nephrotic Range Proteinuria       Assessment/Recommendations: Summer Russell is a 45 y.o. female with a past medical history of uncontrolled T2DM, hypothryoidism, psoriasis admitted after feeling weak from her long-standing infliximab infusion. She was found to have an elevated lactate and nephrotic range proteinuria.     # Nephrotic Range Proteinuria: Etiology is broad at this time. High on the differential would include diabetic nephropathy along with secondary FSGS from obesity or minimal change disease from NSAID use. Also on the differential includes membranous nephropathy and other secondary causes such as amyloid. UPC 14.4 and UAC 7,643 on 12/20. Her poorly controlled diabetes and her UAC of 243 in 2014 in care everywhere (no further urine studies done) make at least some element of diabetic nephropathy very likely. She did see a nephrology about a decade ago for protein in her urine she says, but she did not follow up again. Other nephrotic symptoms include frothy urine, edema, hypoalbuminemia (2.5), hyperlipidemia. Urine sediment reviewed with fatty casts and granular debris. CT Ab/P on 12/20 without hydro with both kidneys.   - Given significant proteinuria, would recommend a kidney biopsy in the future. This does not have to be done while inpatient especially given that she has ongoing infectious work up, but if cleared from the infectious standpoint given her social barriers she may benefit from inpatient biopsy by VIR.   - We discussed the importance of a low salt diet to avoid further swelling  - Discussed the importance of avoiding all NSAIDs in the future  - Agree with continuation of ACE inhibitor for renal protection, can be uptitrated as outpatient. She may benefit from addition of SGLT-2 inhibitor as an outpatient as well. She had been on this previously but was stopped due to starting lasix.  - Hepatitis serologies and HIV negative. Would check ANA and dsDNA, agree with SPEP  - She will need close outpatient nephrology follow up. I have messaged out clinic to scheduled an appointment in 3 weeks. She said she would be able to follow up with [facility].  - Would recommend diuresis, start Bumex 1mg  PO BID    Patient was seen and discussed with attending nephrologist Dr. Christy Sartorius. Please feel free to reach out with any questions, concerns, or changes in patient condition. Thank you for the consult.     Marian Sorrow, MD  [facility] Nephrology Fellow, PGY-4  Department of Nephrology and Hypertension            _____________________________________________________________________________________      History of Present Illness: Summer Russell is a 45 y.o. female with a past medical history of uncontrolled T2DM, hypothryoidism, psoriasis admitted after feeling weak from her long-standing infliximab infusion. She was found to have an elevated lactate and nephrotic range proteinuria. She has tolerated her infusion many times before. She became lightheaded and was feeling very ill so she was admitted to the hospital. Prior to coming to the hospital she had noticed months of worsening bilateral lower extremity swelling (she had always had right lower extremity swelling, but now she also noticed left leg swelling along with abdominal swelling), along with frothy urine. She felt like she urine had soap in it. She also had occasional light headedness. She had been referred to cardiology for her swelling but the work up was negative. She had not recently been told she had any kidney  problems, but she does remember that about a decade ago she saw a nephrologist after a urine test that was performed by her endocrinologist at the time returned with protein in it. She does not remember what they discussed but she never followed up. Now, she is more concerned with her swelling as it has been bothering her recently.       Medications:   Current Facility-Administered Medications   Medication Dose Route Frequency Provider Last Rate Last Admin   ??? acetaminophen (TYLENOL) tablet 1,000 mg  1,000 mg Oral Q8H PRN Benjaman Kindler, MD   1,000 mg at 07/25/21 1807   ??? aspirin chewable tablet 81 mg  81 mg Oral Daily Benjaman Kindler, MD   81 mg at 07/26/21 1006   ??? atorvastatin (LIPITOR) tablet 20 mg  20 mg Oral Daily Benjaman Kindler, MD   20 mg at 07/26/21 1006   ??? cefepime (MAXIPIME) 2 g in sodium chloride 0.9 % (NS) 100 mL IVPB-connector bag  2 g Intravenous Q8H Benjaman Kindler, MD   Stopped at 07/26/21 1433   ??? cetirizine (ZyrTEC) tablet 10 mg  10 mg Oral Daily Benjaman Kindler, MD   10 mg at 07/26/21 1006   ??? cholecalciferol (vitamin D3 25 mcg (1,000 units)) tablet 25 mcg  25 mcg Oral Daily Benjaman Kindler, MD   25 mcg at 07/26/21 1006   ??? clindamycin (CLEOCIN) capsule 300 mg  300 mg Oral BID Benjaman Kindler, MD   300 mg at 07/26/21 1610   ??? dextrose 50 % in water (D50W) 50 % solution 12.5 g  12.5 g Intravenous Q10 Min PRN Benjaman Kindler, MD       ??? diclofenac sodium (VOLTAREN) 1 % gel 2 g  2 g Topical QID Benjaman Kindler, MD   2 g at 07/26/21 1314   ??? senna (SENOKOT) tablet 1 tablet  1 tablet Oral BID PRN Benjaman Kindler, MD        And   ??? docusate sodium (COLACE) capsule 100 mg  100 mg Oral BID PRN Benjaman Kindler, MD       ??? enoxaparin (LOVENOX) syringe 40 mg  40 mg Subcutaneous Q12H Benjaman Kindler, MD   40 mg at 07/26/21 1005   ??? finasteride (PROSCAR) tablet 5 mg  5 mg Oral Daily Benjaman Kindler, MD   5 mg at 07/26/21 1006   ??? glucagon injection 1 mg  1 mg Intramuscular Once PRN Benjaman Kindler, MD       ??? glucose chewable tablet 16 g  16 g Oral Q10 Min PRN Benjaman Kindler, MD       ??? insulin lispro (HumaLOG) injection 0-20 Units  0-20 Units Subcutaneous ACHS Benjaman Kindler, MD   6 Units at 07/26/21 1005   ??? insulin NPH (HumuLIN,NovoLIN) injection 20 Units  20 Units Subcutaneous BID Starleen Blue, MD   20 Units at 07/26/21 1005   ??? levothyroxine (SYNTHROID) tablet 25 mcg  25 mcg Oral Once per day on Tue Thu Benjaman Kindler, MD   25 mcg at 07/25/21 9604   ??? levothyroxine (SYNTHROID) tablet 50 mcg  50 mcg Oral Daily Benjaman Kindler, MD   50 mcg at 07/26/21 5409   ??? nystatin (MYCOSTATIN) powder 1 application  1 application Topical BID Starleen Blue, MD   1 application at 07/26/21 1315   ??? ondansetron (ZOFRAN) injection 4 mg  4 mg Intravenous  Q6H PRN Starleen Blue, MD   4 mg at 07/25/21 1806   ??? pantoprazole (PROTONIX) EC tablet 40 mg  40 mg Oral Daily Benjaman Kindler, MD   40 mg at 07/26/21 1006   ??? polyethylene glycol (MIRALAX) packet 17 g  17 g Oral Daily PRN Benjaman Kindler, MD         Current Outpatient Medications   Medication Sig Dispense Refill   ??? ascorbic Acid (VITAMIN C) 500 mg CpER Take 500 mg by mouth.     ??? aspirin (ECOTRIN) 81 MG tablet Take 81 mg by mouth daily.     ??? atorvastatin (LIPITOR) 20 MG tablet Take 20 mg by mouth daily.     ??? cetirizine (ZYRTEC) 10 MG tablet Take 10 mg by mouth daily.     ??? cholecalciferol, vitamin D3, 1,000 unit (25 mcg) tablet Take 1,000 Units by mouth.     ??? clindamycin (CLEOCIN) 300 MG capsule TAKE 1 CAPSULE BY MOUTH TWO TIMES A DAY. 60 capsule 6   ??? finasteride (PROSCAR) 5 mg tablet TAKE 1 TABLET BY MOUTH EVERY DAY 90 tablet 3   ??? fluconazole (DIFLUCAN) 150 MG tablet TAKE 1 TAB NOW AN THEN TAKE 1 TABLET ONCE WEEKLY AS NEEDED FOR YEAST INFECTION 4 tablet 5   ??? furosemide (LASIX) 40 MG tablet Take 60 mg by mouth daily.     ??? gabapentin (NEURONTIN) 300 MG capsule Take 1 capsule (300 mg total) by mouth Two (2) times a day. 60 capsule 0   ??? levothyroxine (SYNTHROID, LEVOTHROID) 50 MCG tablet Take by mouth. Taking on Monday Wednesday Friday, Saturday & Sunday and on Tuesday & Thursday  0   ??? lisinopril (PRINIVIL,ZESTRIL) 10 MG tablet Take 20 mg by mouth daily.     ??? meloxicam (MOBIC) 15 MG tablet Take 15 mg by mouth daily.  1   ??? metFORMIN (GLUCOPHAGE) 500 MG tablet Take 1,000 mg by mouth Two (2) times a day.     ??? multivitamin capsule Take 1 capsule by mouth daily.     ??? NIKKI, 28, 3-0.02 mg per tablet TAKE 1 TABLET BY MOUTH EVERY DAY 84 tablet 2   ??? pantoprazole (PROTONIX) 40 MG tablet Take 40 mg by mouth.     ??? secukinumab (COSENTYX) 150 mg/mL PnIj injection Inject the contents of 2 pens (300 mg total) under the skin every fourteen (14) days as maintenance doses.  START 14 days after last loading dose. 4 mL 3   ??? BD INSULIN SYRINGE ULTRA-FINE 1 mL 31 gauge x 5/16 (8 mm) Syrg USE 2 (TWO) TIMES DAILY AS DIRECTED     ??? blood sugar diagnostic Strp Frequency:PHARMDIR   Dosage:0.0     Instructions:  Note:Use to test blood sugar in the morning before breakfast and in the evening two hours after dinner, ISD-9 250.00. Dose: 1     ??? blood-glucose meter Misc U QID UTD     ??? empty container Misc Use as directed to dispose of Cosentyx pens. 1 each 2   ??? lidocaine (XYLOCAINE) 5 % ointment Apply to painful areas every 3-4 hours as needed 60 g 11   ??? pen needle, diabetic 31 gauge x 5/16 Ndle Use 1 needle to skin four times daily as directed          ALLERGIES  Venom-honey bee, Lamictal [lamotrigine], and Oxycodone    MEDICAL HISTORY  Past Medical History:   Diagnosis Date   ??? CKD (chronic kidney  disease) 07/25/2021   ??? Diabetes mellitus (CMS-HCC)    ??? Disease of thyroid gland    ??? Fibromyalgia    ??? Hydradenitis    ??? Psoriasis    ??? Psoriatic arthritis (CMS-HCC)    ??? Sleep apnea         SOCIAL HISTORY  Social History     Socioeconomic History   ??? Marital status: Divorced   Tobacco Use   ??? Smoking status: Former     Packs/day: 2.00     Years: 20.00     Pack years: 40.00     Types: Cigarettes     Quit date: 2013     Years since quitting: 9.9   ??? Smokeless tobacco: Never   Substance and Sexual Activity   ??? Alcohol use: No   ??? Drug use: No   Other Topics Concern   ??? Do you use sunscreen? Yes   ??? Tanning bed use? No   ??? Are you easily burned? No   ??? Excessive sun exposure? No   ??? Blistering sunburns? Yes FAMILY HISTORY  Family History   Adopted: Yes   Problem Relation Age of Onset   ??? Diabetes Mother    ??? Hypertension Mother    ??? Diabetes Father    ??? Hypertension Father    ??? Melanoma Neg Hx    ??? Basal cell carcinoma Neg Hx    ??? Squamous cell carcinoma Neg Hx         No family history of kidney disease    Review of Systems:  A 12 system review of systems was negative except as noted in HPI.  Otherwise as per HPI, all other systems reviewed and negative    Physical Exam:  Vitals:    07/26/21 1321   BP: 137/73   Pulse: 108   Resp: 20   Temp: 37.1 ??C (98.7 ??F)   SpO2: 95%     I/O this shift:  In: 300 [IV Piggyback:300]  Out: -     Intake/Output Summary (Last 24 hours) at 07/26/2021 1526  Last data filed at 07/26/2021 1433  Gross per 24 hour   Intake 300 ml   Output --   Net 300 ml     General: ill-appearing, no acute distress  HEENT: anicteric sclera, oropharynx clear without lesions  CV: regular rate, normal rhythm, no murmurs, no gallops, no rubs, 2+ tender peripheral edema  Lungs: difficult to assess but no clear wheezing and normal work of breathing   Abd: soft, non-tender, abdominal wall edema   Skin: no visible lesions or rashes  Psych: alert, engaged, appropriate mood and affect  Musculoskeletal: no obvious deformities  Neuro: normal speech, no gross focal deficits     Test Results  Reviewed  Lab Results   Component Value Date    NA 136 07/26/2021    NA 139 07/26/2021    K 4.0 07/26/2021    K 4.0 07/26/2021    CL 103 07/26/2021    CO2 20.0 07/26/2021    BUN 19 07/26/2021    CREATININE 1.36 (H) 07/26/2021    GFR >= 60 06/02/2012    GLU 229 (H) 07/26/2021    CALCIUM 8.6 (L) 07/26/2021    ALBUMIN 2.1 (L) 07/26/2021    PHOS 4.0 07/26/2021         I have reviewed all relevant outside healthcare records related to the patient's kidney injury.

## 2021-07-26 NOTE — Unmapped (Signed)
OCCUPATIONAL THERAPY  Evaluation (seen with Scarlette Shorts, PT) (07/26/21 0824)    Patient Name:  Summer Russell       Medical Record Number: [id number]   Date of Birth: 12-Feb-1976  Sex: Female          OT Treatment Diagnosis:  Decreased functional mobility, strength, and impaired sensation of LLE impacting ADL performance    Problem List: Impaired sensation, Increased edema, Impaired ADLs, Fall risk, Decreased mobility, Decreased strength     Clinical Decision Making: Moderate     Assessment: Summer Russell is a 45 y.o. female with history of significant rheumatologic diseases, diabetes, thyroid disease, psoriatic arthritis, panniculitis, hydradenitis on clindamycin at home [name] for shortness of breath and generalized weakness after receiving Renflexis infusion. PVL lower extremity duplex negative.  Lactate is elevated.     Pt is presenting to OT with decreased functional mobility, strength, and impaired LLE sensation impacting ADL performance. Pt was able to complete sit<>stand and small side steps this date with min A x2 + RW. She is highly motivated to participate in therapy and desires to return to walking as she did before her L achilles injury. She was willing to work with therapy this date despite pain and frequent bowel urgency. This indicates pt is appropriate for post-acute OT 5x/wk (high intensity).  Recommend acute OT 3-4x/wk to maximize safety and functional independence.    Today's Interventions: Other  Today's Interventions: Pt educated on role of OT, POC. Pt completed sup<>sit, EOB sitting tolerance, sit<>stand, standing tolerance, side steps towards HOB    Activity Tolerance During Today's Session  Tolerated treatment well    Plan  Planned Frequency of Treatment:  1-2x per day for: 3-4x week  Planned Treatment Duration: 08/16/21    Planned Interventions:  Adaptive equipment, Functional mobility, Education - Patient, ADL retraining, Education - Family / caregiver, Home exercise program, Balance activities, Endurance activities, Bed mobility, Compensatory tech. training, Conservation, Positioning, Safety education, Range of motion, Therapeutic exercise, UE Strength / coordination exercise, Transfer training, Wheelchair training    Post-Discharge Occupational Therapy Recommendations:   5x weekly, High intensity   OT DME Recommendations: Defer to post acute -      GOALS:   Patient and Family Goals: none stated    IP Long Term Goal #1: Pt will score 24/24 on the AMPAC in 6 weeks     Short Term:  Pt will complete BSC t/f with CGA + LRAD   Time Frame : 2 weeks  Pt will complete full body dressing with min A + AE PRN   Time Frame : 2 weeks  Pt will complete >2 min standing ADL with CGA + LRAD   Time Frame : 2 weeks     Prognosis:  Good  Positive Indicators:  PLOF, motivated  Barriers to Discharge: Functional strength deficits, Gait instability, Impaired Balance, Pain    Subjective  Current Status Pt rec'd and left sitting upright on stretcher. Call bell in reach, all immediate needs met, RN aware and husband at bedside  Prior Functional Status Pt reports that ~1 year ago she was independent with ADLs and active in the community. Around Oct of 2021 she injured her achilles and was placed on bedrest for 6 weeks, which became 6 months. She began PT after 6 months and recently had to stop 2/2 insurance, but does not feel that she has returned to her baseline functioning. She spends almost all of her time in  her bedroom, leaving the house only for doctor's appointments. She endorses many close falls, but only one complete fall in the past year. She uses a w/c for functional mobility outside of the home but is able to furniture walk within her house. Pt's w/c does not fit on their ramp, therefore she walks up/down the ramp to enter/exit the house and uses the railings for stability. She enjoys spending time with her dogs and cats at home    Medical Tests / Procedures: reviewed in EPIC Patient / Caregiver reports: I'm sorry I wasn't able to do more. I'm really afraid of injuring this leg    Past Medical History:   Diagnosis Date   ??? CKD (chronic kidney disease) 07/25/2021   ??? Diabetes mellitus (CMS-HCC)    ??? Disease of thyroid gland    ??? Fibromyalgia    ??? Hydradenitis    ??? Psoriasis    ??? Psoriatic arthritis (CMS-HCC)    ??? Sleep apnea     Social History     Tobacco Use   ??? Smoking status: Former     Packs/day: 2.00     Years: 20.00     Pack years: 40.00     Types: Cigarettes     Quit date: 2013     Years since quitting: 9.9   ??? Smokeless tobacco: Never   Substance Use Topics   ??? Alcohol use: No      Past Surgical History:   Procedure Laterality Date   ??? IR INSERT PORT AGE GREATER THAN 5 YRS  11/06/2019    IR INSERT PORT AGE GREATER THAN 5 YRS 11/06/2019 Summer Gerlach, MD IMG [facility]   ??? SKIN BIOPSY     ??? TONSILLECTOMY      Family History   Adopted: Yes   Problem Relation Age of Onset   ??? Diabetes Mother    ??? Hypertension Mother    ??? Diabetes Father    ??? Hypertension Father    ??? Melanoma Neg Hx    ??? Basal cell carcinoma Neg Hx    ??? Squamous cell carcinoma Neg Hx         Venom-honey bee, Lamictal [lamotrigine], and Oxycodone     Objective Findings  Precautions / Restrictions  Falls precautions    Weight Bearing  Non-applicable    Required Braces or Orthoses  Non-applicable    Communication Preference  Verbal    Pain  pt endorsed 7/10 pain overall, RN aware and activity adjusted per tolerance    Equipment / Environment  Patient not wearing mask for full session, Vascular access (PIV, TLC, Port-a-cath, PICC)    Living Situation  Living Environment: House  Lives With: Spouse  Home Living: One level home, Ramped entrance, Standard height toilet, Walk-in shower (ramps are too narrow for WC, grab bar in door frame near toilet, shower is too small to fully fit a shower chair)  Equipment available at home: Rollator, Wheelchair-manual     Cognition   Orientation Level:  Oriented x 4   Arousal/Alertness: Appropriate responses to stimuli   Attention Span:  Difficulty dividing attention   Memory:  Appears intact (some difficulty with specific timing of events, able to provide general time frames)   Following Commands:  Follows all commands and directions without difficulty   Safety Judgment:  Good awareness of safety precautions   Awareness of Errors:  Good awareness of errors made   Problem Solving:  Able to problem solve independently   Comments:  Vision / Hearing   Vision: No acute deficits identified  Vision Comments: pt has glasses but reports that she doesnt wear them. She endorsed fluctuating vision 2/2 diabetes  Hearing: No deficit identified       Hand Function:  Right Hand Function: Right hand grip strength, ROM and coordination WNL  Left Hand Function: Left hand grip strength, ROM and coordination WNL    Skin Inspection:  Skin Inspection: Swelling (BLE; pt reports that abdomen/pannus is more edematous than normal)    ROM / Strength:  UE ROM/Strength: Left WFL, Right WFL  LE ROM/Strength: Left Impaired/Limited, Right Impaired/Limited  RLE Impairment: Reduced strength  LLE Impairment: Reduced strength  LE ROM/ Strength Comment: Pt endorsed pain in LLE, however able to pivot LLE onto bed to doff sock. RLE with more strength than LLE but unable to pivot RLE on bed for LB dressing    Coordination:  Coordination: WFL    Sensation:  RUE Sensation: RUE impaired  RUE Sensation Impairment: chronic peripheral neuropathy  LUE Sensation: LUE impaired  LUE Sensation Impairment: chronic peripheral neuropathy (L hand more impacted than R)  RLE Sensation: RLE impaired  RLE Sensation Impairment: chronic peripheral neuropathy  LLE Sensation: LLE impaired  LLE Sensation Impairment: chronic peripheral neuropathy    Balance:  sitting=SBA, standing=min A x2 + RW    Functional Mobility  Transfer Assistance Needed: Yes (sit<>stand from EOB with)  Bed Mobility Assistance Needed: Yes (sup>sit with CGA, sit>sup with mod A for LE management)  Ambulation: side steps towards HOB with min A x2 + RW    ADLs  ADLs: Needs assistance with ADLs  ADLs - Needs Assistance: Feeding, Grooming, Bathing, Toileting, UB dressing, LB dressing  Feeding - Needs Assistance: Set Up Assist, Performed at bed level  Grooming - Needs Assistance: Performed at Bed level, Set Up Assist  Bathing - Needs Assistance: Min assist, Performed seated  Toileting - Needs Assistance: Mod assist (to [facility])  UB Dressing - Needs Assistance: Min assist, Performed at bed level  LB Dressing - Needs Assistance:  (pt doffed L sock with ind, required total A for R sock. Total A to don bilat socks 2/2 abdominal pain and increased edema)    Vitals / Orthostatics  At Rest: NAD  With Activity: SpO2=97% on ra, HR=121, BP=153/100 (114)  Vitals/Orthostatics: after OOB activity, pt reported feeling like she could not catch her breath but denied dizziness. See vitals above    Medical Staff Made Aware: RN Cicero Duck    Occupational Therapy Session Duration  OT Individual [mins]: 10  OT Co-Treatment [mins]: 18  Reason for Co-treatment: To safely progress mobility       I attest that I have reviewed the above information.  Signed: Lauralyn Primes, OT  Filed 07/26/2021

## 2021-07-26 NOTE — Unmapped (Signed)
PHYSICAL THERAPY  Evaluation (07/26/21 0825)          Patient Name:?? Summer Russell????????   Medical Record Number: [id number]   Date of Birth: 25-Apr-1976  Sex: Female??  ??    Treatment Diagnosis: Decreased mobility, decreased strength     Activity Tolerance: Tolerated treatment well     ASSESSMENT  Problem List: Decreased strength, Fall risk, Gait deviation, Impaired ADLs, Pain, Increased edema, Decreased endurance, Decreased mobility, Impaired balance      Assessment : Summer Russell is a 45 y.o. female with PMHx of uncontrolled type 2 diabetes, hypothyroidism, at bedtime, psoriasis complicated by arthritis, fibromyalgia, and sleep apnea admitted from [facility] ED for severe hyperglycemia and lactic acidosis. Patient with pertinent PMH above who presents to PT with associated deficits in mobility, strength, and balance. Patient able to complete bed mobiltiy with SBA but requires increased assist for OOB mobiltiy given recent decline in functional independence. Patient was a community ambulator roughly one year ago but has had a gradual decline in endurance and strength follow an achilles injury. Patient displays good motivation for therapy and mobility but remains limited by L LE weakness and pain. Recommending post-acute PT frequency of 5xH as patient has good family support, was previously independent, and demonstrates good understanding of need for additional therapy and motivation to participate.     After a review of the personal factors, comorbidities, clinical presentation, and examination of the number of affected body systems, the patient presents as a moderate complexity case.     Today's Interventions: PT evaluation, supine <> sit, STS, education re: PT role, POC, safe mobility, safe DME use, need for additional therapy, importance of continued mobility                PLAN  Planned Frequency of Treatment:?? 1-2x per day for: 4-5x week Planned Treatment Duration: 08/09/21     Planned Interventions: Endurance activities, Education - Patient, Balance activities, Education - Family / caregiver, Diaphragmatic / Pursed-lip breathing, Therapeutic exercise, Therapeutic activity, Functional mobility, Gait training, Home exercise program, Self-care / Home training, Transfer training, Stair training, Wheelchair training     Post-Discharge Physical Therapy Recommendations:?? 5x weekly, High intensity     PT DME Recommendations: Defer to post acute??????????       Goals:   Patient and Family Goals: I want to actually walk again.     Long Term Goal #1: In 8 weeks, patient will score 20/20 on AMPAC        SHORT GOAL #1: Patient will complete bed mobility indep  ?????????????????????? Time Frame : 2 weeks  SHORT GOAL #2: Patient will complete functional transfers mod I with LRAD  ?????????????????????? Time Frame : 2 weeks  SHORT GOAL #3: Patient will ambulate 71' with min A and LRAD  ?????????????????????? Time Frame : 2 weeks     ??????????????????????       Prognosis:?? Good  Positive Indicators: Age, family support, motivation  Barriers to Discharge: Functional strength deficits, Gait instability, Impaired Balance, Pain     SUBJECTIVE  Patient reports: I want to walk again.  Current Functional Status: Patient received and left sitting upright in stretcher, husband present, call Summer Virgo within reach, all immediate needs met     Prior Functional Status: Patient was ambulatory roughly 1 year ago but tore her achilles and was placed on bedrest for 6 months before starting OP PT. Patient currently unable to take more than 5 steps before needing to sit down, uses  WC when she leaves her home but primarily stays in her bedroom. 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  Equipment available at home: Rollator, [facility]      Past Medical History:   Diagnosis Date   ??? CKD (chronic kidney disease) 07/25/2021   ??? Diabetes mellitus (CMS-HCC)    ??? Disease of thyroid gland    ??? Fibromyalgia    ??? Hydradenitis    ??? Psoriasis    ??? Psoriatic arthritis (CMS-HCC)    ??? Sleep apnea             Social History     Tobacco Use   ??? Smoking status: Former     Packs/day: 2.00     Years: 20.00     Pack years: 40.00     Types: Cigarettes     Quit date: 2013     Years since quitting: 9.9   ??? Smokeless tobacco: Never   Substance Use Topics   ??? Alcohol use: No       Past Surgical History:   Procedure Laterality Date   ??? IR INSERT PORT AGE GREATER THAN 5 YRS  11/06/2019    IR INSERT PORT AGE GREATER THAN 5 YRS 11/06/2019 Soledad Gerlach, MD IMG [facility]   ??? SKIN BIOPSY     ??? TONSILLECTOMY               Family History   Adopted: Yes   Problem Relation Age of Onset   ??? Diabetes Mother    ??? Hypertension Mother    ??? Diabetes Father    ??? Hypertension Father    ??? Melanoma Neg Hx    ??? Basal cell carcinoma Neg Hx    ??? Squamous cell carcinoma Neg Hx         Allergies: Venom-honey bee, Lamictal [lamotrigine], and Oxycodone                  Objective Findings  Precautions / Restrictions  Precautions: Falls precautions  Weight Bearing Status: Non-applicable  Required Braces or Orthoses: Non-applicable     Communication Preference: Verbal          Pain Comments: Endorses 7/10 pain in LE joints and low back  Medical Tests / Procedures: Labs, orderes, vitals reviewed in Counselling psychologist / Environment: Patient not wearing mask for full session, Vascular access (PIV, TLC, Port-a-cath, PICC)     At Rest: BP 153/100 (114) HR 121  With Activity: NAD  Orthostatics: Asymptomatic        Living Situation  Living Environment: House  Lives With: Spouse  Home Living: One level home, Ramped entrance, Standard height toilet, Walk-in shower (ramps are too narrow for WC, grab bar in door frame near toilet, shower is too small to fully fit a shower chair)      Cognition: WFL  Cognition comment: patient able to answer all questions appropriately, motivated but scared about her health getting worse        Skin Inspection: Swelling  Skin Inspection comment: significant abdominal edema, increased redness underneath abdomen     Upper Extremities  UE ROM: Right WFL, Left WFL  UE Strength: Left WFL, Right WFL    Lower Extremities  LE ROM: Right Impaired/Limited, Left Impaired/Limited  RLE ROM Impairment: Limited AROM  LLE ROM Impairment: Limited AROM  LE Strength: Right Impaired/Limited, Left  Impaired/Limited  RLE Strength Impairment: Reduced strength  LLE Strength Impairment: Reduced strength  LE comment: LEs grossly weak overall with L weaker than R     Sensation comment: Peripheral neuropathy in both feet, patient reports new L LE weakness/decreased proprioception  Balance comment: static sitting EOB supervision, static standing min A x 2 with RW  Posture comment: FHRSP      Bed Mobility: supine to sit SBA, sit to supine min A for LEs     Transfer comments: STS min A x 2 with RW      Gait: Patient able to march in place with RW and min A, decreased foot clearance L > R 2/2 weakness           Physical Therapy Session Duration  PT Individual [mins]: 10  PT Co-Treatment [mins]: 18  Reason for Co-treatment: To safely progress mobility (Seen with Simonne Come, OT)     Medical Staff Made Aware: RN, MD     I attest that I have reviewed the above information.  Signed: Rise Paganini, PT  Filed 07/26/2021

## 2021-07-26 NOTE — Unmapped (Signed)
Internal Medicine (MEDW) Progress Note    Assessment & Plan:   Summer Russell is a 45 y.o. 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     Principal Problem:    Lactic acidosis  Active Problems:    Hypertension    Hyperglycemia due to type 2 diabetes mellitus (CMS-HCC)    Proteinuria    CKD (chronic kidney disease)    Hyponatremia    Axillary hidradenitis suppurativa    Psoriasis    Hypothyroidism    Hyperlipidemia  Resolved Problems:    * No resolved hospital problems. *    Elevated lactate - Weakness  Lactate peaked at 5.8, now downtrended. No clear infectious source, with blood cultures NGTD and with skin exam consistent with intertrigo but no clear signs of cellulitis.   - Empiric cefepime until blood cx clear for 48 hours (stopped vanc w/ neg MRSA swab)  - F/u 12/19 blood cultures  - Holding metformin  - Welcome Dermatology recs on whether this presentation may be linked to outpatient infliximab infusion (since her symptoms appeared to be chronologically linked)  - PT/OT consulted   ??  Hyperglycemia - Uncontrolled T2DM  BG elevated to >400 on admission (no AG or acidemia to suggest DKA), with patient unclear of diabetic regimen at home. Continuing to titrate insulin regimen.  - Increase NPH to 20 U bid  - Continue correctional lispro  - Holding metformin  - Anticipate consult to DM educator   ??  Nephrotic range proteinuria - AKI  Most concerned for diabetic nephropathy, but also sent additional labs (infectious studies, complements) for further work-up, with C4 slightly low at 9.1. She has known lymphedema, but she does report worsened edema compared to baseline.   - Nephrology consulted, welcome recs  - Currently holding home Lasix     Intertrigo  Exam consistent with intertrigo under pannus; no clear cellulitic component.   - BID nystatin powder    Urinary retention  Foley placed 12/21 with >1 L of UOP. Initial UA without evidence of infection (and she is already on antibiotics as above).    Hydradenitis suppurativa - Psoriasis   Managed by dermatology outpatient; on IV Renflexis, finasteride, and clindamycin. She may have been taking Cosentyx, but she is unsure of dose and frequency.  - S/p Renflexis infusion 12/19  - Continue clindamycin, finasteride  - Requested dermatology eval   ????  Cardiomegaly on CT  TTE technically difficult, but LVEF estimated at >55% with mildly increased wall thickness. Low suspicion for cardiac etiology of edema.    Left leg sensory deficits  She has decreased sensation over left foot and lower leg. Concern for component of diabetic neuropathy   - PT/OT consulted as above, welcome recs  - DM control as above, may benefit from medications targeting neuropathy     Hypothyroidism - Continue home Synthroid 50 mcg every day + 25 mcg T/Th  HTN - Holding home lisinopril 10 mg, Lasix w/ AKI; resume as able  HLD - Continue home Lipitor  Contraception - Verify whether on home Yaz  Sleep Apnea - CPAP at bedtime  GERD - Continue home PPI  ??  Daily Checklist:  Diet: Carbohydrate Restricted  DVT PPx: Lovenox  Electrolytes: Replete prn  Code Status: Full Code    Team Contact Information:   Primary Team: Internal Medicine (MEDW)  Primary Resident:  Starleen Blue, MD  Resident's Pager: [phone] (Gen MedW Intern - Cliffton Asters)    Interval History:   [name] reports several loose stools ON (she has IBS and is wondering whether this is linked to antibiotic broadening). No significant new abdominal pain otherwise. She also is having a hard time urinating, noting that she did not really urinate overnight. She has required Foleys in the past, per her report.    All other systems were reviewed and are negative except as noted in the HPI    Objective:   Heart Rate:  [88-112] 88  SpO2 Pulse:  [112] 112  Resp:  [17-18] 17  BP: (142-153)/(93-100) 153/100  SpO2:  [95 %-98 %] 95 %    Gen: Sitting up in bed, appears uncomfortable but in NAD, answers questions appropriately.  Eyes: Sclera anicteric, EOMI.  HENT: Atraumatic, normocephalic.  Heart: RRR, S1, S2, no M/R/G.  Lungs: CTAB, no crackles or wheezes, no use of accessory muscles  Abdomen: No focal tenderness, soft.  Extremities: 2+ edema in legs bilaterally.  Neuro: Unable to assess gait. Decreased sensation to light touch diffusely over distal foot, able to bend knee and do plantar flexion/extension.  Skin:  Intertrigo under pannus.  Psych: Alert, oriented, normal mood and affect.     Labs/Studies: Labs and Studies from the last 24hrs per EMR and Reviewed

## 2021-07-26 NOTE — Unmapped (Signed)
Bed: 03-P  Expected date:   Expected time:   Means of arrival:   Comments:  held

## 2021-07-26 NOTE — Unmapped (Signed)
Received page regarding concerns for possible infliximab-related reaction that may have prompted pt's presentation of lactic acidosis and weakness. Pt presented with lightheadedness, dyspnea, and weakness from transfusion clinic and was found to have lactic acidosis and hypertension. Infectious workup has been unremarkable thus far. She is well-known to Dermatology and follows with Dr. Janyth Contes outpatient for hidradenitis suppurativa, and has been managed on Remicade and Cosentyx, in addition to oral medications. She has previously tolerated infusions well based on visit notes with Dermatology.     Infusion-related reactions to infliximab occur in ~20% of patients and can be a/w nausea, HA, flushing, dyspnea, taste perversion. Rare reactions include hypotension, chest pain, anaphylaxis and convulsions.     There has been one case report of lactic acidosis occurring following infliximab infusion (PMID: [id number]). Thus, it is possible pt's lactic acidosis could be attributed to infliximab. More commonly, patients on infliximab have higher risk of infection due to immunosuppressive nature of the biologic. Renal complications are rarely reported with infliximab, however there is a case report of new nephrotic syndrome a/w infliximab (PMID:??[id number]). Theoretically, infliximab, being an antibody to TNF-?, would not be expected to induce significant proteinuria from glomerular injury.??     Would recommend continued workup for other etiologies of pt's metabolic abnormalities. If negative, Dermatology could consider/discuss changing systemic therapy management for pt's HS outpatient.     Dossie Arbour MD  PGY-3 Dermatology

## 2021-07-27 LAB — COMPREHENSIVE METABOLIC PANEL
ALBUMIN: 1.9 g/dL — ABNORMAL LOW (ref 3.4–5.0)
ALKALINE PHOSPHATASE: 49 U/L (ref 46–116)
ALT (SGPT): 23 U/L (ref 10–49)
ANION GAP: 13 mmol/L (ref 5–14)
AST (SGOT): 49 U/L — ABNORMAL HIGH (ref <=34)
BILIRUBIN TOTAL: 0.3 mg/dL (ref 0.3–1.2)
BLOOD UREA NITROGEN: 15 mg/dL (ref 9–23)
BUN / CREAT RATIO: 14
CALCIUM: 7.8 mg/dL — ABNORMAL LOW (ref 8.7–10.4)
CHLORIDE: 107 mmol/L (ref 98–107)
CO2: 20 mmol/L (ref 20.0–31.0)
CREATININE: 1.06 mg/dL — ABNORMAL HIGH
EGFR CKD-EPI (2021) FEMALE: 67 mL/min/{1.73_m2} (ref >=60)
GLUCOSE RANDOM: 244 mg/dL — ABNORMAL HIGH (ref 70–179)
POTASSIUM: 3.6 mmol/L (ref 3.4–4.8)
PROTEIN TOTAL: 5.1 g/dL — ABNORMAL LOW (ref 5.7–8.2)
SODIUM: 140 mmol/L (ref 135–145)

## 2021-07-27 LAB — CBC
HEMATOCRIT: 30.9 % — ABNORMAL LOW (ref 34.0–44.0)
HEMOGLOBIN: 10.4 g/dL — ABNORMAL LOW (ref 11.3–14.9)
MEAN CORPUSCULAR HEMOGLOBIN CONC: 33.8 g/dL (ref 32.0–36.0)
MEAN CORPUSCULAR HEMOGLOBIN: 30.1 pg (ref 25.9–32.4)
MEAN CORPUSCULAR VOLUME: 89.1 fL (ref 77.6–95.7)
MEAN PLATELET VOLUME: 8.7 fL (ref 6.8–10.7)
PLATELET COUNT: 201 10*9/L (ref 150–450)
RED BLOOD CELL COUNT: 3.47 10*12/L — ABNORMAL LOW (ref 3.95–5.13)
RED CELL DISTRIBUTION WIDTH: 13.7 % (ref 12.2–15.2)
WBC ADJUSTED: 3 10*9/L — ABNORMAL LOW (ref 3.6–11.2)

## 2021-07-27 LAB — SERUM PROTEIN ELECTROPHORESIS AND IMMUNOFIXATION
ALBUMIN (SPE): 2.8 g/dL — ABNORMAL LOW (ref 3.5–5.0)
ALPHA-1 GLOBULIN: 0.3 g/dL (ref 0.2–0.5)
ALPHA-2 GLOBULIN: 1.1 g/dL (ref 0.5–1.1)
BETA-1 GLOBULIN: 0.3 g/dL (ref 0.3–0.6)
BETA-2 GLOBULIN: 0.3 g/dL (ref 0.2–0.6)
GAMMAGLOBULIN: 0.5 g/dL (ref 0.5–1.5)
PROTEIN TOTAL: 5.2 g/dL

## 2021-07-27 LAB — MAGNESIUM: MAGNESIUM: 1.5 mg/dL — ABNORMAL LOW (ref 1.6–2.6)

## 2021-07-27 LAB — ANTI-DNA ANTIBODY, DOUBLE-STRANDED: DSDNA ANTIBODY: NEGATIVE

## 2021-07-27 LAB — PHOSPHORUS: PHOSPHORUS: 3.8 mg/dL (ref 2.4–5.1)

## 2021-07-27 MED ADMIN — bumetanide (BUMEX) tablet 1 mg: 1 mg | ORAL | @ 18:00:00

## 2021-07-27 MED ADMIN — pantoprazole (PROTONIX) EC tablet 40 mg: 40 mg | ORAL | @ 15:00:00

## 2021-07-27 MED ADMIN — acetaminophen (TYLENOL) tablet 1,000 mg: 1000 mg | ORAL | @ 22:00:00

## 2021-07-27 MED ADMIN — levothyroxine (SYNTHROID) tablet 50 mcg: 50 ug | ORAL | @ 11:00:00

## 2021-07-27 MED ADMIN — diclofenac sodium (VOLTAREN) 1 % gel 2 g: 2 g | TOPICAL | @ 11:00:00

## 2021-07-27 MED ADMIN — diclofenac sodium (VOLTAREN) 1 % gel 2 g: 2 g | TOPICAL | @ 03:00:00

## 2021-07-27 MED ADMIN — magnesium sulfate 2gm/50mL IVPB: 2 g | INTRAVENOUS | @ 18:00:00 | Stop: 2021-07-27

## 2021-07-27 MED ADMIN — clindamycin (CLEOCIN) capsule 300 mg: 300 mg | ORAL | @ 03:00:00 | Stop: 2021-08-08

## 2021-07-27 MED ADMIN — bumetanide (BUMEX) tablet 1 mg: 1 mg | ORAL | @ 11:00:00

## 2021-07-27 MED ADMIN — prochlorperazine (COMPAZINE) injection 5 mg: 5 mg | INTRAVENOUS | @ 15:00:00 | Stop: 2021-07-27

## 2021-07-27 MED ADMIN — atorvastatin (LIPITOR) tablet 20 mg: 20 mg | ORAL | @ 14:00:00

## 2021-07-27 MED ADMIN — insulin lispro (HumaLOG) injection 0-20 Units: 0-20 [IU] | SUBCUTANEOUS | @ 18:00:00

## 2021-07-27 MED ADMIN — levothyroxine (SYNTHROID) tablet 25 mcg: 25 ug | ORAL | @ 11:00:00

## 2021-07-27 MED ADMIN — acetaminophen (TYLENOL) tablet 1,000 mg: 1000 mg | ORAL | @ 10:00:00

## 2021-07-27 MED ADMIN — finasteride (PROSCAR) tablet 5 mg: 5 mg | ORAL | @ 15:00:00

## 2021-07-27 MED ADMIN — cefepime (MAXIPIME) 2 g in sodium chloride 0.9 % (NS) 100 mL IVPB-connector bag: 2 g | INTRAVENOUS | @ 06:00:00 | Stop: 2021-07-27

## 2021-07-27 MED ADMIN — cetirizine (ZyrTEC) tablet 10 mg: 10 mg | ORAL | @ 15:00:00

## 2021-07-27 MED ADMIN — meclizine (ANTIVERT) tablet 25 mg: 25 mg | ORAL | @ 21:00:00

## 2021-07-27 MED ADMIN — diclofenac sodium (VOLTAREN) 1 % gel 2 g: 2 g | TOPICAL | @ 22:00:00

## 2021-07-27 MED ADMIN — labetalol (NORMODYNE) injection: 10 mg | INTRAVENOUS | @ 06:00:00 | Stop: 2021-07-27

## 2021-07-27 MED ADMIN — acetaminophen (TYLENOL) tablet 1,000 mg: 1000 mg | ORAL | @ 01:00:00

## 2021-07-27 MED ADMIN — insulin lispro (HumaLOG) injection 0-20 Units: 0-20 [IU] | SUBCUTANEOUS | @ 15:00:00

## 2021-07-27 MED ADMIN — cholecalciferol (vitamin D3 25 mcg (1,000 units)) tablet 25 mcg: 25 ug | ORAL | @ 14:00:00

## 2021-07-27 MED ADMIN — diclofenac sodium (VOLTAREN) 1 % gel 2 g: 2 g | TOPICAL | @ 18:00:00

## 2021-07-27 MED ADMIN — insulin lispro (HumaLOG) injection 0-20 Units: 0-20 [IU] | SUBCUTANEOUS | @ 03:00:00

## 2021-07-27 MED ADMIN — aspirin chewable tablet 81 mg: 81 mg | ORAL | @ 14:00:00

## 2021-07-27 MED ADMIN — enoxaparin (LOVENOX) syringe 40 mg: 40 mg | SUBCUTANEOUS | @ 03:00:00

## 2021-07-27 MED ADMIN — nystatin (MYCOSTATIN) powder 1 application: 1 | TOPICAL | @ 03:00:00

## 2021-07-27 MED ADMIN — empagliflozin (JARDIANCE) tablet 10 mg: 10 mg | ORAL | @ 18:00:00

## 2021-07-27 MED ADMIN — lisinopriL (PRINIVIL,ZESTRIL) tablet 20 mg: 20 mg | ORAL | @ 15:00:00

## 2021-07-27 MED ADMIN — enoxaparin (LOVENOX) syringe 40 mg: 40 mg | SUBCUTANEOUS | @ 14:00:00

## 2021-07-27 MED ADMIN — ondansetron (ZOFRAN) injection 4 mg: 4 mg | INTRAVENOUS | @ 14:00:00

## 2021-07-27 MED ADMIN — nystatin (MYCOSTATIN) powder 1 application: 1 | TOPICAL | @ 18:00:00

## 2021-07-27 MED ADMIN — clindamycin (CLEOCIN) capsule 300 mg: 300 mg | ORAL | @ 15:00:00 | Stop: 2021-08-08

## 2021-07-27 NOTE — Unmapped (Addendum)
Care Management  Initial Transition Planning Assessment    Per H&P: Summer Russell is a 45 y.o. 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                General  Care Manager assessed the patient by : Telephone conversation with patient, Medical record review, Discussion with Clinical Care team  Orientation Level: Oriented X4  Functional level prior to admission: Independent  Reason for referral: Discharge Planning, Placement, Transportation   Type of Residence: Mailing Address:  [address]  [place] [address]   Physical address: [address] [place], [place] [address]   (2 sets of ramp but not wife enough for the wheelchair)  Contacts: Extended Emergency Contact Information  Primary Emergency Contact: Pegg,Derek   [place]  Home Phone: [phone]  Relation: Other  Patient Phone Number: [phone] (home)         Medical Provider(s): [facility]  Reason for Admission: Admitting Diagnosis:  Panniculitis [M79.3]  Weakness [R53.1]  Past Medical History:   has a past medical history of CKD (chronic kidney disease) (07/25/2021), Diabetes mellitus (CMS-HCC), Disease of thyroid gland, Fibromyalgia, Hydradenitis, Psoriasis, Psoriatic arthritis (CMS-HCC), and Sleep apnea.  Past Surgical History:   has a past surgical history that includes Tonsillectomy; IR Insert Port Age Greater Than 5 Years (11/06/2019); and Skin biopsy.   Previous admit date: N/A    Primary Insurance- Payor: [place] [employer] [place] / Plan: [place] [employer] [place] / Product Type: *No Product type* /   Secondary Insurance - None  Prescription Coverage - same as above  Preferred Pharmacy - [id number] - [place], [place] - [address]  [facility]    Transportation home: Medical Transportation    Contact/Decision Maker  Extended Emergency Contact Information  Primary Emergency Contact: Pegg,Derek   [place]  Home Phone: [phone]  Relation: Other    Legal Next of Kin / Guardian / POA / Advance Directives     Advance Directive (Medical Treatment)  Does patient have an advance directive covering medical treatment?: Patient does not have advance directive covering medical treatment.  Reason patient does not have an advance directive covering medical treatment:: Patient needs follow-up to complete one.    Health Care Decision Maker [HCDM] (Medical & Mental Health Treatment)  Healthcare Decision Maker: Patient needs follow-up to appoint a Health Care Decision Maker.  Information offered on HCDM, Medical & Mental Health advance directives:: Patient given information.    Readmission Information    Have you been hospitalized in the last 30 days?: No    Patient Information  Lives with: Spouse/significant other    Type of Residence: Private residence     Location/Detail: [place], [place]    Support Systems/Concerns: Significant Other, Family Members    Responsibilities/Dependents at home?: No    Home Care services in place prior to admission?: No     Equipment Currently Used at Home: cane, straight, walker, rolling, wheelchair, manual    Currently receiving outpatient dialysis?: No    Financial Information    Need for financial assistance?: No (Pt receives SSDI)    Social Determinants of Health  Social Determinants of Health     Food Insecurity: No Food Insecurity   ??? Worried About Programme researcher, broadcasting/film/video  in the Last Year: Never true   ??? Ran Out of Food in the Last Year: Never true   Tobacco Use: Medium Risk   ??? Smoking Tobacco Use: Former   ??? Smokeless Tobacco Use: Never   ??? Passive Exposure: Not on file   Transportation Needs: No Transportation Needs   ??? Lack of Transportation (Medical): No   ??? Lack of Transportation (Non-Medical): No   Alcohol Use: Not on file   Housing/Utilities: Low Risk    ??? Within the past 12 months, have you ever stayed: outside, in a car, in a tent, in an overnight shelter, or temporarily in someone else's home (i.e. couch-surfing)?: No   ??? Are you worried about losing your housing?: No   ??? Within the past 12 months, have you been unable to get utilities (heat, electricity) when it was really needed?: No   Substance Use: Not on file   Financial Resource Strain: Low Risk    ??? Difficulty of Paying Living Expenses: Not hard at all   Physical Activity: Not on file   Health Literacy: Not on file   Stress: Not on file   Intimate Partner Violence: Not on file   Depression: Not on file   Social Connections: Not on file       Complex Discharge Information    Is patient identified as a difficult/complex discharge?: No    Discharge Needs Assessment  Concerns to be Addressed: care coordination/care conferences, adjustment to diagnosis/illness    Clinical Risk Factors: New Diagnosis    Barriers to taking medications: No    Prior overnight hospital stay or ED visit in last 90 days: No    Anticipated Changes Related to Illness: none    Equipment Needed After Discharge: other (see comments) (Per recs)    Discharge Facility/Level of Care Needs: rehabilitation facility    Readmission  Risk of Unplanned Readmission Score: UNPLANNED READMISSION SCORE: 15.17%  Predictive Model Details          15% (Medium)  Factor Value    Calculated 07/27/2021 08:02 27% Number of active Rx orders 48    [place] Risk of Unplanned Readmission Model 16% Active NSAID Rx order present     8% ECG/EKG order present in last 6 months     7% Latest calcium low (8.6 mg/dL)     6% Diagnosis of electrolyte disorder present     5% Imaging order present in last 6 months     5% Latest hemoglobin low (11.1 g/dL)     5% Phosphorous result present     4% Number of ED visits in last six months 1     4% Active anticoagulant Rx order present     3% Latest creatinine high (1.36 mg/dL)     3% Diagnosis of renal failure present     3% Age 61     2% Current length of stay 2.155 days     2% Future appointment scheduled     1% Charlson Comorbidity Index 1     1% Active ulcer medication Rx order present      Readmitted Within the Last 30 Days? (No if blank)   Patient at risk for readmission?: Yes    Discharge Plan  Screen findings are: Discharge planning needs identified or anticipated (Comment).    Expected Discharge Date: 07/28/2021    Expected Transfer from Critical Care: N/A    Quality data for continuing care services shared with patient and/or representative?: Yes  Patient and/or  family were provided with choice of facilities / services that are available and appropriate to meet post hospital care needs?: Yes   List choices in order highest to lowest preferred, if applicable. : [facility]    Initial Assessment complete?: Yes

## 2021-07-27 NOTE — Unmapped (Signed)
Patient arrived from the ED alert and oriented x4. Patient was hypertensive when arriving on the unit. This nurse contacted provider. Provider ordered 10 mg of labetalol. Administered labetalol. Will continue to monitor.   Problem: Infection  Goal: Absence of Infection Signs and Symptoms  Outcome: Progressing     Problem: Pain Acute  Goal: Acceptable Pain Control and Functional Ability  Outcome: Progressing     Problem: Hypertension Acute  Goal: Blood Pressure Within Desired Range  Outcome: Progressing

## 2021-07-27 NOTE — Unmapped (Signed)
Physical Medicine and Rehab  Consult Note    Requesting Attending Physician: De-Vaughn Sima Matas, MD  Service Requesting Consult: Med General [facility] (MDW)    ASSESSMENT / RECOMMENDATIONS:     Malayah Russell is a 45 y.o. female with past medical history of uncontrolled type 2 diabetes, polyneuropathy, hypothyroidism,  psoriasis complicated by arthritis, fibromyalgia, and sleep apnea admitted to [facility] 12/20 for severe hyperglycemia and lactic acidosis and nephrotic range proteinuria along with worsening left sided weakness, urinary retention and saddle anesthesia.  The patient is seen in consultation for evaluation of rehabilitation needs.    Functional Impairment  Left leg weakness, urinary retention requiring foley in the setting of neck and low back pain.    Although severe hyperglycemia can cause weakness and urinary retention, Recommend MRI brain, cervical, thoracic and lumbar region to evaluate for spinal stenosis or other central process causing weakness and numbness.     Patient may also have plexopathy and has know neuropathy, so EMG/NCS could be considered after MRI, however due to body habitus, may be more challenging to perform. Would have to wait at least 2 weeks after leg weakness started to consider doing study.     - Please continue to have patient work with PT and OT to maximize functional status with mobility and ADLs.    Functional Goals  Get stronger and walk    Rehabilitation Plan of Care    - Patient has complex rehab, nursing, and medical needs and may become appropriate for Acute Inpatient Rehabilitation with regular therapy participation / demonstrated progress, completion of medical workup / formulation of treatment plan and confirmation of necessary assistance at home.  In AIR the patient would be expected to tolerate minimum of 3 hrs of therapy daily, 5x/week.  - Due to the shared treatment spaces inherent to inpatient rehabilitation, [facility] is now requiring a negative COVID-19 test prior to admission.  The patient had a negative COVID-19 test on 12/19.    *This PM&R consult does not guarantee that patient has been accepted to [facility], but can be helpful to guide patient progression.*  In order for the patient to be considered for admission to [facility] inpatient rehab, the case manager must give the patient / family choice in post-acute discharge options AND if the patient desires [facility], place a referral order to [facility] inpatient rehab. A referral for [facility] inpatient rehab has been received. The case manager may contact the Rehab Intake Coordinator with questions about acceptance to [facility],  bed availability and insurance authorization.     Darrick Grinder, MD    Thank you for this consult.  Please contact the PM&R consult pager [phone]) for questions regarding these recommendations.  For questions of bed availability at [facility], contact the Intake Office at [phone].      SUBJECTIVE:     Reason for Consult: Patient seen in consultation at the request of De-Vaughn Sima Matas, MD for evaluation of rehabilitation needs and recommendations.  Chief Complaint: weakness, numbness  History of Present Illness: Summer Russell is a 45 y.o. 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  Patient states she has been getting weaker since being on bedrest for left achilles tear a few months ago. She has been mainly wheelchair bound but is able to do stand  pivot transfers. She also had back pain and saddle anesthesia for the past few months, but imaging deferred by PCP due to size.  More recently since her last infliximab infusion, she has had worsening left leg weakness and swelling. She was also found to be retaining urine requiring foley placement. She states she has scattered numbness in arms and body in addition to feet.     Prior Functional Status:    Prior Functional Status: Patient was ambulatory roughly 1 year ago but tore her achilles and was placed on bedrest for 6 months before starting OP PT. Patient currently unable to take more than 5 steps before needing to sit down, uses WC when she leaves her home but primarily stays in her bedroom. 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    Current Functional Status: Therapy notes reviewed and analyzed     Activities of Daily Living:   Assessment  Problem List: Impaired sensation, Increased edema, Impaired ADLs, Fall risk, Decreased mobility, Decreased strength  Clinical Decision Making: Moderate  Assessment: Summer Russell is a 45 y.o. female with history of significant rheumatologic diseases, diabetes, thyroid disease, psoriatic arthritis, panniculitis, hydradenitis on clindamycin at home [name] for shortness of breath and generalized weakness after receiving Renflexis infusion. PVL lower extremity duplex negative.  Lactate is elevated. Pt is presenting to OT with decreased functional mobility, strength, and impaired LLE sensation impacting ADL performance. Pt was able to complete sit<>stand and small side steps this date with min A x2 + RW. She is highly motivated to participate in therapy and desires to return to walking as she did before her L achilles injury. She was willing to work with therapy this date despite pain and frequent bowel urgency. This indicates pt is appropriate for post-acute OT 5x/wk (high intensity).  Recommend acute OT 3-4x/wk to maximize safety and functional independence.  Today's Interventions: Other  Today's Interventions: Pt educated on role of OT, POC. Pt completed sup<>sit, EOB sitting tolerance, sit<>stand, standing tolerance, side steps towards HOB              Mobility:   Bed Mobility: supine to sit SBA, sit to supine min A for LEs     Balance comment: static sitting EOB supervision, static standing min A x 2 with RW  Gait: Patient able to march in place with RW and min A, decreased foot clearance L > R 2/2 weakness  PT Post Acute Discharge Recommendations: 5x weekly, High intensity    Cognition, Swallow, Speech:   Cognition / Swallow / Speech  Patient's Vision Adequate to Safely Complete Daily Activities: No  Patient's Judgement Adequate to Safely Complete Daily Activities: No  Patient's Memory Adequate to Safely Complete Daily Activities: No  Patient Able to Express Needs/Desires: Yes  Patient has speech problem: No             Assistive Devices: Rollator, Wheelchair-manual    Precautions:  Safety Interventions  Safety Interventions: fall reduction program maintained    Medical / Surgical History:   Past Medical History:   Diagnosis Date   ??? CKD (chronic kidney disease) 07/25/2021   ??? Diabetes mellitus (CMS-HCC)    ??? Disease of thyroid gland    ??? Fibromyalgia    ??? Hydradenitis    ??? Psoriasis    ??? Psoriatic arthritis (CMS-HCC)    ???  Sleep apnea      Past Surgical History:   Procedure Laterality Date   ??? IR INSERT PORT AGE GREATER THAN 5 YRS  11/06/2019    IR INSERT PORT AGE GREATER THAN 5 YRS 11/06/2019 Soledad Gerlach, MD IMG VIR [facility]   ??? SKIN BIOPSY     ??? TONSILLECTOMY          Social History:   Social History     Tobacco Use   ??? Smoking status: Former     Packs/day: 2.00     Years: 20.00     Pack years: 40.00     Types: Cigarettes     Quit date: 2013     Years since quitting: 9.9   ??? Smokeless tobacco: Never   Substance Use Topics   ??? Alcohol use: No   ??? Drug use: No       Living Environment: House  Lives With: Spouse  Home Living: One level home, Ramped entrance, Standard height toilet, Walk-in shower (ramps are too narrow for WC, grab bar in door frame near toilet, shower is too small to fully fit a shower chair)    Family History: Reviewed and non-contributory to rehab needs  family history includes Diabetes in her father and mother; Hypertension in her father and mother. She was adopted.    Allergies:   Venom-honey bee, Lamictal [lamotrigine], and Oxycodone    Medications:   Scheduled   ??? aspirin chewable tablet 81 mg Daily   ??? atorvastatin (LIPITOR) tablet 20 mg Daily   ??? bumetanide (BUMEX) tablet 1 mg BID   ??? cetirizine (ZyrTEC) tablet 10 mg Daily   ??? cholecalciferol (vitamin D3 25 mcg (1,000 units)) tablet 25 mcg Daily   ??? clindamycin (CLEOCIN) capsule 300 mg BID   ??? diclofenac sodium (VOLTAREN) 1 % gel 2 g QID   ??? empagliflozin (JARDIANCE) tablet 10 mg Daily   ??? enoxaparin (LOVENOX) syringe 40 mg Q12H   ??? finasteride (PROSCAR) tablet 5 mg Daily   ??? insulin lispro (HumaLOG) injection 0-20 Units ACHS   ??? levothyroxine (SYNTHROID) tablet 25 mcg Once per day on Tue Thu   ??? levothyroxine (SYNTHROID) tablet 50 mcg Daily   ??? lisinopriL (PRINIVIL,ZESTRIL) tablet 20 mg Daily   ??? magnesium sulfate 2gm/76mL IVPB Once   ??? nystatin (MYCOSTATIN) powder 1 application BID   ??? pantoprazole (PROTONIX) EC tablet 40 mg Daily     PRN acetaminophen, 1,000 mg, Q8H PRN  dextrose in water, 12.5 g, Q10 Min PRN  senna, 1 tablet, BID PRN   And  docusate sodium, 100 mg, BID PRN  glucagon, 1 mg, Once PRN  glucose, 16 g, Q10 Min PRN  meclizine, 25 mg, TID PRN  ondansetron, 4 mg, Q6H PRN  polyethylene glycol, 17 g, Daily PRN      Continuous Infusions      Review of Systems:    General ROS: positive for  - fatigue and dizziness  Psychological ROS: negative  Ophthalmic ROS: negative  Respiratory ROS: no cough, shortness of breath, or wheezing  Cardiovascular ROS: no chest pain or dyspnea on exertion  Gastrointestinal ROS: no abdominal pain, change in bowel habits, or black or bloody stools  Genito-Urinary ROS: positive for - retention  Musculoskeletal ROS: positive for - gait disturbance, muscular weakness and pain in back - lower  Neurological ROS: no TIA or stroke symptoms  Dermatological ROS: negative  Full 10 systems reviewed and neg, unless noted in HPI  OBJECTIVE:  Vitals:  Temp:  [35.4 ??C (95.7 ??F)-37.1 ??C (98.7 ??F)] 36.5 ??C (97.7 ??F)  Heart Rate:  [96-112] 96  Resp:  [18-20] 18  BP: (132-202)/(73-102) 177/85  MAP (mmHg):  [112-138] 112  SpO2:  [95 %-100 %] 97 %  BMI (Calculated):  [61.2] 61.2    Physical Exam:    GEN: Lying in bed in NAD.  HEENT: Atraumatic. Normocephalic. Moist mucous membranes. Trachea midline.  RESP: NWOB on RA.  CV: RRR, + leg edema edema  GI: abd soft, NTND  GU: + Foley   SKIN: no rashes or ecchymoses on exposed skin  MSK:  no notable contractures, no visible swelling or erythema over joints, joints NTTP  NEURO:  Mental Status: A&Ox3, attention intact, speech fluid and coherent, follows commands  well  Visual acuity intact. Visual fields intact to confrontation. Pupils equal. Extra ocular movements intact. Facial sensation intact bilaterally. No facial droop. Tongue protrudes midline.  Sensory: BUE and BLE sensation decreased in hands and lower leg  Motor:     RUE/LUE: shoulder abd 4+/4+, biceps 4+/4+, triceps 5/5, wrist extension 5/5, hand grasp 4+/4+    RLE/LLE: hip flexion 3-/2+, knee extension 3+/3-, DF 4/3, 1st toe extension 4/3 PF 4/3  Tone: within normal limits, no spasticity noted  Reflexes: no clonus, bilateral  Cerebellar: no abnml or extraneous mvmts, FNF wnl no dysmetria, no pronator drift  PSYCH: mood euthymic, affect appropriate, thought process logical     Labs and Diagnostic Studies: Reviewed   CBC - Results in Past 2 Days  Result Component Current Result   WBC 3.0 (L) (07/27/2021)   RBC 3.47 (L) (07/27/2021)   HGB 10.4 (L) (07/27/2021)   HCT 30.9 (L) (07/27/2021)   MCV 89.1 (07/27/2021)   MCH 30.1 (07/27/2021)   MCHC 33.8 (07/27/2021)   MPV 8.7 (07/27/2021)   Platelet 201 (07/27/2021)     BMP - Results in Past 2 Days  Result Component Current Result   Sodium 140 (07/27/2021)       Potassium 3.6 (07/27/2021)       Chloride 107 (07/27/2021)   CO2 20.0 (07/27/2021)   BUN 15 (07/27/2021)   Creatinine 1.06 (H) (07/27/2021)   EST.GFR (MDRD) Not in Time Range   Glucose 244 (H) (07/27/2021)     Coagulation -   No results found for requested labs within last 2 days.     Cardiac markers -   No results found for requested labs within last 2 days.     LFT's - Results in Past 2 Days  Result Component Current Result   Albumin 1.9 (L) (07/27/2021)   ALT 23 (07/27/2021)   AST 49 (H) (07/27/2021)   Alkaline Phosphatase 49 (07/27/2021)   Total Bilirubin 0.3 (07/27/2021)   Bilirubin, Direct Not in Time Range    Not in Time Range       Radiology Results: Reviewed   ECG 12 Lead    Result Date: 07/25/2021  SINUS TACHYCARDIA CANNOT RULE OUT ANTERIOR INFARCT  (CITED ON OR BEFORE 09-Oct-2017) ABNORMAL ECG WHEN COMPARED WITH ECG OF 09-Oct-2017 00:05, T WAVE INVERSION NOW EVIDENT IN LATERAL LEADS Confirmed by Mariane Baumgarten ([id number]) on 07/25/2021 5:34:03 PM    XR Chest Portable    Result Date: 07/24/2021  EXAM: XR CHEST PORTABLE DATE: 07/24/2021 8:22 PM ACCESSION: [id number] UN DICTATED: 07/24/2021 8:32 PM INTERPRETATION LOCATION: [facility]     CLINICAL INDICATION: 45 year old female with dyspnea.      COMPARISON: October 08, 2017.  TECHNIQUE: Portable Chest Radiograph.     FINDINGS:     Right IJ Port-A-Cath with tip terminating at the cavoatrial junction.     No focal consolidation, pleural fluid, or pneumothorax.             No acute findings.    CT Abdomen Pelvis W Contrast    Result Date: 07/25/2021  EXAM: CT ABDOMEN PELVIS W CONTRAST DATE: 07/25/2021 11:38 AM ACCESSION: [id number] UN DICTATED: 07/25/2021 11:50 AM INTERPRETATION LOCATION: [facility]     CLINICAL INDICATION: 45 years old with concern for pancreatitis.      COMPARISON: CTA chest 07/24/2021     TECHNIQUE: A helical CT scan of the abdomen and pelvis was obtained following IV contrast from the lung bases through the pubic symphysis. Images were reconstructed in the axial plane. Coronal and sagittal reformatted images were also provided for further evaluation.     FINDINGS:     LOWER CHEST: Partially imaged central venous catheter with tip terminating in the right atrium. The visualized lung fields are clear. No pericardial effusion.     LIVER: Normal liver contour.  No focal liver lesions.     BILIARY: No biliary ductal dilatation.  The gallbladder is normal in appearance.     SPLEEN: Normal in size and contour.     PANCREAS: Normal pancreatic contour.  No focal lesions.  No ductal dilation. No peripancreatic fluid collection or and surrounding inflammatory stranding.     ADRENAL GLANDS: The right adrenal gland is unremarkable. 0.9 cm hypoattenuating nodule at the left adrenal gland (4:23).     KIDNEYS/URETERS: Symmetric renal enhancement.  No hydronephrosis. No enhancing renal masses.     BLADDER: Unremarkable.     REPRODUCTIVE ORGANS: Unremarkable.     GI TRACT: No findings of bowel obstruction or acute inflammation.  Normal appendix. Scattered colonic diverticulum without evidence of diverticulitis.     PERITONEUM, RETROPERITONEUM AND MESENTERY: No free air.  No ascites.  No fluid collection.     LYMPH NODES: Scattered prominent and mildly enlarged lymph nodes, representative nodes as follows: -1 cm portacaval lymph node (4:43). -1.1 cm periaortic node (4:62). -1 cm right pelvic sidewall lymph node (4:128). -Bilateral 1 cm external iliac chain lymph nodes (4:133, 138).     These are indeterminate but could be reactive.     VESSELS: Normal caliber aorta. Portal venous system is patent. The hepatic veins are patent. The IVC is unremarkable.     BONES and SOFT TISSUES: No aggressive osseous lesions. Dominant Soft tissue edema/stranding within the anterior lower abdominal wall pannus with associated anterior skin thickening. No focal soft tissue lesions.         --No evidence of acute pancreatitis is clinically questioned.     --Diffuse soft tissue edema, fatty stranding, and associated skin thickening at the anterior inferior abdominal wall pannus. Findings may reflect edema versus cellulitis. Recommend clinical correlation.     --Scattered mildly enlarged lymph nodes throughout the retroperitoneum and pelvis, findings are nonspecific and may be reactive in nature. Recommend clinical correlation.     --Subcentimeter hypoattenuating nodule at the left adrenal gland, given size findings are likely benign. Recommend attention on follow-up imaging.     --Additional chronic and incidental findings, as above.    ED POCUS Chest Bilateral    Result Date: 07/24/2021  Limited Cardiac Ultrasound (CPT [phone])     Indication: A focused ultrasound exam of the heart was performed to evaluate for pericardial effusion, tamponade, severe hypovolemia, or  gross abnormalities of cardiac anatomy or function in this patient. The ultrasound was performed with the following indications, as noted in the H&P: Dyspnea, Concern for effusion, and Other indications as noted in the H&P     Identified structures: The pericardial sac, myocardium, and 4 chambers were identified using the following views: parasternal long axis and parasternal short axis     Findings: Exam of the above structures revealed the following findings:      Pericardial effusion: Absent   Pericardial tamponade: N/A  Global LV function: Normal  Right ventricular size: Normal   Signs of RV strain: N/A  IVC: Normal      Other findings: Large anterior fat pad     Limitations: None.     Impression: Normal cardiac ultrasound     No sonographic evidence of significant cardiac dysfunction and No sonographic evidence of significant pericardial effusion     Other: None     Interpreted by: Raechel Ache, MD     Quality Assurance  After review of the point-of-care ultrasound performed in this case I assess the overall image quality as: Image quality: Minimal criteria met for diagnosis, recognizable structures but with some technical or other flaws  The accuracy of interpretation of images as presented reflects a true negative.     This study does meet minimum criteria for credentialing and billing.     Erle Crocker, MD             Echocardiogram W Colorflow Spectral Doppler With Contrast    Result Date: 07/25/2021  Patient Info Name:     NAIA RUFF DUKE Russell Age:     45 years DOB:     1976/03/22 Gender:     Female MRN:     [id number] Accession #:     [id number] UN Ht:     168 cm Wt:     170 kg BSA:     2.93 m2 Technical Quality:     Poor Exam Date:     07/25/2021 2:00 PM Site Location:     [facility] Exam Location:     [facility] Admit Date:     07/24/2021     Exam Type:     ECHOCARDIOGRAM W COLORFLOW SPECTRAL DOPPLER WITH CONTRAST     Study Info Indications      - cardiomegaly,  establish EF     Complete two-dimensional, color flow and Doppler transthoracic echocardiogram is performed with contrast to opacify the left ventricle and to improve the delineation of the left ventricle endocardial borders.     Staff Referring Physician:     [phone], [email or web address]; Reading Fellow:     Malka So MD Sonographer:     Redmond Pulling Ordering Physician:     Benjaman Kindler     Account #:     [id number]     Ultrasound Enhancing Agent/Agitated Saline     ------------------------------ UEA/Ag. Saline:     Lumason Amount:     --- ml     Reason for Poor Study:     poor echocardiographic windows         Summary   1. The left ventricle is normal in size with mildly increased wall thickness.   2. The left ventricular systolic function is normal, LVEF is visually estimated at > 55%.   3. Mitral annular calcification is present (mild).   4. The mitral valve leaflets are mildly thickened with normal leaflet mobility.   5. The right ventricle is not well  visualized but probably normal in size, with normal systolic function.   6. Technically difficult study.   7. An ultrasound enhancing agent was used to improve the visualization of the left ventricular cavity and endocardial borders.         Left Ventricle The left ventricle is normal in size with mildly increased wall thickness.   The left ventricular systolic function is normal, LVEF is visually estimated at > 55%.   Left ventricular diastolic function cannot be accurately assessed.     Right Ventricle   Right ventricle is not well visualized.   The right ventricle is not well visualized but probably normal in size, with normal systolic function.         Left Atrium   The left atrium is normal in size.     Right Atrium   The right atrium is normal  in size.         Aortic Valve   The aortic valve is trileaflet with normal appearing leaflets with normal excursion.   There is no significant aortic regurgitation.   There is no evidence of a significant transvalvular gradient.     Pulmonic Valve   The pulmonic valve is poorly visualized, but probably normal.   There is no significant pulmonic regurgitation.   There is no evidence of a significant transvalvular gradient.     Mitral Valve   Mitral annular calcification is present (mild).   The mitral valve leaflets are mildly thickened with normal leaflet mobility.   There is trivial mitral valve regurgitation.     Tricuspid Valve   The tricuspid valve leaflets are poorly visualized but probably normal, with normal leaflet mobility.   There is trivial tricuspid regurgitation.   The pulmonary systolic pressure cannot be estimated due to insufficient TR signal.         Pericardium/Pleural   There is no pericardial effusion.     Inferior Vena Cava   IVC size and inspiratory change suggest normal right atrial pressure. (0-5 mmHg).     Aorta   The aorta is normal in size in the visualized segments.         Pulmonic Valve ---------------------------------------------------------------------- Name                                 Value        Normal ----------------------------------------------------------------------     PV Doppler ---------------------------------------------------------------------- PV Peak Velocity 1.3 m/s     Tricuspid Valve ---------------------------------------------------------------------- Name                                 Value        Normal ----------------------------------------------------------------------     Estimated PAP/RSVP ---------------------------------------------------------------------- RA Pressure                         3 mmHg           <=5     Aorta ---------------------------------------------------------------------- Name                                 Value        Normal ----------------------------------------------------------------------     Ascending Aorta ---------------------------------------------------------------------- Ao Root Diameter (2D)               3.2  cm               Ao Root Diam Index (2D)         10.9 cm/m2     Venous ---------------------------------------------------------------------- Name                                 Value        Normal ----------------------------------------------------------------------     IVC/SVC ---------------------------------------------------------------------- IVC Diameter (Exp 2D)               1.4 cm         <=2.1     Aortic Valve ---------------------------------------------------------------------- Name                                 Value        Normal ----------------------------------------------------------------------     AV Doppler ---------------------------------------------------------------------- AV Peak Velocity                   1.3 m/s     Ventricles ---------------------------------------------------------------------- Name                                 Value        Normal ----------------------------------------------------------------------     LV Dimensions 2D/MM ---------------------------------------------------------------------- IVS Diastolic Thickness (2D)        1.3 cm       0.6-0.9 LVID Diastole (2D)                  3.7 cm       3.8-5.2 LVPW Diastolic Thickness (2D) 1.3 cm       0.6-0.9 LVID Systole (2D)                   2.9 cm       2.2-3.5     Atria ---------------------------------------------------------------------- Name                                 Value        Normal ----------------------------------------------------------------------     LA Dimensions ---------------------------------------------------------------------- LA Dimension (2D)                   3.3 cm       2.7-3.8         Report Signatures Finalized by Andrey Campanile  MD on 07/25/2021 06:37 PM Resident Jesse Sans Daubert  MD on 07/25/2021 03:37 PM    PVL Venous Duplex Lower Extremity Bilateral    Result Date: 07/25/2021   Peripheral Vascular Lab     [address]   [place], [place] [address]  PVL VENOUS DUPLEX LOWER EXTREMITY BILATERAL Patient Demographics Pt. Name: Summer Russell Location: Emergency Department MRN:      [id number]             Sex:      F DOB:      10-Jun-1976            Age:      55 years  Study Information Authorizing         [phone] Rosiland Oz      Performed Time       07/24/2021 Provider Name  GAFFNEY                                   6:53:48 PM Ordering Physician  Raechel Ache     Patient Location     [facility] Accession Number    [id number] UN        Technologist         Eli Phillips                                                               RVT Diagnosis:                               Assisting            Nanda Quinton,                                          Technologist         RVT Ordered Reason For Exam: concern for DVT Indication: SOB, weakness Risk Factors: Limited mobility and (obesity). Protocol The major deep veins from the inguinal ligament to the ankle are assessed for bilaterally for compressibility and color and spectral Doppler flow characteristics. The assessed veins include bilateral common femoral vein, femoral vein in the thigh, popliteal vein, and intramuscular calf veins. The iliac vein is assessed indirectly using Doppler waveform analysis. The great saphenous vein is assessed for compressibility at the saphenofemoral junction, and the small saphenous vein assessed for compressibility behind the knee. Limitations: Body habitus and poor ultrasound/tissue interface.  Right Duplex Findings All veins visualized appear fully compressible. Doppler flow signals demonstrate normal spontaneity, phasicity, and augmentation. Not visualized segments included the proximal calf veins due to the noted limitations.  Left Duplex Findings All veins visualized appear fully compressible. Doppler flow signals demonstrate normal spontaneity, phasicity, and augmentation. Not visualized segments included the proximal calf veins due to the noted limitations. Unable to demonstrate compression at the left common femoral vein due to body habitus/anatomy; Doppler waveforms at this segment are WNL.  Right Technical Summary No evidence of deep venous obstruction in the lower extremity. No indirect evidence of obstruction proximal to the inguinal ligament. Not visualized segments included the proximal calf veins due to the noted limitations.  Left Technical Summary No evidence of deep venous obstruction in the lower extremity. No indirect evidence of obstruction proximal to the inguinal ligament. Not visualized segments included the proximal calf veins due to the noted limitations.  Final Interpretation Right There is no evidence of DVT in the lower extremity. However, portions of this examination were limited- see technologist comments above. There is no evidence of obstruction proximal to the inguinal ligament or in the common femoral vein. Left There is no evidence of DVT in the lower extremity. However, portions of this examination were limited- see technologist comments above. There is no evidence of obstruction proximal to the inguinal ligament or in the common femoral vein.  Electronically signed by [id number] Jodell Cipro MD on 07/25/2021 at 6:42:25 AM.   Final CTA Chest W Contrast  Result Date: 07/25/2021  EXAM: CTA Chest for Pulmonary Embolus DATE: 07/24/2021 11:52 PM ACCESSION: [id number] UN DICTATED: 07/25/2021 1:44 AM INTERPRETATION LOCATION: Main Campus     CLINICAL INDICATION: 45 years old Female with Tachycardia + dyspnea, concern for PE      COMPARISON: Chest radiograph 07/24/2021     TECHNIQUE: Contiguous 1 and 2 mm axial images were reconstructed through the chest following a single breath hold helical acquisition.  Images were reformatted in the sagittal and coronal planes.  Multiplanar MIP slabs were created for more thorough evaluation of the pulmonary vasculature. Intravenous contrast was administered.     The examination was performed with limited z-axis coverage and reduced kVp to improve vascular opacification and reduce breath hold and contrast and radiation dose. kVP:  100 DLP:  437.9         FINDINGS:     PULMONARY ARTERIES: No emboli in either lung.  The right heart is not dilated.     LUNGS AND AIRWAYS: The lungs are clear.. The central airways are patent.     PLEURA: No pleural fluid or pneumothorax.,     MEDIASTINUM AND LYMPH NODES: No enlarged intrathoracic or axillary lymph nodes are present.    No other mediastinal abnormalities are present.     HEART AND VASCULATURE: Cardiomegaly. Ascending and descending aorta normal in caliber.    There is no pericardial effusion. Right IJ chest wall Port-A-Cath with tip terminating in the right atrium. Coronary calcifications.     BONES AND SOFT TISSUES: No significant abnormalities.     UPPER ABDOMEN: Mild hepatosplenomegaly, otherwise unremarkable.                 No emboli and no acute findings.     Coronary calcifications.                   For coding purposes:   - This patient was seen by the provider Darrick Grinder, MD).  - This encounter should be coded as a an inpatient consultation.

## 2021-07-27 NOTE — Unmapped (Cosign Needed)
Internal Medicine (MEDW) Progress Note    Assessment & Plan:   Summer Russell is a 45 y.o. 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     Principal Problem:    Lactic acidosis  Active Problems:    Hypertension    Hyperglycemia due to type 2 diabetes mellitus (CMS-HCC)    Proteinuria    CKD (chronic kidney disease)    Hyponatremia    Axillary hidradenitis suppurativa    Psoriasis    Hypothyroidism    Hyperlipidemia  Resolved Problems:    * No resolved hospital problems. *    Weakness - Left leg sensory deficits - Urinary retention - Back pain  PM&R consulted, recommending neuroimaging for further evaluation. Initial UA w/o evidence of UTI.  - MRI w/o contrast of brain and full spine ordered   - Urinary catheter placed 12/21 w/ >1 L UOP  - [facility] once initial work-up completed     Hyperglycemia - Uncontrolled T2DM  BG elevated to >400 on admission (no AG or acidemia to suggest DKA), with patient unclear of diabetic regimen at home. Continuing to titrate insulin regimen.  - Lantus 40 U qhs  - Continue correctional lispro  - Holding metformin  - Anticipate outpatient referral to [facility] endocrinology (pt's request)  - Start Jardiance 10 mg daily     Elevated lactate, resolved  Lactate peaked at 5.8, now downtrended. No clear infectious source, with blood cultures NGTD and with skin exam consistent with intertrigo but no clear signs of cellulitis.   - Stop cefpime, with blood cx clear for 48 hours   - Holding metformin  - Derm consulted re: possible link to infliximab infusion (unlikely)  ????  Concern for vertigo  She reports spinning sensation worsened with movement, with some associated nausea. Previously had head CT at [facility] for similar symptoms, and husband reports she was on outpatient meclizine at some point.   - Trial of one-time compazine dose, then prn meclizine     Nephrotic range proteinuria - AKI  Most concerned for diabetic nephropathy, but also sent additional labs (infectious studies, complements) for further work-up, with C4 slightly low at 9.1. She has known lymphedema, but she does report worsened edema compared to baseline.   - Nephrology consulted, welcome recs  - Lisinopril 20 mg daily  - PO Bumex 1 mg bid     Intertrigo  Exam consistent with intertrigo under pannus; no clear cellulitic component.   - BID nystatin powder    Hydradenitis suppurativa - Psoriasis   Managed by dermatology outpatient; on IV Renflexis, finasteride, and clindamycin. She may have been taking Cosentyx, but she is unsure of dose and frequency.  - S/p Renflexis infusion 12/19  - Continue clindamycin, finasteride    Cardiomegaly on CT  TTE technically difficult, but LVEF estimated at >55% with mildly increased wall thickness. Low suspicion for cardiac etiology of edema.    Hypothyroidism - Continue home Synthroid 50 mcg every day + 25 mcg T/Th  HTN - Lisinopril and Bumex as above   HLD - Continue home Lipitor  Sleep Apnea - CPAP at bedtime  GERD - Continue home PPI  ??  Daily Checklist:  Diet: Carbohydrate Restricted  DVT PPx: Lovenox  Electrolytes: Replete prn  Code Status: Full Code    Team Contact Information:   Primary Team: Internal Medicine (MEDW)  Primary Resident: Starleen Blue, MD  Resident's Pager: [phone] (Gen MedW Intern - Cliffton Asters)    Interval History:     Hypertensive ON, for which she was given one-time dose of labetolol.    This morning, she reports feeling a spinning sensation, worsened with certain movements. This was exacerbated by urinary catheter insertion yesterday. She has previously had this symptom as an outpatient, associated with history of headaches. Also associated with nausea. Continues to feel that her abdomen and legs are swollen.     All other systems were reviewed and are negative except as noted in the HPI    Objective: Temp:  [35.4 ??C (95.7 ??F)-37.1 ??C (98.7 ??F)] 35.4 ??C (95.7 ??F)  Heart Rate:  [88-112] 112  Resp:  [17-20] 20  BP: (132-202)/(73-102) 169/82  SpO2:  [95 %-100 %] 95 %    Gen: Sitting up in bed, appears uncomfortable but in NAD, answers questions appropriately.  Eyes: Sclera anicteric, EOMI.  HENT: Atraumatic, normocephalic.  Heart: RRR, S1, S2, no M/R/G.  Lungs: CTAB, no crackles or wheezes, no use of accessory muscles  Abdomen: No focal tenderness, soft.  Extremities: 2+ edema in legs, abdomen bilaterally.  Neuro: Unable to assess gait. Decreased sensation to light touch diffusely over distal foot, able to bend knee and do plantar flexion/extension.  Skin:  Intertrigo under pannus (did not examine today)  Psych: Alert, oriented, normal mood and affect.     Labs/Studies: Labs and Studies from the last 24hrs per EMR and Reviewed

## 2021-07-27 NOTE — Unmapped (Signed)
Acute Inpatient Rehab referral has been received and patient is currently under review.       Please be advised, [facility] is now located at the [facility].      Visitation Policy:  [facility] allows visitors (6 AM- 9 PM) and one visitor may be designated to stay overnight. The overnight visitor must be in the facility prior to 9 PM. No visitors under the age of 36 will be allowed to visit for the foreseeable future.      Please call admissions office with any pertinent updates. Thank you.        Janit Pagan, OTR/L   [facility]  Rehab Inpatient Coordinator  Office: [phone]    8:20 AM 07/27/2021

## 2021-07-27 NOTE — Unmapped (Signed)
Bed: 80-D  Expected date:   Expected time:   Means of arrival:   Comments:  Team P Patient

## 2021-07-28 DIAGNOSIS — R809 Proteinuria, unspecified: Principal | ICD-10-CM

## 2021-07-28 LAB — CBC
HEMATOCRIT: 32.4 % — ABNORMAL LOW (ref 34.0–44.0)
HEMOGLOBIN: 11.4 g/dL (ref 11.3–14.9)
MEAN CORPUSCULAR HEMOGLOBIN CONC: 35.3 g/dL (ref 32.0–36.0)
MEAN CORPUSCULAR HEMOGLOBIN: 31.5 pg (ref 25.9–32.4)
MEAN CORPUSCULAR VOLUME: 89.1 fL (ref 77.6–95.7)
MEAN PLATELET VOLUME: 8.5 fL (ref 6.8–10.7)
PLATELET COUNT: 220 10*9/L (ref 150–450)
RED BLOOD CELL COUNT: 3.63 10*12/L — ABNORMAL LOW (ref 3.95–5.13)
RED CELL DISTRIBUTION WIDTH: 13.3 % (ref 12.2–15.2)
WBC ADJUSTED: 3.4 10*9/L — ABNORMAL LOW (ref 3.6–11.2)

## 2021-07-28 LAB — COMPREHENSIVE METABOLIC PANEL
ALBUMIN: 2.1 g/dL — ABNORMAL LOW (ref 3.4–5.0)
ALKALINE PHOSPHATASE: 57 U/L (ref 46–116)
ALT (SGPT): 25 U/L (ref 10–49)
ANION GAP: 10 mmol/L (ref 5–14)
AST (SGOT): 37 U/L — ABNORMAL HIGH (ref <=34)
BILIRUBIN TOTAL: 0.3 mg/dL (ref 0.3–1.2)
BLOOD UREA NITROGEN: 13 mg/dL (ref 9–23)
BUN / CREAT RATIO: 11
CALCIUM: 8.6 mg/dL — ABNORMAL LOW (ref 8.7–10.4)
CHLORIDE: 105 mmol/L (ref 98–107)
CO2: 25 mmol/L (ref 20.0–31.0)
CREATININE: 1.15 mg/dL — ABNORMAL HIGH
EGFR CKD-EPI (2021) FEMALE: 60 mL/min/{1.73_m2} (ref >=60)
GLUCOSE RANDOM: 176 mg/dL (ref 70–179)
POTASSIUM: 3.7 mmol/L (ref 3.4–4.8)
PROTEIN TOTAL: 5.5 g/dL — ABNORMAL LOW (ref 5.7–8.2)
SODIUM: 140 mmol/L (ref 135–145)

## 2021-07-28 LAB — PHOSPHORUS: PHOSPHORUS: 4.3 mg/dL (ref 2.4–5.1)

## 2021-07-28 LAB — VITAMIN B1, WHOLE BLOOD: VITAMIN B1: 145 nmol/L

## 2021-07-28 LAB — MAGNESIUM: MAGNESIUM: 2 mg/dL (ref 1.6–2.6)

## 2021-07-28 LAB — SERUM FREE LIGHT CHAINS
K/L FLC RATIO: 1.24 (ref 0.26–1.65)
KAPPA FREE,SERUM: 5.18 mg/dL — ABNORMAL HIGH (ref 0.33–1.94)
LAMBDA FREE, SER: 4.18 mg/dL — ABNORMAL HIGH (ref 0.57–2.63)

## 2021-07-28 MED ADMIN — insulin glargine (LANTUS) injection 40 Units: 40 [IU] | SUBCUTANEOUS | @ 03:00:00

## 2021-07-28 MED ADMIN — aspirin chewable tablet 81 mg: 81 mg | ORAL | @ 14:00:00

## 2021-07-28 MED ADMIN — finasteride (PROSCAR) tablet 5 mg: 5 mg | ORAL | @ 14:00:00

## 2021-07-28 MED ADMIN — LORazepam (ATIVAN) tablet 0.5 mg: .5 mg | ORAL | @ 22:00:00 | Stop: 2021-07-28

## 2021-07-28 MED ADMIN — bumetanide (BUMEX) tablet 1 mg: 1 mg | ORAL | @ 11:00:00

## 2021-07-28 MED ADMIN — insulin lispro (HumaLOG) injection 0-20 Units: 0-20 [IU] | SUBCUTANEOUS | @ 18:00:00

## 2021-07-28 MED ADMIN — prochlorperazine (COMPAZINE) injection 5 mg: 5 mg | INTRAVENOUS | @ 17:00:00

## 2021-07-28 MED ADMIN — enoxaparin (LOVENOX) syringe 40 mg: 40 mg | SUBCUTANEOUS | @ 03:00:00

## 2021-07-28 MED ADMIN — insulin lispro (HumaLOG) injection 0-20 Units: 0-20 [IU] | SUBCUTANEOUS | @ 03:00:00

## 2021-07-28 MED ADMIN — pantoprazole (PROTONIX) EC tablet 40 mg: 40 mg | ORAL | @ 14:00:00

## 2021-07-28 MED ADMIN — cholecalciferol (vitamin D3 25 mcg (1,000 units)) tablet 25 mcg: 25 ug | ORAL | @ 14:00:00

## 2021-07-28 MED ADMIN — clindamycin (CLEOCIN) capsule 300 mg: 300 mg | ORAL | @ 14:00:00 | Stop: 2021-08-08

## 2021-07-28 MED ADMIN — insulin lispro (HumaLOG) injection 0-20 Units: 0-20 [IU] | SUBCUTANEOUS | @ 14:00:00

## 2021-07-28 MED ADMIN — bumetanide (BUMEX) tablet 1 mg: 1 mg | ORAL | @ 18:00:00

## 2021-07-28 MED ADMIN — empagliflozin (JARDIANCE) tablet 10 mg: 10 mg | ORAL | @ 14:00:00

## 2021-07-28 MED ADMIN — meclizine (ANTIVERT) tablet 25 mg: 25 mg | ORAL | @ 11:00:00

## 2021-07-28 MED ADMIN — diclofenac sodium (VOLTAREN) 1 % gel 2 g: 2 g | TOPICAL | @ 17:00:00

## 2021-07-28 MED ADMIN — meclizine (ANTIVERT) tablet 25 mg: 25 mg | ORAL | @ 02:00:00

## 2021-07-28 MED ADMIN — enoxaparin (LOVENOX) syringe 40 mg: 40 mg | SUBCUTANEOUS | @ 14:00:00

## 2021-07-28 MED ADMIN — acetaminophen (TYLENOL) tablet 1,000 mg: 1000 mg | ORAL | @ 11:00:00

## 2021-07-28 MED ADMIN — levothyroxine (SYNTHROID) tablet 50 mcg: 50 ug | ORAL | @ 11:00:00

## 2021-07-28 MED ADMIN — lisinopriL (PRINIVIL,ZESTRIL) tablet 20 mg: 20 mg | ORAL | @ 14:00:00

## 2021-07-28 MED ADMIN — atorvastatin (LIPITOR) tablet 20 mg: 20 mg | ORAL | @ 14:00:00

## 2021-07-28 MED ADMIN — cetirizine (ZyrTEC) tablet 10 mg: 10 mg | ORAL | @ 14:00:00

## 2021-07-28 MED ADMIN — clindamycin (CLEOCIN) capsule 300 mg: 300 mg | ORAL | @ 03:00:00 | Stop: 2021-08-08

## 2021-07-28 MED ADMIN — nystatin (MYCOSTATIN) powder 1 application: 1 | TOPICAL | @ 14:00:00

## 2021-07-28 NOTE — Unmapped (Signed)
Internal Medicine (MEDW) Progress Note    Assessment & Plan:   Summer Russell is a 45 y.o. 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     Principal Problem:    Lactic acidosis  Active Problems:    Hypertension    Hyperglycemia due to type 2 diabetes mellitus (CMS-HCC)    Proteinuria    CKD (chronic kidney disease)    Hyponatremia    Axillary hidradenitis suppurativa    Psoriasis    Hypothyroidism    Hyperlipidemia  Resolved Problems:    * No resolved hospital problems. *    Weakness - Left leg sensory deficits - Urinary retention - Back pain  PM&R consulted, recommending neuroimaging for further evaluation.   - MRI w/o contrast of brain and full spine ordered, pending  *If patient does not fit in MRI, will discuss w/ PM&R whether CT of spine is adequate alternative  - Urinary catheter placed 12/21 w/ >1 L UOP, anticipate 1 week w/ catheter for possible bladder stretch injury  - [facility] once initial work-up completed     Headache - Vertigo  Reports that Zorfan and meclizine have not helped much thus far. Thinks compazine may have been helpful. She does report ~2 years of headaches, also vertigo that previously did not respond to meclizine, often worsened with lying flat; question whether she may have element of IIH (vs tension-type headaches, with frontal/band-like quality).   - Pending brain MRI  - May benefit from vestibular PT  - Prn IV compazine (first line), Zofran (second line)  - Prn Tylenol     Hyperglycemia - Uncontrolled T2DM  BG elevated to >400 on admission (no AG or acidemia to suggest DKA), with patient unclear of diabetic regimen at home. Continuing to titrate insulin regimen.  - Lantus increased to 45 U qhs  - Continue correctional lispro  - Holding metformin  - Anticipate outpatient referral to [facility] endocrinology (pt's request)  - Jardiance 10 mg daily     Elevated lactate, resolved  Lactate peaked at 5.8, now downtrended. No clear infectious source, with blood cultures NGTD and with skin exam consistent with intertrigo but no clear signs of cellulitis.   - Stopped cefpime, with blood still NGTD   - Holding metformin  - Derm consulted re: possible link to infliximab infusion (unlikely)  ????  Nephrotic range proteinuria - AKI  Most concerned for diabetic nephropathy, but also sent additional labs (infectious studies, complements) for further work-up, with C4 slightly low at 9.1. She has known lymphedema, but she does report worsened edema compared to baseline.   - Nephrology consulted, welcome recs  - Nephrology to place outpatient VIR consult for renal biopsy  - Lisinopril 20 mg daily  - PO Bumex 1 mg bid     Intertrigo  Exam consistent with intertrigo under pannus; no clear cellulitic component.   - BID nystatin powder    Hydradenitis suppurativa - Psoriasis   Managed by dermatology outpatient; on IV Renflexis, finasteride, and clindamycin. She may have been taking Cosentyx, but she is unsure of dose and frequency.  - S/p Renflexis infusion 12/19  - Continue clindamycin, finasteride    Cardiomegaly on CT  TTE technically difficult, but LVEF estimated at >55% with mildly increased wall thickness. Low suspicion for cardiac etiology of edema.    Hypothyroidism - Continue home Synthroid 50 mcg  every day + 25 mcg T/Th  HTN - Lisinopril and Bumex as above   HLD - Continue home Lipitor  Sleep Apnea - CPAP at bedtime  GERD - Continue home PPI  ??  Daily Checklist:  Diet: Carbohydrate Restricted  DVT PPx: Lovenox  Electrolytes: Replete prn  Code Status: Full Code    Team Contact Information:   Primary Team: Internal Medicine (MEDW)  Primary Resident: Starleen Blue, MD  Resident's Pager: [phone] (Gen MedW Intern - Cliffton Asters)    Interval History:   No acute overnight events.  This morning, the patient continues to report a headache and a spinning sensation, often worsened with movements or lying down.  States that meclizine did not help her symptoms.  She does feel less swollen now that she is on Bumex, especially noting improvement in her abdominal swelling.    She does report that the headache has been present for about 2 years now, probably longer.  Often worsened with movements and lying down flat.  Previously had a head CT performed at [facility], with no acute findings.    All other systems were reviewed and are negative except as noted in the HPI    Objective:   Temp:  [36.8 ??C (98.2 ??F)-37 ??C (98.6 ??F)] 36.8 ??C (98.2 ??F)  Heart Rate:  [100-105] 105  Resp:  [16] 16  BP: (152-186)/(91-96) 152/96  SpO2:  [98 %-99 %] 98 %    Gen: Sitting up in bed, appears uncomfortable but in NAD, answers questions appropriately.  Eyes: Sclera anicteric, EOMI.  HENT: Atraumatic, normocephalic.  Heart: RRR, S1, S2, no M/R/G.  Lungs: CTAB, no crackles or wheezes, no use of accessory muscles  Abdomen: No focal tenderness, soft.  Extremities: 1+ edema in legs, abdomen bilaterally. Improved.  Neuro: Unable to assess gait. Decreased sensation to light touch diffusely over distal foot, able to bend knee and do plantar flexion/extension.  Skin:  Intertrigo under pannus (did not examine today)  Psych: Alert, oriented, normal mood and affect.     Labs/Studies: Labs and Studies from the last 24hrs per EMR and Reviewed

## 2021-07-29 LAB — COMPREHENSIVE METABOLIC PANEL
ALBUMIN: 2.3 g/dL — ABNORMAL LOW (ref 3.4–5.0)
ALKALINE PHOSPHATASE: 64 U/L (ref 46–116)
ALT (SGPT): 26 U/L (ref 10–49)
ANION GAP: 12 mmol/L (ref 5–14)
AST (SGOT): 39 U/L — ABNORMAL HIGH (ref <=34)
BILIRUBIN TOTAL: 0.4 mg/dL (ref 0.3–1.2)
BLOOD UREA NITROGEN: 14 mg/dL (ref 9–23)
BUN / CREAT RATIO: 12
CALCIUM: 8.8 mg/dL (ref 8.7–10.4)
CHLORIDE: 104 mmol/L (ref 98–107)
CO2: 25 mmol/L (ref 20.0–31.0)
CREATININE: 1.14 mg/dL — ABNORMAL HIGH
EGFR CKD-EPI (2021) FEMALE: 61 mL/min/{1.73_m2} (ref >=60)
GLUCOSE RANDOM: 182 mg/dL — ABNORMAL HIGH (ref 70–179)
POTASSIUM: 3.7 mmol/L (ref 3.4–4.8)
PROTEIN TOTAL: 5.8 g/dL (ref 5.7–8.2)
SODIUM: 141 mmol/L (ref 135–145)

## 2021-07-29 LAB — CBC
HEMATOCRIT: 35.5 % (ref 34.0–44.0)
HEMOGLOBIN: 11.8 g/dL (ref 11.3–14.9)
MEAN CORPUSCULAR HEMOGLOBIN CONC: 33.1 g/dL (ref 32.0–36.0)
MEAN CORPUSCULAR HEMOGLOBIN: 29.7 pg (ref 25.9–32.4)
MEAN CORPUSCULAR VOLUME: 89.5 fL (ref 77.6–95.7)
MEAN PLATELET VOLUME: 8.8 fL (ref 6.8–10.7)
PLATELET COUNT: 251 10*9/L (ref 150–450)
RED BLOOD CELL COUNT: 3.96 10*12/L (ref 3.95–5.13)
RED CELL DISTRIBUTION WIDTH: 13.4 % (ref 12.2–15.2)
WBC ADJUSTED: 3.7 10*9/L (ref 3.6–11.2)

## 2021-07-29 LAB — URINE TOTAL PROTEIN FOR UPE: PROTEIN URINE: 506.1 mg/dL

## 2021-07-29 LAB — PHOSPHORUS: PHOSPHORUS: 4 mg/dL (ref 2.4–5.1)

## 2021-07-29 LAB — MAGNESIUM: MAGNESIUM: 2 mg/dL (ref 1.6–2.6)

## 2021-07-29 MED ADMIN — prochlorperazine (COMPAZINE) injection 5 mg: 5 mg | INTRAVENOUS | @ 19:00:00

## 2021-07-29 MED ADMIN — acetaminophen (TYLENOL) tablet 1,000 mg: 1000 mg | ORAL | @ 23:00:00

## 2021-07-29 MED ADMIN — atorvastatin (LIPITOR) tablet 20 mg: 20 mg | ORAL | @ 13:00:00

## 2021-07-29 MED ADMIN — aspirin chewable tablet 81 mg: 81 mg | ORAL | @ 13:00:00

## 2021-07-29 MED ADMIN — clindamycin (CLEOCIN) capsule 300 mg: 300 mg | ORAL | @ 13:00:00 | Stop: 2021-08-08

## 2021-07-29 MED ADMIN — enoxaparin (LOVENOX) syringe 40 mg: 40 mg | SUBCUTANEOUS | @ 13:00:00

## 2021-07-29 MED ADMIN — bumetanide (BUMEX) tablet 1 mg: 1 mg | ORAL | @ 12:00:00

## 2021-07-29 MED ADMIN — insulin glargine (LANTUS) injection 45 Units: 45 [IU] | SUBCUTANEOUS | @ 02:00:00

## 2021-07-29 MED ADMIN — prochlorperazine (COMPAZINE) injection 5 mg: 5 mg | INTRAVENOUS

## 2021-07-29 MED ADMIN — enoxaparin (LOVENOX) syringe 40 mg: 40 mg | SUBCUTANEOUS | @ 02:00:00

## 2021-07-29 MED ADMIN — bumetanide (BUMEX) tablet 1 mg: 1 mg | ORAL | @ 19:00:00

## 2021-07-29 MED ADMIN — empagliflozin (JARDIANCE) tablet 10 mg: 10 mg | ORAL | @ 15:00:00

## 2021-07-29 MED ADMIN — ondansetron (ZOFRAN) injection 8 mg: 8 mg | INTRAVENOUS | @ 04:00:00

## 2021-07-29 MED ADMIN — cetirizine (ZyrTEC) tablet 10 mg: 10 mg | ORAL | @ 13:00:00

## 2021-07-29 MED ADMIN — acetaminophen (TYLENOL) tablet 1,000 mg: 1000 mg | ORAL | @ 04:00:00

## 2021-07-29 MED ADMIN — clindamycin (CLEOCIN) capsule 300 mg: 300 mg | ORAL | @ 02:00:00 | Stop: 2021-08-08

## 2021-07-29 MED ADMIN — prochlorperazine (COMPAZINE) injection 5 mg: 5 mg | INTRAVENOUS | @ 12:00:00

## 2021-07-29 MED ADMIN — nystatin (MYCOSTATIN) powder 1 application: 1 | TOPICAL | @ 13:00:00

## 2021-07-29 MED ADMIN — insulin lispro (HumaLOG) injection 0-20 Units: 0-20 [IU] | SUBCUTANEOUS

## 2021-07-29 MED ADMIN — insulin lispro (HumaLOG) injection 0-20 Units: 0-20 [IU] | SUBCUTANEOUS | @ 13:00:00

## 2021-07-29 MED ADMIN — insulin lispro (HumaLOG) injection 0-20 Units: 0-20 [IU] | SUBCUTANEOUS | @ 23:00:00

## 2021-07-29 MED ADMIN — insulin lispro (HumaLOG) injection 0-20 Units: 0-20 [IU] | SUBCUTANEOUS | @ 04:00:00

## 2021-07-29 MED ADMIN — finasteride (PROSCAR) tablet 5 mg: 5 mg | ORAL | @ 13:00:00

## 2021-07-29 MED ADMIN — insulin lispro (HumaLOG) injection 0-20 Units: 0-20 [IU] | SUBCUTANEOUS | @ 17:00:00

## 2021-07-29 MED ADMIN — pantoprazole (PROTONIX) EC tablet 40 mg: 40 mg | ORAL | @ 13:00:00

## 2021-07-29 MED ADMIN — lisinopriL (PRINIVIL,ZESTRIL) tablet 20 mg: 20 mg | ORAL | @ 13:00:00

## 2021-07-29 MED ADMIN — cholecalciferol (vitamin D3 25 mcg (1,000 units)) tablet 25 mcg: 25 ug | ORAL | @ 13:00:00

## 2021-07-29 MED ADMIN — levothyroxine (SYNTHROID) tablet 50 mcg: 50 ug | ORAL | @ 12:00:00

## 2021-07-29 NOTE — Unmapped (Signed)
Internal Medicine (MEDW) Progress Note    Assessment & Plan:   Summer Russell is a 45 y.o. 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     Principal Problem:    Lactic acidosis  Active Problems:    Hypertension    Hyperglycemia due to type 2 diabetes mellitus (CMS-HCC)    Proteinuria    CKD (chronic kidney disease)    Hyponatremia    Axillary hidradenitis suppurativa    Psoriasis    Hypothyroidism    Hyperlipidemia  Resolved Problems:    * No resolved hospital problems. *    Weakness - Left leg sensory deficits - Urinary retention - Back pain  PM&R consulted, recommending neuroimaging for further evaluation. Unfortunately, she did not tolerate MRI of spine due to profound discomfort and subjective difficulty breathing on back. Will continue to discuss with patient (eg could try increased pre-sedation), but CT myelogram may also be a a better tolerated option if she declines repeat MRI attempt.  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 Edema over abdomen and extremities has improved with current diuretic regimen/robust UOP, with creatinine relatively stable thus far.  - Nephrology consulted, welcome recs  - Nephrology to place outpatient VIR consult for renal biopsy  - Lisinopril 20 mg daily  - PO Bumex 1 mg bid (low threshold to decrease dose should creatinine rise)    Headache - Vertigo  Compazine appears to be somewhat helpful. No benefit from meclizine trial.   - Brain MRI on 12/24 w/o acute abnormalities   - May benefit from vestibular PT on discharge  - Prn IV compazine (first line), Zofran (second line)  - Prn Tylenol     Hyperglycemia - Uncontrolled T2DM with insulin dependence  BG elevated to >400 on admission (no AG or acidemia to suggest DKA). Continuing to titrate insulin regimen.  - Lantus increased to 50 U qhs  - Continue correctional lispro  - Holding metformin  - Anticipate outpatient referral to [facility] endocrinology (pt's request)  - Jardiance 10 mg daily     Elevated lactate, resolved  Lactate peaked at 5.8, now downtrended. No clear infectious source, with blood cultures NGTD and with skin exam consistent with intertrigo but no clear signs of cellulitis.   - Stopped cefpime, with blood still NGTD   - Holding metformin  - Derm consulted re: possible link to infliximab infusion (unlikely)  ????   Intertrigo  Exam consistent with intertrigo under pannus; no clear cellulitic component.   - BID nystatin powder    Hydradenitis suppurativa - Psoriasis   Managed by dermatology outpatient; on IV Renflexis, finasteride, and clindamycin. She may have been taking Cosentyx, but she is unsure of dose and frequency.  - S/p Renflexis infusion 12/19  - Continue clindamycin, finasteride    Cardiomegaly on CT  TTE technically difficult, but LVEF estimated at >55% with mildly increased wall thickness. Low suspicion for cardiac etiology of edema.  Hypothyroidism - Continue home Synthroid 50 mcg every day + 25 mcg T/Th  HTN - Lisinopril and Bumex as above   HLD - Continue home Lipitor  Sleep Apnea - Does not tolerate CPAP at bedtime  GERD - Continue home PPI  ??  Daily Checklist:  Diet: Carbohydrate Restricted  DVT PPx: Lovenox  Electrolytes: Replete prn  Code Status: Full Code    Team Contact Information:   Primary Team: Internal Medicine (MEDW)  Primary Resident: Starleen Blue, MD  Resident's Pager: [phone] (Gen MedW Intern - Cliffton Asters)    Interval History:   She was able to tolerate MRI brain last night, but she could not tolerate MRI spine that was ordered, despite pre-treatment with PO Ativan.     This morning, she has mild vertigo but no headache. Overall feels quite a bit better in terms of leg and abdominal swelling. Has some improved sensation in her left leg, though still quite weak and with ongoing decreased sensation over distal foot.    She does not think that she would be able to tolerate another MRI, even with pre-treatment with higher dose of benzodiazepine.     All other systems were reviewed and are negative except as noted in the HPI    Objective:   Temp:  [36.1 ??C (97 ??F)-37.1 ??C (98.8 ??F)] 36.1 ??C (97 ??F)  Heart Rate:  [112-116] 116  Resp:  [18-20] 18  BP: (107-151)/(78-85) 151/79  SpO2:  [98 %-100 %] 100 %    Gen: Sitting up in bed, in NAD, answers questions appropriately.  Eyes: Sclera anicteric, EOMI.  HENT: Atraumatic, normocephalic.  Heart: RRR, S1, S2, no M/R/G.  Lungs: CTAB, no crackles or wheezes, no use of accessory muscles  Extremities: 1+ edema in legs, abdomen bilaterally. Improved.  Neuro: Unable to assess gait. Decreased sensation to light touch diffusely over distal foot, able to bend knee and do plantar flexion/extension.  Skin:  Intertrigo under pannus (did not examine today)  Psych: Alert, oriented, normal mood and affect.     Labs/Studies: Labs and Studies from the last 24hrs per EMR and Reviewed

## 2021-07-29 NOTE — Unmapped (Signed)
Pt alert and oriented x4. Vital signs stable. C/o of nausea and pain and received PRN Tylenol and Zofran. PRN Medication noted to be effective. Blood glucose level monitored per schedule. Given insulin per order. Catheter intact and emptied. Family member at bedside. Call bell and bedside table within reach. Bed in lowest position and locked. Will continue POC.     Problem: Adult Inpatient Plan of Care  Goal: Plan of Care Review  Outcome: Ongoing - Unchanged  Goal: Patient-Specific Goal (Individualized)  Outcome: Ongoing - Unchanged  Goal: Absence of Hospital-Acquired Illness or Injury  Outcome: Ongoing - Unchanged  Intervention: Identify and Manage Fall Risk  Recent Flowsheet Documentation  Taken 07/28/2021 1945 by Genevive Bi, RN  Safety Interventions:  ??? bed alarm  ??? environmental modification  ??? fall reduction program maintained  ??? lighting adjusted for tasks/safety  ??? low bed  Goal: Optimal Comfort and Wellbeing  Outcome: Ongoing - Unchanged  Goal: Readiness for Transition of Care  Outcome: Ongoing - Unchanged  Goal: Rounds/Family Conference  Outcome: Ongoing - Unchanged     Problem: Impaired Wound Healing  Goal: Optimal Wound Healing  Outcome: Ongoing - Unchanged     Problem: Self-Care Deficit  Goal: Improved Ability to Complete Activities of Daily Living  Outcome: Ongoing - Unchanged     Problem: Skin Injury Risk Increased  Goal: Skin Health and Integrity  Outcome: Ongoing - Unchanged     Problem: Infection  Goal: Absence of Infection Signs and Symptoms  Outcome: Ongoing - Unchanged

## 2021-07-30 LAB — COMPREHENSIVE METABOLIC PANEL
ALBUMIN: 2 g/dL — ABNORMAL LOW (ref 3.4–5.0)
ALKALINE PHOSPHATASE: 64 U/L (ref 46–116)
ALT (SGPT): 26 U/L (ref 10–49)
ANION GAP: 9 mmol/L (ref 5–14)
AST (SGOT): 38 U/L — ABNORMAL HIGH (ref <=34)
BILIRUBIN TOTAL: 0.3 mg/dL (ref 0.3–1.2)
BLOOD UREA NITROGEN: 17 mg/dL (ref 9–23)
BUN / CREAT RATIO: 17
CALCIUM: 8.5 mg/dL — ABNORMAL LOW (ref 8.7–10.4)
CHLORIDE: 103 mmol/L (ref 98–107)
CO2: 30 mmol/L (ref 20.0–31.0)
CREATININE: 1.01 mg/dL — ABNORMAL HIGH
EGFR CKD-EPI (2021) FEMALE: 71 mL/min/{1.73_m2} (ref >=60)
GLUCOSE RANDOM: 209 mg/dL — ABNORMAL HIGH (ref 70–179)
POTASSIUM: 3.8 mmol/L (ref 3.4–4.8)
PROTEIN TOTAL: 5.5 g/dL — ABNORMAL LOW (ref 5.7–8.2)
SODIUM: 142 mmol/L (ref 135–145)

## 2021-07-30 LAB — CBC
HEMATOCRIT: 33.7 % — ABNORMAL LOW (ref 34.0–44.0)
HEMOGLOBIN: 11.6 g/dL (ref 11.3–14.9)
MEAN CORPUSCULAR HEMOGLOBIN CONC: 34.4 g/dL (ref 32.0–36.0)
MEAN CORPUSCULAR HEMOGLOBIN: 30.7 pg (ref 25.9–32.4)
MEAN CORPUSCULAR VOLUME: 89.4 fL (ref 77.6–95.7)
MEAN PLATELET VOLUME: 8.5 fL (ref 6.8–10.7)
PLATELET COUNT: 221 10*9/L (ref 150–450)
RED BLOOD CELL COUNT: 3.77 10*12/L — ABNORMAL LOW (ref 3.95–5.13)
RED CELL DISTRIBUTION WIDTH: 13.2 % (ref 12.2–15.2)
WBC ADJUSTED: 3.8 10*9/L (ref 3.6–11.2)

## 2021-07-30 LAB — MAGNESIUM: MAGNESIUM: 1.7 mg/dL (ref 1.6–2.6)

## 2021-07-30 LAB — PHOSPHORUS: PHOSPHORUS: 3.9 mg/dL (ref 2.4–5.1)

## 2021-07-30 LAB — TSH: THYROID STIMULATING HORMONE: 3.393 u[IU]/mL (ref 0.550–4.780)

## 2021-07-30 MED ADMIN — clindamycin (CLEOCIN) capsule 300 mg: 300 mg | ORAL | @ 14:00:00 | Stop: 2021-08-08

## 2021-07-30 MED ADMIN — cholecalciferol (vitamin D3 25 mcg (1,000 units)) tablet 25 mcg: 25 ug | ORAL | @ 14:00:00

## 2021-07-30 MED ADMIN — cetirizine (ZyrTEC) tablet 10 mg: 10 mg | ORAL | @ 14:00:00

## 2021-07-30 MED ADMIN — nystatin (MYCOSTATIN) powder 1 application: 1 | TOPICAL | @ 14:00:00

## 2021-07-30 MED ADMIN — enoxaparin (LOVENOX) syringe 40 mg: 40 mg | SUBCUTANEOUS | @ 02:00:00

## 2021-07-30 MED ADMIN — bumetanide (BUMEX) tablet 1 mg: 1 mg | ORAL | @ 11:00:00

## 2021-07-30 MED ADMIN — insulin glargine (LANTUS) injection 50 Units: 50 [IU] | SUBCUTANEOUS | @ 03:00:00

## 2021-07-30 MED ADMIN — lisinopriL (PRINIVIL,ZESTRIL) tablet 20 mg: 20 mg | ORAL | @ 14:00:00

## 2021-07-30 MED ADMIN — insulin lispro (HumaLOG) injection 0-20 Units: 0-20 [IU] | SUBCUTANEOUS | @ 14:00:00

## 2021-07-30 MED ADMIN — finasteride (PROSCAR) tablet 5 mg: 5 mg | ORAL | @ 14:00:00

## 2021-07-30 MED ADMIN — acetaminophen (TYLENOL) tablet 1,000 mg: 1000 mg | ORAL | @ 16:00:00

## 2021-07-30 MED ADMIN — levothyroxine (SYNTHROID) tablet 50 mcg: 50 ug | ORAL | @ 11:00:00

## 2021-07-30 MED ADMIN — prochlorperazine (COMPAZINE) injection 5 mg: 5 mg | INTRAVENOUS | @ 18:00:00

## 2021-07-30 MED ADMIN — insulin lispro (HumaLOG) injection 0-20 Units: 0-20 [IU] | SUBCUTANEOUS | @ 03:00:00

## 2021-07-30 MED ADMIN — aspirin chewable tablet 81 mg: 81 mg | ORAL | @ 14:00:00

## 2021-07-30 MED ADMIN — bumetanide (BUMEX) tablet 1 mg: 1 mg | ORAL | @ 20:00:00

## 2021-07-30 MED ADMIN — atorvastatin (LIPITOR) tablet 20 mg: 20 mg | ORAL | @ 14:00:00

## 2021-07-30 MED ADMIN — clindamycin (CLEOCIN) capsule 300 mg: 300 mg | ORAL | @ 02:00:00 | Stop: 2021-08-08

## 2021-07-30 MED ADMIN — enoxaparin (LOVENOX) syringe 40 mg: 40 mg | SUBCUTANEOUS | @ 14:00:00

## 2021-07-30 MED ADMIN — empagliflozin (JARDIANCE) tablet 10 mg: 10 mg | ORAL | @ 16:00:00

## 2021-07-30 MED ADMIN — insulin lispro (HumaLOG) injection 0-20 Units: 0-20 [IU] | SUBCUTANEOUS

## 2021-07-30 MED ADMIN — prochlorperazine (COMPAZINE) injection 5 mg: 5 mg | INTRAVENOUS | @ 05:00:00

## 2021-07-30 MED ADMIN — insulin lispro (HumaLOG) injection 0-20 Units: 0-20 [IU] | SUBCUTANEOUS | @ 18:00:00

## 2021-07-30 MED ADMIN — nystatin (MYCOSTATIN) powder 1 application: 1 | TOPICAL | @ 02:00:00

## 2021-07-30 MED ADMIN — pantoprazole (PROTONIX) EC tablet 40 mg: 40 mg | ORAL | @ 14:00:00

## 2021-07-30 NOTE — Unmapped (Signed)
Pt alert and oriented, complain one time of nausea, relieved with compazine. Pt als complains of not getting her birth control pills. Resident was paged concerning pt's medication. No acute distress noted, afebrile, foley in place, right chest port clean and intact.    Problem: Adult Inpatient Plan of Care  Goal: Plan of Care Review  Outcome: Progressing  Goal: Patient-Specific Goal (Individualized)  Outcome: Progressing  Goal: Absence of Hospital-Acquired Illness or Injury  Outcome: Progressing  Intervention: Prevent and Manage VTE (Venous Thromboembolism) Risk  Recent Flowsheet Documentation  Taken 07/29/2021 2029 by Shea Evans, RN  Activity Management: activity encouraged  Goal: Optimal Comfort and Wellbeing  Outcome: Progressing  Goal: Readiness for Transition of Care  Outcome: Progressing  Goal: Rounds/Family Conference  Outcome: Progressing     Problem: Impaired Wound Healing  Goal: Optimal Wound Healing  Outcome: Progressing  Intervention: Promote Wound Healing  Recent Flowsheet Documentation  Taken 07/29/2021 2029 by Shea Evans, RN  Activity Management: activity encouraged     Problem: Self-Care Deficit  Goal: Improved Ability to Complete Activities of Daily Living  Outcome: Progressing     Problem: Skin Injury Risk Increased  Goal: Skin Health and Integrity  Outcome: Progressing     Problem: Infection  Goal: Absence of Infection Signs and Symptoms  Outcome: Progressing     Problem: Pain Acute  Goal: Acceptable Pain Control and Functional Ability  Outcome: Progressing     Problem: Hypertension Acute  Goal: Blood Pressure Within Desired Range  Outcome: Progressing

## 2021-07-30 NOTE — Unmapped (Signed)
Patient alert and oriented x 4 this shift.  Patient complained of nausea 1 this shift.  Relived with compazine.  Spouse at the bedside.   BG managed with insulin. MRI screening form completed.   No other voiced this shift,    Problem: Adult Inpatient Plan of Care  Goal: Plan of Care Review  Outcome: Ongoing - Unchanged  Goal: Patient-Specific Goal (Individualized)  Outcome: Ongoing - Unchanged  Goal: Absence of Hospital-Acquired Illness or Injury  Outcome: Ongoing - Unchanged  Intervention: Identify and Manage Fall Risk  Recent Flowsheet Documentation  Taken 07/29/2021 0850 by Darrold Junker, RN  Safety Interventions: environmental modification  Goal: Optimal Comfort and Wellbeing  Outcome: Ongoing - Unchanged  Goal: Readiness for Transition of Care  Outcome: Ongoing - Unchanged  Goal: Rounds/Family Conference  Outcome: Ongoing - Unchanged

## 2021-07-30 NOTE — Unmapped (Signed)
Internal Medicine (MEDW) Progress Note    Assessment & Plan:   Summer Russell is a 45 y.o. 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     Principal Problem:    Lactic acidosis  Active Problems:    Hypertension    Hyperglycemia due to type 2 diabetes mellitus (CMS-HCC)    Proteinuria    CKD (chronic kidney disease)    Hyponatremia    Axillary hidradenitis suppurativa    Psoriasis    Hypothyroidism    Hyperlipidemia  Resolved Problems:    * No resolved hospital problems. *    Weakness - Left leg sensory deficits - Urinary retention - Back pain  PM&R consulted, recommending neuroimaging for further evaluation. Unfortunately, she did not tolerate MRI of spine due to profound discomfort and subjective difficulty breathing on back. Discussed CT myelogram as option with radiology, but this would require her to lie on her stomach and undergo LP, frequent repositioning.   She does remain profoundly weak and unable to walk readily without assistance. Given that her sensory deficits have improved since admission, however, will reach out to PM&R to ask for re-eval and determination of need for neuroimaging for AIR qualification.   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 Edema over abdomen and extremities has improved with current diuretic regimen, with creatinine downtrended today.  - Nephrology consulted, welcome recs  - Nephrology placed outpatient VIR consult for renal biopsy  - Lisinopril 20 mg daily  - PO Bumex 1 mg bid (low threshold to decrease dose should creatinine rise)    Headache - Vertigo  Compazine appears to be somewhat helpful. No benefit from meclizine trial.   - Brain MRI on 12/24 w/o acute abnormalities   - May benefit from vestibular PT on discharge  - Prn IV compazine (first line), Zofran (second line)  - Prn Tylenol     Contraception  Her home OCP (ethinyl estradiol/drospirenone) was held on admission. She does poorly with her period (often with HS flares associated). Discussed with pharmacy, and will continue to hold OCPs for now, given hypercoagulability associated with nephrotic syndrome.     Hyperglycemia - Uncontrolled T2DM with insulin dependence  BG elevated to >400 on admission (no AG or acidemia to suggest DKA). Continuing to titrate insulin regimen.  - Lantus 50 U qhs  - Continue correctional lispro  - Holding metformin  - Anticipate outpatient referral to [facility] endocrinology (pt's request)  - Jardiance 10 mg daily     Contraception  She is on Ehrenberg     Elevated lactate, resolved  Lactate peaked at 5.8, then downtrended. No clear infectious source, with blood cultures NGTD and with skin exam consistent with intertrigo but no clear signs of cellulitis.   - Stopped cefpime, with blood still NGTD   - Holding metformin  - Derm consulted re: possible link to infliximab infusion (unlikely)  ????   Intertrigo  Exam consistent with intertrigo under pannus; no clear cellulitic component.   - BID nystatin powder    Hydradenitis  suppurativa - Psoriasis   Managed by dermatology outpatient; on IV Renflexis, finasteride, and clindamycin.   - Anticipate OP dermatology follow up on discharge  - S/p Renflexis infusion 12/19  - Continue clindamycin, finasteride    Cardiomegaly on CT  TTE technically difficult, but LVEF estimated at >55% with mildly increased wall thickness. Low suspicion for cardiac etiology of edema.    Hypothyroidism - Continue home Synthroid 50 mcg every day + 25 mcg T/Th  HTN - Lisinopril and Bumex as above   HLD - Continue home Lipitor  Sleep Apnea - Does not tolerate CPAP at bedtime  GERD - Continue home PPI  ??  Daily Checklist:  Diet: Carbohydrate Restricted  DVT PPx: Lovenox  Electrolytes: Replete prn  Code Status: Full Code    Team Contact Information:   Primary Team: Internal Medicine (MEDW)  Primary Resident: Starleen Blue, MD  Resident's Pager: [phone] (Gen MedW Intern - Cliffton Asters)    Interval History:   No acute ON events.    She did start her period this morning, which is quite hard for her as it is often associated with HS flares. Would like to be back on OCPs, but also understands possible clotting risks.    Otherwise, continues to have marked weakness, but her sensory deficits feel closer to her baseline neuropathy at this point.     All other systems were reviewed and are negative except as noted in the HPI    Objective:   Temp:  [36.1 ??C (97 ??F)-36.6 ??C (97.9 ??F)] 36.6 ??C (97.9 ??F)  Heart Rate:  [111-116] 111  Resp:  [18] 18  BP: (146-152)/(79-90) 146/90  SpO2:  [98 %-100 %] 98 %    Gen: Sitting up in bed, in NAD, answers questions appropriately.  Eyes: Sclera anicteric, EOMI.  HENT: Atraumatic, normocephalic.  Heart: RRR, S1, S2, no M/R/G.  Lungs: CTAB, no crackles or wheezes, no use of accessory muscles  Extremities: 1+ edema in legs, abdomen bilaterally. Much improved.  Neuro: Unable to assess gait. Decreased sensation to light touch diffusely over distal foot, able to bend knee and do plantar flexion/extension.  Skin:  Intertrigo under pannus (did not examine today)  Psych: Alert, oriented, normal mood and affect.     Labs/Studies: Labs and Studies from the last 24hrs per EMR and Reviewed

## 2021-07-31 MED ADMIN — cholecalciferol (vitamin D3 25 mcg (1,000 units)) tablet 25 mcg: 25 ug | ORAL | @ 13:00:00

## 2021-07-31 MED ADMIN — finasteride (PROSCAR) tablet 5 mg: 5 mg | ORAL | @ 13:00:00

## 2021-07-31 MED ADMIN — prochlorperazine (COMPAZINE) injection 5 mg: 5 mg | INTRAVENOUS | @ 18:00:00

## 2021-07-31 MED ADMIN — lisinopriL (PRINIVIL,ZESTRIL) tablet 20 mg: 20 mg | ORAL | @ 13:00:00 | Stop: 2021-07-31

## 2021-07-31 MED ADMIN — cetirizine (ZyrTEC) tablet 10 mg: 10 mg | ORAL | @ 13:00:00

## 2021-07-31 MED ADMIN — fenofibrate (TRICOR) tablet 48 mg: 48 mg | ORAL | @ 21:00:00

## 2021-07-31 MED ADMIN — bumetanide (BUMEX) tablet 1 mg: 1 mg | ORAL | @ 11:00:00 | Stop: 2021-07-31

## 2021-07-31 MED ADMIN — acetaminophen (TYLENOL) tablet 1,000 mg: 1000 mg | ORAL | @ 16:00:00

## 2021-07-31 MED ADMIN — insulin lispro (HumaLOG) injection 0-20 Units: 0-20 [IU] | SUBCUTANEOUS | @ 21:00:00

## 2021-07-31 MED ADMIN — nystatin (MYCOSTATIN) powder 1 application: 1 | TOPICAL | @ 13:00:00

## 2021-07-31 MED ADMIN — empagliflozin (JARDIANCE) tablet 10 mg: 10 mg | ORAL | @ 13:00:00

## 2021-07-31 MED ADMIN — clindamycin (CLEOCIN) capsule 300 mg: 300 mg | ORAL | @ 03:00:00 | Stop: 2021-08-08

## 2021-07-31 MED ADMIN — levothyroxine (SYNTHROID) tablet 50 mcg: 50 ug | ORAL | @ 11:00:00

## 2021-07-31 MED ADMIN — aspirin chewable tablet 81 mg: 81 mg | ORAL | @ 13:00:00

## 2021-07-31 MED ADMIN — insulin lispro (HumaLOG) injection 0-20 Units: 0-20 [IU] | SUBCUTANEOUS | @ 03:00:00

## 2021-07-31 MED ADMIN — acetaminophen (TYLENOL) tablet 1,000 mg: 1000 mg | ORAL | @ 07:00:00

## 2021-07-31 MED ADMIN — enoxaparin (LOVENOX) syringe 40 mg: 40 mg | SUBCUTANEOUS | @ 03:00:00

## 2021-07-31 MED ADMIN — insulin lispro (HumaLOG) injection 0-20 Units: 0-20 [IU] | SUBCUTANEOUS | @ 13:00:00

## 2021-07-31 MED ADMIN — atorvastatin (LIPITOR) tablet 20 mg: 20 mg | ORAL | @ 13:00:00

## 2021-07-31 MED ADMIN — insulin glargine (LANTUS) injection 50 Units: 50 [IU] | SUBCUTANEOUS | @ 03:00:00

## 2021-07-31 MED ADMIN — enoxaparin (LOVENOX) syringe 40 mg: 40 mg | SUBCUTANEOUS | @ 13:00:00

## 2021-07-31 MED ADMIN — pantoprazole (PROTONIX) EC tablet 40 mg: 40 mg | ORAL | @ 13:00:00

## 2021-07-31 MED ADMIN — clindamycin (CLEOCIN) capsule 300 mg: 300 mg | ORAL | @ 13:00:00 | Stop: 2021-08-08

## 2021-07-31 MED ADMIN — diclofenac sodium (VOLTAREN) 1 % gel 2 g: 2 g | TOPICAL | @ 18:00:00

## 2021-07-31 MED ADMIN — insulin lispro (HumaLOG) injection 0-20 Units: 0-20 [IU] | SUBCUTANEOUS | @ 18:00:00

## 2021-07-31 NOTE — Unmapped (Signed)
Patient is alert and oriented. VSS. She is afebrile. Foley for urinay  obstruction. Provided foley care. Monitor blood sugar it above 200s today. managed with SS insulin. She c/o abdomen pain once and got tylenol 1000 mg that helped. No falls. OT worked with her today. Lovenox subcutaneous for VTE. Will continue with plan of care.   Problem: Adult Inpatient Plan of Care  Goal: Plan of Care Review  Outcome: Ongoing - Unchanged  Flowsheets (Taken 07/30/2021 1906)  Progress: no change  Plan of Care Reviewed With: patient  Goal: Patient-Specific Goal (Individualized)  Outcome: Ongoing - Unchanged  Flowsheets (Taken 07/30/2021 1906)  Patient-Specific Goals (Include Timeframe): Patient will remain with out infection by discharge.  Individualized Care Needs: Falls, foley care, diuretics, labs, blood sugar.  Anxieties, Fears or Concerns: Foley tubing pressure on the abdomen  Goal: Absence of Hospital-Acquired Illness or Injury  Outcome: Ongoing - Unchanged  Intervention: Identify and Manage Fall Risk  Flowsheets  Taken 07/30/2021 1906  Safety Interventions: fall reduction program maintained  Taken 07/30/2021 0800  Safety Interventions:  ??? environmental modification  ??? fall reduction program maintained  ??? family at bedside  Intervention: Prevent and Manage VTE (Venous Thromboembolism) Risk  Flowsheets (Taken 07/30/2021 1906)  Activity Management: activity adjusted per tolerance  VTE Prevention/Management:  ??? ambulation promoted  ??? anticoagulant therapy  Intervention: Prevent Infection  Flowsheets (Taken 07/30/2021 1906)  Infection Prevention:  ??? hand hygiene promoted  ??? rest/sleep promoted  Goal: Optimal Comfort and Wellbeing  Outcome: Ongoing - Unchanged  Intervention: Monitor Pain and Promote Comfort  Flowsheets (Taken 07/30/2021 1906)  Pain Management Interventions: pain management plan reviewed with patient/caregiver  Intervention: Provide Person-Centered Care  Flowsheets (Taken 07/30/2021 1906)  Trust Relationship/Rapport:  ??? care explained  ??? questions answered  ??? reassurance provided  Goal: Readiness for Transition of Care  Outcome: Ongoing - Unchanged  Intervention: Mutually Develop Transition Plan  Flowsheets (Taken 07/30/2021 1906)  Concerns to be Addressed: care coordination/care conferences  Goal: Rounds/Family Conference  Outcome: Ongoing - Unchanged  Flowsheets (Taken 07/30/2021 1906)  Participants:  ??? case manager  ??? nursing  ??? physician     Problem: Impaired Wound Healing  Goal: Optimal Wound Healing  Outcome: Ongoing - Unchanged  Intervention: Promote Wound Healing  Flowsheets (Taken 07/30/2021 1906)  Activity Management: activity adjusted per tolerance  Pain Management Interventions: pain management plan reviewed with patient/caregiver     Problem: Self-Care Deficit  Goal: Improved Ability to Complete Activities of Daily Living  Outcome: Ongoing - Unchanged     Problem: Skin Injury Risk Increased  Goal: Skin Health and Integrity  Outcome: Ongoing - Unchanged  Intervention: Optimize Skin Protection  Flowsheets  Taken 07/30/2021 1906  Head of Bed (HOB) Positioning: HOB at 20-30 degrees  Taken 07/30/2021 0800  Head of Bed (HOB) Positioning: HOB at 20-30 degrees     Problem: Infection  Goal: Absence of Infection Signs and Symptoms  Outcome: Ongoing - Unchanged     Problem: Pain Acute  Goal: Acceptable Pain Control and Functional Ability  Outcome: Ongoing - Unchanged  Intervention: Develop Pain Management Plan  Recent Flowsheet Documentation  Taken 07/30/2021 1906 by Cicero Duck, RN  Pain Management Interventions: pain management plan reviewed with patient/caregiver     Problem: Hypertension Acute  Goal: Blood Pressure Within Desired Range  Outcome: Ongoing - Unchanged

## 2021-07-31 NOTE — Unmapped (Incomplete)
Nephrology Consult Follow-up Note    Requesting provider: Dr. Mayford Knife  Service requesting consult: MedW  Reason for consult: Nephrotic Range Proteinuria      Assessment/Recommendations: Summer Russell is a 45 y.o. female with a past medical history of uncontrolled T2DM, hypothryoidism, psoriasis admitted after feeling weak from her long-standing infliximab infusion. She was found to have an elevated lactate and nephrotic range proteinuria.    #Nephrotic Range Proteinuria: Etiology is broad at this time. High on the differential would include diabetic nephropathy along with secondary FSGS from obesity or minimal change disease from NSAID use. Also on the differential includes membranous nephropathy and other secondary causes such as amyloid. UPC 14.4 and UAC 7,643 on 12/20. Her poorly controlled diabetes and her UAC of 243 in 2014 in care everywhere (no further urine studies done) make at least some element of diabetic nephropathy very likely. She did see a Nephrology about a decade ago for protein in her urine she says, but she did not follow up again. Other nephrotic symptoms include frothy urine, edema, hypoalbuminemia (2.5), hyperlipidemia. Urine sediment reviewed with fatty casts and granular debris. CT Ab/P on 12/20 without hydro with both kidneys.  - Given significant proteinuria, would recommend a kidney biopsy by VIR. Have placed order/referral for VIR biopsy as an outpatient.  - Low salt diet to avoid further swelling  - Avoiding NSAIDs  - Increase lisinopril to 40 mg daily.  - Please use large blood pressure cuff to measure blood pressures.  - Continue empagliflozin.  - Hepatitis serologies, HIV negative, dsDNA. Will follow up ANA and PLA2R Ab.  - Continue Bumex 1mg  PO BID  - Continue to monitor Cr daily, check strict I/Os while diuresing  - Has outpatient Nephrology appt on 1/6 with Dr. Renold Don.      [name], MD  [facility] Nephrology Fellow, PGY-5  Department of Nephrology and Hypertension      _____________________________________________________________________________________      Interval History: No acute complaints. 5225 ml UOP yesterday. Not all PO intake being charted.    Medications:   Current Facility-Administered Medications   Medication Dose Route Frequency Provider Last Rate Last Admin   ??? acetaminophen (TYLENOL) tablet 1,000 mg  1,000 mg Oral Q8H PRN Benjaman Kindler, MD   1,000 mg at 07/31/21 4540   ??? aspirin chewable tablet 81 mg  81 mg Oral Daily Benjaman Kindler, MD   81 mg at 07/31/21 9811   ??? atorvastatin (LIPITOR) tablet 20 mg  20 mg Oral Daily Benjaman Kindler, MD   20 mg at 07/31/21 9147   ??? bumetanide (BUMEX) tablet 1 mg  1 mg Oral BID Starleen Blue, MD   1 mg at 07/31/21 0540   ??? cetirizine (ZyrTEC) tablet 10 mg  10 mg Oral Daily Benjaman Kindler, MD   10 mg at 07/31/21 8295   ??? cholecalciferol (vitamin D3 25 mcg (1,000 units)) tablet 25 mcg  25 mcg Oral Daily Benjaman Kindler, MD   25 mcg at 07/31/21 6213   ??? clindamycin (CLEOCIN) capsule 300 mg  300 mg Oral BID Benjaman Kindler, MD   300 mg at 07/31/21 0865   ??? dextrose 50 % in water (D50W) 50 % solution 12.5 g  12.5 g Intravenous Q10 Min PRN Benjaman Kindler, MD       ??? diclofenac sodium (VOLTAREN) 1 % gel 2 g  2 g Topical QID Benjaman Kindler, MD   2 g at 07/28/21 1221   ???  senna (SENOKOT) tablet 1 tablet  1 tablet Oral BID PRN Benjaman Kindler, MD        And   ??? docusate sodium (COLACE) capsule 100 mg  100 mg Oral BID PRN Benjaman Kindler, MD       ??? empagliflozin (JARDIANCE) tablet 10 mg  10 mg Oral Daily Starleen Blue, MD   10 mg at 07/31/21 1610   ??? enoxaparin (LOVENOX) syringe 40 mg  40 mg Subcutaneous Q12H Benjaman Kindler, MD   40 mg at 07/31/21 9604   ??? finasteride (PROSCAR) tablet 5 mg  5 mg Oral Daily Benjaman Kindler, MD   5 mg at 07/31/21 5409   ??? glucagon injection 1 mg  1 mg Intramuscular Once PRN Benjaman Kindler, MD       ??? glucose chewable tablet 16 g  16 g Oral Q10 Min PRN Benjaman Kindler, MD       ??? insulin glargine (LANTUS) injection 60 Units  60 Units Subcutaneous Nightly Derald Macleod, MD       ??? insulin lispro (HumaLOG) injection 0-20 Units  0-20 Units Subcutaneous ACHS Benjaman Kindler, MD   3 Units at 07/31/21 8119   ??? levothyroxine (SYNTHROID) tablet 25 mcg  25 mcg Oral Once per day on Tue Thu Emma C Thompson, MD   25 mcg at 07/27/21 1478   ??? levothyroxine (SYNTHROID) tablet 50 mcg  50 mcg Oral Daily Benjaman Kindler, MD   50 mcg at 07/31/21 0540   ??? lisinopriL (PRINIVIL,ZESTRIL) tablet 20 mg  20 mg Oral Daily Derald Macleod, MD   20 mg at 07/31/21 2956   ??? LORazepam (ATIVAN) injection 2 mg  2 mg Intravenous Daily PRN Derald Macleod, MD       ??? meclizine (ANTIVERT) tablet 25 mg  25 mg Oral TID PRN Starleen Blue, MD   25 mg at 07/28/21 0601   ??? nystatin (MYCOSTATIN) powder 1 application  1 application Topical BID Starleen Blue, MD   1 application at 07/31/21 0826   ??? ondansetron [facility]) injection 8 mg  8 mg Intravenous Q6H PRN Starleen Blue, MD   8 mg at 07/28/21 2308   ??? pantoprazole (PROTONIX) EC tablet 40 mg  40 mg Oral Daily Benjaman Kindler, MD   40 mg at 07/31/21 2130   ??? pneumoc 20-val conj-dip cr(PF) (PREVNAR-20) vaccine 0.5 mL  0.5 mL Intramuscular During hospitalization De-Vaughn Sima Matas, MD       ??? polyethylene glycol (MIRALAX) packet 17 g  17 g Oral Daily PRN Benjaman Kindler, MD       ??? prochlorperazine (COMPAZINE) injection 5 mg  5 mg Intravenous Q6H PRN Starleen Blue, MD   5 mg at 07/30/21 1308        ALLERGIES  Venom-honey bee, Lamictal [lamotrigine], and Oxycodone    MEDICAL HISTORY  Past Medical History:   Diagnosis Date   ??? CKD (chronic kidney disease) 07/25/2021   ??? Diabetes mellitus (CMS-HCC)    ??? Disease of thyroid gland    ??? Fibromyalgia    ??? Hydradenitis    ??? Psoriasis    ??? Psoriatic arthritis (CMS-HCC)    ??? Sleep apnea         SOCIAL HISTORY  Social History     Socioeconomic History   ??? Marital status: Divorced   Tobacco Use   ??? Smoking status: Former     Packs/day: 2.00  Years: 20.00     Pack years: 40.00     Types: Cigarettes     Quit date: 2013     Years since quitting: 9.9   ??? Smokeless tobacco: Never   Substance and Sexual Activity   ??? Alcohol use: No   ??? Drug use: No   Other Topics Concern   ??? Do you use sunscreen? Yes   ??? Tanning bed use? No   ??? Are you easily burned? No   ??? Excessive sun exposure? No   ??? Blistering sunburns? Yes     Social Determinants of Health     Financial Resource Strain: Low Risk    ??? Difficulty of Paying Living Expenses: Not hard at all   Food Insecurity: No Food Insecurity   ??? Worried About Programme researcher, broadcasting/film/video in the Last Year: Never true   ??? Ran Out of Food in the Last Year: Never true   Transportation Needs: No Transportation Needs   ??? Lack of Transportation (Medical): No   ??? Lack of Transportation (Non-Medical): No        FAMILY HISTORY  Family History   Adopted: Yes   Problem Relation Age of Onset   ??? Diabetes Mother    ??? Hypertension Mother    ??? Diabetes Father    ??? Hypertension Father    ??? Melanoma Neg Hx    ??? Basal cell carcinoma Neg Hx    ??? Squamous cell carcinoma Neg Hx      No family history of kidney disease      Physical Exam:  Vitals:    07/31/21 0800   BP: 152/81   Pulse: 113   Resp: 19   Temp: 36.7 ??C (98.1 ??F)   SpO2: 99%     No intake/output data recorded.    Intake/Output Summary (Last 24 hours) at 07/31/2021 0835  Last data filed at 07/31/2021 0000  Gross per 24 hour   Intake 300 ml   Output 5225 ml   Net -4925 ml     General: alert, in no acute distress  HEENT: anicteric sclera  CV: regular rate, normal rhythm, no murmurs, no gallops, no rubs, 2+ peripheral edema  Lungs: difficult to assess but no clear wheezing and normal work of breathing   Abd: soft, non-tender, abdominal wall edema  Skin: no visible lesions or rashes  Psych: alert, engaged, appropriate mood and affect  Musculoskeletal: no obvious deformities      Test Results  Reviewed  Lab Results   Component Value Date    NA 142 07/30/2021    K 3.8 07/30/2021    CL 103 07/30/2021 CO2 30.0 07/30/2021    BUN 17 07/30/2021    CREATININE 1.01 (H) 07/30/2021    GFR >= 60 06/02/2012    GLU 209 (H) 07/30/2021    CALCIUM 8.5 (L) 07/30/2021    ALBUMIN 2.0 (L) 07/30/2021    PHOS 3.9 07/30/2021         I have reviewed all relevant outside healthcare records related to the patient's presentation.

## 2021-07-31 NOTE — Unmapped (Signed)
Problem: Adult Inpatient Plan of Care  Goal: Absence of Hospital-Acquired Illness or Injury  Outcome: Progressing  Intervention: Identify and Manage Fall Risk  Recent Flowsheet Documentation  Taken 07/30/2021 2045 by Corinna Capra, RN  Safety Interventions:   fall reduction program maintained   family at bedside  Intervention: Prevent and Manage VTE (Venous Thromboembolism) Risk  Recent Flowsheet Documentation  Taken 07/30/2021 2045 by Corinna Capra, RN  VTE Prevention/Management: anticoagulant therapy  Goal: Optimal Comfort and Wellbeing  Outcome: Progressing     Problem: Self-Care Deficit  Goal: Improved Ability to Complete Activities of Daily Living  Outcome: Progressing     Problem: Skin Injury Risk Increased  Goal: Skin Health and Integrity  Intervention: Optimize Skin Protection  Recent Flowsheet Documentation  Taken 07/30/2021 2045 by Corinna Capra, RN  Pressure Reduction Techniques: frequent weight shift encouraged     Problem: Infection  Goal: Absence of Infection Signs and Symptoms  Outcome: Progressing

## 2021-08-01 LAB — CBC
HEMATOCRIT: 36 % (ref 34.0–44.0)
HEMOGLOBIN: 12.1 g/dL (ref 11.3–14.9)
MEAN CORPUSCULAR HEMOGLOBIN CONC: 33.7 g/dL (ref 32.0–36.0)
MEAN CORPUSCULAR HEMOGLOBIN: 30.2 pg (ref 25.9–32.4)
MEAN CORPUSCULAR VOLUME: 89.5 fL (ref 77.6–95.7)
MEAN PLATELET VOLUME: 8.8 fL (ref 6.8–10.7)
PLATELET COUNT: 265 10*9/L (ref 150–450)
RED BLOOD CELL COUNT: 4.02 10*12/L (ref 3.95–5.13)
RED CELL DISTRIBUTION WIDTH: 13.4 % (ref 12.2–15.2)
WBC ADJUSTED: 4.1 10*9/L (ref 3.6–11.2)

## 2021-08-01 LAB — BASIC METABOLIC PANEL
ANION GAP: 12 mmol/L (ref 5–14)
BLOOD UREA NITROGEN: 16 mg/dL (ref 9–23)
BUN / CREAT RATIO: 18
CALCIUM: 8.6 mg/dL — ABNORMAL LOW (ref 8.7–10.4)
CHLORIDE: 101 mmol/L (ref 98–107)
CO2: 28 mmol/L (ref 20.0–31.0)
CREATININE: 0.91 mg/dL — ABNORMAL HIGH
EGFR CKD-EPI (2021) FEMALE: 80 mL/min/{1.73_m2} (ref >=60)
GLUCOSE RANDOM: 231 mg/dL — ABNORMAL HIGH (ref 70–179)
POTASSIUM: 3.6 mmol/L (ref 3.4–4.8)
SODIUM: 141 mmol/L (ref 135–145)

## 2021-08-01 LAB — MAGNESIUM: MAGNESIUM: 1.8 mg/dL (ref 1.6–2.6)

## 2021-08-01 LAB — ANA
ANA TITER 1: >=1:640 {titer}
ANTINUCLEAR ANTIBODIES (ANA): POSITIVE — AB

## 2021-08-01 LAB — URINE PROTEIN ELECTROPHORESIS (UPE)

## 2021-08-01 MED ADMIN — docusate sodium (COLACE) capsule 100 mg: 100 mg | ORAL | @ 02:00:00

## 2021-08-01 MED ADMIN — insulin lispro (HumaLOG) injection 0-20 Units: 0-20 [IU] | SUBCUTANEOUS | @ 17:00:00

## 2021-08-01 MED ADMIN — insulin lispro (HumaLOG) injection 0-20 Units: 0-20 [IU] | SUBCUTANEOUS | @ 23:00:00

## 2021-08-01 MED ADMIN — nystatin (MYCOSTATIN) powder 1 application: 1 | TOPICAL | @ 14:00:00

## 2021-08-01 MED ADMIN — levothyroxine (SYNTHROID) tablet 25 mcg: 25 ug | ORAL | @ 12:00:00

## 2021-08-01 MED ADMIN — finasteride (PROSCAR) tablet 5 mg: 5 mg | ORAL | @ 14:00:00

## 2021-08-01 MED ADMIN — levothyroxine (SYNTHROID) tablet 50 mcg: 50 ug | ORAL | @ 12:00:00

## 2021-08-01 MED ADMIN — aspirin chewable tablet 81 mg: 81 mg | ORAL | @ 14:00:00

## 2021-08-01 MED ADMIN — lisinopriL (PRINIVIL,ZESTRIL) tablet 40 mg: 40 mg | ORAL | @ 14:00:00

## 2021-08-01 MED ADMIN — enoxaparin (LOVENOX) syringe 40 mg: 40 mg | SUBCUTANEOUS | @ 14:00:00

## 2021-08-01 MED ADMIN — insulin lispro (HumaLOG) injection 5 Units: 5 [IU] | SUBCUTANEOUS | @ 17:00:00

## 2021-08-01 MED ADMIN — fenofibrate (TRICOR) tablet 48 mg: 48 mg | ORAL | @ 14:00:00

## 2021-08-01 MED ADMIN — empagliflozin (JARDIANCE) tablet 10 mg: 10 mg | ORAL | @ 14:00:00

## 2021-08-01 MED ADMIN — acetaminophen (TYLENOL) tablet 1,000 mg: 1000 mg | ORAL | @ 23:00:00

## 2021-08-01 MED ADMIN — docusate sodium (COLACE) capsule 100 mg: 100 mg | ORAL | @ 14:00:00

## 2021-08-01 MED ADMIN — atorvastatin (LIPITOR) tablet 20 mg: 20 mg | ORAL | @ 14:00:00

## 2021-08-01 MED ADMIN — clindamycin (CLEOCIN) capsule 300 mg: 300 mg | ORAL | @ 14:00:00 | Stop: 2021-08-08

## 2021-08-01 MED ADMIN — insulin glargine (LANTUS) injection 60 Units: 60 [IU] | SUBCUTANEOUS | @ 06:00:00

## 2021-08-01 MED ADMIN — cetirizine (ZyrTEC) tablet 10 mg: 10 mg | ORAL | @ 14:00:00

## 2021-08-01 MED ADMIN — cholecalciferol (vitamin D3 25 mcg (1,000 units)) tablet 25 mcg: 25 ug | ORAL | @ 14:00:00

## 2021-08-01 MED ADMIN — acetaminophen (TYLENOL) tablet 1,000 mg: 1000 mg | ORAL | @ 02:00:00

## 2021-08-01 MED ADMIN — pantoprazole (PROTONIX) EC tablet 40 mg: 40 mg | ORAL | @ 14:00:00

## 2021-08-01 MED ADMIN — senna (SENOKOT) tablet 1 tablet: 1 | ORAL | @ 14:00:00

## 2021-08-01 MED ADMIN — senna (SENOKOT) tablet 1 tablet: 1 | ORAL | @ 02:00:00

## 2021-08-01 MED ADMIN — insulin lispro (HumaLOG) injection 0-20 Units: 0-20 [IU] | SUBCUTANEOUS | @ 06:00:00

## 2021-08-01 MED ADMIN — clindamycin (CLEOCIN) capsule 300 mg: 300 mg | ORAL | @ 02:00:00 | Stop: 2021-08-08

## 2021-08-01 MED ADMIN — enoxaparin (LOVENOX) syringe 40 mg: 40 mg | SUBCUTANEOUS | @ 02:00:00

## 2021-08-01 MED ADMIN — insulin lispro (HumaLOG) injection 0-20 Units: 0-20 [IU] | SUBCUTANEOUS | @ 12:00:00

## 2021-08-01 NOTE — Unmapped (Signed)
Nephrology Treatment Plan    Summer Russell is a 45 y.o. female with a past medical history of uncontrolled T2DM, hypothryoidism, psoriasis admitted after feeling weak from her long-standing infliximab infusion. She was found to have an elevated lactate and nephrotic range proteinuria. Her renal function has remained stable. She has been diuresing well. At this time, nephrology will sign off.     Final Recommendations:   - Outpatient Nephrology follow up with [facility] scheduled for 1/6 at 11:15 with Dr. Renold Don  - Can restart bumex 1mg  BID if urine output decreases or swelling increases  - Continue lisinopril and SGLT-2 inhibitor as tolerated for proteinuria  - She will need and outpatient kidney biopsy with VIR  - We will follow up pending nephrotic work up labs        Thank you for the consult. Nephrology with sign off. Case discussed with Dr. Reuben Likes. Please feel free to reach out with any questions or concerns.    Marian Sorrow, MD  [facility] Nephrology Fellow, PGY-4  Department of Nephrology and Hypertension

## 2021-08-01 NOTE — Unmapped (Signed)
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   ??  Principal Problem:    Lactic acidosis  Active Problems:    Hypertension    Hyperglycemia due to type 2 diabetes mellitus (CMS-HCC)    Proteinuria    CKD (chronic kidney disease)    Hyponatremia    Axillary hidradenitis suppurativa    Psoriasis    Hypothyroidism    Hyperlipidemia  Resolved Problems:    * No resolved hospital problems. *  ??  Weakness - Left leg sensory deficits - Urinary retention - Back pain  PM&R consulted, recommending neuroimaging for further evaluation. Unfortunately, she did not tolerate MRI of spine due to profound discomfort and subjective difficulty breathing on back. Discussed CT myelogram as option with radiology, but this would require her to lie on her stomach and undergo LP, frequent repositioning. Given that her sensory deficits continue to improve since admission (seemingly with improved edema), however, will reach out to PM&R to ask for re-eval and determination of need for neuroimaging for AIR qualification.   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  Nephrotic range proteinuria - AKI  Concerned for diabetic nephropathy, but also sent additional labs (infectious studies, autoimmune work-up) for further work-up. Edema over abdomen and extremities has improved with current diuretic regimen, with creatinine downtrending. Consistently w/ UOP of 4-5L  - Nephrology consulted, appreciating recs  - Nephrology placed outpatient VIR consult for renal biopsy  - increased Lisinopril 40 mg daily  - PO Bumex 1 mg bid, trial of discontinuing diuretic 12/27 given robust UOP  ??  Headache - Vertigo  Compazine appears to be somewhat helpful. No benefit from meclizine trial.   - Brain MRI on 12/24 w/o acute abnormalities   - May benefit from vestibular PT on discharge  - Prn IV compazine (first line), Zofran (second line)  - Prn Tylenol   ??  Contraception  Her home OCP (ethinyl estradiol/drospirenone) was held on admission. She does poorly with her period (often with HS flares associated). Discussed with pharmacy, and will continue to hold OCPs for now, given hypercoagulability associated with nephrotic syndrome.  - holding OCPs   ??  Hyperglycemia - Uncontrolled T2DM with insulin dependence  BG elevated to >400 on admission (no AG or acidemia to suggest DKA). Continuing to titrate insulin regimen.  - increased Lantus to 60 U qhs  - Continue correctional lispro  - Holding metformin  - Anticipate outpatient referral to [facility] endocrinology (pt's request)  - Jardiance 10 mg daily   ??  Elevated lactate, resolved  Lactate peaked at 5.8, then downtrended. No clear infectious source, with blood cultures NGTD and with skin exam consistent with intertrigo but no clear signs of cellulitis.   - Stopped cefpime, with blood cx NGTD   - Holding metformin  - Derm consulted re: possible link to infliximab infusion (unlikely)  ????   Intertrigo  Exam consistent with intertrigo under pannus; no clear cellulitic component.   - BID nystatin powder  ??  Hydradenitis suppurativa - Psoriasis   Managed by  dermatology outpatient; on IV Renflexis, finasteride, and clindamycin.   - Anticipate OP dermatology follow up on discharge  - S/p Renflexis infusion 12/19  - Continue clindamycin, finasteride  ??  Cardiomegaly on CT  TTE technically difficult, but LVEF estimated at >55% with mildly increased wall thickness. Low suspicion for cardiac etiology of edema.  ??  Hypothyroidism??- Continue home Synthroid 50 mcg every day + 25 mcg T/Th  HTN - Lisinopril and Bumex as above   HLD - Continue home Lipitor  Sleep Apnea - Does not tolerate CPAP at bedtime  GERD - Continue home PPI  ??  Daily Checklist:  Diet:??Carbohydrate Restricted  DVT PPx:??Lovenox  Electrolytes:??Replete prn  Code Status:??Full Code    Team Contact Information:   Primary Team: Internal Medicine (MEDW)  Primary Resident: Katrine Coho, MD  Resident's Pager: [phone] (Gen MedW Intern - Cliffton Asters)    Interval History:   No acute events overnight.    Patient tearful this morning describing emotional experience without OCPs at the moment. Patient not used to having period and feels caught off guard with the influx of emotions. Requesting pet therapy. Given holiday today, will work on setting this up tomorrow.     Objective:     Gen: tearful female sitting up in bed, answers questions appropriately  Eyes: sclera anicteric, EOMI  HENT: atraumatic, MMM, OP w/o erythema or exudate   Heart: RRR, no murmurs appreciated   Lungs: CTAB, no crackles or wheezes, no use of accessory muscles  Abdomen: Normoactive bowel sounds, soft, pannus edema improved rebound/guarding  Extremities: improving BL LE edema, improved neuropathy in LLE  Psych: Alert, oriented, appropriate mood and affect    Labs/Studies: Labs and Studies from the last 24hrs per EMR and Reviewed

## 2021-08-01 NOTE — Unmapped (Addendum)
Pt is A&Ox4. C/o pain and PRN pain meds given with effective. Pt has participated PT/OT and is able to sit at bedside. Pt has been encouraged to participate daily activities. Family at bedside. The plan of care has been updated. Pt remains free from falls or injuries. VSS. Cont to monitor.  Problem: Adult Inpatient Plan of Care  Goal: Plan of Care Review  Outcome: Progressing  Flowsheets (Taken 07/31/2021 1824)  Progress: improving  Plan of Care Reviewed With: patient  Goal: Patient-Specific Goal (Individualized)  Outcome: Progressing  Flowsheets (Taken 07/31/2021 1824)  Patient-Specific Goals (Include Timeframe): Patient will remain with out infection by discharge.  Individualized Care Needs: Falls, foley care, diuretics, labs, blood sugar.  Anxieties, Fears or Concerns: none voiced  Goal: Absence of Hospital-Acquired Illness or Injury  Outcome: Progressing  Intervention: Identify and Manage Fall Risk  Recent Flowsheet Documentation  Taken 07/31/2021 0800 by Lonia Mad, RN  Safety Interventions:  ??? environmental modification  ??? fall reduction program maintained  ??? family at bedside  ??? infection management  ??? lighting adjusted for tasks/safety  ??? low bed  ??? muscle strengthening facilitated  Intervention: Prevent and Manage VTE (Venous Thromboembolism) Risk  Recent Flowsheet Documentation  Taken 07/31/2021 0800 by Lonia Mad, RN  Activity Management:  ??? activity adjusted per tolerance  ??? activity encouraged  ??? bedrest  Intervention: Prevent Infection  Recent Flowsheet Documentation  Taken 07/31/2021 0800 by Lonia Mad, RN  Infection Prevention:  ??? environmental surveillance performed  ??? equipment surfaces disinfected  ??? hand hygiene promoted  ??? single patient room provided  ??? rest/sleep promoted  Goal: Optimal Comfort and Wellbeing  Outcome: Progressing  Goal: Readiness for Transition of Care  Outcome: Progressing  Goal: Rounds/Family Conference  Outcome: Progressing     Problem: Impaired Wound Healing  Goal: Optimal Wound Healing  Outcome: Progressing  Intervention: Promote Wound Healing  Recent Flowsheet Documentation  Taken 07/31/2021 0800 by Lonia Mad, RN  Activity Management:  ??? activity adjusted per tolerance  ??? activity encouraged  ??? bedrest     Problem: Self-Care Deficit  Goal: Improved Ability to Complete Activities of Daily Living  Outcome: Progressing     Problem: Skin Injury Risk Increased  Goal: Skin Health and Integrity  Outcome: Progressing  Intervention: Optimize Skin Protection  Recent Flowsheet Documentation  Taken 07/31/2021 0800 by Lonia Mad, RN  Head of Bed [facility] Positioning:  ??? HOB elevated  ??? HOB at 20-30 degrees     Problem: Infection  Goal: Absence of Infection Signs and Symptoms  Outcome: Progressing  Intervention: Prevent or Manage Infection  Recent Flowsheet Documentation  Taken 07/31/2021 0800 by Lonia Mad, RN  Infection Management: aseptic technique maintained     Problem: Pain Acute  Goal: Acceptable Pain Control and Functional Ability  Outcome: Progressing     Problem: Hypertension Acute  Goal: Blood Pressure Within Desired Range  Outcome: Progressing

## 2021-08-01 NOTE — Unmapped (Signed)
Patient A&Ox4. VSS on room air. No acute events overnight. PRN meds given for pain and constipation (see MAR). Patient unable to have bowel movement since 12/24. Opted for senokot and colace for constipation. Refused miralax, but willing to try during day shift if senokot and colace unsuccessful. Patient able to have small bowel movement. No other complaints or concerns. Husband bedside assisting with ADLs. Foley in place and draining yellow/straw urine without complications. Patient able to turn in bed. Patient uses bedpan. Bed alarm on. Call bell and tray table within reach. Bed locked and in lowest position. Will continue to monitor.     Problem: Adult Inpatient Plan of Care  Goal: Plan of Care Review  08/01/2021 0352 by Jodi Mourning, RN  Outcome: Ongoing - Unchanged  08/01/2021 0320 by Jodi Mourning, RN  Outcome: Ongoing - Unchanged  Goal: Patient-Specific Goal (Individualized)  08/01/2021 0352 by Jodi Mourning, RN  Outcome: Ongoing - Unchanged  Flowsheets (Taken 08/01/2021 [phone])  Patient-Specific Goals (Include Timeframe): Patient will remain free from falls or injuries this shift.  Individualized Care Needs: Monitor labs, VS, BG. Foley care. Falls precautions  Anxieties, Fears or Concerns: No concerns expressed  08/01/2021 0320 by Jodi Mourning, RN  Outcome: Ongoing - Unchanged  Goal: Absence of Hospital-Acquired Illness or Injury  08/01/2021 0352 by Jodi Mourning, RN  Outcome: Ongoing - Unchanged  08/01/2021 0320 by Jodi Mourning, RN  Outcome: Ongoing - Unchanged  Intervention: Identify and Manage Fall Risk  Recent Flowsheet Documentation  Taken 07/31/2021 2000 by Jodi Mourning, RN  Safety Interventions:  ??? bed alarm  ??? environmental modification  ??? fall reduction program maintained  ??? family at bedside  ??? lighting adjusted for tasks/safety  ??? low bed  ??? nonskid shoes/slippers when out of bed  Intervention: Prevent Skin Injury  Recent Flowsheet Documentation  Taken 08/01/2021 0352 by Jodi Mourning, RN  Skin Protection: adhesive use limited  Intervention: Prevent and Manage VTE (Venous Thromboembolism) Risk  Recent Flowsheet Documentation  Taken 08/01/2021 0352 by Jodi Mourning, RN  Activity Management:  ??? activity adjusted per tolerance  ??? activity encouraged  Intervention: Prevent Infection  Recent Flowsheet Documentation  Taken 07/31/2021 2000 by Jodi Mourning, RN  Infection Prevention:  ??? environmental surveillance performed  ??? hand hygiene promoted  ??? single patient room provided  ??? rest/sleep promoted  Goal: Optimal Comfort and Wellbeing  08/01/2021 0352 by Jodi Mourning, RN  Outcome: Ongoing - Unchanged  08/01/2021 0320 by Jodi Mourning, RN  Outcome: Ongoing - Unchanged  Intervention: Monitor Pain and Promote Comfort  Recent Flowsheet Documentation  Taken 08/01/2021 0352 by Jodi Mourning, RN  Pain Management Interventions: medication offered  Goal: Readiness for Transition of Care  08/01/2021 0352 by Jodi Mourning, RN  Outcome: Ongoing - Unchanged  08/01/2021 0320 by Jodi Mourning, RN  Outcome: Ongoing - Unchanged  Goal: Rounds/Family Conference  08/01/2021 0352 by Jodi Mourning, RN  Outcome: Ongoing - Unchanged  08/01/2021 0320 by Jodi Mourning, RN  Outcome: Ongoing - Unchanged     Problem: Impaired Wound Healing  Goal: Optimal Wound Healing  08/01/2021 0352 by Jodi Mourning, RN  Outcome: Ongoing - Unchanged  08/01/2021 0320 by Jodi Mourning, RN  Outcome: Ongoing - Unchanged  Intervention: Promote Wound Healing  Flowsheets (Taken 08/01/2021 0352)  Activity Management:  ??? activity adjusted per tolerance  ??? activity encouraged  Pain Management Interventions: medication offered  Problem: Self-Care Deficit  Goal: Improved Ability to Complete Activities of Daily Living  08/01/2021 0352 by Jodi Mourning, RN  Outcome: Ongoing - Unchanged  08/01/2021 0320 by Jodi Mourning, RN  Outcome: Ongoing - Unchanged  Intervention: Promote Activity and Functional Independence  Flowsheets (Taken 08/01/2021 0352)  Activity Assistance Provided: assistance, 2 people  Self-Care Promotion:  ??? BADL personal objects within reach  ??? BADL personal routines maintained     Problem: Skin Injury Risk Increased  Goal: Skin Health and Integrity  08/01/2021 0352 by Jodi Mourning, RN  Outcome: Ongoing - Unchanged  08/01/2021 0320 by Jodi Mourning, RN  Outcome: Ongoing - Unchanged  Intervention: Optimize Skin Protection  Flowsheets (Taken 08/01/2021 0352)  Pressure Reduction Techniques: frequent weight shift encouraged  Pressure Reduction Devices:  ??? pressure-redistributing mattress utilized  ??? specialty bed utilized  Skin Protection: adhesive use limited     Problem: Infection  Goal: Absence of Infection Signs and Symptoms  08/01/2021 0352 by Jodi Mourning, RN  Outcome: Ongoing - Unchanged  08/01/2021 0320 by Jodi Mourning, RN  Outcome: Ongoing - Unchanged  Intervention: Prevent or Manage Infection  Flowsheets  Taken 08/01/2021 0352  Infection Management: aseptic technique maintained  Taken 07/31/2021 2000  Infection Management: aseptic technique maintained     Problem: Pain Acute  Goal: Acceptable Pain Control and Functional Ability  08/01/2021 0352 by Jodi Mourning, RN  Outcome: Ongoing - Unchanged  08/01/2021 0320 by Jodi Mourning, RN  Outcome: Ongoing - Unchanged  Intervention: Develop Pain Management Plan  Recent Flowsheet Documentation  Taken 08/01/2021 0352 by Jodi Mourning, RN  Pain Management Interventions: medication offered  Intervention: Prevent or Manage Pain  Recent Flowsheet Documentation  Taken 08/01/2021 0352 by Jodi Mourning, RN  Sensory Stimulation Regulation:  ??? quiet environment promoted  ??? care clustered  Medication Review/Management: medications reviewed  Taken 08/01/2021 0320 by Jodi Mourning, RN  Sensory Stimulation Regulation: quiet environment promoted  Medication Review/Management: medications reviewed     Problem: Hypertension Acute  Goal: Blood Pressure Within Desired Range  08/01/2021 0352 by Jodi Mourning, RN  Outcome: Ongoing - Unchanged  08/01/2021 0320 by Jodi Mourning, RN  Outcome: Ongoing - Unchanged  Intervention: Normalize Blood Pressure  08/01/2021 0352 by Jodi Mourning, RN  Flowsheets (Taken 08/01/2021 [phone])  Sensory Stimulation Regulation:  ??? quiet environment promoted  ??? care clustered  Medication Review/Management: medications reviewed  08/01/2021 0320 by Jodi Mourning, RN  Flowsheets (Taken 08/01/2021 0320)  Sensory Stimulation Regulation: quiet environment promoted  Medication Review/Management: medications reviewed

## 2021-08-01 NOTE — Unmapped (Signed)
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   ??  Principal Problem:    Lactic acidosis  Active Problems:    Hypertension    Hyperglycemia due to type 2 diabetes mellitus (CMS-HCC)    Proteinuria    CKD (chronic kidney disease)    Hyponatremia    Axillary hidradenitis suppurativa    Psoriasis    Hypothyroidism    Hyperlipidemia  Resolved Problems:    * No resolved hospital problems. *  ??  Weakness - Left leg sensory deficits - Urinary retention - Back pain  Unclear etiology, but symptoms have continued to improve with significant diuresis and improvement in edema. Suspect that etiology is peripheral in nature as opposed to a central spinal pathology. Patient unable to tolerate MRI and would be poor candidate for CT myelogram due to body habitus.  - Urinary catheter placed 12/21 w/ >1 L UOP, anticipate 1 week w/ catheter for possible bladder stretch injury. 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  Nephrotic range proteinuria - AKI  Suspect due to diabetic nephropathy, but also sent additional labs (infectious studies, autoimmune work-up) for further work-up. Creatinine downtrending. Robust UOP of 4-5L  - Nephrology following  - Plan for outpatient renal biopsy with VIR per Nephro  - continue Lisinopril 40 mg daily  - Holding Bumex per Nephro given autodiuresis   ??  Headache - Vertigo  - Brain MRI on 12/24 w/o acute abnormalities   - Prn IV compazine (first line), Zofran (second line)  - Prn Tylenol   ??  Contraception  Her home OCP (ethinyl estradiol/drospirenone) was held on admission. She does poorly with her period (often with HS flares associated). Discussed with pharmacy, and will continue to hold OCPs for now, given hypercoagulability associated with nephrotic syndrome.  - holding OCPs   ??  Hyperglycemia - Uncontrolled T2DM with insulin dependence  BG elevated to >400 on admission (no AG or acidemia to suggest DKA). Continuing to titrate insulin regimen.  - Fasting BG continues to be elevated > 200  - Continue Lantus to 60 U qhs  - Start 5U Lispro TIDAC + SSI  - Holding metformin  - Plan for outpatient referral to [facility] endocrinology  - Continue Jardiance 10 mg daily   ??  Intertrigo  Exam consistent with intertrigo under pannus; no clear cellulitic component.   - BID nystatin powder  ??  Hydradenitis suppurativa - Psoriasis   Managed by dermatology outpatient; on IV Renflexis, finasteride, and clindamycin.   - Anticipate OP dermatology follow up on discharge  - S/p Renflexis infusion 12/19  - Continue clindamycin, finasteride  ??  Hypothyroidism??- Continue home Synthroid 50 mcg every day + 25 mcg T/Th  HTN - Lisinopril and Bumex as above   HLD - Continue home Lipitor  Sleep Apnea - Does not tolerate CPAP at bedtime  GERD - Continue home PPI  ??  Daily Checklist:  Diet:??Carbohydrate Restricted  DVT PPx:??Lovenox  Electrolytes:??Replete prn  Code Status:??Full Code    Team Contact Information:   Primary Team: Internal Medicine (MEDW)  Primary Resident: Jaclynn Major, MD  Resident's Pager: [phone] (Gen MedW Intern - Cliffton Asters)    Interval History:   No acute events overnight.    In good spirits this AM. Reports weakness and numbness in her lower extremities is significantly improved. No other complaints.    Objective:     Gen: in NAD, answers questions appropriately  Eyes: sclera anicteric, EOMI  HENT: atraumatic, MMM, OP w/o erythema or exudate   Heart: RRR, no murmurs appreciated   Lungs: CTAB, no crackles or wheezes, no use of accessory muscles  Abdomen: Normoactive bowel sounds, soft, pannus edema improved rebound/guarding  Extremities: improving BL LE edema, improved neuropathy in LLE  Psych: Alert, oriented, appropriate mood and affect    Labs/Studies: Labs and Studies from the last 24hrs per EMR and Reviewed    Luci Bank, MD  [facility] Anesthesiology, PGY-2

## 2021-08-01 NOTE — Unmapped (Addendum)
Physical Medicine and Rehab  Follow Up Consult Note    Requesting Attending Physician: Jearld Pies, MD  Service Requesting Consult: Med General Welt (MDW)    ASSESSMENT / RECOMMENDATIONS:     Nataley Bahri is a 45 y.o. female with past medical history of uncontrolled type 2 diabetes, polyneuropathy, hypothyroidism,  psoriasis complicated by arthritis, fibromyalgia, and sleep apnea admitted to [facility] 12/20 for severe hyperglycemia and lactic acidosis and nephrotic range proteinuria along with worsening left sided weakness, urinary retention and saddle anesthesia. MRI brain negative, unable to tolerate further studies.   The patient is seen in consultation for evaluation of rehabilitation needs.    Functional Impairment  Left more than right leg weakness, urinary retention requiring foley in the setting of neck and low back pain.  MRI brain negative. Patient unable to tolerate close spaces so spine MRI not obtainable  As weakness / numbness has improved and no major spine issues seen on CT scan, can hold off on additional imaging for now.   Patient may also have plexopathy and has know neuropathy, so EMG/NCS could be considered however due to body habitus, may be more challenging to perform. Since symptoms are improving with better glucose control, can hold off on EMG.     Outpatient renal biopsy pending.   Agree with TOV in AM.    - Please continue to have patient work with PT and OT to maximize functional status with mobility and ADLs.    Functional Goals  Get stronger and walk    Rehabilitation Plan of Care    - Patient has complex rehab, nursing, and medical needs and may become appropriate for Acute Inpatient Rehabilitation with regular therapy participation / demonstrated progress, completion of medical workup / formulation of treatment plan and confirmation of necessary assistance at home.  In AIR the patient would be expected to tolerate minimum of 3 hrs of therapy daily, 5x/week.     - Managed medicaid insurance process started on 12/22. Still waiting for auth.    - Due to the shared treatment spaces inherent to inpatient rehabilitation, [facility] is now requiring a negative COVID-19 test prior to admission.  The patient had a negative COVID-19 test on 12/19.    *This PM&R consult does not guarantee that patient has been accepted to [facility], but can be helpful to guide patient progression.*  In order for the patient to be considered for admission to [facility] inpatient rehab, the case manager must give the patient / family choice in post-acute discharge options AND if the patient desires [facility], place a referral order to [facility] inpatient rehab. A referral for [facility] inpatient rehab has been received. The case manager may contact the Rehab Intake Coordinator with questions about acceptance to [facility],  bed availability and insurance authorization.     Darrick Grinder, MD    Thank you for this consult.  Please contact the PM&R consult pager [phone] for questions regarding these recommendations.  For questions of bed availability at [facility], contact the Intake Office at [phone].      SUBJECTIVE:     Reason for Consult: Patient seen in consultation at the request of Jearld Pies, MD for evaluation of rehabilitation needs and recommendations.  Chief Complaint: weakness, numbness  History of Present Illness: Eron Goble is a 45 y.o. female with a past medical history of uncontrolled type 2 diabetes, polyneuropathy, hypothyroidism, at bedtime, psoriasis complicated by arthritis, fibromyalgia, and sleep apnea admitted  to [facility] 12/20 for severe hyperglycemia and lactic acidosis and nephrotic range proteinuria.  Patient states she has been getting weaker since being on bedrest for left achilles tear a few months ago. She has been mainly wheelchair bound but is able to do stand pivot transfers. She also had back pain and saddle anesthesia for the past few months, but imaging deferred by PCP due to size.  More recently since her last infliximab infusion, she has had worsening left leg weakness and swelling. She was also found to be retaining urine requiring foley placement. She states she has scattered numbness in arms and body in addition to feet.     Prior Functional Status:    Prior Functional Status: Patient was ambulatory roughly 1 year ago but tore her achilles and was placed on bedrest for 6 months before starting OP PT. Patient currently unable to take more than 5 steps before needing to sit down, uses WC when she leaves her home but primarily stays in her bedroom. 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    Current Functional Status: Therapy notes reviewed and analyzed     Activities of Daily Living:   Per OT 12/27  Lower Body Dressing assistance needs: A lot - Maximum/Moderate Assistance  Bathing assistance needs: A lot - Maximum/Moderate Assistance  Toileting assistance needs: A lot - Maximum/Moderate Assistance  Upper Body Dressing assistance needs: A Little - Minimal/Contact Guard Assist/Supervision  Personal Grooming assistance needs: A Little - Minimal/Contact Guard Assist/Supervision  Eating Meals assistance needs: A Little - Minimal/Contact Guard Assist/Supervision  Per OT 12/27  Pt completed bed mobility supine to seated with SBA and extra time to scoot to edge of bed. Pt completed sit to stand with CGA and BRW for sidesteps to [facility] to sit. Per pt request, pt completed functional mobility in hallway for 1 lap around unit with BWC and Max A to propel. Pt with additional sit to stand from [facility] with CGA and BRW once back in room.Pt left seated in [facility] per pt request.    Assessment  Problem List: Impaired sensation, Increased edema, Impaired ADLs, Fall risk, Decreased mobility, Decreased strength  Clinical Decision Making: Moderate  Assessment: Pt agreeable to therapy session and with continued progress towards OT goals. This date pt with imrpoved transfers completing bed mobility, sit to stand from edge of bed and from [facility] sitting surface, and sidesteps to [facility] for propulsion of WC in hallway around unit. Pt reported fear of falling during sidesteps at edge of bed, benefitting from encouragement to continue with transfer. Pt will continue to benefit from acute OT services to address deficits related to strength, activity tolerance, and deconditioning. Recommendation of 5x high upon DC remains appropriate. Continue POC.  Today's Interventions: Bed mobility, Balance activities, Safety education, Transfer training, Functional mobility, Education - Patient, Education - Family / caregiver  Today's Interventions: Pt educated on OT role and POC, importance of out of bed mobility. Pt completed LB dressing to don shoes and socks, bed mobility supine to seated edge of bed, sit to stand from edge of bed, sidesteps to [facility], functional mobility in room and in hallway with WC to access immediate environment and replicate distances between ADL tasks at home, sit to stand from [facility].     OT Post Acute Discharge Recommendations: 5x weekly, High intensity   OT DME Recommendations: Defer to post  acute    Mobility:   Per PT 12/27  Sit to stad CGA with RW x 3  Side step eob x 2 feet, step in place x 12 reps  ---------------------  Bed Mobility: supine to sit SBA, sit to supine min A for LEs     Balance comment: static sitting EOB supervision, static standing min A x 2 with RW  Gait: Patient able to march in place with RW and min A, decreased foot clearance L > R 2/2 weakness  PT Post Acute Discharge Recommendations: 5x weekly, High intensity    Cognition, Swallow, Speech:   Cognition / Swallow / Speech  Patient's Vision Adequate to Safely Complete Daily Activities: No  Patient's Judgement Adequate to Safely Complete Daily Activities: No  Patient's Memory Adequate to Safely Complete Daily Activities: No  Patient Able to Express Needs/Desires: Yes  Patient has speech problem: No             Assistive Devices: Rollator, Wheelchair-manual    Precautions:  Safety Interventions  Safety Interventions: low bed, family at bedside, fall reduction program maintained  Infection Management: aseptic technique maintained    Medical / Surgical History:   Past Medical History:   Diagnosis Date   ??? CKD (chronic kidney disease) 07/25/2021   ??? Diabetes mellitus (CMS-HCC)    ??? Disease of thyroid gland    ??? Fibromyalgia    ??? Hydradenitis    ??? Psoriasis    ??? Psoriatic arthritis (CMS-HCC)    ??? Sleep apnea      Past Surgical History:   Procedure Laterality Date   ??? IR INSERT PORT AGE GREATER THAN 5 YRS  11/06/2019    IR INSERT PORT AGE GREATER THAN 5 YRS 11/06/2019 Soledad Gerlach, MD IMG VIR [facility]   ??? SKIN BIOPSY     ??? TONSILLECTOMY          Social History:   Social History     Tobacco Use   ??? Smoking status: Former     Packs/day: 2.00     Years: 20.00     Pack years: 40.00     Types: Cigarettes     Quit date: 2013     Years since quitting: 9.9   ??? Smokeless tobacco: Never   Substance Use Topics   ??? Alcohol use: No   ??? Drug use: No       Living Environment: House  Lives With: Spouse  Home Living: One level home, Ramped entrance, Standard height toilet, Walk-in shower    Family History: Reviewed and non-contributory to rehab needs  family history includes Diabetes in her father and mother; Hypertension in her father and mother. She was adopted.    Allergies:   Venom-honey bee, Lamictal [lamotrigine], and Oxycodone    Medications:   Scheduled   ??? aspirin chewable tablet 81 mg Daily   ??? atorvastatin (LIPITOR) tablet 20 mg Daily   ??? cetirizine (ZyrTEC) tablet 10 mg Daily   ??? cholecalciferol (vitamin D3 25 mcg (1,000 units)) tablet 25 mcg Daily   ??? clindamycin (CLEOCIN) capsule 300 mg BID   ??? diclofenac sodium (VOLTAREN) 1 % gel 2 g QID   ??? empagliflozin (JARDIANCE) tablet 10 mg Daily   ??? enoxaparin (LOVENOX) syringe 40 mg Q12H   ??? fenofibrate (TRICOR) tablet 48 mg Daily   ??? finasteride (PROSCAR) tablet 5 mg Daily   ??? insulin glargine (LANTUS) injection 60 Units Nightly   ??? insulin lispro (HumaLOG) injection 0-20 Units ACHS   ???  insulin lispro (HumaLOG) injection 5 Units TID AC   ??? levothyroxine (SYNTHROID) tablet 25 mcg Once per day on Tue Thu   ??? levothyroxine (SYNTHROID) tablet 50 mcg Daily   ??? lisinopriL (PRINIVIL,ZESTRIL) tablet 40 mg Daily   ??? nystatin (MYCOSTATIN) powder 1 application BID   ??? pantoprazole (PROTONIX) EC tablet 40 mg Daily   ??? pneumoc 20-val conj-dip cr(PF) (PREVNAR-20) vaccine 0.5 mL During hospitalization     PRN acetaminophen, 1,000 mg, Q8H PRN  dextrose, 12.5 g, Q15 Min PRN  senna, 1 tablet, BID PRN   And  docusate sodium, 100 mg, BID PRN  glucagon, 1 mg, Once PRN  glucose, 16 g, Q10 Min PRN  LORazepam, 2 mg, Daily PRN  meclizine, 25 mg, TID PRN  ondansetron, 8 mg, Q6H PRN  polyethylene glycol, 17 g, Daily PRN  prochlorperazine, 5 mg, Q6H PRN      Continuous Infusions      Review of Systems:    General ROS: positive for  - fatigue and dizziness  Psychological ROS: negative  Ophthalmic ROS: negative  Respiratory ROS: no cough, shortness of breath, or wheezing  Cardiovascular ROS: no chest pain or dyspnea on exertion  Gastrointestinal ROS: no abdominal pain, change in bowel habits, or black or bloody stools  Genito-Urinary ROS: positive for - retention  Musculoskeletal ROS: positive for - gait disturbance, muscular weakness and pain in back - lower  Neurological ROS: no TIA or stroke symptoms  Dermatological ROS: negative  Full 10 systems reviewed and neg, unless noted in HPI  OBJECTIVE:     Vitals:  Temp:  [36.7 ??C (98.1 ??F)-37.2 ??C (99 ??F)] 37.2 ??C (99 ??F)  Heart Rate:  [104-114] 110  Resp:  [16-18] 16  BP: (137-145)/(74-87) 137/85  MAP (mmHg):  [93-102] 97  SpO2:  [96 %-98 %] 96 %    Physical Exam:    GEN: Lying in bed in NAD.  HEENT: Atraumatic. Normocephalic. Moist mucous membranes. Trachea midline.  RESP: NWOB on RA.  CV: RRR, + leg edema edema  GI: abd soft, NTND  GU: + Foley   SKIN: no rashes or ecchymoses on exposed skin  MSK:  no notable contractures, no visible swelling or erythema over joints, joints NTTP  NEURO:  Mental Status: A&Ox3, attention intact, speech fluid and coherent, follows commands  well  Visual acuity intact. Visual fields intact to confrontation. Pupils equal. Extra ocular movements intact. Facial sensation intact bilaterally. No facial droop. Tongue protrudes midline.  Sensory: BUE and BLE sensation decreased in hands and lower leg  Motor:     RUE/LUE: shoulder abd 4+/4+, biceps 4+/4+, triceps 5/5, wrist extension 5/5, hand grasp 4+/4+    RLE/LLE: hip flexion 3-/2+, knee extension 3+/3-, DF 4/3, 1st toe extension 4/3 PF 4/3  Tone: within normal limits, no spasticity noted  Reflexes: no clonus, bilateral  Cerebellar: no abnml or extraneous mvmts, FNF wnl no dysmetria, no pronator drift  PSYCH: mood euthymic, affect appropriate, thought process logical     Labs and Diagnostic Studies: Reviewed   CBC -   Results in Past 2 Days  Result Component Current Result   WBC 4.1 (08/01/2021)   RBC 4.02 (08/01/2021)   HGB 12.1 (08/01/2021)   HCT 36.0 (08/01/2021)   MCV 89.5 (08/01/2021)   MCH 30.2 (08/01/2021)   MCHC 33.7 (08/01/2021)   MPV 8.8 (08/01/2021)   Platelet 265 (08/01/2021)     BMP -   Results in Past 2 Days  Result Component Current Result  Sodium 141 (08/01/2021)   Potassium 3.6 (08/01/2021)   Chloride 101 (08/01/2021)   CO2 28.0 (08/01/2021)   BUN 16 (08/01/2021)   Creatinine 0.91 (H) (08/01/2021)   EST.GFR (MDRD) Not in Time Range   Glucose 231 (H) (08/01/2021)     Coagulation -   No results found for requested labs within last 2 days.     Cardiac markers -   No results found for requested labs within last 2 days.     LFT's -   No results found for requested labs within last 2 days.       Radiology Results: Reviewed   ECG 12 Lead    Result Date: 07/25/2021  SINUS TACHYCARDIA CANNOT RULE OUT ANTERIOR INFARCT  (CITED ON OR BEFORE 09-Oct-2017) ABNORMAL ECG WHEN COMPARED WITH ECG OF 09-Oct-2017 00:05, T WAVE INVERSION NOW EVIDENT IN LATERAL LEADS Confirmed by Mariane Baumgarten ([id number]) on 07/25/2021 5:34:03 PM    XR Chest Portable    Result Date: 07/24/2021  EXAM: XR CHEST PORTABLE DATE: 07/24/2021 8:22 PM ACCESSION: [id number] UN DICTATED: 07/24/2021 8:32 PM INTERPRETATION LOCATION: Main [facility]     CLINICAL INDICATION: 45 year old female with dyspnea.      COMPARISON: October 08, 2017.     TECHNIQUE: Portable Chest Radiograph.     FINDINGS:     Right IJ Port-A-Cath with tip terminating at the cavoatrial junction.     No focal consolidation, pleural fluid, or pneumothorax.             No acute findings.    CT Abdomen Pelvis W Contrast    Result Date: 07/25/2021  EXAM: CT ABDOMEN PELVIS W CONTRAST DATE: 07/25/2021 11:38 AM ACCESSION: [id number] UN DICTATED: 07/25/2021 11:50 AM INTERPRETATION LOCATION: [facility]     CLINICAL INDICATION: 45 years old with concern for pancreatitis.      COMPARISON: CTA chest 07/24/2021     TECHNIQUE: A helical CT scan of the abdomen and pelvis was obtained following IV contrast from the lung bases through the pubic symphysis. Images were reconstructed in the axial plane. Coronal and sagittal reformatted images were also provided for further evaluation.     FINDINGS:     LOWER CHEST: Partially imaged central venous catheter with tip terminating in the right atrium. The visualized lung fields are clear. No pericardial effusion.     LIVER: Normal liver contour.  No focal liver lesions.     BILIARY: No biliary ductal dilatation.  The gallbladder is normal in appearance.     SPLEEN: Normal in size and contour.     PANCREAS: Normal pancreatic contour.  No focal lesions.  No ductal dilation. No peripancreatic fluid collection or and surrounding inflammatory stranding.     ADRENAL GLANDS: The right adrenal gland is unremarkable. 0.9 cm hypoattenuating nodule at the left adrenal gland (4:23).     KIDNEYS/URETERS: Symmetric renal enhancement.  No hydronephrosis. No enhancing renal masses.     BLADDER: Unremarkable.     REPRODUCTIVE ORGANS: Unremarkable.     GI TRACT: No findings of bowel obstruction or acute inflammation.  Normal appendix. Scattered colonic diverticulum without evidence of diverticulitis.     PERITONEUM, RETROPERITONEUM AND MESENTERY: No free air.  No ascites.  No fluid collection.     LYMPH NODES: Scattered prominent and mildly enlarged lymph nodes, representative nodes as follows: -1 cm portacaval lymph node (4:43). -1.1 cm periaortic node (4:62). -1 cm right pelvic sidewall lymph node (4:128). -Bilateral 1 cm external iliac chain lymph nodes (4:133, 138).  These are indeterminate but could be reactive.     VESSELS: Normal caliber aorta. Portal venous system is patent. The hepatic veins are patent. The IVC is unremarkable.     BONES and SOFT TISSUES: No aggressive osseous lesions. Dominant Soft tissue edema/stranding within the anterior lower abdominal wall pannus with associated anterior skin thickening. No focal soft tissue lesions.         --No evidence of acute pancreatitis is clinically questioned.     --Diffuse soft tissue edema, fatty stranding, and associated skin thickening at the anterior inferior abdominal wall pannus. Findings may reflect edema versus cellulitis. Recommend clinical correlation.     --Scattered mildly enlarged lymph nodes throughout the retroperitoneum and pelvis, findings are nonspecific and may be reactive in nature. Recommend clinical correlation.     --Subcentimeter hypoattenuating nodule at the left adrenal gland, given size findings are likely benign. Recommend attention on follow-up imaging.     --Additional chronic and incidental findings, as above.    ED POCUS Chest Bilateral    Result Date: 07/24/2021  Limited Cardiac Ultrasound (CPT [phone])     Indication: A focused ultrasound exam of the heart was performed to evaluate for pericardial effusion, tamponade, severe hypovolemia, or gross abnormalities of cardiac anatomy or function in this patient. The ultrasound was performed with the following indications, as noted in the H&P: Dyspnea, Concern for effusion, and Other indications as noted in the H&P     Identified structures: The pericardial sac, myocardium, and 4 chambers were identified using the following views: parasternal long axis and parasternal short axis     Findings: Exam of the above structures revealed the following findings:      Pericardial effusion: Absent   Pericardial tamponade: N/A  Global LV function: Normal  Right ventricular size: Normal   Signs of RV strain: N/A  IVC: Normal      Other findings: Large anterior fat pad     Limitations: None.     Impression: Normal cardiac ultrasound     No sonographic evidence of significant cardiac dysfunction and No sonographic evidence of significant pericardial effusion     Other: None     Interpreted by: Raechel Ache, MD     Quality Assurance  After review of the point-of-care ultrasound performed in this case I assess the overall image quality as: Image quality: Minimal criteria met for diagnosis, recognizable structures but with some technical or other flaws  The accuracy of interpretation of images as presented reflects a true negative.     This study does meet minimum criteria for credentialing and billing.     Erle Crocker, MD             Echocardiogram W Colorflow Spectral Doppler With Contrast    Result Date: 07/25/2021  Patient Info Name:     JUNE RODE [name] Age:     45 years DOB:     1976-03-28 Gender:     Female MRN:     [id number] Accession #:     [id number] UN Ht:     168 cm Wt:     170 kg BSA:     2.93 m2 Technical Quality:     Poor Exam Date:     07/25/2021 2:00 PM Site Location:     [facility] Exam Location:     [facility] Admit Date:     07/24/2021     Exam Type:     ECHOCARDIOGRAM W COLORFLOW SPECTRAL DOPPLER WITH CONTRAST  Study Info Indications      - cardiomegaly,  establish EF Complete two-dimensional, color flow and Doppler transthoracic echocardiogram is performed with contrast to opacify the left ventricle and to improve the delineation of the left ventricle endocardial borders.     Staff Referring Physician:     [phone], [email or web address]; Reading Fellow:     Malka So MD Sonographer:     Redmond Pulling Ordering Physician:     Benjaman Kindler     Account #:     [id number]     Ultrasound Enhancing Agent/Agitated Saline     ------------------------------ UEA/Ag. Saline:     Lumason Amount:     --- ml     Reason for Poor Study:     poor echocardiographic windows         Summary   1. The left ventricle is normal in size with mildly increased wall thickness.   2. The left ventricular systolic function is normal, LVEF is visually estimated at > 55%.   3. Mitral annular calcification is present (mild).   4. The mitral valve leaflets are mildly thickened with normal leaflet mobility.   5. The right ventricle is not well visualized but probably normal in size, with normal systolic function.   6. Technically difficult study.   7. An ultrasound enhancing agent was used to improve the visualization of the left ventricular cavity and endocardial borders.         Left Ventricle   The left ventricle is normal in size with mildly increased wall thickness.   The left ventricular systolic function is normal, LVEF is visually estimated at > 55%.   Left ventricular diastolic function cannot be accurately assessed.     Right Ventricle   Right ventricle is not well visualized.   The right ventricle is not well visualized but probably normal in size, with normal systolic function.         Left Atrium   The left atrium is normal in size.     Right Atrium   The right atrium is normal  in size.         Aortic Valve   The aortic valve is trileaflet with normal appearing leaflets with normal excursion.   There is no significant aortic regurgitation.   There is no evidence of a significant transvalvular gradient. Pulmonic Valve   The pulmonic valve is poorly visualized, but probably normal.   There is no significant pulmonic regurgitation.   There is no evidence of a significant transvalvular gradient.     Mitral Valve   Mitral annular calcification is present (mild).   The mitral valve leaflets are mildly thickened with normal leaflet mobility.   There is trivial mitral valve regurgitation.     Tricuspid Valve   The tricuspid valve leaflets are poorly visualized but probably normal, with normal leaflet mobility.   There is trivial tricuspid regurgitation.   The pulmonary systolic pressure cannot be estimated due to insufficient TR signal.         Pericardium/Pleural   There is no pericardial effusion.     Inferior Vena Cava   IVC size and inspiratory change suggest normal right atrial pressure. (0-5 mmHg).     Aorta   The aorta is normal in size in the visualized segments.         Pulmonic Valve ---------------------------------------------------------------------- Name  Value        Normal ----------------------------------------------------------------------     PV Doppler ---------------------------------------------------------------------- PV Peak Velocity                   1.3 m/s     Tricuspid Valve ---------------------------------------------------------------------- Name                                 Value        Normal ----------------------------------------------------------------------     Estimated PAP/RSVP ---------------------------------------------------------------------- RA Pressure                         3 mmHg           <=5     Aorta ---------------------------------------------------------------------- Name                                 Value        Normal ----------------------------------------------------------------------     Ascending Aorta ---------------------------------------------------------------------- Ao Root Diameter (2D)               3.2 cm Ao Root Diam Index (2D)         10.9 cm/m2     Venous ---------------------------------------------------------------------- Name                                 Value        Normal ----------------------------------------------------------------------     IVC/SVC ---------------------------------------------------------------------- IVC Diameter (Exp 2D)               1.4 cm         <=2.1     Aortic Valve ---------------------------------------------------------------------- Name                                 Value        Normal ----------------------------------------------------------------------     AV Doppler ---------------------------------------------------------------------- AV Peak Velocity                   1.3 m/s     Ventricles ---------------------------------------------------------------------- Name                                 Value        Normal ----------------------------------------------------------------------     LV Dimensions 2D/MM ---------------------------------------------------------------------- IVS Diastolic Thickness (2D)        1.3 cm       0.6-0.9 LVID Diastole (2D)                  3.7 cm       3.8-5.2 LVPW Diastolic Thickness (2D)                                1.3 cm       0.6-0.9 LVID Systole (2D)                   2.9 cm       2.2-3.5     Atria ---------------------------------------------------------------------- Name  Value        Normal ----------------------------------------------------------------------     LA Dimensions ---------------------------------------------------------------------- LA Dimension (2D)                   3.3 cm       2.7-3.8         Report Signatures Finalized by Andrey Campanile  MD on 07/25/2021 06:37 PM Resident Jesse Sans Daubert  MD on 07/25/2021 03:37 PM    PVL Venous Duplex Lower Extremity Bilateral    Result Date: 07/25/2021   [facility]     [address]   [place], [place] [address] PVL VENOUS DUPLEX LOWER EXTREMITY BILATERAL Patient Demographics Pt. Name: PATRCIA SCHNEPP [name] Location: Emergency Department MRN:      [id number]             Sex:      F DOB:      [birth year]            Age:      75 years  Study Information Authorizing         [phone] Rosiland Oz      Performed Time       07/24/2021 Provider Name       GAFFNEY                                   6:53:48 PM Ordering Physician  Raechel Ache     Patient Location     [facility] Accession Number    [id number] [place]        Technologist         Eli Phillips                                                               RVT Diagnosis:                               Assisting            Nanda Quinton,                                          Technologist         RVT Ordered Reason For Exam: concern for DVT Indication: SOB, weakness Risk Factors: Limited mobility and (obesity). Protocol The major deep veins from the inguinal ligament to the ankle are assessed for bilaterally for compressibility and color and spectral Doppler flow characteristics. The assessed veins include bilateral common femoral vein, femoral vein in the thigh, popliteal vein, and intramuscular calf veins. The iliac vein is assessed indirectly using Doppler waveform analysis. The great saphenous vein is assessed for compressibility at the saphenofemoral junction, and the small saphenous vein assessed for compressibility behind the knee. Limitations: Body habitus and poor ultrasound/tissue interface.  Right Duplex Findings All veins visualized appear fully compressible. Doppler flow signals demonstrate normal spontaneity, phasicity, and augmentation. Not visualized segments included the proximal calf veins due to the noted limitations.  Left Duplex Findings All veins visualized appear fully compressible. Doppler flow signals  demonstrate normal spontaneity, phasicity, and augmentation. Not visualized segments included the proximal calf veins due to the noted limitations. Unable to demonstrate compression at the left common femoral vein due to body habitus/anatomy; Doppler waveforms at this segment are WNL.  Right Technical Summary No evidence of deep venous obstruction in the lower extremity. No indirect evidence of obstruction proximal to the inguinal ligament. Not visualized segments included the proximal calf veins due to the noted limitations.  Left Technical Summary No evidence of deep venous obstruction in the lower extremity. No indirect evidence of obstruction proximal to the inguinal ligament. Not visualized segments included the proximal calf veins due to the noted limitations.  Final Interpretation Right There is no evidence of DVT in the lower extremity. However, portions of this examination were limited- see technologist comments above. There is no evidence of obstruction proximal to the inguinal ligament or in the common femoral vein. Left There is no evidence of DVT in the lower extremity. However, portions of this examination were limited- see technologist comments above. There is no evidence of obstruction proximal to the inguinal ligament or in the common femoral vein.  Electronically signed by [id number] Jodell Cipro MD on 07/25/2021 at 6:42:25 AM.   Final     CTA Chest W Contrast    Result Date: 07/25/2021  EXAM: CTA Chest for Pulmonary Embolus DATE: 07/24/2021 11:52 PM ACCESSION: [id number] UN DICTATED: 07/25/2021 1:44 AM INTERPRETATION LOCATION: Main Campus     CLINICAL INDICATION: 45 years old Female with Tachycardia + dyspnea, concern for PE      COMPARISON: Chest radiograph 07/24/2021     TECHNIQUE: Contiguous 1 and 2 mm axial images were reconstructed through the chest following a single breath hold helical acquisition.  Images were reformatted in the sagittal and coronal planes.  Multiplanar MIP slabs were created for more thorough evaluation of the pulmonary vasculature. Intravenous contrast was administered.     The examination was performed with limited z-axis coverage and reduced kVp to improve vascular opacification and reduce breath hold and contrast and radiation dose. kVP:  100 DLP:  437.9         FINDINGS:     PULMONARY ARTERIES: No emboli in either lung.  The right heart is not dilated.     LUNGS AND AIRWAYS: The lungs are clear.. The central airways are patent.     PLEURA: No pleural fluid or pneumothorax.,     MEDIASTINUM AND LYMPH NODES: No enlarged intrathoracic or axillary lymph nodes are present.    No other mediastinal abnormalities are present.     HEART AND VASCULATURE: Cardiomegaly. Ascending and descending aorta normal in caliber.    There is no pericardial effusion. Right IJ chest wall Port-A-Cath with tip terminating in the right atrium. Coronary calcifications.     BONES AND SOFT TISSUES: No significant abnormalities.     UPPER ABDOMEN: Mild hepatosplenomegaly, otherwise unremarkable.                 No emboli and no acute findings.     Coronary calcifications.                   For coding purposes:   - This patient was seen by the provider Darrick Grinder, MD).  - This encounter should be coded as a a subsequent hospital care (follow up consultation).

## 2021-08-02 LAB — THSD7A AB: THSD7A AB: NEGATIVE

## 2021-08-02 MED ADMIN — enoxaparin (LOVENOX) syringe 40 mg: 40 mg | SUBCUTANEOUS | @ 13:00:00

## 2021-08-02 MED ADMIN — insulin lispro (HumaLOG) injection 0-20 Units: 0-20 [IU] | SUBCUTANEOUS | @ 13:00:00

## 2021-08-02 MED ADMIN — insulin lispro (HumaLOG) injection 5 Units: 5 [IU] | SUBCUTANEOUS | @ 13:00:00 | Stop: 2021-08-02

## 2021-08-02 MED ADMIN — atorvastatin (LIPITOR) tablet 20 mg: 20 mg | ORAL | @ 13:00:00

## 2021-08-02 MED ADMIN — cholecalciferol (vitamin D3 25 mcg (1,000 units)) tablet 25 mcg: 25 ug | ORAL | @ 13:00:00

## 2021-08-02 MED ADMIN — acetaminophen (TYLENOL) tablet 1,000 mg: 1000 mg | ORAL | @ 13:00:00

## 2021-08-02 MED ADMIN — empagliflozin (JARDIANCE) tablet 10 mg: 10 mg | ORAL | @ 13:00:00

## 2021-08-02 MED ADMIN — insulin glargine (LANTUS) injection 60 Units: 60 [IU] | SUBCUTANEOUS | @ 03:00:00

## 2021-08-02 MED ADMIN — insulin lispro (HumaLOG) injection 10 Units: 10 [IU] | SUBCUTANEOUS | @ 23:00:00

## 2021-08-02 MED ADMIN — aspirin chewable tablet 81 mg: 81 mg | ORAL | @ 13:00:00

## 2021-08-02 MED ADMIN — insulin lispro (HumaLOG) injection 0-20 Units: 0-20 [IU] | SUBCUTANEOUS | @ 03:00:00

## 2021-08-02 MED ADMIN — insulin lispro (HumaLOG) injection 5 Units: 5 [IU] | SUBCUTANEOUS

## 2021-08-02 MED ADMIN — pneumoc 20-val conj-dip cr(PF) (PREVNAR-20) vaccine 0.5 mL: .5 mL | INTRAMUSCULAR | @ 23:00:00 | Stop: 2021-08-02

## 2021-08-02 MED ADMIN — levothyroxine (SYNTHROID) tablet 50 mcg: 50 ug | ORAL | @ 11:00:00

## 2021-08-02 MED ADMIN — influenza vaccine quad (FLUARIX, FLULAVAL, FLUZONE) (6 MOS & UP) 2022-23: .5 mL | INTRAMUSCULAR | @ 23:00:00 | Stop: 2021-08-02

## 2021-08-02 MED ADMIN — lisinopriL (PRINIVIL,ZESTRIL) tablet 40 mg: 40 mg | ORAL | @ 13:00:00

## 2021-08-02 MED ADMIN — insulin lispro (HumaLOG) injection 5 Units: 5 [IU] | SUBCUTANEOUS | @ 18:00:00 | Stop: 2021-08-02

## 2021-08-02 MED ADMIN — nystatin (MYCOSTATIN) powder 1 application: 1 | TOPICAL | @ 13:00:00

## 2021-08-02 MED ADMIN — pantoprazole (PROTONIX) EC tablet 40 mg: 40 mg | ORAL | @ 13:00:00

## 2021-08-02 MED ADMIN — insulin lispro (HumaLOG) injection 0-20 Units: 0-20 [IU] | SUBCUTANEOUS | @ 18:00:00

## 2021-08-02 MED ADMIN — enoxaparin (LOVENOX) syringe 40 mg: 40 mg | SUBCUTANEOUS | @ 03:00:00

## 2021-08-02 MED ADMIN — fenofibrate (TRICOR) tablet 48 mg: 48 mg | ORAL | @ 13:00:00

## 2021-08-02 MED ADMIN — finasteride (PROSCAR) tablet 5 mg: 5 mg | ORAL | @ 13:00:00

## 2021-08-02 MED ADMIN — insulin lispro (HumaLOG) injection 0-20 Units: 0-20 [IU] | SUBCUTANEOUS | @ 23:00:00

## 2021-08-02 MED ADMIN — cetirizine (ZyrTEC) tablet 10 mg: 10 mg | ORAL | @ 13:00:00

## 2021-08-02 MED ADMIN — clindamycin (CLEOCIN) capsule 300 mg: 300 mg | ORAL | @ 03:00:00 | Stop: 2021-08-08

## 2021-08-02 MED ADMIN — clindamycin (CLEOCIN) capsule 300 mg: 300 mg | ORAL | @ 13:00:00 | Stop: 2021-08-08

## 2021-08-02 NOTE — Unmapped (Signed)
Problem: Adult Inpatient Plan of Care  Goal: Plan of Care Review  Outcome: Ongoing - Unchanged  Goal: Patient-Specific Goal (Individualized)  Outcome: Ongoing - Unchanged  Goal: Absence of Hospital-Acquired Illness or Injury  Outcome: Ongoing - Unchanged  Intervention: Identify and Manage Fall Risk  Recent Flowsheet Documentation  Taken 08/01/2021 0800 by Rolly Salter, RN  Safety Interventions:   low bed   fall reduction program maintained   family at bedside  Goal: Optimal Comfort and Wellbeing  Outcome: Ongoing - Unchanged  Goal: Readiness for Transition of Care  Outcome: Ongoing - Unchanged  Goal: Rounds/Family Conference  Outcome: Ongoing - Unchanged     Problem: Impaired Wound Healing  Goal: Optimal Wound Healing  Outcome: Ongoing - Unchanged     Problem: Self-Care Deficit  Goal: Improved Ability to Complete Activities of Daily Living  Outcome: Ongoing - Unchanged     Problem: Skin Injury Risk Increased  Goal: Skin Health and Integrity  Outcome: Ongoing - Unchanged  Intervention: Optimize Skin Protection  Recent Flowsheet Documentation  Taken 08/01/2021 0800 by Rolly Salter, RN  Pressure Reduction Techniques: frequent weight shift encouraged     Problem: Infection  Goal: Absence of Infection Signs and Symptoms  Outcome: Ongoing - Unchanged     Problem: Pain Acute  Goal: Acceptable Pain Control and Functional Ability  Outcome: Ongoing - Unchanged     Problem: Hypertension Acute  Goal: Blood Pressure Within Desired Range  Outcome: Ongoing - Unchanged

## 2021-08-02 NOTE — Unmapped (Incomplete)
PHYSICAL MEDICINE & REHABILITATION  History & Physical       ASSESSMENT:     Summer Russell is a 45 y.o. female with PMHx of HTN, HLD, GERD, uncontrolled type 2 diabetes, polyneuropathy, hypothyroidism,  psoriasis complicated by arthritis (on long-standing infliximab infusions), fibromyalgia, and sleep apnea??admitted to [facility] 12/20 for severe hyperglycemia and lactic acidosis and nephrotic range proteinuria along with worsening left sided weakness, urinary retention and saddle anesthesia.     Impairment Group Code: (Neurologic Conditions) 03.3 Polyneuropath      PLAN:     REHAB:   - PT and OT to maximize functional status with mobility and ADLs as well as prevention of joint contracture   - RT for community re-integration, relaxation, and Support Group  - P&O for assistive devices prn  - Pharmacy consult for patient and family education on medication management   - Patient will be discussed at next interdisciplinary team conference        Lower extremity weakness/Sensory deficits: MRI brain negative (12/24). Patient unable to tolerate close spaces so spine MRI not obtainable. Patient may have lumbosacral plexopathy (such as diabetic amyotrophy vs polyneuropathy) in setting of known neuropathy, so EMG/NCS could be considered, however due to body habitus, may be more challenging to perform. Deferred EMG inpatient as symptoms improved with better glucose control.  [ ]  Plan for outpatient referral to neurology to consider outpatient EMG/NCS  Urinary retention:  ?? Foley removed for TOV on 12/28 prior to admission  ?? If failed void, recommend replacing foley with repeat bladder rest x7 days.   Prophylaxis:  ?? Lovenox 40 mg BID (weight based)  Pain:  ?? Tylenol 1000 mg TID prn    Hyperglycemia - Uncontrolled T2DM with insulin dependence: BG elevated to >400 during acute hospitalization (no AG or acidemia to suggest DKA).   - inpatient consult to family medicine for insulin titration/comangement   - inpatient consult to diabetes education  - Continue Jardiance 10 mg daily??  - Continue Lantus to 60 U qhs  -  10U Lispro TIDAC + SSI  - Holding metformin  [ ]  plan for outpatient referral to [facility] endocrinology    Nephrotic range proteinuria: Nephrology consulted during acute hospitalization due to AKI, proteinuria and elevated lactate. 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  - Continue lisinopril 40 mg daily and empagliflozin 10 mg daily as tolerated for proteinuria  - HOLD home OCP (ethinyl estradiol/drospirenone), given hypercoagulability associated with nephrotic syndrome per pharmacy recommendation.   [ ]  consider restarting bumex 1mg  BID if urine output decreases or swelling increases  [ ]  Consider rescheduling outpatient nephrology follow-up appointment pending rehab disposition (currently scheduled on 08/11/2020)  [ ]  plan to coordinate outpatient kidney biopsy with VIR prior to dischareg    Hydradenitis suppurativa - Psoriasis: Managed by dermatology outpatient; on IV Renflexis, finasteride, and clindamycin.??S/p Renflexis infusion 12/19  - Continue home clindamycin 300 mg BID and finasteride 5 mg daily    Vertigo/nausea: zyrtec 10 mg daily, compazine (first line), Zofran (second line), meclizine prn,   Hypothyroidism??- Continue home Synthroid 50 mcg every day + 25 mcg T/Th  HTN - Lisinopril and Bumex as above   HLD - Continue home Lipitor 20 mg daily, fenofibrate 48 mg daily, ASA 81 mg daily  Sleep Apnea - Does not tolerate CPAP at bedtime  GERD??- Continue home protonix 40 mg daily      DISPO: Admitted to Rehab  floor, patient will be discussed at next interdisciplinary team conference   EDD: pending  Follow up: PCP, neurology referral, nephrology, endocrinology referral     Estimated Length of Stay: 10-14 days  Anticipated Post-Rehab Destination / Needs:  home      Medical Necessity:  The patient requires acute inpatient rehabilitation to maximize functional independence and requires daily physician visits for monitoring/management of nephrotic syndrome, vital signs, anticoagulation, medications, skin and pain control. This patient's rehabilitation goals and medical complexity could not adequately be managed in a less intensive setting.  Potential risks for clinical complications include kidney failure, falls, adverse medication reactions, DVT/PE, pressure ulcers and worsening of underlying disease.  ??  Medical Prognosis: Good for continued progress and participation with therapy.  ??  Anticipated Interdisciplinary Rehabilitation Interventions: Activity tolerance: Patient is expected to tolerate minimum of 3 hrs of therapy daily @ 5x/week. Physical therapy to work on mobility. Occupational therapy to work on self care. Recreational therapy for community reintegration and relaxation training. Rehab Nursing to work on medication administration, patient/family education, skin care, fall prevention, bowel and bladder management, vital signs and feeding/nutrition. Weekly interdisciplinary team conference to assess progress and plan of care changes.  ??  Expected Functional Outcomes:  Expected level of improvement/goals for inpatient rehabilitation include modified independence for transfers, modified independence for ambulation and modified independent for self care (ADLs)    SUBJECTIVE:     Reason for Admission: Weakness    History of Present Illness: Summer Russell is a 45 y.o. female  with past medical history of uncontrolled type 2 diabetes, polyneuropathy, hypothyroidism, psoriasis complicated by arthritis, fibromyalgia, and sleep apnea admitted to [facility] 12/20 for severe hyperglycemia and lactic acidosis and nephrotic range proteinuria along with worsening left sided weakness, urinary retention and saddle anesthesia. MRI brain negative and suspect element of DM neuropathy.      Today, patient notes being worried about TOV given ongoing weakness and is. Catheter pulled about an hour ago, no current void yet.   Saddle anethesia overall resolved, ongoing BLE numbness.   Strength improving, left side still weaker.   Having bowel movements.     Pre-Morbid Level of Function:    Prior Function Comments: Pt reports that ~1 year ago she was independent with ADLs and active in the community. Around Oct of 2021 she injured her achilles and was placed on bedrest for 6 weeks, which became 6 months. She began PT after 6 months and recently had to stop 2/2 insurance, but does not feel that she has returned to her baseline functioning. She spends almost all of her time in her bedroom, leaving the house only for doctor's appointments. She endorses many close falls, but only one complete fall in the past year. She uses a w/c for functional mobility outside of the home but is able to furniture walk within her house. Pt's w/c does not fit on their ramp, therefore she walks up/down the ramp to enter/exit the house and uses the railings for stability. She enjoys spending time with her dogs and cats at home  ??  Pre-Admission/Current Level of Function:    ADLs: Needs assistance with ADLs  ??  ADLs - Needs Assistance: Feeding; Grooming; Bathing; Toileting; UB dressing; LB dressing  ??  Feeding - Needs Assistance: Set Up Assist; Performed at bed level  ??  Grooming - Needs Assistance: Performed at Bed level; Set Up Assist  ??  Bathing - Needs Assistance: Mod assist; Max assist  ??  Toileting - Needs Assistance: Mod assist; Max assist  ??  UB Dressing - Needs Assistance: Min assist; Performed at bed level  ??  LB Dressing - Needs Assistance: Mod assist; Max assist  ??  ??  Mobility:   Bed Mobility: supine to sit with SBA, sit to supine with CGA, increased time for progressing LEs  ??  Transfers: sit<>stand with CGA and RW x3 trials  ??  Gait: All performed with CGA and bariatric RW: side-stepping at EOB x2 ft, seated rest break, stepping in place x12 steps, seated rest break, stepping in place x12 steps  ??  ??  Cognition/Swallow/Speech:  Patient's Vision Adequate to Safely Complete Daily Activities: Yes  ??  Patient's Judgement Adequate to Safely Complete Daily Activities: Yes  ??  Patient's Memory Adequate to Safely Complete Daily Activities: Yes  ??  Patient Able to Express Needs/Desires: Yes  ??  Patient has speech problem: No    Precautions:  Safety Interventions  Safety Interventions: environmental modification, fall reduction program maintained, infection management, lighting adjusted for tasks/safety, low bed, muscle strengthening facilitated, nonskid shoes/slippers when out of bed, family at bedside  Infection Management: aseptic technique maintained    Medical / Surgical History: Reviewed  Past Medical History:   Diagnosis Date   ??? CKD (chronic kidney disease) 07/25/2021   ??? Diabetes mellitus (CMS-HCC)    ??? Disease of thyroid gland    ??? Fibromyalgia    ??? Hydradenitis    ??? Psoriasis    ??? Psoriatic arthritis (CMS-HCC)    ??? Sleep apnea        Past Surgical History:   Procedure Laterality Date   ??? IR INSERT PORT AGE GREATER THAN 5 YRS  11/06/2019    IR INSERT PORT AGE GREATER THAN 5 YRS 11/06/2019 Soledad Gerlach, MD [facility]   ??? SKIN BIOPSY     ??? TONSILLECTOMY         Social History: Reviewed  Social History     Socioeconomic History   ??? Marital status: Divorced     Spouse name: None   ??? Number of children: None   ??? Years of education: None   ??? Highest education level: None   Tobacco Use   ??? Smoking status: Former     Packs/day: 2.00     Years: 20.00     Pack years: 40.00     Types: Cigarettes     Quit date: 2013     Years since quitting: 9.9   ??? Smokeless tobacco: Never   Substance and Sexual Activity   ??? Alcohol use: No   ??? Drug use: No   Other Topics Concern   ??? Do you use sunscreen? Yes   ??? Tanning bed use? No   ??? Are you easily burned? No   ??? Excessive sun exposure? No   ??? Blistering sunburns? Yes     Social Determinants of Health     Financial Resource Strain: Low Risk ??? Difficulty of Paying Living Expenses: Not hard at all   Food Insecurity: No Food Insecurity   ??? Worried About Programme researcher, broadcasting/film/video in the Last Year: Never true   ??? Ran Out of Food in the Last Year: Never true   Transportation Needs: No Transportation Needs   ??? Lack of Transportation (Medical): No   ??? Lack of Transportation (Non-Medical): No       Family History:  Reviewed  family history includes Diabetes in her father and mother; Hypertension  in her father and mother. She was adopted.    Allergies: Reviewed  Venom-honey bee, Lamictal [lamotrigine], and Oxycodone    Medications: Reviewed  Current Facility-Administered Medications   Medication Dose Route Frequency Provider Last Rate Last Admin   ??? acetaminophen (TYLENOL) tablet 1,000 mg  1,000 mg Oral Q8H PRN Benjaman Kindler, MD   1,000 mg at 08/02/21 0757   ??? aspirin chewable tablet 81 mg  81 mg Oral Daily Benjaman Kindler, MD   81 mg at 08/02/21 1610   ??? atorvastatin (LIPITOR) tablet 20 mg  20 mg Oral Daily Benjaman Kindler, MD   20 mg at 08/02/21 9604   ??? cetirizine (ZyrTEC) tablet 10 mg  10 mg Oral Daily Benjaman Kindler, MD   10 mg at 08/02/21 5409   ??? cholecalciferol (vitamin D3 25 mcg (1,000 units)) tablet 25 mcg  25 mcg Oral Daily Benjaman Kindler, MD   25 mcg at 08/02/21 8119   ??? clindamycin (CLEOCIN) capsule 300 mg  300 mg Oral BID Benjaman Kindler, MD   300 mg at 08/02/21 1478   ??? dextrose (D10W) 10% bolus 125 mL  12.5 g Intravenous Q15 Min PRN Ocie Doyne, MD       ??? diclofenac sodium (VOLTAREN) 1 % gel 2 g  2 g Topical QID Benjaman Kindler, MD   2 g at 07/31/21 1249   ??? senna (SENOKOT) tablet 1 tablet  1 tablet Oral BID PRN Benjaman Kindler, MD   1 tablet at 08/01/21 2956    And   ??? docusate sodium (COLACE) capsule 100 mg  100 mg Oral BID PRN Benjaman Kindler, MD   100 mg at 08/01/21 2130   ??? empagliflozin (JARDIANCE) tablet 10 mg  10 mg Oral Daily Starleen Blue, MD   10 mg at 08/02/21 8657   ??? enoxaparin (LOVENOX) syringe 40 mg  40 mg Subcutaneous Q12H Benjaman Kindler, MD   40 mg at 08/02/21 0809   ??? fenofibrate (TRICOR) tablet 48 mg  48 mg Oral Daily Katrine Coho, MD   48 mg at 08/02/21 8469   ??? finasteride (PROSCAR) tablet 5 mg  5 mg Oral Daily Benjaman Kindler, MD   5 mg at 08/02/21 6295   ??? glucagon injection 1 mg  1 mg Intramuscular Once PRN Benjaman Kindler, MD       ??? glucose chewable tablet 16 g  16 g Oral Q10 Min PRN Benjaman Kindler, MD       ??? influenza vaccine quad (FLUARIX, FLULAVAL, FLUZONE) (6 MOS & UP) 2022-23  0.5 mL Intramuscular During hospitalization Jearld Pies, MD       ??? insulin glargine (LANTUS) injection 60 Units  60 Units Subcutaneous Nightly Derald Macleod, MD   60 Units at 08/01/21 2150   ??? insulin lispro (HumaLOG) injection 0-20 Units  0-20 Units Subcutaneous ACHS Benjaman Kindler, MD   4 Units at 08/02/21 1312   ??? insulin lispro (HumaLOG) injection 10 Units  10 Units Subcutaneous TID AC Altamese , MD       ??? levothyroxine (SYNTHROID) tablet 25 mcg  25 mcg Oral Once per day on Tue Thu Benjaman Kindler, MD   25 mcg at 08/01/21 2841   ??? levothyroxine (SYNTHROID) tablet 50 mcg  50 mcg Oral Daily Benjaman Kindler, MD   50 mcg at 08/02/21 3244   ??? lisinopriL (PRINIVIL,ZESTRIL) tablet 40 mg  40  mg Oral Daily Katrine Coho, MD   40 mg at 08/02/21 1610   ??? LORazepam (ATIVAN) injection 2 mg  2 mg Intravenous Daily PRN Derald Macleod, MD       ??? meclizine (ANTIVERT) tablet 25 mg  25 mg Oral TID PRN Starleen Blue, MD   25 mg at 07/28/21 0601   ??? nystatin (MYCOSTATIN) powder 1 application  1 application Topical BID Starleen Blue, MD   1 application at 08/02/21 0814   ??? ondansetron [facility]) injection 8 mg  8 mg Intravenous Q6H PRN Starleen Blue, MD   8 mg at 07/28/21 2308   ??? pantoprazole (PROTONIX) EC tablet 40 mg  40 mg Oral Daily Benjaman Kindler, MD   40 mg at 08/02/21 9604   ??? pneumoc 20-val conj-dip cr(PF) (PREVNAR-20) vaccine 0.5 mL  0.5 mL Intramuscular During hospitalization De-Vaughn Sima Matas, MD       ??? polyethylene glycol (MIRALAX) packet 17 g  17 g Oral Daily PRN Benjaman Kindler, MD       ??? prochlorperazine (COMPAZINE) injection 5 mg  5 mg Intravenous Q6H PRN Starleen Blue, MD   5 mg at 07/31/21 1248       Review of Systems:    Full 10 systems reviewed and negative, other than as noted in the HPI    OBJECTIVE:     Vitals:  Patient Vitals for the past 8 hrs:   BP Pulse Resp SpO2   08/02/21 0900 138/69 107 18 97 %       Physical Exam:  GEN: NAD, sitting up in bed comfortably, obesity, husband present during encounter.   EYES: EOMI, EOS, sclera anicteric  ENT: MMM, hearing intact,   CV: warm extremities, well perfused, no cyanosis, mild tachycardia, normal rhythm no mrg  PULM: Normal work of breathing on room air, CTA bilaterally; no wheezing rhonchi, rales  ABD: soft, non distended   Skin: no notable rashes, erythema, or drainage.   NEURO/MSK: CN grossly intact without deficit, BUE coordination on FTN intact, 5/5 strength BUE, decreased sensation to light touch bilateral feet, absent patellar/achilles reflexes bilaterally  RLE: 4 hip flexion, 5 knee extension, 4 dorsiflexion/plantar flexion  LLE: 4- hip flexion, 5 knee extension, 3+ dorsiflexion, 3- plantarflexion  PSYCH: A+Ox3, appropriate      Labs and Diagnostic Studies: Reviewed  CBC:   Recent Labs   Lab Units 08/01/21  0648 07/30/21  0555 07/29/21  0714   WBC 10*9/L 4.1 3.8 3.7   RBC 10*12/L 4.02 3.77* 3.96   HEMOGLOBIN g/dL 54.0 98.1 19.1   HEMATOCRIT % 36.0 33.7* 35.5   MCV fL 89.5 89.4 89.5   MCH pg 30.2 30.7 29.7   MCHC g/dL 47.8 29.5 62.1   RDW % 13.4 13.2 13.4   PLATELET COUNT (1) 10*9/L 265 221 251   MPV fL 8.8 8.5 8.8     CMP:   Recent Labs   Lab Units 08/01/21  0648 07/30/21  0555 07/29/21  0714 07/28/21  0607   SODIUM mmol/L 141 142 141 140   POTASSIUM mmol/L 3.6 3.8 3.7 3.7   CHLORIDE mmol/L 101 103 104 105   CO2 mmol/L 28.0 30.0 25.0 25.0   BUN mg/dL 16 17 14 13    CREATININE mg/dL 3.08* 6.57* 8.46* 9.62*   GLUCOSE mg/dL 952* 841* 324* 401   CALCIUM mg/dL 8.6* 8.5* 8.8 8.6*   MAGNESIUM mg/dL 1.8 1.7 2.0 2.0   PHOSPHORUS mg/dL  --  3.9 4.0 4.3   ALBUMIN g/dL  --  2.0* 2.3* 2.1*   PROTEIN TOTAL g/dL  --  5.5* 5.8 5.5*   BILIRUBIN TOTAL mg/dL  --  0.3 0.4 0.3   ALK PHOS U/L  --  64 64 57   ALT U/L  --  26 26 25    AST U/L  --  38* 39* 37*     Radiology Results: Reviewed      This patient is admitted to the Physical Medicine and Rehabilitation - Inpatient - A service from 8am-5pm on weekdays for questions regarding this patient. After hours and on weekends please contact the 1st call resident pager

## 2021-08-02 NOTE — Unmapped (Signed)
Patient A&Ox4. VSS on room air. No acute events overnight. No complaints of pain or discomfort. Husband bedside assisting with ADLs. Blood glucose monitored and insulin provided as ordered. Patient able to turn self in bed. Call bell and tray table within reach. Bed locked and in lowest position. Will continue to monitor.     Problem: Adult Inpatient Plan of Care  Goal: Plan of Care Review  Outcome: Ongoing - Unchanged  Goal: Patient-Specific Goal (Individualized)  Outcome: Ongoing - Unchanged  Flowsheets (Taken 08/02/2021 0425)  Patient-Specific Goals (Include Timeframe): Patient will remain free from falls or injuries this shift.  Individualized Care Needs: Monitor labs, VS, BG. Foley care. Falls precautions  Anxieties, Fears or Concerns: No concerns expressed  Goal: Absence of Hospital-Acquired Illness or Injury  Outcome: Ongoing - Unchanged  Intervention: Identify and Manage Fall Risk  Recent Flowsheet Documentation  Taken 08/01/2021 2000 by Jodi Mourning, RN  Safety Interventions:  ??? environmental modification  ??? lighting adjusted for tasks/safety  ??? low bed  ??? family at bedside  ??? fall reduction program maintained  ??? nonskid shoes/slippers when out of bed  Intervention: Prevent Skin Injury  Recent Flowsheet Documentation  Taken 08/02/2021 0425 by Jodi Mourning, RN  Skin Protection: adhesive use limited  Intervention: Prevent and Manage VTE (Venous Thromboembolism) Risk  Recent Flowsheet Documentation  Taken 08/02/2021 0425 by Jodi Mourning, RN  Activity Management: activity adjusted per tolerance  Intervention: Prevent Infection  Recent Flowsheet Documentation  Taken 08/01/2021 2000 by Jodi Mourning, RN  Infection Prevention:  ??? environmental surveillance performed  ??? personal protective equipment utilized  ??? single patient room provided  ??? rest/sleep promoted  Goal: Optimal Comfort and Wellbeing  Outcome: Ongoing - Unchanged  Intervention: Monitor Pain and Promote Comfort  Recent Flowsheet Documentation  Taken 08/02/2021 0425 by Jodi Mourning, RN  Pain Management Interventions: medication offered  Goal: Readiness for Transition of Care  Outcome: Ongoing - Unchanged  Goal: Rounds/Family Conference  Outcome: Ongoing - Unchanged     Problem: Impaired Wound Healing  Goal: Optimal Wound Healing  Outcome: Ongoing - Unchanged  Intervention: Promote Wound Healing  Flowsheets (Taken 08/02/2021 0425)  Activity Management: activity adjusted per tolerance  Pain Management Interventions: medication offered     Problem: Self-Care Deficit  Goal: Improved Ability to Complete Activities of Daily Living  Outcome: Ongoing - Unchanged  Intervention: Promote Activity and Functional Independence  Flowsheets (Taken 08/02/2021 0425)  Activity Assistance Provided: assistance, 2 people     Problem: Skin Injury Risk Increased  Goal: Skin Health and Integrity  Outcome: Ongoing - Unchanged  Intervention: Optimize Skin Protection  Flowsheets (Taken 08/02/2021 0425)  Pressure Reduction Techniques:  ??? frequent weight shift encouraged  ??? weight shift assistance provided  Pressure Reduction Devices: pressure-redistributing mattress utilized  Skin Protection: adhesive use limited     Problem: Infection  Goal: Absence of Infection Signs and Symptoms  Outcome: Ongoing - Unchanged  Intervention: Prevent or Manage Infection  Flowsheets  Taken 08/02/2021 0425  Infection Management: aseptic technique maintained  Taken 08/01/2021 2000  Infection Management: aseptic technique maintained     Problem: Pain Acute  Goal: Acceptable Pain Control and Functional Ability  Outcome: Ongoing - Unchanged  Intervention: Develop Pain Management Plan  Flowsheets (Taken 08/02/2021 0425)  Pain Management Interventions: medication offered  Intervention: Prevent or Manage Pain  Flowsheets (Taken 08/02/2021 0425)  Medication Review/Management: medications reviewed     Problem: Hypertension Acute  Goal: Blood Pressure Within Desired Range  Outcome: Ongoing - Unchanged  Intervention: Normalize Blood Pressure  Flowsheets (Taken 08/02/2021 0425)  Medication Review/Management: medications reviewed

## 2021-08-02 NOTE — Unmapped (Signed)
This patient was not seen in person and the consultation was conducted over the phone or virtually via WebEx or Mychart Bedside. The Diabetes Education Team Consult Service has moved to a hybrid model to minimize potential spread of COVID-19 and protect patients/providers. During this time, we will be limiting person-to-person contact when possible.       Diabetes education consultation: Consulted for assistance with providing instruction on diabetes self-management skills. Visit with Summer Russell via phone.  Provided 60 minutes of diabetes education.     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 Summer Russell has Diabetes Mellitus Type 2- Insuling requiring  see MAR for medication list. Summer Russell expressed many barriers to self-managing diabetes due to other comorbidities limiting her physical mobility. Summer Russell lives with her husband who is also her caregiver. Her husband works night shift from 3 PM- 1AM, which leaves her without assistance during that time period. Summer Russell reports that she sleeps mostly while her spouse is at work and is unable to [employer]. Therefore, Summer Russell expressed that she mostly snacks on processed foods such as chips and popcorn while her husband is at work. Summer Russell was receptive to diabetes education.      A1C:    Lab Results   Component Value Date    A1C 10.9 (H) 07/25/2021       Insulin administration & safety:   Summer Russell reports that she forgets to take her mealtime insulin. Summer Russell also expressed that she has blurred vision and cannot always see how to set insulin dose on pen. Summer Russell has her husband verify dosage when he is home, but he works from 3 PM - 1 AM. Summer Russell has prescription glasses, but states they don't always work. 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     Discussed current home, explaining long-acting vs rapid acting action, timing of administration &  connection to glucose monitoring. Discussed the importance of not skipping doses.     Possible solutions to overcome barriers:  Advertising copywriter (Diabetes Nurse educator will provide and assess)  - Have husband prepare insulin pen prior to going to work (Diabetes Nurse Educator will provide teaching to spouse)  - OT provide hand grip strengthening exercises to promote independence  - HH aide to assist when husband is at work      Problem-solving:   Hypoglycemia:    Instructed on hypoglycemia recognition & treatment, including 15-15 rule, listing several examples of appropriate fast carb sources, emphasizing importance of having something readily available. Described possible causes and prevention.  Instructed to contact provider for unexplained episode, an episode requiring more than 1 treatment, or 2 episodes within a 2-3 day period, for possible dose adjustment.    Provided printed material: Hypoglycemia in Diabetes as a resource.  Hyperglycemia:  Instructed on hyperglycemia recognition and treatment. Described possible causes and prevention and when to contact provider.   Provided printed material: Hyperglycemia  And Diabetes: Blood Sugar Emergencies as resources      Risk Reduction- Foot Care:  Discussed principles of good foot care such as daily cleansing, and inspection, avoiding walking barefoot, having well-fitting shoes and no bathroom surgery  on feet.  Provided education material Diabetes Foot Health: Care Instructions as a resource.      Being Active:  Discussed the impact of physical activity on blood sugars, encouraged to engage in physical activity after meals, examples provided.     Healthy Eating:  Summer Russell reports that her diet usually consists of processed foods. Mealtimes are at 1 AM (Breakfast), 1 PM (Dinner), and snacks between 3 PM- 1AM d/t husband's work schedule. Spouse works in [employer]; therefore, meals consists of leftovers such as chili and/or sandwiches. Summer Russell expressed that she has food benefits, but it's not enough to cover healthy meal options. Summer Russell Summer Russell drinks water and diet sodas. Summer Russell also has IBS and reports being nauseous often. Summer Russell reports following the [employer] when symptomatic.    Discussed with patient meal planning principles for glucose control including the plate method, which foods are carbs and portion control.  Reviewed dietary guidelines/ plate method/ avoiding concentrated sweets/eating at consistent times.   Meal Planning Basics  Foods that Affect and Do Not Affect Blood Glucose  Timing, Amount, and Balance in Meals  Food Labels  Carbohydrate Counting   Low Carbohydrate Snacks    Discussed budget friendly meal and snack options.     Provided Summer Russell a copy of the Consistent Carbohydrate & Pediatric Consistent Carbohydrate guide to use while in house. Provided ADA Diabetes Food Plate handout as a resource.       Insurance: Summer Russell has Medicaid. There are not financial barriers to glucose monitoring.     Monitoring:   Summer Russell reports that she has a blood glucose meter and supplies at home. However, Summer Russell reports only checking her blood glucose when she doesn't feel well. Summer Russell Summer Russell expressed that finger sticks are difficult and painful  d/t her arthritis. Summer Russell attempted to get approval for CGM in past, but was denied.     Diabetes nurse educator will provide Summer Russell with Freestyle Libre 2 sample at discharge. Freestyle Libre 2 app was successfully downloaded and is compatible to patient's phone.     Discussed importance of checking blood glucose. Instructed to check blood glucose at least 4 times/day prior to insulin administration. Discussed working with PCP or Endo for prior authorization post discharge. Provided with copies of large print log sheets to maintain a record of readings & instructed to bring to follow-up visits with provider for evaluation of treatment plan.    Diabetes Resources: Provided, Adult Diabetes Booklet as resource for discharge.      Recommendations:   - Consult Nutrition Services for low sodium and low carb meal options that reduce IBS symptoms.  - Consult social work for meal assistance programs and in-home care programs  -At f/u visit with PCP request referral for DSMES (Diabetes Self Management Education and Support) after discharge.     Plan: Will continue to follow while in house.    Thank You,   Thomas Hoff, MSN, RN, Diabetes Nurse Educator- pager: [phone]

## 2021-08-02 NOTE — Unmapped (Signed)
Physician Discharge Summary [facility]  [address] [facility]  [address]  [place] [address]  Dept: [phone]  Loc: [phone]     Identifying Information:   Summer Russell  12/13/1975  [id number]    Primary Care Physician: Alonna Minium     Code Status: Full Code    Admit Date: 07/24/2021    Discharge Date: 08/02/2021     Discharge To: Acute Inpatient Rehab    Discharge Service: [facility] - General Medicine Floor Team (MEDW)     Discharge Attending Physician: Jearld Pies, MD    Discharge Diagnoses:   Principal Problem:    Lactic acidosis POA: Yes  Active Problems:    Hypertension POA: Unknown    Hyperglycemia due to type 2 diabetes mellitus (CMS-HCC) POA: Yes    Proteinuria POA: Yes    CKD (chronic kidney disease) POA: Yes    Hyponatremia POA: Yes    Axillary hidradenitis suppurativa POA: Yes    Psoriasis POA: Yes    Hypothyroidism POA: Yes    Hyperlipidemia POA: Yes  Resolved Problems:    * No resolved hospital problems. *      Hospital Course:   Summer Russell is a 45 y.o. 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     She initially presented to the ER for weakness soon after outpatient infusion of IV Renflexis. Her lactate was initially elevated, peaking at 5.8, and she was also found to have diffuse edema and nephrotic range proteinuria. She was admitted to Medicine for further work-up, with hospital course detailed by problem as below.     Weakness - Left leg sensory deficits - Urinary retention - Back pain - Diabetic neuropathy  She was markedly weak in setting of months of deconditioning, also reporting back pain, difficulty urinating, and decreased sensation (beyond usual diabetic neuropathy) over left leg. A Foley was placed for urinary retention with >1L UOP. PT/OT and PM&R were consulted, with recommendation to pursue neurological imaging. Unfortunately, she could not tolerate spinal MRI, so only brain MRI was completed, showing no acute concerns. As she diuresed, and edema improved, so did her neurologic symptoms. Suspect that symptoms may be secondary to peripheral neuropathy due to volume overload in setting of nephrotic syndrome. Foley was removed on day of discharge, and she subsequently passed her trial of void. Will discharge to AIR.    Nephrotic range proteinuria - AKI  Initial Cr 1.49, baseline 0.8-1.0. Consulted Nephrology for nephrotic range proteinuria, with associated hypertriglyceridemia, hypo-albuminuria, and marked b/l edema. She was diuresed with marked improvement in edema, and she was also continued on ACE-I. Initial AKI resolved, with creatinine of 0.91 on discharge. Additional autoimmune and infectious work-up unremarkable. 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 She was followed with Nephrology, with plan for outpatient renal biopsy with VIR.    Elevated lactate, resolved  Lactate peaked at 5.8, then downtrended. Initially treated with broad spectrum antibiotics, but no clear infectious source. Blood cultures NGTD and with skin exam consistent with intertrigo but no clear signs of cellulitis.    Hyperglycemia - Uncontrolled T2DM with insulin dependence  BG elevated to >400 on admission (no AG or acidemia to suggest DKA). Held metformin for elevated lactate and started her on Jardiance. Also titrated long-acting insulin upwards, to 60U lantus with 10 U TID AC of nutritional lispro  on discharge. Plan to follow up with [facility] as outpatient.    Intertrigo  Exam consistent with intertrigo under pannus; no clear cellulitic component. Treated with nystatin powder.     Hydradenitis suppurativa - Psoriasis   Managed by dermatology outpatient; on IV Renflexis, finasteride, and clindamycin. Continued clindamycin and finasteride while  inpatient.     Chronic issues:  Hypothyroidism - Continued home Synthroid 50 mcg every day + 25 mcg T/Th.   HTN - Lisinopril and Bumex as above   HLD - Continued home Lipitor  Sleep Apnea - Did not tolerate CPAP at bedtime  GERD - Continued home PPI  Contraception - Held home OCP due to pro-thrombotic state of nephrotic syndrome.         Outpatient Provider Follow Up Issues:   - PCP: consider OCP alternatives    Touchbase with Outpatient Provider:  Warm Handoff: Completed on 08/02/21 by Jaclynn Major  (Resident) via Phone    Procedures:  None  ______________________________________________________________________  Discharge Medications:      Your Medication List      STOP taking these medications    furosemide 40 MG tablet  Commonly known as: LASIX     meloxicam 15 MG tablet  Commonly known as: MOBIC     metFORMIN 500 MG tablet  Commonly known as: GLUCOPHAGE     NIKKI (28) 3-0.02 mg per tablet  Generic drug: drospirenone-ethinyl estradioL        START taking these medications    empagliflozin 10 mg tablet  Commonly known as: JARDIANCE  Take 1 tablet (10 mg total) by mouth daily.  Start taking on: August 03, 2021     fenofibrate 48 MG tablet  Commonly known as: TRICOR  Take 1 tablet (48 mg total) by mouth daily.  Start taking on: August 03, 2021     insulin glargine 100 unit/mL injection  Commonly known as: LANTUS  Inject 0.6 mL (60 Units total) under the skin nightly.     insulin lispro 100 unit/mL injection  Commonly known as: HumaLOG  Inject 0.1 mL (10 Units total) under the skin Three (3) times a day before meals.        CHANGE how you take these medications    lisinopriL 40 MG tablet  Commonly known as: PRINIVIL,ZESTRIL  Take 1 tablet (40 mg total) by mouth daily.  Start taking on: August 03, 2021  What changed:   ?? medication strength  ?? how much to take        CONTINUE taking these medications    ascorbic Acid 500 mg Cper  Commonly known as: VITAMIN C  Take 500 mg by mouth.     aspirin 81 MG tablet  Commonly known as: ECOTRIN  Take 81 mg by mouth daily.     atorvastatin 20 MG tablet  Commonly known as: LIPITOR  Take 20 mg by mouth daily.     BD INSULIN SYRINGE ULTRA-FINE 1 mL 31 gauge x 5/16 (8 mm) Syrg  Generic drug: insulin syringe-needle U-100  USE 2 (TWO) TIMES DAILY AS DIRECTED     blood sugar diagnostic Strp  Frequency:PHARMDIR   Dosage:0.0     Instructions:  Note:Use to test blood sugar in the morning before breakfast and in the evening two hours after dinner, ISD-9 250.00. Dose: 1     blood-glucose meter Misc  U QID UTD     cetirizine 10 MG tablet  Commonly known as: ZyrTEC  Take 10 mg by mouth daily.  cholecalciferol (vitamin D3 25 mcg (1,000 units)) 1,000 unit (25 mcg) tablet  Take 1,000 Units by mouth.     clindamycin 300 MG capsule  Commonly known as: CLEOCIN  TAKE 1 CAPSULE BY MOUTH TWO TIMES A DAY.     COSENTYX PEN (2 PENS) 150 mg/mL Pnij injection  Generic drug: secukinumab  Inject the contents of 2 pens (300 mg total) under the skin every fourteen (14) days as maintenance doses.  START 14 days after last loading dose.     empty container Misc  Use as directed to dispose of Cosentyx pens.     finasteride 5 mg tablet  Commonly known as: PROSCAR  TAKE 1 TABLET BY MOUTH EVERY DAY     fluconazole 150 MG tablet  Commonly known as: DIFLUCAN  TAKE 1 TAB NOW AN THEN TAKE 1 TABLET ONCE WEEKLY AS NEEDED FOR YEAST INFECTION     gabapentin 300 MG capsule  Commonly known as: NEURONTIN  Take 1 capsule (300 mg total) by mouth Two (2) times a day.     levothyroxine 50 MCG tablet  Commonly known as: SYNTHROID  Take by mouth. Taking on Monday Wednesday Friday, Saturday & Sunday and on Tuesday & Thursday     lidocaine 5 % ointment  Commonly known as: XYLOCAINE  Apply to painful areas every 3-4 hours as needed     multivitamin capsule  Take 1 capsule by mouth daily.     pantoprazole 40 MG tablet  Commonly known as: PROTONIX  Take 40 mg by mouth.     pen needle, diabetic 31 gauge x 5/16 (8 mm) Ndle  Use 1 needle to skin four times daily as directed            Allergies:  Venom-honey bee, Lamictal [lamotrigine], and Oxycodone  ______________________________________________________________________  Pending Test Results:  Pending Labs     Order Current Status    Primary Membranous Nephropathy Cascade In process    Primary Membranous Nephropathy Cascade Preliminary result          Most Recent Labs:  All lab results last 24 hours -   Recent Results (from the past 24 hour(s))   POCT Glucose    Collection Time: 08/01/21  7:21 PM   Result Value Ref Range    Glucose, POC 248 (H) 70 - 179 mg/dL   POCT Glucose    Collection Time: 08/01/21  9:42 PM   Result Value Ref Range    Glucose, POC 302 (H) 70 - 179 mg/dL   POCT Glucose    Collection Time: 08/02/21  1:35 AM   Result Value Ref Range    Glucose, POC 191 (H) 70 - 179 mg/dL   POCT Glucose    Collection Time: 08/02/21  7:46 AM   Result Value Ref Range    Glucose, POC 206 (H) 70 - 179 mg/dL   POCT Glucose    Collection Time: 08/02/21 12:19 PM   Result Value Ref Range    Glucose, POC 215 (H) 70 - 179 mg/dL   POCT Glucose    Collection Time: 08/02/21  5:32 PM   Result Value Ref Range    Glucose, POC 242 (H) 70 - 179 mg/dL       Relevant Studies/Radiology:  ECG 12 Lead    Result Date: 07/25/2021  SINUS TACHYCARDIA CANNOT RULE OUT ANTERIOR INFARCT  (CITED ON OR BEFORE 09-Oct-2017) ABNORMAL ECG WHEN COMPARED WITH ECG OF 09-Oct-2017 00:05, T WAVE INVERSION NOW EVIDENT IN LATERAL  LEADS Confirmed by Mariane Baumgarten [phone]) on 07/25/2021 5:34:03 PM    XR Chest Portable    Result Date: 07/24/2021  EXAM: XR CHEST PORTABLE DATE: 07/24/2021 8:22 PM ACCESSION: [id number] UN DICTATED: 07/24/2021 8:32 PM INTERPRETATION LOCATION: [facility] CLINICAL INDICATION: 45 year old female with dyspnea.  COMPARISON: October 08, 2017. TECHNIQUE: Portable Chest Radiograph. FINDINGS: Right IJ Port-A-Cath with tip terminating at the cavoatrial junction. No focal consolidation, pleural fluid, or pneumothorax.     No acute findings.    MRI Brain Wo Contrast    Result Date: 07/28/2021  EXAM: Magnetic resonance imaging, brain, without contrast material. DATE: 07/28/2021 6:14 PM ACCESSION: [id number] UN DICTATED: 07/28/2021 6:59 PM INTERPRETATION LOCATION: [facility] CLINICAL INDICATION: 45 years old Female with Left lower leg sensory deficits, generalized weakness, and urinary retention. PM&R requesting MRI brain, cervical, thoracic, and lumbar spine  COMPARISON: None TECHNIQUE: Multiplanar, multisequence MR imaging of the brain was performed without I.V. contrast. FINDINGS:  There are scattered foci of signal abnormality within the periventricular and deep white matter.  These are nonspecific.  Ventricles are normal in size. There is no midline shift. No extra-axial fluid collection. No evidence of intracranial hemorrhage. No diffusion weighted signal abnormality to suggest acute infarct. No mass.     A few scattered foci of signal abnormality in the white matter are nonspecific. No acute intracranial pathology.    CT Abdomen Pelvis W Contrast    Result Date: 07/25/2021  EXAM: CT ABDOMEN PELVIS W CONTRAST DATE: 07/25/2021 11:38 AM ACCESSION: [id number] UN DICTATED: 07/25/2021 11:50 AM INTERPRETATION LOCATION: [facility] CLINICAL INDICATION: 45 years old with concern for pancreatitis.  COMPARISON: CTA chest 07/24/2021 TECHNIQUE: A helical CT scan of the abdomen and pelvis was obtained following IV contrast from the lung bases through the pubic symphysis. Images were reconstructed in the axial plane. Coronal and sagittal reformatted images were also provided for further evaluation. FINDINGS: LOWER CHEST: Partially imaged central venous catheter with tip terminating in the right atrium. The visualized lung fields are clear. No pericardial effusion. LIVER: Normal liver contour.  No focal liver lesions. BILIARY: No biliary ductal dilatation.  The gallbladder is normal in appearance. SPLEEN: Normal in size and contour. PANCREAS: Normal pancreatic contour.  No focal lesions.  No ductal dilation. No peripancreatic fluid collection or and surrounding inflammatory stranding. ADRENAL GLANDS: The right adrenal gland is unremarkable. 0.9 cm hypoattenuating nodule at the left adrenal gland (4:23). KIDNEYS/URETERS: Symmetric renal enhancement.  No hydronephrosis. No enhancing renal masses. BLADDER: Unremarkable. REPRODUCTIVE ORGANS: Unremarkable. GI TRACT: No findings of bowel obstruction or acute inflammation.  Normal appendix. Scattered colonic diverticulum without evidence of diverticulitis. PERITONEUM, RETROPERITONEUM AND MESENTERY: No free air.  No ascites.  No fluid collection. LYMPH NODES: Scattered prominent and mildly enlarged lymph nodes, representative nodes as follows: -1 cm portacaval lymph node (4:43). -1.1 cm periaortic node (4:62). -1 cm right pelvic sidewall lymph node (4:128). -Bilateral 1 cm external iliac chain lymph nodes (4:133, 138). These are indeterminate but could be reactive. VESSELS: Normal caliber aorta. Portal venous system is patent. The hepatic veins are patent. The IVC is unremarkable. BONES and SOFT TISSUES: No aggressive osseous lesions. Dominant Soft tissue edema/stranding within the anterior lower abdominal wall pannus with associated anterior skin thickening. No focal soft tissue lesions.     --No evidence of acute pancreatitis is clinically questioned. --Diffuse soft tissue edema, fatty stranding, and associated skin thickening at the anterior inferior abdominal wall pannus. Findings may reflect edema versus cellulitis. Recommend clinical correlation. --Scattered  mildly enlarged lymph nodes throughout the retroperitoneum and pelvis, findings are nonspecific and may be reactive in nature. Recommend clinical correlation. --Subcentimeter hypoattenuating nodule at the left adrenal gland, given size findings are likely benign. Recommend attention on follow-up imaging. --Additional chronic and incidental findings, as above.    ED POCUS Chest Bilateral    Result Date: 07/24/2021  Limited Cardiac Ultrasound (CPT [phone]) Indication: A focused ultrasound exam of the heart was performed to evaluate for pericardial effusion, tamponade, severe hypovolemia, or gross abnormalities of cardiac anatomy or function in this patient. The ultrasound was performed with the following indications, as noted in the H&P: Dyspnea, Concern for effusion, and Other indications as noted in the H&P Identified structures: The pericardial sac, myocardium, and 4 chambers were identified using the following views: parasternal long axis and parasternal short axis Findings: Exam of the above structures revealed the following findings:  Pericardial effusion: Absent   Pericardial tamponade: N/A  Global LV function: Normal  Right ventricular size: Normal   Signs of RV strain: N/A  IVC: Normal  Other findings: Large anterior fat pad Limitations: None. Impression: Normal cardiac ultrasound No sonographic evidence of significant cardiac dysfunction and No sonographic evidence of significant pericardial effusion Other: None Interpreted by: Raechel Ache, MD Quality Assurance  After review of the point-of-care ultrasound performed in this case I assess the overall image quality as: Image quality: Minimal criteria met for diagnosis, recognizable structures but with some technical or other flaws  The accuracy of interpretation of images as presented reflects a true negative. This study does meet minimum criteria for credentialing and billing. Erle Crocker, MD     Echocardiogram W Colorflow Spectral Doppler With Contrast    Result Date: 07/25/2021  Patient Info Name:     MAUI AHART [name] Age:     45 years DOB:     1976/02/19 Gender:     Female MRN:     [id number] Accession #:     [id number] UN Ht:     168 cm Wt:     170 kg BSA:     2.93 m2 Technical Quality:     Poor Exam Date:     07/25/2021 2:00 PM Site Location:     [facility] Exam Location:     [facility] Admit Date:     07/24/2021 Exam Type:     ECHOCARDIOGRAM W COLORFLOW SPECTRAL DOPPLER WITH CONTRAST Study Info Indications      - cardiomegaly,  establish EF Complete two-dimensional, color flow and Doppler transthoracic echocardiogram is performed with contrast to opacify the left ventricle and to improve the delineation of the left ventricle endocardial borders. Staff Referring Physician:     [id number], [email or web address]; Reading Fellow:     Malka So MD Sonographer:     Redmond Pulling Ordering Physician:     Benjaman Kindler Account #:     [id number] Ultrasound Enhancing Agent/Agitated Saline ------------------------------ UEA/Ag. Saline:     Lumason Amount:     --- ml Reason for Poor Study:     poor echocardiographic windows Summary   1. The left ventricle is normal in size with mildly increased wall thickness.   2. The left ventricular systolic function is normal, LVEF is visually estimated at > 55%.   3. Mitral annular calcification is present (mild).   4. The mitral valve leaflets are mildly thickened with normal leaflet mobility.   5. The right ventricle is not well visualized but probably normal in size, with normal systolic function.  6. Technically difficult study.   7. An ultrasound enhancing agent was used to improve the visualization of the left ventricular cavity and endocardial borders. Left Ventricle   The left ventricle is normal in size with mildly increased wall thickness.   The left ventricular systolic function is normal, LVEF is visually estimated at > 55%.   Left ventricular diastolic function cannot be accurately assessed. Right Ventricle   Right ventricle is not well visualized.   The right ventricle is not well visualized but probably normal in size, with normal systolic function. Left Atrium   The left atrium is normal in size. Right Atrium   The right atrium is normal  in size. Aortic Valve   The aortic valve is trileaflet with normal appearing leaflets with normal excursion.   There is no significant aortic regurgitation.   There is no evidence of a significant transvalvular gradient. Pulmonic Valve   The pulmonic valve is poorly visualized, but probably normal.   There is no significant pulmonic regurgitation.   There is no evidence of a significant transvalvular gradient. Mitral Valve   Mitral annular calcification is present (mild).   The mitral valve leaflets are mildly thickened with normal leaflet mobility.   There is trivial mitral valve regurgitation. Tricuspid Valve   The tricuspid valve leaflets are poorly visualized but probably normal, with normal leaflet mobility.   There is trivial tricuspid regurgitation.   The pulmonary systolic pressure cannot be estimated due to insufficient TR signal. Pericardium/Pleural   There is no pericardial effusion. Inferior Vena Cava   IVC size and inspiratory change suggest normal right atrial pressure. (0-5 mmHg). Aorta   The aorta is normal in size in the visualized segments. Pulmonic Valve ---------------------------------------------------------------------- Name                                 Value        Normal ---------------------------------------------------------------------- PV Doppler ---------------------------------------------------------------------- PV Peak Velocity                   1.3 m/s Tricuspid Valve ---------------------------------------------------------------------- Name                                 Value        Normal ---------------------------------------------------------------------- Estimated PAP/RSVP ---------------------------------------------------------------------- RA Pressure                         3 mmHg           <=5 Aorta ---------------------------------------------------------------------- Name                                 Value        Normal ---------------------------------------------------------------------- Ascending Aorta ---------------------------------------------------------------------- Ao Root Diameter (2D)               3.2 cm               Ao Root Diam Index (2D)         10.9 cm/m2 Venous ---------------------------------------------------------------------- Name                                 Value        Normal ---------------------------------------------------------------------- IVC/SVC ---------------------------------------------------------------------- IVC Diameter (Exp  2D)               1.4 cm         <=2.1 Aortic Valve ---------------------------------------------------------------------- Name                                 Value        Normal ---------------------------------------------------------------------- AV Doppler ---------------------------------------------------------------------- AV Peak Velocity                   1.3 m/s Ventricles ---------------------------------------------------------------------- Name                                 Value        Normal ---------------------------------------------------------------------- LV Dimensions 2D/MM ---------------------------------------------------------------------- IVS Diastolic Thickness (2D)        1.3 cm       0.6-0.9 LVID Diastole (2D)                  3.7 cm       3.8-5.2 LVPW Diastolic Thickness (2D)                                1.3 cm       0.6-0.9 LVID Systole (2D)                   2.9 cm       2.2-3.5 Atria ---------------------------------------------------------------------- Name                                 Value        Normal ---------------------------------------------------------------------- LA Dimensions ---------------------------------------------------------------------- LA Dimension (2D)                   3.3 cm       2.7-3.8 Report Signatures Finalized by Andrey Campanile  MD on 07/25/2021 06:37 PM Resident Jesse Sans Daubert  MD on 07/25/2021 03:37 PM    PVL Venous Duplex Lower Extremity Bilateral    Result Date: 07/25/2021   Peripheral Vascular Lab     [address]   [place], [place] [address]  PVL VENOUS DUPLEX LOWER EXTREMITY BILATERAL Patient Demographics Pt. Name: PRICILLA MOEHLE [name] Location: Emergency Department MRN:      [id number]             Sex:      F DOB:      1975-08-29            Age:      36 years  Study Information Authorizing         [id number] Rosiland Oz      Performed Time       07/24/2021 Provider Name       GAFFNEY                                   6:53:48 PM Ordering Physician  Raechel Ache     Patient Location     [facility] Accession Number    [id number] [facility]        Technologist         Eli Phillips  RVT Diagnosis:                               Assisting            Nanda Quinton,                                          Technologist         RVT Ordered Reason For Exam: concern for DVT Indication: SOB, weakness Risk Factors: Limited mobility and (obesity). Protocol The major deep veins from the inguinal ligament to the ankle are assessed for bilaterally for compressibility and color and spectral Doppler flow characteristics. The assessed veins include bilateral common femoral vein, femoral vein in the thigh, popliteal vein, and intramuscular calf veins. The iliac vein is assessed indirectly using Doppler waveform analysis. The great saphenous vein is assessed for compressibility at the saphenofemoral junction, and the small saphenous vein assessed for compressibility behind the knee. Limitations: Body habitus and poor ultrasound/tissue interface.  Right Duplex Findings All veins visualized appear fully compressible. Doppler flow signals demonstrate normal spontaneity, phasicity, and augmentation. Not visualized segments included the proximal calf veins due to the noted limitations.  Left Duplex Findings All veins visualized appear fully compressible. Doppler flow signals demonstrate normal spontaneity, phasicity, and augmentation. Not visualized segments included the proximal calf veins due to the noted limitations. Unable to demonstrate compression at the left common femoral vein due to body habitus/anatomy; Doppler waveforms at this segment are WNL.  Right Technical Summary No evidence of deep venous obstruction in the lower extremity. No indirect evidence of obstruction proximal to the inguinal ligament. Not visualized segments included the proximal calf veins due to the noted limitations.  Left Technical Summary No evidence of deep venous obstruction in the lower extremity. No indirect evidence of obstruction proximal to the inguinal ligament. Not visualized segments included the proximal calf veins due to the noted limitations.  Final Interpretation Right There is no evidence of DVT in the lower extremity. However, portions of this examination were limited- see technologist comments above. There is no evidence of obstruction proximal to the inguinal ligament or in the common femoral vein. Left There is no evidence of DVT in the lower extremity. However, portions of this examination were limited- see technologist comments above. There is no evidence of obstruction proximal to the inguinal ligament or in the common femoral vein.  Electronically signed by [id number] Jodell Cipro MD on 07/25/2021 at 6:42:25 AM.  * Final *    CTA Chest W Contrast    Result Date: 07/25/2021  EXAM: CTA Chest for Pulmonary Embolus DATE: 07/24/2021 11:52 PM ACCESSION: [id number] UN DICTATED: 07/25/2021 1:44 AM INTERPRETATION LOCATION: [facility] CLINICAL INDICATION: 45 years old Female with Tachycardia + dyspnea, concern for PE  COMPARISON: Chest radiograph 07/24/2021 TECHNIQUE: Contiguous 1 and 2 mm axial images were reconstructed through the chest following a single breath hold helical acquisition.  Images were reformatted in the sagittal and coronal planes.  Multiplanar MIP slabs were created for more thorough evaluation of the pulmonary vasculature. Intravenous contrast was administered. The examination was performed with limited z-axis coverage and reduced kVp to improve vascular opacification and reduce breath hold and contrast and radiation dose. kVP:  100 DLP:  437.9 FINDINGS: PULMONARY ARTERIES: No emboli in either lung.  The right heart is not dilated. LUNGS  AND AIRWAYS: The lungs are clear.. The central airways are patent. PLEURA: No pleural fluid or pneumothorax., MEDIASTINUM AND LYMPH NODES: No enlarged intrathoracic or axillary lymph nodes are present.    No other mediastinal abnormalities are present. HEART AND VASCULATURE: Cardiomegaly. Ascending and descending aorta normal in caliber.    There is no pericardial effusion. Right IJ chest wall Port-A-Cath with tip terminating in the right atrium. Coronary calcifications. BONES AND SOFT TISSUES: No significant abnormalities. UPPER ABDOMEN: Mild hepatosplenomegaly, otherwise unremarkable.     No emboli and no acute findings. Coronary calcifications.     ______________________________________________________________________  Discharge Instructions:   Activity Instructions     Activity as tolerated          You were admitted to the hospital for lower extremity weakness and numbness and acute kidney injury. After optimization of your medications, including diuretics, your swelling improved which subsequently improved your neurologic symptoms.We suspect that your kidney injury and proteinuria are a result of your diabetes for which we optimized your medication and insulin regimen. If you experience return of symptoms please call your doctor. If you experience chest pain, shortness of breath or any other symptoms that is worrisome to you, please got hte ER for further evaluation.      Follow Up instructions and Outpatient Referrals     Ambulatory referral to Endocrinology      Call MD for:  difficulty breathing, headache or visual disturbances      Call MD for:  extreme fatigue      Call MD for:  persistent dizziness or light-headedness      Call MD for:  persistent nausea or vomiting      Call MD for: redness, tenderness, or signs of infection (pain, swelling,   redness, odor or green/yellow discharge around incision site)      Call MD for:  severe uncontrolled pain      Discharge instructions      Discharge instructions          Appointments which have been scheduled for you    Aug 03, 2021  8:00 AM  OT EVAL with Vonzell Schlatter, OT  [facility] OCCUPATIONAL THERAPY [facility] REGION) [address]  [place] [address]      Aug 03, 2021 10:00 AM  OT TREATMENT with Lennox Laity, OTA  [facility] OCCUPATIONAL THERAPY [facility] REGION) [address]  [place] [address]      Aug 03, 2021  2:00 PM  PT EVAL with Felipa Evener, PT  [facility] PHYSICAL THERAPY [facility] REGION) [address]  [place] [address]      Aug 11, 2021 11:30 AM  (Arrive by 11:15 AM)  RETURN  30 with Lisette Abu, MD  [facility] [place] [facility] REGION) [address]  [place] [address]  [phone]      Aug 23, 2021  1:00 PM  (Arrive by 12:45 PM)  INFUSION- ACC REMICADE60 UNCTF with UNCTIF 13, UNCTIF RN 1  [facility] THERAPEUTIC INFUSION [facility] [place] [facility] REGION) [address]  [place] [address]  [phone]      Sep 19, 2021  1:00 PM  (Arrive by 12:45 PM)  INFUSION- ACC REMICADE60 UNCTF with UNCTIF 12, UNCTIF RN 2  [facility]APEUTIC [facility]FUSION [facility] [place] [address] [facility] REGION) [address]  [place] [address]  [phone]  ______________________________________________________________________  Discharge Day Services:  BP 169/82  - Pulse 107  - Temp 36.9 ??C (98.4 ??F) (Oral)  - Resp 18  - Ht 167.6 cm (5' 6)  - Wt (!) 176.6 kg (389 lb 5.3 oz)  - SpO2 98%  - BMI 62.84 kg/m??     Pt seen on the day of discharge and determined appropriate for discharge.     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

## 2021-08-02 NOTE — Unmapped (Signed)
Facility Information: [facility]   Facility ID: [id number]  Facility Medicare Provider Number: [id number]        Physical Medicine and Rehabilitation  Rehab Pre-Admit Screening  Date: 08/02/2021   Time: 1:56 PM     Patient Information:    Patient Name:  Summer Russell Medical Record Number: [id number]   Address:  [address], [place] [address] Sex: Female   Date of Birth: August 26, 1975 Age: 45 y.o.   Room/Bed:  8331/8331-01    _____________________________________________________________________________    Attending Physician's Review and Admission Determination     Medical Necessity:  The patient requires acute inpatient rehabilitation to maximize functional independence and requires daily physician visits for monitoring/management of nephrotic syndrome, vital signs, anticoagulation, medications, skin and pain control. This patient's rehabilitation goals and medical complexity could not adequately be managed in a less intensive setting.  Potential risks for clinical complications include kidney failure, falls, adverse medication reactions, DVT/PE, pressure ulcers and worsening of underlying disease.    Medical Prognosis: Good for continued progress and participation with therapy.    Anticipated Interdisciplinary Rehabilitation Interventions: Activity tolerance: Patient is expected to tolerate minimum of 3 hrs of therapy daily @ 5x/week. Physical therapy to work on mobility. Occupational therapy to work on self care. Recreational therapy for community reintegration and relaxation training. Rehab Nursing to work on medication administration, patient/family education, skin care, fall prevention, bowel and bladder management, vital signs and feeding/nutrition. Weekly interdisciplinary team conference to assess progress and plan of care changes.    Expected Functional Outcomes:  Expected level of improvement/goals for inpatient rehabilitation include modified independence for transfers, modified independence for ambulation and modified independent for self care (ADLs)    Rehab Impairment Group Code [facility]): (Neurologic Conditions) 03.3 Polyneuropathy   Etiologic Diagnosis: polyneuropathy    Jeni Salles III, MD 08/02/2021 2:19 PM    _____________________________________________________________________________    Advanced Directives:  Does patient have an advance directive covering medical treatment?: Patient does not have advance directive covering medical treatment.       Reason patient does not have an advance directive covering medical treatment:: Patient needs follow-up to complete one.      Coverage Information:  Authorization number:    Authorization Code:  Activation code not generated  Current MyChart Status: Active  Payor: [place] [employer] / Plan:  M[employer] / Product Type: *No Product type* /     Physician/Referral Information:  Referring Facility: [facility]       Jearld Pies, MD  Referring Case Manager: Lady Saucier Clos      Patient / Family / Caregiver Orientation: Pt and family in agreement with [facility]      Prior Living Situation:  Living Environment: House    Lives With: Spouse    Home Living: One level home; Ramped entrance; Standard height toilet; Walk-in shower      Prior Level of Function:  Prior Function Comments: Pt reports that ~1 year ago she was independent with ADLs and active in the community. Around Oct of 2021 she injured her achilles and was placed on bedrest for 6 weeks, which became 6 months. She began PT after 6 months and recently had to stop 2/2 insurance, but does not feel that she has returned to her baseline functioning. She spends almost all of her time in her bedroom, leaving the house only for doctor's appointments. She endorses many close falls, but only one  complete fall in the past year. She uses a w/c for functional mobility outside of the home but is able to furniture walk within her house. Pt's w/c does not fit on their ramp, therefore she walks up/down the ramp to enter/exit the house and uses the railings for stability. She enjoys spending time with her dogs and cats at home                           Rehabilitation Diagnosis:  Etiologic Diagnosis / Description: Summer Russell is a 45 y.o. 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 MRI brain negative and suspect element of DM neuropathy. Stable and awaiting rehab placement. Summer Russell has participated in acute inpatient physical and occupational therapies to improve functional mobility, activity tolerance, functional strength, balance, and endurance in order to facilitate safe performance of ADLs and daily routines.  Summer Russell has been referred to [facility] for continued acute medical management, provision of intensive inpatient therapies, and patient/family training to facilitate safe performance of ADLs and mobility, prior to discharge home.      Impairment Group: (Neurologic Conditions) 03.3 Polyneuropathy      Date of Onset: 07/25/21         Patient Active Problem List    Diagnosis Date Noted   ??? Lactic acidosis 07/25/2021   ??? Hyperglycemia due to type 2 diabetes mellitus (CMS-HCC) 07/25/2021   ??? Proteinuria 07/25/2021   ??? CKD (chronic kidney disease) 07/25/2021   ??? Hyponatremia 07/25/2021   ??? Axillary hidradenitis suppurativa 07/25/2021   ??? Psoriasis 07/25/2021   ??? Hypothyroidism 07/25/2021   ??? Hyperlipidemia 07/25/2021   ??? Arthritis with psoriasis (CMS-HCC) 10/09/2017   ??? Cellulitis and abscess of skin of abdomen 10/09/2017   ??? 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 Hypertension 04/16/2012   ??? Hidradenitis 04/16/2012   ??? Morbid obesity (CMS-HCC) 04/16/2012   ??? Type II diabetes mellitus, uncontrolled 03/18/2012       Medical / Functional Conditions Requiring Inpatient Rehab: Patient requires monitoring of labwork, electrolytes, blood pressure and monitoring/management of medical diagnosis. Medical management/administration of statins, analgesics, antiemetics, anticoagulants, diabetic agents and other prescribed/necessary medications.  Monitoring/management of pain, cardiopulmonary status, renal status, gastrointestinal status, neurological status, infection, bleeding, bowel and bladder, DVT, and skin breakdown.      Risk for Medical / Clinical Complication: Patient at increased risk for complications of cardiopulmonary insufficiency, renal insufficiency, hypertensive crisis, gastrointestinal insufficiency, neurological decline, seizures, fluid volume overload, aspiration, bleeding, infection, uncontrolled pain, bowel/bladder retention, falls, and skin breakdown.      Special Rehabilitation Needs:  IV Lines / Tubes / Drains: Port    Hearing - Right Ear: Functional    Hearing - Left Ear: Functional     Nutrition Type: General; 2 gram sodium diet     Weight Bearing Status: Non-applicable      Precautions: Falls precautions       Required Braces or Orthoses: Non-applicable    Was the influenza vaccine received in this facility during this year's vaccination season?  Not documented    Lines/Drains/Airways:  Urethral Catheter Non-latex (Active)   Site Assessment Intact;Clean 08/02/21 0625   Collection Container Standard drainage bag 08/02/21 [phone]  Securement Method Securing device (Describe) 08/02/21 0625   Urinary Catheter Necessity Yes 08/02/21 0625   Necessity reviewed with Med W 08/02/21 0625   Indications Urinary obstruction/relief of urinary retention not manageable by other means (Intermittent catheterization, nursing interventions, etc.) 08/02/21 0625   Output (mL) 1200 mL 08/02/21 0625   Number of days: 7       Power Port--a-Cath Single Hub 11/06/19 Right Internal jugular (Active)   Site Assessment Clean;Dry;Intact 08/02/21 0800   Line Interventions Connections checked 08/02/21 0800   Medial or Single Lumen:  Line Status / Patency Saline locked 08/02/21 0800   Port Flush Status Flushed-Push Pause/Turbulent 08/02/21 0800   Accessed by: Maralyn Sago, rn 07/24/21 2200   Access Date 07/31/21 07/30/21 0800   Access Time 2200 07/24/21 2200   Access Needle Size 20g X 3/4 inch 07/24/21 2200   Dressing Type CHG gel;Occlusive 08/02/21 0800   Dressing Status      Clean;Dry;Intact/not removed 08/02/21 0800   Dressing Intervention No intervention needed 08/01/21 2100   Deaccess Date 06/26/21 06/26/21 1505   Deaccess time 1631 06/26/21 1505   Deaccessed Port Flush Yes 06/26/21 1505   Line Necessity Reviewed? Y 08/02/21 0800   Line Necessity Indications Yes - Medications requiring central line access (Consult Pharmacy PRN) 08/02/21 0800   Line Necessity Reviewed With Med W 08/01/21 2100   Infiltration 0-->no symptoms 08/02/21 0800   Number of days: 635              Past Medical History:  Past Medical History:   Diagnosis Date   ??? CKD (chronic kidney disease) 07/25/2021   ??? Diabetes mellitus (CMS-HCC)    ??? Disease of thyroid gland    ??? Fibromyalgia    ??? Hydradenitis    ??? Psoriasis    ??? Psoriatic arthritis (CMS-HCC)    ??? Sleep apnea      Past Surgical History:  Past Surgical History:   Procedure Laterality Date   ??? IR INSERT PORT AGE GREATER THAN 5 YRS  11/06/2019    IR INSERT PORT AGE GREATER THAN 5 YRS 11/06/2019 Soledad Gerlach, MD IMG [facility]   ??? SKIN BIOPSY     ??? TONSILLECTOMY       Family History:  Family History   Adopted: Yes   Problem Relation Age of Onset   ??? Diabetes Mother    ??? Hypertension Mother    ??? Diabetes Father    ??? Hypertension Father    ??? Melanoma Neg Hx    ??? Basal cell carcinoma Neg Hx    ??? Squamous cell carcinoma Neg Hx      Social History:  Social History     Socioeconomic History   ??? Marital status: Divorced Spouse name: None   ??? Number of children: None   ??? Years of education: None   ??? Highest education level: None   Tobacco Use   ??? Smoking status: Former     Packs/day: 2.00     Years: 20.00     Pack years: 40.00     Types: Cigarettes     Quit date: 2013     Years since quitting: 9.9   ??? Smokeless tobacco: Never   Substance and Sexual Activity   ??? Alcohol use: No   ??? Drug use: No   Other Topics Concern   ??? Do you use sunscreen? Yes   ??? Tanning bed use? No   ??? Are you easily burned? No   ???  Excessive sun exposure? No   ??? Blistering sunburns? Yes     Social Determinants of Health     Financial Resource Strain: Low Risk    ??? Difficulty of Paying Living Expenses: Not hard at all   Food Insecurity: No Food Insecurity   ??? Worried About Programme researcher, broadcasting/film/video in the Last Year: Never true   ??? Ran Out of Food in the Last Year: Never true   Transportation Needs: No Transportation Needs   ??? Lack of Transportation (Medical): No   ??? Lack of Transportation (Non-Medical): No     Current Facility-Administered Medications Ordered in Epic   Medication Dose Route Frequency Provider Last Rate Last Admin   ??? acetaminophen (TYLENOL) tablet 1,000 mg  1,000 mg Oral Q8H PRN Benjaman Kindler, MD   1,000 mg at 08/02/21 0757   ??? aspirin chewable tablet 81 mg  81 mg Oral Daily Benjaman Kindler, MD   81 mg at 08/02/21 0454   ??? atorvastatin (LIPITOR) tablet 20 mg  20 mg Oral Daily Benjaman Kindler, MD   20 mg at 08/02/21 0981   ??? cetirizine (ZyrTEC) tablet 10 mg  10 mg Oral Daily Benjaman Kindler, MD   10 mg at 08/02/21 1914   ??? cholecalciferol (vitamin D3 25 mcg (1,000 units)) tablet 25 mcg  25 mcg Oral Daily Benjaman Kindler, MD   25 mcg at 08/02/21 7829   ??? clindamycin (CLEOCIN) capsule 300 mg  300 mg Oral BID Benjaman Kindler, MD   300 mg at 08/02/21 5621   ??? dextrose (D10W) 10% bolus 125 mL  12.5 g Intravenous Q15 Min PRN Ocie Doyne, MD       ??? diclofenac sodium (VOLTAREN) 1 % gel 2 g  2 g Topical QID Benjaman Kindler, MD   2 g at 07/31/21 1249 ??? senna (SENOKOT) tablet 1 tablet  1 tablet Oral BID PRN Benjaman Kindler, MD   1 tablet at 08/01/21 3086    And   ??? docusate sodium (COLACE) capsule 100 mg  100 mg Oral BID PRN Benjaman Kindler, MD   100 mg at 08/01/21 5784   ??? empagliflozin (JARDIANCE) tablet 10 mg  10 mg Oral Daily Starleen Blue, MD   10 mg at 08/02/21 6962   ??? enoxaparin (LOVENOX) syringe 40 mg  40 mg Subcutaneous Q12H Benjaman Kindler, MD   40 mg at 08/02/21 0809   ??? fenofibrate (TRICOR) tablet 48 mg  48 mg Oral Daily Katrine Coho, MD   48 mg at 08/02/21 9528   ??? finasteride (PROSCAR) tablet 5 mg  5 mg Oral Daily Benjaman Kindler, MD   5 mg at 08/02/21 4132   ??? glucagon injection 1 mg  1 mg Intramuscular Once PRN Benjaman Kindler, MD       ??? glucose chewable tablet 16 g  16 g Oral Q10 Min PRN Benjaman Kindler, MD       ??? influenza vaccine quad (FLUARIX, FLULAVAL, FLUZONE) (6 MOS & UP) 2022-23  0.5 mL Intramuscular During hospitalization Jearld Pies, MD       ??? insulin glargine (LANTUS) injection 60 Units  60 Units Subcutaneous Nightly Derald Macleod, MD   60 Units at 08/01/21 2150   ??? insulin lispro (HumaLOG) injection 0-20 Units  0-20 Units Subcutaneous ACHS Benjaman Kindler, MD   4 Units at 08/02/21 1312   ??? insulin lispro (HumaLOG) injection 10 Units  10 Units Subcutaneous TID AC Altamese Rachel, MD       ??? levothyroxine (SYNTHROID) tablet 25 mcg  25 mcg Oral Once per day on Tue Thu Benjaman Kindler, MD   25 mcg at 08/01/21 1610   ??? levothyroxine (SYNTHROID) tablet 50 mcg  50 mcg Oral Daily Benjaman Kindler, MD   50 mcg at 08/02/21 9604   ??? lisinopriL (PRINIVIL,ZESTRIL) tablet 40 mg  40 mg Oral Daily Katrine Coho, MD   40 mg at 08/02/21 5409   ??? LORazepam (ATIVAN) injection 2 mg  2 mg Intravenous Daily PRN Derald Macleod, MD       ??? meclizine (ANTIVERT) tablet 25 mg  25 mg Oral TID PRN Starleen Blue, MD   25 mg at 07/28/21 0601   ??? nystatin (MYCOSTATIN) powder 1 application  1 application Topical BID Starleen Blue, MD   1 application at 08/02/21 0814   ??? ondansetron [facility]) injection 8 mg  8 mg Intravenous Q6H PRN Starleen Blue, MD   8 mg at 07/28/21 2308   ??? pantoprazole (PROTONIX) EC tablet 40 mg  40 mg Oral Daily Benjaman Kindler, MD   40 mg at 08/02/21 8119   ??? pneumoc 20-val conj-dip cr(PF) (PREVNAR-20) vaccine 0.5 mL  0.5 mL Intramuscular During hospitalization De-Vaughn Sima Matas, MD       ??? polyethylene glycol (MIRALAX) packet 17 g  17 g Oral Daily PRN Benjaman Kindler, MD       ??? prochlorperazine (COMPAZINE) injection 5 mg  5 mg Intravenous Q6H PRN Starleen Blue, MD   5 mg at 07/31/21 1248     No current Epic-ordered outpatient medications on file.     Medications Prior to Admission   Medication Sig Dispense Refill Last Dose   ??? ascorbic Acid (VITAMIN C) 500 mg CpER Take 500 mg by mouth.   07/24/2021   ??? aspirin (ECOTRIN) 81 MG tablet Take 81 mg by mouth daily.   07/24/2021   ??? atorvastatin (LIPITOR) 20 MG tablet Take 20 mg by mouth daily.   07/24/2021   ??? cetirizine (ZYRTEC) 10 MG tablet Take 10 mg by mouth daily.   07/24/2021   ??? cholecalciferol, vitamin D3, 1,000 unit (25 mcg) tablet Take 1,000 Units by mouth.   07/24/2021   ??? clindamycin (CLEOCIN) 300 MG capsule TAKE 1 CAPSULE BY MOUTH TWO TIMES A DAY. 60 capsule 6 07/24/2021   ??? finasteride (PROSCAR) 5 mg tablet TAKE 1 TABLET BY MOUTH EVERY DAY 90 tablet 3 07/24/2021   ??? fluconazole (DIFLUCAN) 150 MG tablet TAKE 1 TAB NOW AN THEN TAKE 1 TABLET ONCE WEEKLY AS NEEDED FOR YEAST INFECTION 4 tablet 5 More than a month   ??? furosemide (LASIX) 40 MG tablet Take 60 mg by mouth daily.   Past Week   ??? gabapentin (NEURONTIN) 300 MG capsule Take 1 capsule (300 mg total) by mouth Two (2) times a day. 60 capsule 0    ??? levothyroxine (SYNTHROID, LEVOTHROID) 50 MCG tablet Take by mouth. Taking on Monday Wednesday Friday, Saturday & Sunday and on Tuesday & Thursday  0 07/24/2021   ??? lisinopril (PRINIVIL,ZESTRIL) 10 MG tablet Take 20 mg by mouth daily.   07/24/2021   ??? meloxicam (MOBIC) 15 MG tablet Take 15 mg by mouth daily.  1 07/24/2021   ??? metFORMIN (GLUCOPHAGE) 500 MG tablet Take 1,000 mg by mouth Two (2) times a day.   07/24/2021   ???  multivitamin capsule Take 1 capsule by mouth daily.   07/24/2021   ??? NIKKI, 28, 3-0.02 mg per tablet TAKE 1 TABLET BY MOUTH EVERY DAY 84 tablet 2 07/24/2021   ??? pantoprazole (PROTONIX) 40 MG tablet Take 40 mg by mouth.   07/24/2021   ??? secukinumab (COSENTYX) 150 mg/mL PnIj injection Inject the contents of 2 pens (300 mg total) under the skin every fourteen (14) days as maintenance doses.  START 14 days after last loading dose. 4 mL 3 Unknown   ??? BD INSULIN SYRINGE ULTRA-FINE 1 mL 31 gauge x 5/16 (8 mm) Syrg USE 2 (TWO) TIMES DAILY AS DIRECTED      ??? blood sugar diagnostic Strp Frequency:PHARMDIR   Dosage:0.0     Instructions:  Note:Use to test blood sugar in the morning before breakfast and in the evening two hours after dinner, ISD-9 250.00. Dose: 1      ??? blood-glucose meter Misc U QID UTD      ??? empty container Misc Use as directed to dispose of Cosentyx pens. 1 each 2    ??? lidocaine (XYLOCAINE) 5 % ointment Apply to painful areas every 3-4 hours as needed 60 g 11    ??? pen needle, diabetic 31 gauge x 5/16 Ndle Use 1 needle to skin four times daily as directed             Vitals:    08/01/21 0547 08/01/21 0845 08/02/21 0135 08/02/21 0900   BP:  137/85 149/87 138/69   Pulse:  110 108 107   Resp:  16 18 18    Temp:  37.2 ??C (99 ??F) 37.2 ??C (99 ??F)    TempSrc:  Oral Oral    SpO2:  96% 97% 97%   Weight: (!) 176.6 kg (389 lb 5.3 oz)      Height:           Vitals:    Height:  167.6 cm (5' 6), Weight: (!) 176.6 kg (389 lb 5.3 oz)    Labs:      CBC -   Lab Results   Component Value Date    WBC 4.1 08/01/2021    RBC 4.02 08/01/2021    HGB 12.1 08/01/2021    HCT 36.0 08/01/2021    MCV 89.5 08/01/2021    MCH 30.2 08/01/2021    MCHC 33.7 08/01/2021    MPV 8.8 08/01/2021    PLT 265 08/01/2021     BMP -   Lab Results   Component Value Date    NA 141 08/01/2021    K 3.6 08/01/2021    CL 101 08/01/2021    CO2 28.0 08/01/2021    BUN 16 08/01/2021    CREATININE 0.91 (H) 08/01/2021    GFR >= 60 06/02/2012    GFRAAF >90 12/06/2020    GFRNAAF >90 12/06/2020    GLU 231 (H) 08/01/2021     CARD -   Lab Results   Component Value Date    TROPONINI 19 07/24/2021     Coagulation -   Lab Results   Component Value Date    PT 13.0 (H) 05/26/2012    INR 1.2 05/26/2012    APTT 32.7 05/26/2012     ABGs- No results found for: PHART, PO2ART, PCO2ART, BEART, HCO3ART, O2SATART  LFT's -   Lab Results   Component Value Date    ALBUMIN 2.0 (L) 07/30/2021    ALT 26 07/30/2021    AST 38 (H) 07/30/2021  ALKPHOS 64 07/30/2021    BILITOT 0.3 07/30/2021    PROT 5.5 (L) 07/30/2021       Current Functional Status:  ADLs: Needs assistance with ADLs    ADLs - Needs Assistance: Feeding; Grooming; Bathing; Toileting; UB dressing; LB dressing    Feeding - Needs Assistance: Set Up Assist; Performed at bed level    Grooming - Needs Assistance: Performed at Bed level; Set Up Assist    Bathing - Needs Assistance: Mod assist; Max assist    Toileting - Needs Assistance: Mod assist; Max assist    UB Dressing - Needs Assistance: Min assist; Performed at bed level    LB Dressing - Needs Assistance: Mod assist; Max assist         Mobility:   Bed Mobility: supine to sit with SBA, sit to supine with CGA, increased time for progressing LEs    Transfers: sit<>stand with CGA and RW x3 trials    Gait: All performed with CGA and bariatric RW: side-stepping at EOB x2 ft, seated rest break, stepping in place x12 steps, seated rest break, stepping in place x12 steps      Cognition/Swallow/Speech:  Patient's Vision Adequate to Safely Complete Daily Activities: Yes    Patient's Judgement Adequate to Safely Complete Daily Activities: Yes    Patient's Memory Adequate to Safely Complete Daily Activities: Yes    Patient Able to Express Needs/Desires: Yes    Patient has speech problem: No      DME Recommendations:  PT DME Recommendations: Defer to post acute    OT DME Recommendations: Defer to post acute      Willingness to Participate: Pt willing and able to participate in three hours of daily therapy    Rehab Goals and Plan    Expected level of improvement for safe discharge: Patient will discharge home as independent as possible with ADLs and functional mobility with least restrictive assistive device for household distances.     Patient / Family Goals: Patient will safely return home with family, modified independence in ADLs and assist as needed with ambulation for household distances.    Required treatments and services: Rehab nursing, Case management, Dietician/nutrition     Anticipated Interventions:    Physical Therapy: 60-120 min/day 5-7 days/wk  Occupational Therapy: 60-120 min/day 5-7 days/wk  Recreational therapy: 30 min/day 3 days/wk  Prosthetics and Orthotics: As Needed      Anticipated services upon discharge:     Outpatient therapy: PT, OT    Expected discharge destination: Home with spouse    Discharge support: Patient has a caregiver available    Patient/family/caregiver orientation: Patient and family agreeable to inpatient rehab plan    Estimated Length of Stay: 10-14 days     Projected Admission Date: Wednesday, December 28th, 2022    Reviewer's Signature, Date and Time: Seabron Spates, MS CCC/SLP  [facility]  Inpatient Coordinator  Office: [phone]    1:56 PM 08/02/2021

## 2021-08-02 NOTE — Unmapped (Incomplete)
This patient was not seen in person and the consultation was conducted over the phone or virtually via WebEx or Mychart Bedside. The Diabetes Education Team Consult Service has moved to a hybrid model to minimize potential spread of COVID-19 and protect patients/providers. During this time, we will be limiting person-to-person contact when possible.       Diabetes education consultation: Consulted for assistance with providing instruction on diabetes self-management skills. Visit with [name] at the bedside. Provided *** teaching diabetes education.     Assessment: @ admitted with ***.  Summer Russell has {Diabetes:52477} see MAR for medication list.      A1C:    Lab Results   Component Value Date    A1C 10.9 (H) 07/25/2021       Insulin administration & safety: Discussed current regimen, explaining long-acting vs rapid acting action, timing of administration & described correctional sliding scale & connection to glucose monitoring.  Instructed on insulin administration with pens.   Summer Russell returned Geophysical data processor & did well, stating {DEHEORSHE:52393::He} felt confident with ability.    Instructed on insulin safety - including injection site selection & rotation, insulin pen storage, sharps disposal & discard/expiration date.  Requested that clinical nursing staff have [name] inject insulin doses, so {DEHEORSHE:52393::He} can get practice with self-injection.  Also asked that they review correctional scale as well.     Problem-solving:   Hypoglycemia:    Instructed on hypoglycemia recognition & treatment, including 15-15 rule, listing several examples of appropriate fast carb sources, emphasizing importance of having something readily available. Described possible causes and prevention.  Instructed to contact provider for unexplained episode, an episode requiring more than 1 treatment, or 2 episodes within a 2-3 day period, for possible dose adjustment.    Provided printed material: Hypoglycemia in Diabetes as a resource.  Hyperglycemia:  Instructed on hyperglycemia recognition and treatment. Described possible causes and prevention and when to contact provider.   Provided printed material: Hyperglycemia  And Diabetes: Blood Sugar Emergencies as resources      Risk Reduction- Foot Care:  Discussed principles of good foot care such as daily cleansing, and inspection, avoiding walking barefoot, having well-fitting shoes and no bathroom surgery on feet.  Provided education material Diabetes Foot Health: Care Instructions as a resource.      Being Active:  Discussed the impact of physical activity on blood sugars, encouraged to engage in physical activity after meals, examples provided.     Healthy Eating:  Discussed with patient meal planning principles for glucose control including the plate method, which foods are carbs and portion control. Reviewed avoiding sugary beverages/concentrated sweet. Reviewed dietary guidelines/ plate method/ avoiding concentrated sweets/eating at consistent times.   Meal Planning Basics  Foods that Affect and Do Not Affect Blood Glucose  Timing, Amount, and Balance in Meals  Food Labels  Carbohydrate Counting   Low Carbohydrate Snacks    Provided [name] a copy of the Consistent Carbohydrate & Pediatric Consistent Carbohydrate guide to use while in house.       Insurance: [name] has {CM RC3 Insurance Options:32955}. There {DEAREORARENOT:52388} financial barriers to glucose monitoring. *** plans to apply for pharmacy assistance.     Monitoring: Provided [name] with an {DEMETERS:52389} meter kit and instructed on use. Summer Russell  demonstration by performing a glucose check, loading lancing device, placing test strip in meter correctly, obtaining a finger  stick sample, and applying sample to end of test strip. Provided with copies of large print log sheets to maintain a record of readings & instructed to bring to follow-up visits with provider for evaluation of treatment plan.    Monitoring Supplies needing prescriptions at discharge:  Please order glucose meter covered by insurance    Recommendations: At f/u visit with PCP request referral for DSMES (Diabetes Self Management Education and Support) after discharge.     Plan:***  Thank You,   {DEEDUCATORS1:75067}

## 2021-08-02 NOTE — Unmapped (Addendum)
[ ]   follow Fam Med recs for insulin + vol status  [ ]  coordinate outpatient kidney Bx with [facility]    - Afrin for nasal congestion   - Inc Glargine   - Stopped Ensure   - Daily BMPs

## 2021-08-03 ENCOUNTER — Ambulatory Visit
Admission: TF | Admit: 2021-08-03 | Discharge: 2021-08-15 | Disposition: A | Discharge status: Home Health | Payer: BLUE CROSS/BLUE SHIELD | Source: Intra-hospital | Admitting: Spinal Cord Injury Medicine

## 2021-08-03 LAB — CBC
HEMATOCRIT: 33 % — ABNORMAL LOW (ref 34.0–44.0)
HEMOGLOBIN: 11.5 g/dL (ref 11.3–14.9)
MEAN CORPUSCULAR HEMOGLOBIN CONC: 35 g/dL (ref 32.0–36.0)
MEAN CORPUSCULAR HEMOGLOBIN: 31.4 pg (ref 25.9–32.4)
MEAN CORPUSCULAR VOLUME: 89.9 fL (ref 77.6–95.7)
MEAN PLATELET VOLUME: 8.4 fL (ref 6.8–10.7)
PLATELET COUNT: 216 10*9/L (ref 150–450)
RED BLOOD CELL COUNT: 3.67 10*12/L — ABNORMAL LOW (ref 3.95–5.13)
RED CELL DISTRIBUTION WIDTH: 13.4 % (ref 12.2–15.2)
WBC ADJUSTED: 3.8 10*9/L (ref 3.6–11.2)

## 2021-08-03 LAB — BASIC METABOLIC PANEL
ANION GAP: 8 mmol/L (ref 5–14)
BLOOD UREA NITROGEN: 16 mg/dL (ref 9–23)
BUN / CREAT RATIO: 18
CALCIUM: 8.2 mg/dL — ABNORMAL LOW (ref 8.7–10.4)
CHLORIDE: 104 mmol/L (ref 98–107)
CO2: 27.3 mmol/L (ref 20.0–31.0)
CREATININE: 0.88 mg/dL — ABNORMAL HIGH
EGFR CKD-EPI (2021) FEMALE: 83 mL/min/{1.73_m2} (ref >=60)
GLUCOSE RANDOM: 204 mg/dL — ABNORMAL HIGH (ref 70–179)
POTASSIUM: 3.7 mmol/L (ref 3.4–4.8)
SODIUM: 139 mmol/L (ref 135–145)

## 2021-08-03 LAB — MAGNESIUM: MAGNESIUM: 1.8 mg/dL (ref 1.6–2.6)

## 2021-08-03 MED ADMIN — insulin lispro (HumaLOG) injection 0-20 Units: 0-20 [IU] | SUBCUTANEOUS | @ 23:00:00

## 2021-08-03 MED ADMIN — finasteride (PROSCAR) tablet 5 mg: 5 mg | ORAL | @ 13:00:00

## 2021-08-03 MED ADMIN — nystatin (MYCOSTATIN) powder 1 application: 1 | TOPICAL | @ 13:00:00

## 2021-08-03 MED ADMIN — insulin lispro (HumaLOG) injection 10 Units: 10 [IU] | SUBCUTANEOUS | @ 18:00:00 | Stop: 2021-08-03

## 2021-08-03 MED ADMIN — insulin lispro (HumaLOG) injection 0-9 Units: 0-9 [IU] | SUBCUTANEOUS | @ 18:00:00 | Stop: 2021-08-03

## 2021-08-03 MED ADMIN — insulin glargine (LANTUS) injection 60 Units: 60 [IU] | SUBCUTANEOUS | @ 01:00:00

## 2021-08-03 MED ADMIN — cetirizine (ZyrTEC) tablet 10 mg: 10 mg | ORAL | @ 13:00:00

## 2021-08-03 MED ADMIN — insulin lispro (HumaLOG) injection 10 Units: 10 [IU] | SUBCUTANEOUS | @ 14:00:00 | Stop: 2021-08-03

## 2021-08-03 MED ADMIN — clindamycin (CLEOCIN) capsule 300 mg: 300 mg | ORAL | @ 14:00:00 | Stop: 2021-08-08

## 2021-08-03 MED ADMIN — cholecalciferol (vitamin D3 25 mcg (1,000 units)) tablet 25 mcg: 25 ug | ORAL | @ 13:00:00

## 2021-08-03 MED ADMIN — fenofibrate (TRICOR) tablet 48 mg: 48 mg | ORAL | @ 14:00:00

## 2021-08-03 MED ADMIN — empagliflozin (JARDIANCE) tablet 10 mg: 10 mg | ORAL | @ 13:00:00

## 2021-08-03 MED ADMIN — acetaminophen (TYLENOL) tablet 1,000 mg: 1000 mg | ORAL | @ 23:00:00

## 2021-08-03 MED ADMIN — enoxaparin (LOVENOX) syringe 40 mg: 40 mg | SUBCUTANEOUS | @ 13:00:00

## 2021-08-03 MED ADMIN — insulin lispro (HumaLOG) injection 0-9 Units: 0-9 [IU] | SUBCUTANEOUS | @ 12:00:00 | Stop: 2021-08-03

## 2021-08-03 MED ADMIN — insulin lispro (HumaLOG) injection 0-20 Units: 0-20 [IU] | SUBCUTANEOUS | @ 01:00:00

## 2021-08-03 MED ADMIN — aspirin chewable tablet 81 mg: 81 mg | ORAL | @ 13:00:00

## 2021-08-03 MED ADMIN — enoxaparin (LOVENOX) syringe 40 mg: 40 mg | SUBCUTANEOUS | @ 01:00:00

## 2021-08-03 MED ADMIN — nystatin (MYCOSTATIN) powder 1 application: 1 | TOPICAL | @ 01:00:00

## 2021-08-03 MED ADMIN — lisinopriL (PRINIVIL,ZESTRIL) tablet 40 mg: 40 mg | ORAL | @ 13:00:00

## 2021-08-03 MED ADMIN — levothyroxine (SYNTHROID) tablet 75 mcg: 75 ug | ORAL | @ 11:00:00

## 2021-08-03 MED ADMIN — pantoprazole (PROTONIX) EC tablet 40 mg: 40 mg | ORAL | @ 13:00:00

## 2021-08-03 MED ADMIN — atorvastatin (LIPITOR) tablet 20 mg: 20 mg | ORAL | @ 13:00:00

## 2021-08-03 MED ADMIN — clindamycin (CLEOCIN) capsule 300 mg: 300 mg | ORAL | @ 01:00:00 | Stop: 2021-08-08

## 2021-08-03 NOTE — Unmapped (Signed)
Pt is A&O x4. Continent of bowel and bladder. Requested a purewick due to not being able to fit on the bed pan. Denies pain. Spouse is at bed side. Safety measures maintained. Bed is in lowest position. Call bell within reach. Will continue to monitor.    Problem: Rehabilitation (IRF) Plan of Care  Goal: Plan of Care Review  Outcome: Progressing  Goal: Patient-Specific Goal (Individualized)  Outcome: Progressing  Goal: Absence of New-Onset Illness or Injury  Outcome: Progressing  Intervention: Prevent Fall and Fall Injury  Recent Flowsheet Documentation  Taken 08/03/2021 0200 by Sherril Cong, RN  Safety Interventions: fall reduction program maintained  Goal: Optimal Comfort and Wellbeing  Outcome: Progressing  Goal: Home and Community Transition Plan Established  Outcome: Progressing     Problem: Impaired Wound Healing  Goal: Optimal Wound Healing  Outcome: Progressing     Problem: Self-Care Deficit  Goal: Improved Ability to Complete Activities of Daily Living  Outcome: Progressing

## 2021-08-03 NOTE — Unmapped (Signed)
PHYSICAL MEDICINE & REHABILITATION  History & Physical       ASSESSMENT:     Summer Russell is a 45 y.o. female with PMHx of HTN, HLD, GERD, uncontrolled type 2 diabetes, polyneuropathy, hypothyroidism,  psoriasis complicated by arthritis (on long-standing infliximab infusions), fibromyalgia, and sleep apnea admitted to [facility] 12/20 for severe hyperglycemia and lactic acidosis and nephrotic range proteinuria along with worsening left sided weakness, urinary retention and saddle anesthesia.     Impairment Group Code: (Neurologic Conditions) 03.3 Polyneuropathy      PLAN:     REHAB:   - PT and OT to maximize functional status with mobility and ADLs as well as prevention of joint contracture   - RT for community re-integration, relaxation, and Support Group  - P&O for assistive devices prn  - Pharmacy consult for patient and family education on medication management   - Patient will be discussed at next interdisciplinary team conference      Lower extremity weakness/Sensory deficits: MRI brain negative (12/24). Patient unable to tolerate close spaces so spine MRI not obtainable. Patient may have lumbosacral plexopathy (such as diabetic amyotrophy vs polyneuropathy) in setting of known neuropathy, so EMG/NCS could be considered, however due to body habitus, may be more challenging to perform. Deferred EMG inpatient as symptoms improved with better glucose control.  [ ]  Plan for outpatient referral to neurology to consider outpatient EMG/NCS  Urinary retention:  Foley removed for TOV on 12/28 prior to admission  If failed void, recommend replacing foley with repeat bladder rest x7 days.   Prophylaxis:  Lovenox 40 mg BID (weight based)  Pain:  Tylenol 1000 mg TID prn    Hyperglycemia - Uncontrolled T2DM with insulin dependence: BG elevated to >400 during acute hospitalization (no AG or acidemia to suggest DKA).   - inpatient consult to family medicine for insulin titration/comangement   - inpatient consult to diabetes education  - Continue Jardiance 10 mg daily   - Continue Lantus to 60 U qhs  - 10U Lispro TIDAC + SSI  - Holding metformin  [ ]  plan for outpatient referral to [facility] endocrinology    Nephrotic range proteinuria: Nephrology consulted during acute hospitalization due to AKI, proteinuria and elevated lactate. 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  - Continue lisinopril 40 mg daily and empagliflozin 10 mg daily as tolerated for proteinuria  - HOLD home OCP (ethinyl estradiol/drospirenone), given hypercoagulability associated with nephrotic syndrome per pharmacy recommendation.   [ ]  consider restarting bumex 1mg  BID if urine output decreases or swelling increases  [ ]  Consider rescheduling outpatient nephrology follow-up appointment pending rehab disposition (currently scheduled on 08/11/2020)  [ ]  plan to coordinate outpatient kidney biopsy with [facility] prior to dischareg    Hydradenitis suppurativa - Psoriasis: Managed by dermatology outpatient; on IV Renflexis, finasteride, and clindamycin. S/p Renflexis infusion 12/19  - Continue home clindamycin 300 mg BID and finasteride 5 mg daily    Vertigo/nausea: zyrtec 10 mg daily, compazine (first line), Zofran (second line), meclizine prn,   Hypothyroidism - Continue home Synthroid 50 mcg every day + 25 mcg T/Th  HTN - Lisinopril and Bumex as above   HLD - Continue home Lipitor 20 mg daily, fenofibrate 48 mg daily, ASA 81 mg daily  Sleep Apnea - Does not tolerate CPAP at bedtime  GERD - Continue home protonix 40 mg daily    DISPO: Admitted to Rehab floor, patient will be  discussed at next interdisciplinary team conference   EDD: pending  Follow up: PCP, neurology referral, nephrology, endocrinology referral     Estimated Length of Stay: 10-14 days  Anticipated Post-Rehab Destination / Needs:  home      Medical Necessity:  The patient requires acute inpatient rehabilitation to maximize functional independence and requires daily physician visits for monitoring/management of nephrotic syndrome, vital signs, anticoagulation, medications, skin and pain control. This patient's rehabilitation goals and medical complexity could not adequately be managed in a less intensive setting.  Potential risks for clinical complications include kidney failure, falls, adverse medication reactions, DVT/PE, pressure ulcers and worsening of underlying disease.     Medical Prognosis: Good for continued progress and participation with therapy.     Anticipated Interdisciplinary Rehabilitation Interventions: Activity tolerance: Patient is expected to tolerate minimum of 3 hrs of therapy daily @ 5x/week. Physical therapy to work on mobility. Occupational therapy to work on self care. Recreational therapy for community reintegration and relaxation training. Rehab Nursing to work on medication administration, patient/family education, skin care, fall prevention, bowel and bladder management, vital signs and feeding/nutrition. Weekly interdisciplinary team conference to assess progress and plan of care changes.     Expected Functional Outcomes:  Expected level of improvement/goals for inpatient rehabilitation include modified independence for transfers, modified independence for ambulation and modified independent for self care (ADLs)    SUBJECTIVE:     Reason for Admission: Weakness    History of Present Illness: Summer Russell is a 45 y.o. female  with past medical history of uncontrolled type 2 diabetes, polyneuropathy, hypothyroidism, psoriasis complicated by arthritis, fibromyalgia, and sleep apnea admitted to [facility] 12/20 for severe hyperglycemia and lactic acidosis and nephrotic range proteinuria along with worsening left sided weakness, urinary retention and saddle anesthesia. MRI brain negative and suspect element of DM neuropathy.      Today, patient notes being worried about TOV given ongoing weakness and is. Catheter pulled about an hour ago, no current void yet.   Saddle anethesia overall resolved, ongoing BLE numbness.   Strength improving, left side still weaker.   Having bowel movements.     Pre-Morbid Level of Function:    Prior Function Comments: Pt reports that ~1 year ago she was independent with ADLs and active in the community. Around Oct of 2021 she injured her achilles and was placed on bedrest for 6 weeks, which became 6 months. She began PT after 6 months and recently had to stop 2/2 insurance, but does not feel that she has returned to her baseline functioning. She spends almost all of her time in her bedroom, leaving the house only for doctor's appointments. She endorses many close falls, but only one complete fall in the past year. She uses a w/c for functional mobility outside of the home but is able to furniture walk within her house. Pt's w/c does not fit on their ramp, therefore she walks up/down the ramp to enter/exit the house and uses the railings for stability. She enjoys spending time with her dogs and cats at home     Pre-Admission/Current Level of Function:    ADLs: Needs assistance with ADLs     ADLs - Needs Assistance: Feeding; Grooming; Bathing; Toileting; UB dressing; LB dressing     Feeding - Needs Assistance: Set Up Assist; Performed at bed level     Grooming - Needs Assistance: Performed at Bed level; Set Up Assist     Bathing - Needs Assistance: Mod assist; Max  assist     Toileting - Needs Assistance: Mod assist; Max assist     UB Dressing - Needs Assistance: Min assist; Performed at bed level     LB Dressing - Needs Assistance: Mod assist; Max assist        Mobility:   Bed Mobility: supine to sit with SBA, sit to supine with CGA, increased time for progressing LEs     Transfers: sit<>stand with CGA and RW x3 trials     Gait: All performed with CGA and bariatric RW: side-stepping at EOB x2 ft, seated rest break, stepping in place x12 steps, seated rest break, stepping in place x12 steps        Cognition/Swallow/Speech:  Patient's Vision Adequate to Safely Complete Daily Activities: Yes     Patient's Judgement Adequate to Safely Complete Daily Activities: Yes     Patient's Memory Adequate to Safely Complete Daily Activities: Yes     Patient Able to Express Needs/Desires: Yes     Patient has speech problem: No    Precautions:  Safety Interventions  Safety Interventions: fall reduction program maintained    Medical / Surgical History: Reviewed  Past Medical History:   Diagnosis Date    CKD (chronic kidney disease) 07/25/2021    Diabetes mellitus (CMS-HCC)     Disease of thyroid gland     Fibromyalgia     Hydradenitis     Psoriasis     Psoriatic arthritis (CMS-HCC)     Sleep apnea        Past Surgical History:   Procedure Laterality Date    IR INSERT PORT AGE GREATER THAN 5 YRS  11/06/2019    IR INSERT PORT AGE GREATER THAN 5 YRS 11/06/2019 Soledad Gerlach, MD [facility]    SKIN BIOPSY      TONSILLECTOMY         Social History: Reviewed  Social History     Socioeconomic History    Marital status: Divorced   Tobacco Use    Smoking status: Former     Packs/day: 2.00     Years: 20.00     Pack years: 40.00     Types: Cigarettes     Quit date: 2013     Years since quitting: 9.9    Smokeless tobacco: Never   Substance and Sexual Activity    Alcohol use: No    Drug use: No   Other Topics Concern    Do you use sunscreen? Yes    Tanning bed use? No    Are you easily burned? No    Excessive sun exposure? No    Blistering sunburns? Yes     Social Determinants of Health     Financial Resource Strain: Low Risk     Difficulty of Paying Living Expenses: Not hard at all   Food Insecurity: No Food Insecurity    Worried About Programme researcher, broadcasting/film/video in the Last Year: Never true    Barista in the Last Year: Never true   Transportation Needs: No Transportation Needs    Lack of Transportation (Medical): No    Lack of Transportation (Non-Medical): No       Family History:  Reviewed  family history includes Diabetes in her father and mother; Hypertension in her father and mother. She was adopted.    Allergies: Reviewed  Venom-honey bee, Lamictal [lamotrigine], and Oxycodone    Medications: Reviewed  Current Facility-Administered Medications   Medication Dose Route Frequency Provider  Last Rate Last Admin    acetaminophen (TYLENOL) tablet 1,000 mg  1,000 mg Oral Q8H PRN Marland Mcalpine, MD        aspirin chewable tablet 81 mg  81 mg Oral Daily Marland Mcalpine, MD   81 mg at 08/03/21 1610    atorvastatin (LIPITOR) tablet 20 mg  20 mg Oral Daily Marland Mcalpine, MD   20 mg at 08/03/21 9604    calcium carbonate (TUMS) chewable tablet 200 mg of elem calcium  200 mg of elem calcium Oral TID PRN Marland Mcalpine, MD        cetirizine (ZyrTEC) tablet 10 mg  10 mg Oral Daily Marland Mcalpine, MD   10 mg at 08/03/21 5409    cholecalciferol (vitamin D3 25 mcg (1,000 units)) tablet 25 mcg  25 mcg Oral Daily Marland Mcalpine, MD   25 mcg at 08/03/21 8119    clindamycin (CLEOCIN) capsule 300 mg  300 mg Oral BID Marland Mcalpine, MD        dextrose (D10W) 10% bolus 125 mL  12.5 g Intravenous Q15 Min PRN Marland Mcalpine, MD        diclofenac sodium (VOLTAREN) 1 % gel 2 g  2 g Topical QID Marland Mcalpine, MD        empagliflozin (JARDIANCE) tablet 10 mg  10 mg Oral Daily Marland Mcalpine, MD   10 mg at 08/03/21 1478    enoxaparin (LOVENOX) syringe 40 mg  40 mg Subcutaneous Q12H Marland Mcalpine, MD   40 mg at 08/03/21 2956    fenofibrate (TRICOR) tablet 48 mg  48 mg Oral Daily Marland Mcalpine, MD        finasteride (PROSCAR) tablet 5 mg  5 mg Oral Daily Marland Mcalpine, MD   5 mg at 08/03/21 2130    glucagon injection 1 mg  1 mg Intramuscular Once PRN Marland Mcalpine, MD        glucose chewable tablet 16 g  16 g Oral Q10 Min PRN Marland Mcalpine, MD        guaiFENesin (ROBITUSSIN) oral syrup  100 mg Oral Q4H PRN Marland Mcalpine, MD        hydrOXYzine (ATARAX) tablet 25 mg  25 mg Oral Q6H PRN Marland Mcalpine, MD        insulin glargine (LANTUS) injection 60 Units  60 Units Subcutaneous Nightly Marland Mcalpine, MD        insulin lispro (HumaLOG) injection 0-9 Units  0-9 Units Subcutaneous ACHS Marland Mcalpine, MD   2 Units at 08/03/21 8657    insulin lispro (HumaLOG) injection 10 Units  10 Units Subcutaneous TID [facility] Marland Mcalpine, MD        [START ON 08/04/2021] levothyroxine (SYNTHROID) tablet 50 mcg  50 mcg Oral Once per day on Sun Mon Wed Fri Sat Marland Mcalpine, MD        levothyroxine (SYNTHROID) tablet 75 mcg  75 mcg Oral Once per day on Tue Thu Marland Mcalpine, MD   75 mcg at 08/03/21 8469    lisinopriL (PRINIVIL,ZESTRIL) tablet 40 mg  40 mg Oral Daily Marland Mcalpine, MD   40 mg at 08/03/21 6295    meclizine (ANTIVERT) tablet 25 mg  25 mg Oral TID PRN Marland Mcalpine, MD        nystatin (MYCOSTATIN) powder 1 application  1 application Topical BID Marland Mcalpine, MD  1 application at 08/03/21 1610    prochlorperazine (COMPAZINE) tablet 5 mg  5 mg Oral Q6H PRN Marland Mcalpine, MD        And    ondansetron [facility]) tablet 4 mg  4 mg Oral BID PRN Marland Mcalpine, MD        pantoprazole (PROTONIX) EC tablet 40 mg  40 mg Oral Daily Marland Mcalpine, MD   40 mg at 08/03/21 9604    polyethylene glycol (MIRALAX) packet 17 g  17 g Oral Daily PRN Marland Mcalpine, MD        simethicone [facility]) chewable tablet 80 mg  80 mg Oral TID PRN Marland Mcalpine, MD        traZODone (DESYREL) tablet 50 mg  50 mg Oral Nightly PRN Marland Mcalpine, MD           Review of Systems:    Full 10 systems reviewed and negative, other than as noted in the HPI    OBJECTIVE:     Vitals:  Patient Vitals for the past 8 hrs:   BP Temp Temp src Pulse SpO2 Pulse Resp SpO2 Height Weight   08/03/21 0600 -- -- -- -- -- -- -- -- (!) 163.9 kg (361 lb 5.3 oz)   08/03/21 0151 141/71 36.7 ??C (98.1 ??F) Oral 107 110 18 98 % 167.6 cm (5' 5.98) (!) 163.9 kg (361 lb 5.3 oz)       Physical Exam:  GEN: NAD, sitting up in bed comfortably, obesity, husband present during encounter. EYES: EOMI, EOS, sclera anicteric  ENT: MMM, hearing intact,   CV: warm extremities, well perfused, no cyanosis, mild tachycardia, normal rhythm no mrg  PULM: Normal work of breathing on room air, CTA bilaterally; no wheezing rhonchi, rales  ABD: soft, non distended   Skin: no notable rashes, erythema, or drainage.   NEURO/MSK: CN grossly intact without deficit, BUE coordination on FTN intact, 5/5 strength BUE, decreased sensation to light touch bilateral feet, absent patellar/achilles reflexes bilaterally  RLE: 4 hip flexion, 5 knee extension, 4 dorsiflexion/plantar flexion  LLE: 4- hip flexion, 5 knee extension, 3+ dorsiflexion, 3- plantarflexion  PSYCH: A+Ox3, appropriate      Labs and Diagnostic Studies: Reviewed  CBC:   Recent Labs   Lab Units 08/03/21  0611 08/01/21  0648 07/30/21  0555   WBC 10*9/L 3.8 4.1 3.8   RBC 10*12/L 3.67* 4.02 3.77*   HEMOGLOBIN g/dL 54.0 98.1 19.1   HEMATOCRIT % 33.0* 36.0 33.7*   MCV fL 89.9 89.5 89.4   MCH pg 31.4 30.2 30.7   MCHC g/dL 47.8 29.5 62.1   RDW % 13.4 13.4 13.2   PLATELET COUNT (1) 10*9/L 216 265 221   MPV fL 8.4 8.8 8.5     CMP:   Recent Labs   Lab Units 08/03/21  0611 08/01/21  0648 07/30/21  0555 07/29/21  0714 07/28/21  0607   SODIUM mmol/L 139 141 142 141 140   POTASSIUM mmol/L 3.7 3.6 3.8 3.7 3.7   CHLORIDE mmol/L 104 101 103 104 105   CO2 mmol/L 27.3 28.0 30.0 25.0 25.0   BUN mg/dL 16 16 17 14 13    CREATININE mg/dL 3.08* 6.57* 8.46* 9.62* 1.15*   GLUCOSE mg/dL 952* 841* 324* 401* 027   CALCIUM mg/dL 8.2* 8.6* 8.5* 8.8 8.6*   MAGNESIUM mg/dL 1.8 1.8 1.7 2.0 2.0   PHOSPHORUS mg/dL  --   --  3.9 4.0 4.3  ALBUMIN g/dL  --   --  2.0* 2.3* 2.1*   PROTEIN TOTAL g/dL  --   --  5.5* 5.8 5.5*   BILIRUBIN TOTAL mg/dL  --   --  0.3 0.4 0.3   ALK PHOS U/L  --   --  64 64 57   ALT U/L  --   --  26 26 25    AST U/L  --   --  38* 39* 37*     Radiology Results: Reviewed      This patient is admitted to the Physical Medicine and Rehabilitation - Inpatient - A service from 8am-5pm on weekdays for questions regarding this patient. After hours and on weekends please contact the 1st call resident pager        ATTENDING PHYSICIAN ATTESTATION:     I saw and evaluated the patient, participating in the key portions of the service.  I reviewed the resident's note.  I agree with the resident's findings and plan.     Zenovia Jarred III, MD  Associate Professor  Department of Physical Medicine & Rehabilitation

## 2021-08-03 NOTE — Unmapped (Signed)
A/O x 4, afebrile, VSS on RA, denies pain at this time. IV clean, dry, intact, and flushed per protocol. Foley been removed and pt is able to pee within 6 hours.  Falls precautions maintained. Call bell and bedside table within reach, bed in lowest position and locked. No falls or injuries noted this shift. Will continue with plan of care.    Problem: Adult Inpatient Plan of Care  Goal: Plan of Care Review  Outcome: Progressing  Flowsheets (Taken 08/02/2021 1848)  Progress: improving  Plan of Care Reviewed With: patient  Goal: Patient-Specific Goal (Individualized)  Outcome: Progressing  Flowsheets (Taken 08/02/2021 1848)  Patient-Specific Goals (Include Timeframe): pt will able to pee after foley been removed this shift.  Individualized Care Needs: Monitor labs, VS, BG. Foley removed. Falls precautions  Anxieties, Fears or Concerns: none voiced  Goal: Absence of Hospital-Acquired Illness or Injury  Outcome: Progressing  Intervention: Identify and Manage Fall Risk  Recent Flowsheet Documentation  Taken 08/02/2021 0800 by Lonia Mad, RN  Safety Interventions:   environmental modification   fall reduction program maintained   infection management   lighting adjusted for tasks/safety   low bed   muscle strengthening facilitated   nonskid shoes/slippers when out of bed   family at bedside  Intervention: Prevent and Manage VTE (Venous Thromboembolism) Risk  Recent Flowsheet Documentation  Taken 08/02/2021 0800 by Lonia Mad, RN  Activity Management:   activity adjusted per tolerance   activity encouraged   bedrest  Intervention: Prevent Infection  Recent Flowsheet Documentation  Taken 08/02/2021 0800 by Lonia Mad, RN  Infection Prevention:   environmental surveillance performed   equipment surfaces disinfected   hand hygiene promoted   single patient room provided   rest/sleep promoted  Goal: Optimal Comfort and Wellbeing  Outcome: Progressing  Goal: Readiness for Transition of Care  Outcome: Progressing  Goal: Rounds/Family Conference  Outcome: Progressing     Problem: Impaired Wound Healing  Goal: Optimal Wound Healing  Outcome: Progressing  Intervention: Promote Wound Healing  Recent Flowsheet Documentation  Taken 08/02/2021 0800 by Lonia Mad, RN  Activity Management:   activity adjusted per tolerance   activity encouraged   bedrest     Problem: Self-Care Deficit  Goal: Improved Ability to Complete Activities of Daily Living  Outcome: Progressing     Problem: Skin Injury Risk Increased  Goal: Skin Health and Integrity  Outcome: Progressing  Intervention: Optimize Skin Protection  Recent Flowsheet Documentation  Taken 08/02/2021 0800 by Lonia Mad, RN  Head of Bed [facility]) Positioning:   HOB elevated   HOB at 45 degrees     Problem: Infection  Goal: Absence of Infection Signs and Symptoms  Outcome: Progressing  Intervention: Prevent or Manage Infection  Recent Flowsheet Documentation  Taken 08/02/2021 0800 by Lonia Mad, RN  Infection Management: aseptic technique maintained     Problem: Pain Acute  Goal: Acceptable Pain Control and Functional Ability  Outcome: Progressing     Problem: Hypertension Acute  Goal: Blood Pressure Within Desired Range  Outcome: Progressing

## 2021-08-03 NOTE — Unmapped (Signed)
Family Medicine Inpatient Service  Consult Note    ASSESSMENT/ PLAN:   Summer Russell is a 45 y.o. female with a history of HTN, HLD, GERD, uncontrolled type 2 diabetes, polyneuropathy, hypothyroidism, ??psoriasis complicated by arthritis (on long-standing infliximab infusions), fibromyalgia, and sleep apnea??admitted to [facility] 12/20 for severe hyperglycemia and lactic acidosis and nephrotic range proteinuria along with worsening left sided weakness, urinary retention and saddle anesthesia who is seen in consultation at the request of PMR colleages for evaluation of insulin management for T2DM.     #Poorly controlled IDDM: Patient has long-standing history of poorly controlled T2DM w/ subsequent neuropathy and CKD (G2A3). She states she did not often check her BG at home given pain it caused in her fingertips. HgbA1c 07/25/21 10.9. She previously was followed by Endocrinology in 2020 but that provider left the practice -- she now follows with FNP who previously worked in Endocrinology office. Outpatient diabetes regimen at one time or another included Metformin, Jardiance, long and short-acting Insulin and Trulicity (reports issues w/ adherence d/t once weekly regimen). Current regimen basal 60 units nightly, 10u TIDAC + SSI, and jardiance. On 12/28 patient had total of 34 units short acting insulin, 27 units the day prior. She has not had any BG at goal in the past 6 days.    Total daily dose of insulin (TDDI) based on her morbid obesity and uncontrolled A1c = 0.6 units/kg * 164kg = 98 units daily (though BG were above goal with 94 units on 12/28) so would suggest ~18% increase to start.     Recommendations:  1.Increase Glargine 64 units daily (32 units BID). Basal inulin abovee 50 units should be split BID to have improved absorption for (FBG goal <140). Starting dose 12/29 PM (okay that fasting 12/30 will be high).  2. Increase Lispro 15 units w/ meals (pre-prandial BG goal <180)   3. Change SSI to Resistant SSI 4. Continue Jardiance as SGLT2i is recommended in diabetic patients with CKD.   5. Continue ACEi for similar indication.   6. Consider carb restricted diet   7. Re-check A1c ~3 months  8. Agree with endocrinology referral at discharge  9. Agree with stopping Metformin given CKD  10. Consider addition of GLP1-agonist inpatient vs outpatient for glycemic control and weight loss (patient would prefer daily vs once weekly injections given difficulties in adherence to once weekly previously)  11. Agree with diabetes education      Please page Family Medicine Admission Pager [phone]) for questions or further consult needs.     HPI:  Summer Russell is a 45 y.o. 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      After discussion patient reports she has had long standing DM with poor BG control. She previously saw an Endocrinologist for DM but her provider left the practice. She now sees a FNP who previously worked in an endocrinology office. She states she had difficulty with compliance to medications at home, particularly her once weekly injectable. She did not often check her BG at home given pain in her fingertips that was disabling. Recent A1c 10.9. A1c in 2013 10.  She has subsequent CKD, neuropathy. Agreeable to medication changes.     ROS:  10 point ROS neg except as per HPI    PAST MEDICAL/ SURGICAL HX:  Past Medical History:   Diagnosis Date   ??? CKD (chronic kidney disease) 07/25/2021   ??? Diabetes mellitus (CMS-HCC)    ??? Disease of thyroid gland    ??? Fibromyalgia ??? Hydradenitis    ??? Psoriasis    ??? Psoriatic arthritis (CMS-HCC)    ??? Sleep apnea      Past Surgical History:   Procedure Laterality Date   ??? IR INSERT PORT AGE GREATER THAN 5 YRS  11/06/2019    IR INSERT PORT AGE GREATER THAN 5 YRS 11/06/2019 Soledad Gerlach, MD IMG [facility]   ??? SKIN BIOPSY     ??? TONSILLECTOMY         SOCIAL HX:  Social History     Socioeconomic History   ??? Marital status: Divorced     Spouse name: None   ??? Number of children: None   ??? Years of education: None   ??? Highest education level: None   Tobacco Use   ??? Smoking status: Former     Packs/day: 2.00     Years: 20.00     Pack years: 40.00     Types: Cigarettes     Quit date: 2013     Years since quitting: 9.9   ??? Smokeless tobacco: Never   Substance and Sexual Activity   ??? Alcohol use: No   ??? Drug use: No   Other Topics Concern   ??? Do you use sunscreen? Yes   ??? Tanning bed use? No   ??? Are you easily burned? No   ??? Excessive sun exposure? No   ??? Blistering sunburns? Yes     Social Determinants of Health     Financial Resource Strain: Low Risk    ??? Difficulty of Paying Living Expenses: Not hard at all   Food Insecurity: No Food Insecurity   ??? Worried About Programme researcher, broadcasting/film/video in the Last Year: Never true   ??? Ran Out of Food in the Last Year: Never true   Transportation Needs: No Transportation Needs   ??? Lack of Transportation (Medical): No   ??? Lack of Transportation (Non-Medical): No     Family History   Adopted: Yes   Problem Relation Age of Onset   ??? Diabetes Mother    ??? Hypertension Mother    ??? Diabetes Father    ??? Hypertension Father    ??? Melanoma Neg Hx    ??? Basal cell carcinoma Neg Hx    ??? Squamous cell carcinoma Neg Hx        MEDICATIONS/ ALLERGIES:  No current facility-administered medications on file prior to encounter.     Current Outpatient Medications on File Prior to Encounter   Medication Sig   ??? ascorbic Acid (VITAMIN C) 500 mg CpER Take 500 mg by mouth.   ??? aspirin (ECOTRIN) 81 MG tablet Take 81 mg by mouth daily.   ??? atorvastatin (LIPITOR) 20 MG tablet Take 20 mg by mouth daily.   ??? BD INSULIN SYRINGE ULTRA-FINE 1 mL 31 gauge x 5/16 (8 mm) Syrg USE 2 (TWO) TIMES DAILY AS DIRECTED   ??? blood sugar diagnostic Strp Frequency:PHARMDIR   Dosage:0.0     Instructions:  Note:Use to test blood sugar in the morning before breakfast and in the evening two hours after dinner, ISD-9 250.00.  Dose: 1   ??? blood-glucose meter Misc U QID UTD   ??? cetirizine (ZYRTEC) 10 MG tablet Take 10 mg by mouth daily.   ??? cholecalciferol, vitamin D3, 1,000 unit (25 mcg) tablet Take 1,000 Units by mouth.   ??? clindamycin (CLEOCIN) 300 MG capsule TAKE 1 CAPSULE BY MOUTH TWO TIMES A DAY.   ??? empagliflozin (JARDIANCE) 10 mg tablet Take 1 tablet (10 mg total) by mouth daily.   ??? empty container Misc Use as directed to dispose of Cosentyx pens.   ??? fenofibrate (TRICOR) 48 MG tablet Take 1 tablet (48 mg total) by mouth daily.   ??? finasteride (PROSCAR) 5 mg tablet TAKE 1 TABLET BY MOUTH EVERY DAY   ??? fluconazole (DIFLUCAN) 150 MG tablet TAKE 1 TAB NOW AN THEN TAKE 1 TABLET ONCE WEEKLY AS NEEDED FOR YEAST INFECTION   ??? gabapentin (NEURONTIN) 300 MG capsule Take 1 capsule (300 mg total) by mouth Two (2) times a day.   ??? insulin glargine (LANTUS) 100 unit/mL injection Inject 0.6 mL (60 Units total) under the skin nightly.   ??? insulin lispro (HUMALOG) 100 unit/mL injection Inject 0.1 mL (10 Units total) under the skin Three (3) times a day before meals.   ??? levothyroxine (SYNTHROID, LEVOTHROID) 50 MCG tablet Take by mouth. Taking on Monday Wednesday Friday, Saturday & Sunday and on Tuesday & Thursday   ??? lidocaine (XYLOCAINE) 5 % ointment Apply to painful areas every 3-4 hours as needed   ??? lisinopriL (PRINIVIL,ZESTRIL) 40 MG tablet Take 1 tablet (40 mg total) by mouth daily.   ??? multivitamin capsule Take 1 capsule by mouth daily.   ??? pantoprazole (PROTONIX) 40 MG tablet Take 40 mg by mouth.   ??? pen needle, diabetic 31 gauge x 5/16 Ndle Use 1 needle to skin four times daily as directed   ??? secukinumab (COSENTYX) 150 mg/mL PnIj injection Inject the contents of 2 pens (300 mg total) under the skin every fourteen (14) days as maintenance doses.  START 14 days after last loading dose.     Allergies   Allergen Reactions   ??? [employer] Other (See Comments)   ??? Lamictal [Lamotrigine] Rash   ??? Oxycodone Nausea And Vomiting       PHYSICAL EXAM:  Vitals:    08/03/21 0945   BP: 112/68   Pulse: 112   Resp: 20   Temp: 36.7 ??C (98.1 ??F)   SpO2: 97%       GEN: morbidly obese, well appearing female, sitting up in bed, NAD   Eyes: No scleral icterus. Conjunctiva non-erythematous.  HEENT: NCAT, MMM.  Neck: Supple.  CV: Regular rate and rhythm. No murmurs/rubs/gallops. No cyanosis/clubbing.   Pulm: NWOB on RA. Difficult pulm exam given habitus, no obvious crackles/wheezing.   Abd: obese habitus, NT.   Neuro: A&O x 3. Did not fully assess neurological status.   Ext: No peripheral edema.  Skin: No rashes or skin lesions.      LABS/ STUDIES:   All imaging, laboratory studies, and other pertinent tests including electrocardiography within the last 24 hours are summarized within the assessment and plan.     Ara Kussmaul, MD    August 03, 2021 11:59 AM

## 2021-08-03 NOTE — Unmapped (Signed)
OCCUPATIONAL THERAPY  Evaluation (08/03/21 0801)      Patient Name:  Summer Russell      Medical Record Number: [id number]   Date of Birth: Feb 23, 1976  Gender: Female         Post-Discharge Occupational Therapy Recommendations                   Barriers to Discharge: Home environment barriers, Mobility deficits, Self-Care deficits     Assessment/Session Summary           Assessment: Anarosa Kubisiak is a 45 y.o. female with PMHx of HTN, HLD, GERD, uncontrolled type 2 diabetes, polyneuropathy, hypothyroidism,  psoriasis complicated by arthritis (on long-standing infliximab infusions), fibromyalgia, and sleep apnea admitted to [facility] 12/20 for severe hyperglycemia and lactic acidosis and nephrotic range proteinuria along with worsening left sided weakness, urinary retention and saddle anesthesia.  As a result, pt has suffered impaired ADL/mobility status and would benefit from skilled AIR OT services in order to maximize her level of safety and independence prior to discharge.      Problem List: Impaired sensation, Increased edema, Impaired ADLs, Fall risk, Decreased mobility, Decreased strength                            Today's Interventions: AIR OT Eval    Subjective      Prognosis: Good     Positive Indicators: motivated     Prior Level of Function and Home Situation     Prior functional status: Pt reports that ~1 year ago she was independent with ADLs and active in the community. Around Oct of 2021 she injured her achilles and was placed on bedrest for 6 weeks, which became 6 months. She began PT after 6 months and recently had to stop 2/2 insurance, but does not feel that she has returned to her baseline functioning. She spends almost all of her time in her bedroom, leaving the house only for doctor's appointments. She endorses many close falls, but only one complete fall in the past year. She uses a w/c for functional mobility outside of the home but is able to furniture walk within her house. Pt's w/c does not fit on their ramp, therefore she walks up/down the ramp to enter/exit the house and uses the railings for stability. She enjoys spending time with her dogs and cats at home               Patient / Caregiver reports: I have been in the hospital for a while now.     Living Situation  Living Environment: Mobile home  Lives With: Significant other  Home Living: One level home, Ramped entrance, Tub/shower unit, Walk-in shower, Standard height toilet  Caregiver Identified?: Yes  Caregiver Availability: Nights  Caregiver Ability: Extensive lifting     Equipment available at home: Rollator           Past Medical History:   Diagnosis Date   ??? CKD (chronic kidney disease) 07/25/2021   ??? Diabetes mellitus (CMS-HCC)    ??? Disease of thyroid gland    ??? Fibromyalgia    ??? Hydradenitis    ??? Psoriasis    ??? Psoriatic arthritis (CMS-HCC)    ??? Sleep apnea       Social History     Tobacco Use   ??? Smoking status: Former     Packs/day: 2.00     Years: 20.00  Pack years: 40.00     Types: Cigarettes     Quit date: 2013     Years since quitting: 9.9   ??? Smokeless tobacco: Never   Substance Use Topics   ??? Alcohol use: No     Past Surgical History:   Procedure Laterality Date   ??? IR INSERT PORT AGE GREATER THAN 5 YRS  11/06/2019    IR INSERT PORT AGE GREATER THAN 5 YRS 11/06/2019 Soledad Gerlach, MD IMG [facility]   ??? SKIN BIOPSY     ??? TONSILLECTOMY        Family History   Adopted: Yes   Problem Relation Age of Onset   ??? Diabetes Mother    ??? Hypertension Mother    ??? Diabetes Father    ??? Hypertension Father    ??? Melanoma Neg Hx    ??? Basal cell carcinoma Neg Hx    ??? Squamous cell carcinoma Neg Hx         Venom-honey bee, Lamictal [lamotrigine], and Oxycodone    Objective Findings     Medical Tests / Procedures: reviewed in EPIC     Current Status: Pt received and leftsemi-reclined in bed, with all needs met, call bell within reach, RN aware.     Activity Tolerance: Tolerated treatment well     Precautions: Falls precautions Weight Bearing Status: Non-applicable     Required Braces or Orthoses: Non-applicable     Equipment / Environment: Vascular access (PIV, TLC, Port-a-cath, PICC)       Communication Preference: Verbal     Pain Comments: Pt c/o BLE 4/10 pain, states this is normal                    Cognition  Orientation Level: Oriented x 4  Arousal/Alertness: Appropriate responses to stimuli  Attention Span: Appears intact  Memory: Appears intact  Following Commands: Follows all commands and directions without difficulty  Safety Judgment: Good awareness of safety precautions  Awareness of Errors: Good awareness of errors made  Problem Solving: Able to problem solve independently     Vision  Baseline Vision: No deficits identified  Acute Visual Changes: Blurring of print when reading, Blurring of vision when changing focal distance  Vision Comments: pt has glasses but reports that she doesnt wear them. She endorsed fluctuating vision 2/2 diabetes    Hearing  Hearing: No deficit identified       Hand Function  Right Hand Function: Right hand grip strength, ROM and coordination WNL  Left Hand Function: Left hand grip strength, ROM and coordination WNL  Hand Dominance: Right     Skin Inspection  Skin Inspection: Swelling     UE ROM - Strength  UE ROM/Strength: Left WFL, Right WFL    LE ROM / Strength  LE ROM/Strength: Left Impaired/Limited, Right Impaired/Limited  RLE Impairment: Reduced strength  LLE Impairment: Reduced strength     Coordination  Coordination: WFL     Sensation  RUE Sensation: RUE intact  LUE Sensation: LUE intact  RLE Sensation: RLE impaired  RLE Sensation Impairment: chronic peripheral neuropathy  LLE Sensation: LLE impaired  LLE Sensation Impairment: chronic peripheral neuropathy     Balance: sitting=SBA, standing=min-Mod A + RW     Functional Mobility  Transfer Assistance Needed: Yes  Transfers - Needs Assistance: Max assist, Mod assist  Bed Mobility Assistance Needed: Yes  Bed Mobility - Needs Assistance: Mod assist  GOALS:   Patient and Family Goals: I want to get home to my animals.     Eating   Discharge Goal: Independent  Assistance Needed: Independent  CARE Score - Eating: 6    Oral Hygiene  Discharge Goal: Independent  Assistance Needed: Independent  CARE Score - Oral Hygiene: 6         Toileting Hygiene  Discharge Goal: Partial/moderate assistance  Goal details:: Pt uses a bidet at home for peri-hygiene  Assistance Needed: Physical assistance  Physical Assistance Level: Total assistance  CARE Score - Toileting Hygiene: 1    Toileting Transfer  Discharge Goal: Set-up/clean-up              Shower/Bathe  Discharge Goal: Set-up/clean-up  Assistance Needed: Physical assistance  Physical Assistance Level: 51%-75%  CARE Score - Shower/Bathe Self: 2           UE Dressing  Discharge Goal: Independent  Assistance Needed: Set-up / clean-up  CARE Score - Upper Body Dressing: 5     LE Dressing  Discharge Goal: Set-up/clean-up  Assistance Needed: Physical assistance  Physical Assistance Level: 26%-50%  CARE Score - Lower Body Dressing: 3    Footwear  Discharge Goal: Set-up/clean-up  Assistance Needed: Set-up / clean-up  CARE Score - Putting On/Taking Off Footwear: 5          Short and Long Term Goals  SHORT GOAL #1: Pt will demonstrate good understanding of her BUE HEP.       SHORT GOAL #2: Pt will stnad for >5 min with close S while completing BUE activity.            IP Long Term Goal #1: Prior to d/c, pt will be grossly S/u with all ADLs.       Plan     OT Follow-up / Frequency: 1-2 hours for 5-7x/week     Planned Interventions: Adaptive equipment, ADL retraining, Balance activities, Bed mobility, Compensatory tech. training, Conservation, Education - Patient, Education - Family / caregiver, Endurance activities, Functional mobility, Home exercise program, Safety education, Therapeutic exercise, Transfer training, UE Strength / coordination exercise    OT Individual [mins]: 60      I attest that I have reviewed the above information.  Signed: Vonzell Schlatter, OT  Filed 08/03/2021

## 2021-08-03 NOTE — Unmapped (Signed)
08/03/2021 Lm with pt to call back and schedule VIR Procedure. Pt is currently at [facility] IP transferred to [facility] at [facility]. Will call back.

## 2021-08-03 NOTE — Unmapped (Signed)
PHYSICAL THERAPY  Evaluation (08/03/21 1100)          Patient Name:  Summer Russell       Medical Record Number: [id number]   Date of Birth: 1975-08-20  Gender: Female            Post-Discharge Physical Therapy Recommendations     Post Discharge Recommendations: Home Health     PT DME Recommendations: To Be Determined     Barriers to Discharge: Home environment barriers, Mobility deficits, Self-Care deficits    Assessment     Treatment Diagnosis: Summer Russell is a 45 y.o. 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 12/20 for severe hyperglycemia and lactic acidosis and nephrotic range proteinuria along with worsening left sided weakness, urinary retention and saddle anesthesia. MRI brain negative and suspect element of DM neuropathy. Decreased strength, balance, endurance, mobility.      Assessment : Pt with good potential.  Her ramp is too narrow for her w/c, so she will need to walk up the ramp.     Problem List: Decreased strength, Fall risk, Gait deviation, Impaired ADLs, Pain, Increased edema, Decreased endurance, Decreased mobility, Impaired balance       Personal Factors/Comorbidities Present: 3+   Specific Comorbidities : poly neuropathy, morbid obesity, arthritis   Examination of Body System: 5+ elements   Body System: all     Clinical Decision Making: Moderate    Education: educated on [facility] process and services    Subjective     Prognosis: Good          Positive Indicators: Age, family support, motivation    Prior Level of Function and Home Situation    Prior Functional Status: Patient was ambulatory roughly 1 year ago but tore her achilles and was placed on bedrest for 6 months before starting OP PT. Patient currently unable to take more than 5 steps before needing to sit down, uses WC when she leaves her home but primarily stays in her bedroom. 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     Services patient receives: OT, PT      Patient reports: Pt cooperative w no complaints      Living Situation  Living Environment: Mobile home  Lives With: Spouse (he works second shift 4pm-1am)  Home Living: One level home, Ramped entrance, Tub/shower unit, Walk-in shower, Standard height toilet (ramp is too narrow for w/c- she will need to walk up the ramp)  Caregiver Identified?: Yes  Caregiver Availability: Intermittant  Caregiver Ability: Extensive lifting     Equipment available at home: Rollator, Wheelchair-manual     Orthostatics: Asymptomatic      Past Medical History:   Diagnosis Date   ??? CKD (chronic kidney disease) 07/25/2021   ??? Diabetes mellitus (CMS-HCC)    ??? Disease of thyroid gland    ??? Fibromyalgia    ??? Hydradenitis    ??? Psoriasis    ??? Psoriatic arthritis (CMS-HCC)    ??? Sleep apnea             Social History     Tobacco Use   ??? Smoking status: Former     Packs/day: 2.00     Years: 20.00     Pack years: 40.00  Types: Cigarettes     Quit date: 2013     Years since quitting: 9.9   ??? Smokeless tobacco: Never   Substance Use Topics   ??? Alcohol use: No       Past Surgical History:   Procedure Laterality Date   ??? IR INSERT PORT AGE GREATER THAN 5 YRS  11/06/2019    IR INSERT PORT AGE GREATER THAN 5 YRS 11/06/2019 Soledad Gerlach, MD [facility]   ??? SKIN BIOPSY     ??? TONSILLECTOMY               Family History   Adopted: Yes   Problem Relation Age of Onset   ??? Diabetes Mother    ??? Hypertension Mother    ??? Diabetes Father    ??? Hypertension Father    ??? Melanoma Neg Hx    ??? Basal cell carcinoma Neg Hx    ??? Squamous cell carcinoma Neg Hx         Allergies: Venom-honey bee, Lamictal [lamotrigine], and Oxycodone      Objective Findings          Activity Tolerance: Tolerated treatment well     Precautions / Restrictions  Precautions: Falls precautions  Weight Bearing Status: Non-applicable  Required Braces or Orthoses: Non-applicable    Weight Bearing Status: Non-applicable      Required Braces or Orthoses: Non-applicable    Equipment / Environment: Caregiver wearing mask for full session, Patient not wearing mask for full session     Communication Preference: Verbal              Pain Comments: 4/10 head ache pain      Medical Tests / Procedures: via epic                        Cognition: WFL  Orientation: Oriented x4            Skin Inspection comment: per EPIC, visual skiin intact- pt clothed      Upper Extremities  UE ROM: Right WFL, Left WFL  UE Strength: Right WFL, Left WFL       Lower Extremities  LE comment: some decreased arom and prom due to body habitus, Pt was able to move BLEs against gravity       Sensation comment: Peripheral neuropathy in both feet  Balance comment: min assist with rw while standing       Endurance: decreased from baseline       Goals:   Patient and Family Goals: I want to actually walk again.    Roll Left to Right  Discharge Goal: Independent  Assistance Needed: Physical assistance  Physical Assistance Level: 25% or less  CARE Score - Roll Left and Right: 3    Sit to Lying  Discharge Goal: Independent  Assistance Needed: Physical assistance  Physical Assistance Level: 26%-50%  Comment: assist with BLEs  CARE Score - Sit to Lying: 3    Lying to Sitting  Discharge Goal: Independent  Assistance Needed: Physical assistance  Physical Assistance Level: 25% or less  CARE Score - Lying to Sitting on Side of Bed: 3         Sit to Stand  Discharge Goal: Independent  Assistance Needed: Physical assistance  Physical Assistance Level: 25% or less  Comment: min assist sit to stand to sit with rw from bed and w/c  CARE Score - Sit to Stand: 3    Chair  to Chair/Bed  Discharge Goal: Independent  Assistance Needed: Physical assistance  Physical Assistance Level: 25% or less  CARE Score - Chair/Bed-to-Chair Transfer: 3         Car Transfer  Discharge Goal: Supervision or touching assistance  Reason if not Attempted: Safety concerns  CARE Score - Car Transfer: 88     Pick Up Objects  Discharge Goal: Not attempted due to medical/safety concerns  Reason if not Attempted: Safety concerns  CARE Score - Picking Up Object: 88           Walk 10 Feet   Discharge Goal: Supervision or touching assistance  Comment: 66ft rw min assist  Reason if not Attempted: Safety concerns  CARE Score - Walk 10 Feet: 88    Walk 50 Feet with Turns  Discharge Goal: Partial/moderate assistance  Reason if not Attempted: Safety concerns  CARE Score - Walk 50 Feet with Two Turns: 88    Walk 150 Feet  Discharge Goal: Not attempted due to medical/safety concerns  Reason if not Attempted: Safety concerns  CARE Score - Walk 150 Feet: 88    Walk 10 Feet on Uneven Surface  Discharge Goal: Partial/moderate assistance  Reason if not Attempted: Safety concerns  CARE Score - Walking 10 Feet on Uneven Surfaces: 88           1 Step (Curb)  Discharge Goal: Not attempted due to medical/safety concerns  Reason if not Attempted: Safety concerns  CARE Score - 1 Step (Curb): 88    4 Steps  Discharge Goal: Not attempted due to medical/safety concerns  Reason if not Attempted: Safety concerns  CARE Score - 4 Steps: 88    12 Steps  Discharge Goal: Not attempted due to medical/safety concerns  Reason if not Attempted: Safety concerns  CARE Score - 12 Steps: 88           Wheel 50 Feet with Two Turns  Discharge Goal: Independent  Reason if not Attempted: Safety concerns  CARE Score - Wheel 50 Feet with Two Turns: 88  Type of Wheelchair/Scooter: Manual    Wheel 150 Feet  Discharge Goal: Independent  Reason if not Attempted: Safety concerns  CARE Score - Wheel 150 Feet: 88  Type of Wheelchair/Scooter: Manual           Short and Long Term Goals  SHORT GOAL #1: ------  SHORT GOAL #2: ------  Long Term Goal #1: Pt will be supervision with all home mobility with lrad in 3 weeks        Plan     PT Follow-up / Frequency: 1-2 hours for 5-7x/week     Planned Interventions: Balance activities, Endurance activities, Education - Patient, Education - Family / caregiver, Investment banker, operational, Home exercise program, Functional mobility, Transfer training, Therapeutic exercise, Stair training, Self-care / Home training, Therapeutic activity     PT Individual [mins]: 60      I attest that I have reviewed the above information.  Signed: Felipa Evener, PT  Filed 08/03/2021

## 2021-08-04 LAB — THSD7A AB: THSD7A AB: NEGATIVE

## 2021-08-04 MED ADMIN — finasteride (PROSCAR) tablet 5 mg: 5 mg | ORAL | @ 13:00:00

## 2021-08-04 MED ADMIN — empagliflozin (JARDIANCE) tablet 10 mg: 10 mg | ORAL | @ 13:00:00

## 2021-08-04 MED ADMIN — insulin lispro (HumaLOG) injection 15 Units: 15 [IU] | SUBCUTANEOUS | @ 13:00:00

## 2021-08-04 MED ADMIN — fenofibrate (TRICOR) tablet 48 mg: 48 mg | ORAL | @ 13:00:00

## 2021-08-04 MED ADMIN — insulin glargine (LANTUS) injection 32 Units: 32 [IU] | SUBCUTANEOUS | @ 02:00:00

## 2021-08-04 MED ADMIN — lisinopriL (PRINIVIL,ZESTRIL) tablet 40 mg: 40 mg | ORAL | @ 13:00:00

## 2021-08-04 MED ADMIN — enoxaparin (LOVENOX) syringe 40 mg: 40 mg | SUBCUTANEOUS | @ 02:00:00

## 2021-08-04 MED ADMIN — cholecalciferol (vitamin D3 25 mcg (1,000 units)) tablet 25 mcg: 25 ug | ORAL | @ 13:00:00

## 2021-08-04 MED ADMIN — levothyroxine (SYNTHROID) tablet 50 mcg: 50 ug | ORAL | @ 12:00:00

## 2021-08-04 MED ADMIN — nystatin (MYCOSTATIN) powder 1 application: 1 | TOPICAL | @ 13:00:00

## 2021-08-04 MED ADMIN — clindamycin (CLEOCIN) capsule 300 mg: 300 mg | ORAL | @ 13:00:00 | Stop: 2021-08-08

## 2021-08-04 MED ADMIN — insulin glargine (LANTUS) injection 32 Units: 32 [IU] | SUBCUTANEOUS | @ 14:00:00

## 2021-08-04 MED ADMIN — insulin lispro (HumaLOG) injection 0-20 Units: 0-20 [IU] | SUBCUTANEOUS | @ 02:00:00

## 2021-08-04 MED ADMIN — insulin lispro (HumaLOG) injection 15 Units: 15 [IU] | SUBCUTANEOUS

## 2021-08-04 MED ADMIN — pantoprazole (PROTONIX) EC tablet 40 mg: 40 mg | ORAL | @ 13:00:00

## 2021-08-04 MED ADMIN — insulin lispro (HumaLOG) injection 0-20 Units: 0-20 [IU] | SUBCUTANEOUS | @ 23:00:00

## 2021-08-04 MED ADMIN — insulin lispro (HumaLOG) injection 15 Units: 15 [IU] | SUBCUTANEOUS | @ 18:00:00

## 2021-08-04 MED ADMIN — nystatin (MYCOSTATIN) powder 1 application: 1 | TOPICAL | @ 02:00:00

## 2021-08-04 MED ADMIN — clindamycin (CLEOCIN) capsule 300 mg: 300 mg | ORAL | @ 02:00:00 | Stop: 2021-08-08

## 2021-08-04 MED ADMIN — insulin lispro (HumaLOG) injection 15 Units: 15 [IU] | SUBCUTANEOUS | @ 23:00:00

## 2021-08-04 MED ADMIN — aspirin chewable tablet 81 mg: 81 mg | ORAL | @ 13:00:00

## 2021-08-04 MED ADMIN — insulin lispro (HumaLOG) injection 0-20 Units: 0-20 [IU] | SUBCUTANEOUS | @ 12:00:00

## 2021-08-04 MED ADMIN — atorvastatin (LIPITOR) tablet 20 mg: 20 mg | ORAL | @ 13:00:00

## 2021-08-04 MED ADMIN — enoxaparin (LOVENOX) syringe 40 mg: 40 mg | SUBCUTANEOUS | @ 13:00:00

## 2021-08-04 MED ADMIN — insulin lispro (HumaLOG) injection 0-20 Units: 0-20 [IU] | SUBCUTANEOUS | @ 18:00:00

## 2021-08-04 MED ADMIN — cetirizine (ZyrTEC) tablet 10 mg: 10 mg | ORAL | @ 13:00:00

## 2021-08-04 NOTE — Unmapped (Signed)
Family Medicine Inpatient Service  Brief Consult Note    ASSESSMENT/ PLAN:   Summer Russell is a 45 y.o. 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     #Poorly controlled IDDM: Fasting BG above goal as expected iso splitting basal insulin. Would not change insulin regimen today. Continue to monitor blood sugars for additional day. Family medicine will continue to monitor BG and make recommendations accordingly.     Recommendations:  1. Continue Glargine 64 units daily (32 units BID).   2. Continue Lispro 15 units w/ meals   3. Continue Resistant SSI   4. Continue Jardiance as SGLT2i is recommended in diabetic patients with CKD.   5. Continue ACEi for similar indication.   6. Consider carb restricted diet   7. Re-check A1c ~3 months  8. Agree with endocrinology referral at discharge  9. Agree with stopping Metformin given CKD  10. Consider addition of GLP1-agonist inpatient vs outpatient for glycemic control and weight loss (patient would prefer daily vs once weekly injections given difficulties in adherence to once weekly previously)  11. Agree with diabetes education      Please page Family Medicine Admission Pager [phone]) for questions or further consult needs.     HPI:  Summer Russell is a 45 y.o. 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     Did not visit patient today. Performed chart review only.     MEDICATIONS/ ALLERGIES:  No current facility-administered medications on file prior to encounter.     Current Outpatient Medications on File Prior to Encounter   Medication Sig   ??? ascorbic Acid (VITAMIN C) 500 mg CpER Take 500 mg by mouth.   ??? aspirin (ECOTRIN) 81 MG tablet Take 81 mg by mouth daily.   ??? atorvastatin (LIPITOR) 20 MG tablet Take 20 mg by mouth daily.   ??? BD INSULIN SYRINGE ULTRA-FINE 1 mL 31 gauge x 5/16 (8 mm) Syrg USE 2 (TWO) TIMES DAILY AS DIRECTED   ??? blood sugar diagnostic Strp Frequency:PHARMDIR   Dosage:0.0     Instructions:  Note:Use to test blood sugar in the morning before breakfast and in the evening two hours after dinner, ISD-9 250.00. Dose: 1   ??? blood-glucose meter Misc U QID UTD   ??? cetirizine (ZYRTEC) 10 MG tablet Take 10 mg by mouth daily.   ??? cholecalciferol, vitamin D3, 1,000 unit (25 mcg) tablet Take 1,000 Units by mouth.   ??? clindamycin (CLEOCIN) 300 MG capsule TAKE 1 CAPSULE BY MOUTH TWO TIMES A DAY.   ??? empagliflozin (JARDIANCE) 10 mg tablet Take 1 tablet (10 mg total) by mouth daily.   ??? empty container Misc Use as directed to dispose of Cosentyx pens.   ??? fenofibrate (TRICOR) 48 MG tablet Take 1 tablet (48 mg total) by mouth daily.   ??? finasteride (PROSCAR) 5 mg tablet TAKE 1 TABLET BY MOUTH EVERY DAY   ??? fluconazole (DIFLUCAN) 150 MG tablet TAKE 1 TAB  NOW AN THEN TAKE 1 TABLET ONCE WEEKLY AS NEEDED FOR YEAST INFECTION   ??? gabapentin (NEURONTIN) 300 MG capsule Take 1 capsule (300 mg total) by mouth Two (2) times a day.   ??? insulin glargine (LANTUS) 100 unit/mL injection Inject 0.6 mL (60 Units total) under the skin nightly.   ??? insulin lispro (HUMALOG) 100 unit/mL injection Inject 0.1 mL (10 Units total) under the skin Three (3) times a day before meals.   ??? levothyroxine (SYNTHROID, LEVOTHROID) 50 MCG tablet Take by mouth. Taking on Monday Wednesday Friday, Saturday & Sunday and on Tuesday & Thursday   ??? lidocaine (XYLOCAINE) 5 % ointment Apply to painful areas every 3-4 hours as needed   ??? lisinopriL (PRINIVIL,ZESTRIL) 40 MG tablet Take 1 tablet (40 mg total) by mouth daily.   ??? multivitamin capsule Take 1 capsule by mouth daily.   ??? pantoprazole (PROTONIX) 40 MG tablet Take 40 mg by mouth.   ??? pen needle, diabetic 31 gauge x 5/16 Ndle Use 1 needle to skin four times daily as directed   ??? secukinumab (COSENTYX) 150 mg/mL PnIj injection Inject the contents of 2 pens (300 mg total) under the skin every fourteen (14) days as maintenance doses.  START 14 days after last loading dose.     Allergies   Allergen Reactions   ??? [employer] Other (See Comments)   ??? Lamictal [Lamotrigine] Rash   ??? Oxycodone Nausea And Vomiting       PHYSICAL EXAM:  Vitals:    08/04/21 0937   BP: 137/74   Pulse: 101   Resp: 18   Temp: 36.7 ??C (98.1 ??F)   SpO2: 97%       Physical exam not performed. Recommendations made by chart review only.     Ara Kussmaul, MD    August 04, 2021 10:57 AM

## 2021-08-04 NOTE — Unmapped (Signed)
Patient alert and oriented x 4, continent of bowel and bladder uses bed side commode. Blood sugar done and  insulin  administered as per order. Denies pain. Port cath intact flushes well dressing clean. No acute changes in the shift. Spouse in the room. Call bell and telephone within reach. Will give report to next shift.        Problem: Rehabilitation (IRF) Plan of Care  Goal: Plan of Care Review  Outcome: Progressing  Goal: Patient-Specific Goal (Individualized)  Outcome: Progressing  Goal: Absence of New-Onset Illness or Injury  Outcome: Progressing  Intervention: Prevent Fall and Fall Injury  Recent Flowsheet Documentation  Taken 08/04/2021 0200 by Rudell Cobb, RN  Safety Interventions: fall reduction program maintained  Taken 08/04/2021 0000 by Rudell Cobb, RN  Safety Interventions: aspiration precautions  Taken 08/03/2021 2200 by Rudell Cobb, RN  Safety Interventions: aspiration precautions  Taken 08/03/2021 2000 by Rudell Cobb, RN  Safety Interventions: aspiration precautions  Intervention: Prevent Infection  Recent Flowsheet Documentation  Taken 08/04/2021 0200 by Rudell Cobb, RN  Infection Prevention: hand hygiene promoted  Taken 08/03/2021 2200 by Rudell Cobb, RN  Infection Prevention:   hand hygiene promoted   rest/sleep promoted  Taken 08/03/2021 2000 by Rudell Cobb, RN  Infection Prevention:   hand hygiene promoted   rest/sleep promoted  Intervention: Prevent VTE (Venous Thromboembolism)  Recent Flowsheet Documentation  Taken 08/03/2021 2000 by Rudell Cobb, RN  VTE Prevention/Management: anticoagulant therapy  Goal: Optimal Comfort and Wellbeing  Outcome: Progressing  Goal: Home and Community Transition Plan Established  Outcome: Progressing     Problem: Impaired Wound Healing  Goal: Optimal Wound Healing  Outcome: Progressing     Problem: Self-Care Deficit  Goal: Improved Ability to Complete Activities of Daily Living  Outcome: Progressing  Intervention: Promote Activity and Functional Independence  Recent Flowsheet Documentation  Taken 08/03/2021 2000 by Rudell Cobb, RN  Self-Care Promotion: BADL personal objects within reach

## 2021-08-04 NOTE — Unmapped (Signed)
Care Management  Initial Transition Planning Assessment  Patient's discharge address and phone number verified as correct in demographics. Number home levels 1 and entrance steps 0, ramps at entrance. Pt lives with SO. Current HH is setup in the home: No. Current DME in home:  WC, rollator, ordered new cane. SO is this patient's Insurance risk surveyor. Pt will need a ride home as she and SO don't have car.      CM met with patient in pt room.  Pt/visitors were not wearing hospital provided masks for the duration of the interaction with CM.   CM was wearing hospital provided surgical mask and hospital provided eye protection.  CM was not within 6 foot of the patient/visitors during this interaction.      Type of Residence: Mailing Address:  [address]  [place] [address]  Contacts:    Patient Phone Number: [phone] (home)         Medical Provider(s): [facility]  Reason for Admission: Admitting Diagnosis:  L sided weakness  Past Medical History:   has a past medical history of CKD (chronic kidney disease) (07/25/2021), Diabetes mellitus (CMS-HCC), Disease of thyroid gland, Fibromyalgia, Hydradenitis, Psoriasis, Psoriatic arthritis (CMS-HCC), and Sleep apnea.  Past Surgical History:   has a past surgical history that includes Tonsillectomy; IR Insert Port Age Greater Than 5 Years (11/06/2019); and Skin biopsy.   Previous admit date: 07/25/2021    Primary Insurance- Payor: [place] [employer] [place] / Plan: [place] [employer] [place] / Product Type: *No Product type* /   Secondary Insurance - None  Prescription Coverage - yes  Preferred Pharmacy - [id number] [place], [place] - [address]  [facility]    Transportation home: Private vehicle                 General  Care Manager assessed the patient by : In person interview with patient, In person interview with family, Medical record review, Discussion with Clinical Care team  Orientation Level: Oriented X4  Functional level prior to admission: Independent  Reason for referral: Discharge Planning    Contact/Decision Maker  Extended Emergency Contact Information  Primary Emergency Contact: Pegg,Derek   [place]  Home Phone: [phone]  Relation: Other    Legal Next of Kin / Guardian / POA / Advance Directives       Advance Directive (Medical Treatment)  Does patient have an advance directive covering medical treatment?: Patient does not have advance directive covering medical treatment.    Health Care Decision Maker [HCDM] (Medical & Mental Health Treatment)  Healthcare Decision Maker: Patient needs follow-up to appoint a Health Care Decision Maker.  Information offered on HCDM, Medical & Mental Health advance directives:: Patient given information.         Readmission Information    Have you been hospitalized in the last 30 days?: Yes  Name of Hospital: [facility]  Were you being cared for at a skilled nursing facility:: No     What day were you discharged from that hospital or facility?: 08/03/21  Number of Days between previous discharge and readmission date: 1-3 days    Type of Readmission: Planned Readmission            Did the following happen with your discharge?  Patient Information  Lives with: Spouse/significant other    Type of Residence: Private residence        Location/Detail: 1 level home, 2 ramps at entrance    Support Systems/Concerns: Significant Other    Responsibilities/Dependents at home?: No    Home Care services in place prior to admission?: No                  Equipment Currently Used at Home: wheelchair, manual, other (see comments) (rollator)       Currently receiving outpatient dialysis?: No       Financial Information       Need for financial assistance?: No       Social Determinants of Health  Social Determinants of Health were addressed in provider documentation.  Please refer to patient history.  Social Determinants of Health     Food Insecurity: No Food Insecurity   ??? Worried About Programme researcher, broadcasting/film/video in the Last Year: Never true   ??? Ran Out of Food in the Last Year: Never true   Tobacco Use: Medium Risk   ??? Smoking Tobacco Use: Former   ??? Smokeless Tobacco Use: Never   ??? Passive Exposure: Not on file   Transportation Needs: Unmet Transportation Needs   ??? Lack of Transportation (Medical): No   ??? Lack of Transportation (Non-Medical): Yes   Alcohol Use: Not on file   Housing/Utilities: Low Risk    ??? Within the past 12 months, have you ever stayed: outside, in a car, in a tent, in an overnight shelter, or temporarily in someone else's home (i.e. couch-surfing)?: No   ??? Are you worried about losing your housing?: No   ??? Within the past 12 months, have you been unable to get utilities (heat, electricity) when it was really needed?: No   Substance Use: Not on file   Financial Resource Strain: Low Risk    ??? Difficulty of Paying Living Expenses: Not hard at all   Physical Activity: Not on file   Health Literacy: Low Risk    ??? : Never   Stress: Not on file   Intimate Partner Violence: Not on file   Depression: Not on file   Social Connections: Not on file       Complex Discharge Information    Is patient identified as a difficult/complex discharge?: No                                                               Interventions:       Discharge Needs Assessment  Concerns to be Addressed: discharge planning    Clinical Risk Factors: New Diagnosis, Functional Limitations    Barriers to taking medications: No                             Discharge Facility/Level of Care Needs:      Readmission  Risk of Unplanned Readmission Score: UNPLANNED READMISSION SCORE: 22%  Predictive Model Details          22%  Factor Value    Calculated 08/03/2021 16:02 34% Number of active Rx orders 55    [place] Risk of Unplanned Readmission Model 9% Active antipsychotic Rx order present  8% ECG/EKG order present in last 6 months     8% Latest calcium low (8.2 mg/dL)     7% Diagnosis of electrolyte disorder present     6% Imaging order present in last 6 months     5% Number of ED visits in last six months 1     4% Number of hospitalizations in last year 1     4% Active anticoagulant Rx order present     4% Latest creatinine high (0.88 mg/dL)     3% Diagnosis of renal failure present     3% Age 71     2% Future appointment scheduled     1% Charlson Comorbidity Index 1     1% Active ulcer medication Rx order present     1% Current length of stay 0.601 days      Readmitted Within the Last 30 Days? (No if blank) Yes  Patient at risk for readmission?: Yes    Discharge Plan  Screen findings are: Discharge planning needs identified or anticipated (Comment).    Expected Discharge Date:     Expected Transfer from Critical Care:  (n/a)    Quality data for continuing care services shared with patient and/or representative?: Yes  Patient and/or family were provided with choice of facilities / services that are available and appropriate to meet post hospital care needs?: Yes   List choices in order highest to lowest preferred, if applicable. : no preference    Initial Assessment complete?: Yes

## 2021-08-04 NOTE — Unmapped (Signed)
Physical Medicine and Rehabilitation  Daily Progress Note [facility]    ASSESSMENT:     Summer Russell is a 45 y.o. 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  ??  Impairment Group Code: (Neurologic Conditions) 03.3 Polyneuropathy    PLAN:     REHAB:   - PT and OT to maximize functional status with mobility and ADLs as well as prevention of joint contracture   - RT for community re-integration, relaxation, and Support Group  - P&O for assistive devices prn  - Pharmacy consult for patient and family education on medication management   - Patient will be discussed at next interdisciplinary team conference    ??  Lower extremity weakness/Sensory deficits: MRI brain negative (12/24). Patient unable to tolerate close spaces so spine MRI not obtainable. Patient may have lumbosacral plexopathy (such as diabetic amyotrophy vs polyneuropathy) in setting of known neuropathy, so EMG/NCS could be considered, however due to body habitus, may be more challenging to perform. Deferred EMG inpatient as symptoms improved with better glucose control.  [ ]  Plan for outpatient referral to neurology to consider outpatient EMG/NCS  Urinary retention:  - Foley removed for TOV on 12/28 prior to admission  - If failed void, recommend replacing foley with repeat bladder rest x7 days.   Prophylaxis:  - Lovenox 40 mg BID (weight based)  Pain:  - Tylenol 1000 mg TID prn  ??  Hyperglycemia - Uncontrolled T2DM with insulin dependence: BG elevated to >400 during acute hospitalization (no AG or acidemia to suggest DKA).   - inpatient consult to family medicine for insulin titration/comangement, appreciate recs  - inpatient consult to diabetes education  - Jardiance 10 mg daily??  -??glargine 32U BID  - lispro 15U TIDAC  - resistant SSI  - Holding metformin  [ ]  plan for outpatient referral to [facility] endocrinology  ??  Nephrotic range proteinuria: Nephrology consulted during acute hospitalization due to AKI, proteinuria and elevated lactate. Suspect due to??diabetic nephropathy, but also sent additional labs (infectious studies, autoimmune work-up) for further work-up.  - Continue lisinopril 40 mg daily and empagliflozin 10 mg daily as tolerated for proteinuria  - HOLD home OCP (ethinyl estradiol/drospirenone), given hypercoagulability associated with nephrotic syndrome per pharmacy recommendation.   [ ]  consider restarting bumex 1mg  BID if urine output decreases or swelling increases  [ ]  Consider rescheduling outpatient nephrology follow-up appointment pending rehab disposition (currently scheduled on 08/11/2020)  [ ]  plan to coordinate outpatient kidney biopsy with VIR prior to dischareg  ??  Hydradenitis suppurativa - Psoriasis: Managed by dermatology outpatient; on IV Renflexis, finasteride, and clindamycin.??S/p Renflexis infusion 12/19  - Continue home clindamycin 300 mg BID and finasteride 5 mg daily  ??  Vertigo/nausea: zyrtec 10 mg daily, compazine (first line), Zofran (second line), meclizine prn,   Hypothyroidism??- Continue home Synthroid 50 mcg every day + 25 mcg T/Th  HTN - Lisinopril and Bumex as above   HLD - Continue home Lipitor 20 mg daily, fenofibrate 48 mg daily, ASA 81 mg daily  Sleep Apnea - Does not tolerate CPAP at bedtime  GERD??- Continue home protonix 40 mg daily  ??  DISPO: Admitted to Rehab floor, patient will be discussed at next interdisciplinary team conference   EDD: 1/10  Follow up: PCP, neurology referral, nephrology, endocrinology referral       SUBJECTIVE:     Interval Events: NAEON. Patient did well overnight and states therapy evaluations went well. Continuing current insulin regimen today per family medicine recommendations. Patient reports some morning indigestion after taking morning medications and states that pepto has been helpful in the past at home, so added this as a PRN medication.    OBJECTIVE:     Vital signs (last 24 hours):  Temp:  [36.6 ??C (97.9 ??F)-36.8 ??C (98.2 ??F)] 36.7 ??C (98.1 ??F)  Heart Rate:  [96-106] 101  Resp:  [18] 18  BP: (130-140)/(57-91) 137/74  MAP (mmHg):  [76] 76  SpO2:  [97 %-98 %] 97 %    Intake/Output (last 3 shifts):  I/O last 3 completed shifts:  In: 410 [P.O.:400; I.V.:10]  Out: -     Physical Exam:  GEN: sitting up in bed  EYES: sclera anicteric, conjunctiva clear   HENT: NCAT  NECK: trachea midline  RESP: NWOB on RA  SKIN: no visible rashes  MSK: no notable contractures  NEURO: Awake and alert, answers questions appropriately    Medications:  Scheduled   ??? aspirin chewable tablet 81 mg Daily   ??? atorvastatin (LIPITOR) tablet 20 mg Daily   ??? cetirizine (ZyrTEC) tablet 10 mg Daily   ??? cholecalciferol (vitamin D3 25 mcg (1,000 units)) tablet 25 mcg Daily   ??? clindamycin (CLEOCIN) capsule 300 mg BID   ??? diclofenac sodium (VOLTAREN) 1 % gel 2 g QID   ??? empagliflozin (JARDIANCE) tablet 10 mg Daily   ??? enoxaparin (LOVENOX) syringe 40 mg Q12H   ??? fenofibrate (TRICOR) tablet 48 mg Daily   ??? finasteride (PROSCAR) tablet 5 mg Daily   ??? insulin glargine (LANTUS) injection 32 Units BID   ??? insulin lispro (HumaLOG) injection 0-20 Units ACHS   ??? insulin lispro (HumaLOG) injection 15 Units TID AC   ??? levothyroxine (SYNTHROID) tablet 50 mcg Once per day on Sun Mon Wed Fri Sat   ??? levothyroxine (SYNTHROID) tablet 75 mcg Once per day on Tue Thu   ??? lisinopriL (PRINIVIL,ZESTRIL) tablet 40 mg Daily   ??? nystatin (MYCOSTATIN) powder 1 application BID   ??? pantoprazole (PROTONIX) EC tablet 40 mg Daily     PRN acetaminophen, 1,000 mg, Q8H PRN  bismuth subsalicylate, 30 mL, Q6H PRN  calcium carbonate, 200 mg of elem calcium, TID PRN  dextrose, 12.5 g, Q15 Min PRN  glucagon, 1 mg, Once PRN  glucose, 16 g, Q10 Min PRN  guaiFENesin, 100 mg, Q4H PRN  hydrOXYzine, 25 mg, Q6H PRN  meclizine, 25 mg, TID PRN  prochlorperazine, 5 mg, Q6H PRN   And  ondansetron, 4 mg, BID PRN  polyethylene glycol, 17 g, Daily PRN  simethicone, 80 mg, TID PRN  traZODone, 50 mg, Nightly PRN        Labs/Studies: Reviewed.    Radiology Results: Reviewed.

## 2021-08-04 NOTE — Unmapped (Signed)
Physical Medicine and Rehabilitation  Individualized Overall Plan of Care  08/04/2021 7:49 AM     Patient Name: Summer Russell   Medical Record Number: [id number]   Date of Birth: May 24, 1976   Sex: Female   Room/Bed: 3H206/3H206-01     Primary Impairment Group:     (Neurologic Conditions) 03.3 Polyneuropathy   Admit Date/Time: 08/03/2021  1:38 AM     Physician Summary:   Summer Russell is a 45 y.o. female with past medical history of uncontrolled type 2 diabetes, polyneuropathy, hypothyroidism, psoriasis complicated by arthritis, fibromyalgia, and sleep apnea admitted to [facility] 12/20 for severe hyperglycemia and lactic acidosis and nephrotic range proteinuria along with worsening left sided weakness, urinary retention and saddle anesthesia. MRI brain negative and suspect element of DM neuropathy. Stable and awaiting rehab placement. Summer Russell has participated in acute inpatient physical and occupational therapies to improve functional mobility, activity tolerance, functional strength, balance, and endurance in order to facilitate safe performance of ADLs and daily routines.  Summer Russell has been referred to [facility] for continued acute medical management, provision of intensive inpatient therapies, and patient/family training to facilitate safe performance of ADLs and mobility, prior to discharge home.     Medical Necessity:  The patient requires acute inpatient rehabilitation to maximize functional independence and requires daily physician visits for monitoring/management of nephrotic syndrome, vital signs, anticoagulation, medications, skin and pain control. This patient's rehabilitation goals and medical complexity could not adequately be managed in a less intensive setting.  Potential risks for clinical complications include kidney failure, falls, adverse medication reactions, DVT/PE, pressure ulcers and worsening of underlying disease.  ??  Medical Prognosis: Good for continued progress and participation with therapy.  ??  Anticipated Interdisciplinary Rehabilitation Interventions: Activity tolerance: Patient is expected to tolerate minimum of 3 hrs of therapy daily @ 5x/week. Physical therapy to work on mobility. Occupational therapy to work on self care. Recreational therapy for community reintegration and relaxation training. Rehab Nursing to work on medication administration, patient/family education, skin care, fall prevention, bowel and bladder management, vital signs and feeding/nutrition. Weekly interdisciplinary team conference to assess progress and plan of care changes.  ??  Expected Functional Outcomes:  Expected level of improvement/goals for inpatient rehabilitation include modified independence for transfers, modified independence for ambulation and modified independent for self care (ADLs)    Rehab nursing is required to help manage the patient???s complex nursing needs related to their documented medical conditions.     The patient's medical prognosis is Good  to achieve the stated goals below and to be able to be discharged home with family assistance or supervision within the estimated length of stay of  10-14 days.     Patient and Family Goals     ADL: I want to get home to my animals.  Mobility: I want to actually walk again.  Community Reintegration:       Quality Indicators - Goals     Eating Discharge Goal  Discharge Goal: Independent     Oral Hygiene Discharge Goal  Discharge Goal: Independent    Toileting Hygiene Discharge Goal  Discharge Goal: Partial/moderate assistance    Toilet Transfer Discharge Goal  Discharge Goal: Set-up/clean-up    Shower/Bathe Self Discharge Goal  Discharge Goal: Set-up/clean-up    Upper Body Dressing Discharge Goal  Discharge Goal: Independent    Lower Body Dressing Discharge Goal  Discharge Goal: Set-up/clean-up    Putting On/Taking Off Footwear Discharge Goal  Discharge  Goal: Set-up/clean-up    Roll Left and Right Discharge Goal  Discharge Goal: Independent    Sit to Lying Discharge Goal  Discharge Goal: Independent    Lying to Sitting on Side of Bed Discharge Goal  Discharge Goal: Independent    Sit to Stand Discharge Goal  Discharge Goal: Independent    Chair/Bed-to-Chair Transfer Discharge Goal  Discharge Goal: Independent    Car Transfer Discharge Goal  Discharge Goal: Supervision or touching assistance    Walk 10 Feet Discharge Goal  Discharge Goal: Supervision or touching assistance    Walk 50 Feet with Two Turns Discharge Goal  Discharge Goal: Partial/moderate assistance    Walk 150 Feet Discharge Goal  Discharge Goal: Not attempted due to medical/safety concerns    Walking 10 Feet on Uneven Surfaces Discharge Goal  Discharge Goal: Partial/moderate assistance    1 Step (Curb) Discharge Goal  Discharge Goal: Not attempted due to medical/safety concerns    4 Steps Discharge Goal  Discharge Goal: Not attempted due to medical/safety concerns    12 Steps Discharge Goal  Discharge Goal: Not attempted due to medical/safety concerns    Picking Up Object Discharge Goal  Discharge Goal: Not attempted due to medical/safety concerns    Wheel 50 Feet with Two Turns Discharge Goal  Discharge Goal: Independent    Wheel 150 Feet Discharge Goal  Discharge Goal: Independent

## 2021-08-04 NOTE — Unmapped (Signed)
Patient alert and oriented x 4. Continent bowel and bladder. Uses bedside commode. LBM 12/29. On strict I/O. No skin issue.  Complained of pain in the evening. Tylenol helped to improve the pain.  ACHS with correctional and nutritional insulin. Insulin dosage modified today. Safety measure maintained. Bed in low position, call bell and side table within reach. Will continue to monitor.        Problem: Rehabilitation (IRF) Plan of Care  Goal: Plan of Care Review  Outcome: Progressing  Goal: Patient-Specific Goal (Individualized)  Outcome: Progressing  Goal: Absence of New-Onset Illness or Injury  Outcome: Progressing  Intervention: Prevent Fall and Fall Injury  Recent Flowsheet Documentation  Taken 08/03/2021 1800 by Howell Pringle, RN  Safety Interventions:   aspiration precautions   fall reduction program maintained   low bed  Taken 08/03/2021 1400 by Howell Pringle, RN  Safety Interventions:   fall reduction program maintained   family at bedside   low bed  Taken 08/03/2021 1200 by Howell Pringle, RN  Safety Interventions:   fall reduction program maintained   family at bedside   low bed  Taken 08/03/2021 0800 by Howell Pringle, RN  Safety Interventions:   fall reduction program maintained   family at bedside   low bed  Intervention: Prevent Infection  Recent Flowsheet Documentation  Taken 08/03/2021 1800 by Howell Pringle, RN  Infection Prevention:   hand hygiene promoted   personal protective equipment utilized  Taken 08/03/2021 1400 by Howell Pringle, RN  Infection Prevention:   hand hygiene promoted   personal protective equipment utilized  Taken 08/03/2021 1200 by Howell Pringle, RN  Infection Prevention:   hand hygiene promoted   personal protective equipment utilized  Taken 08/03/2021 0800 by Howell Pringle, RN  Infection Prevention:   hand hygiene promoted   personal protective equipment utilized  Intervention: Prevent VTE (Venous Thromboembolism)  Recent Flowsheet Documentation  Taken 08/03/2021 1200 by Howell Pringle, RN  VTE Prevention/Management: anticoagulant therapy  Goal: Optimal Comfort and Wellbeing  Outcome: Progressing  Goal: Home and Community Transition Plan Established  Outcome: Progressing     Problem: Impaired Wound Healing  Goal: Optimal Wound Healing  Outcome: Progressing  Intervention: Promote Wound Healing  Recent Flowsheet Documentation  Taken 08/03/2021 1800 by Howell Pringle, RN  Activity Management:   activity adjusted per tolerance   activity encouraged  Taken 08/03/2021 1400 by Howell Pringle, RN  Activity Management:   activity adjusted per tolerance   activity encouraged  Taken 08/03/2021 1200 by Howell Pringle, RN  Activity Management:   activity adjusted per tolerance   activity encouraged   back to bed  Taken 08/03/2021 0800 by Howell Pringle, RN  Activity Management:   activity adjusted per tolerance   activity encouraged

## 2021-08-05 LAB — CBC
HEMATOCRIT: 33.7 % — ABNORMAL LOW (ref 34.0–44.0)
HEMOGLOBIN: 11.6 g/dL (ref 11.3–14.9)
MEAN CORPUSCULAR HEMOGLOBIN CONC: 34.4 g/dL (ref 32.0–36.0)
MEAN CORPUSCULAR HEMOGLOBIN: 31.3 pg (ref 25.9–32.4)
MEAN CORPUSCULAR VOLUME: 91 fL (ref 77.6–95.7)
MEAN PLATELET VOLUME: 8.6 fL (ref 6.8–10.7)
PLATELET COUNT: 279 10*9/L (ref 150–450)
RED BLOOD CELL COUNT: 3.7 10*12/L — ABNORMAL LOW (ref 3.95–5.13)
RED CELL DISTRIBUTION WIDTH: 13.4 % (ref 12.2–15.2)
WBC ADJUSTED: 4.2 10*9/L (ref 3.6–11.2)

## 2021-08-05 LAB — BASIC METABOLIC PANEL
ANION GAP: 7 mmol/L (ref 5–14)
BLOOD UREA NITROGEN: 16 mg/dL (ref 9–23)
BUN / CREAT RATIO: 14
CALCIUM: 8.8 mg/dL (ref 8.7–10.4)
CHLORIDE: 107 mmol/L (ref 98–107)
CO2: 25.7 mmol/L (ref 20.0–31.0)
CREATININE: 1.12 mg/dL — ABNORMAL HIGH
EGFR CKD-EPI (2021) FEMALE: 62 mL/min/{1.73_m2} (ref >=60)
GLUCOSE RANDOM: 281 mg/dL — ABNORMAL HIGH (ref 70–99)
POTASSIUM: 4.3 mmol/L (ref 3.4–4.8)
SODIUM: 140 mmol/L (ref 135–145)

## 2021-08-05 LAB — MAGNESIUM: MAGNESIUM: 2 mg/dL (ref 1.6–2.6)

## 2021-08-05 MED ADMIN — insulin lispro (HumaLOG) injection 0-20 Units: 0-20 [IU] | SUBCUTANEOUS | @ 03:00:00

## 2021-08-05 MED ADMIN — insulin lispro (HumaLOG) injection 15 Units: 15 [IU] | SUBCUTANEOUS | @ 13:00:00

## 2021-08-05 MED ADMIN — empagliflozin (JARDIANCE) tablet 10 mg: 10 mg | ORAL | @ 13:00:00

## 2021-08-05 MED ADMIN — insulin glargine (LANTUS) injection 32 Units: 32 [IU] | SUBCUTANEOUS | @ 13:00:00 | Stop: 2021-08-05

## 2021-08-05 MED ADMIN — cholecalciferol (vitamin D3 25 mcg (1,000 units)) tablet 25 mcg: 25 ug | ORAL | @ 13:00:00

## 2021-08-05 MED ADMIN — finasteride (PROSCAR) tablet 5 mg: 5 mg | ORAL | @ 13:00:00

## 2021-08-05 MED ADMIN — insulin lispro (HumaLOG) injection 15 Units: 15 [IU] | SUBCUTANEOUS | @ 22:00:00

## 2021-08-05 MED ADMIN — fenofibrate (TRICOR) tablet 48 mg: 48 mg | ORAL | @ 13:00:00

## 2021-08-05 MED ADMIN — lisinopriL (PRINIVIL,ZESTRIL) tablet 40 mg: 40 mg | ORAL | @ 13:00:00

## 2021-08-05 MED ADMIN — nystatin (MYCOSTATIN) powder 1 application: 1 | TOPICAL | @ 13:00:00

## 2021-08-05 MED ADMIN — insulin lispro (HumaLOG) injection 0-20 Units: 0-20 [IU] | SUBCUTANEOUS | @ 12:00:00

## 2021-08-05 MED ADMIN — acetaminophen (TYLENOL) tablet 1,000 mg: 1000 mg | ORAL | @ 16:00:00

## 2021-08-05 MED ADMIN — insulin glargine (LANTUS) injection 32 Units: 32 [IU] | SUBCUTANEOUS | @ 03:00:00

## 2021-08-05 MED ADMIN — enoxaparin (LOVENOX) syringe 40 mg: 40 mg | SUBCUTANEOUS | @ 13:00:00

## 2021-08-05 MED ADMIN — insulin lispro (HumaLOG) injection 15 Units: 15 [IU] | SUBCUTANEOUS | @ 18:00:00

## 2021-08-05 MED ADMIN — levothyroxine (SYNTHROID) tablet 50 mcg: 50 ug | ORAL | @ 12:00:00

## 2021-08-05 MED ADMIN — clindamycin (CLEOCIN) capsule 300 mg: 300 mg | ORAL | @ 03:00:00 | Stop: 2021-08-08

## 2021-08-05 MED ADMIN — insulin lispro (HumaLOG) injection 0-20 Units: 0-20 [IU] | SUBCUTANEOUS | @ 22:00:00

## 2021-08-05 MED ADMIN — atorvastatin (LIPITOR) tablet 20 mg: 20 mg | ORAL | @ 13:00:00

## 2021-08-05 MED ADMIN — aspirin chewable tablet 81 mg: 81 mg | ORAL | @ 13:00:00

## 2021-08-05 MED ADMIN — pantoprazole (PROTONIX) EC tablet 40 mg: 40 mg | ORAL | @ 13:00:00

## 2021-08-05 MED ADMIN — insulin lispro (HumaLOG) injection 0-20 Units: 0-20 [IU] | SUBCUTANEOUS | @ 17:00:00

## 2021-08-05 MED ADMIN — nystatin (MYCOSTATIN) powder 1 application: 1 | TOPICAL | @ 03:00:00

## 2021-08-05 MED ADMIN — clindamycin (CLEOCIN) capsule 300 mg: 300 mg | ORAL | @ 13:00:00 | Stop: 2021-08-08

## 2021-08-05 MED ADMIN — cetirizine (ZyrTEC) tablet 10 mg: 10 mg | ORAL | @ 13:00:00

## 2021-08-05 MED ADMIN — enoxaparin (LOVENOX) syringe 40 mg: 40 mg | SUBCUTANEOUS | @ 03:00:00

## 2021-08-05 NOTE — Unmapped (Signed)
Physical Medicine and Rehabilitation  Daily Progress Note [facility]    ASSESSMENT:     Summer Russell is a 45 y.o. 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  ??  Impairment Group Code: (Neurologic Conditions) 03.3 Polyneuropathy    PLAN:     REHAB:   - PT and OT to maximize functional status with mobility and ADLs as well as prevention of joint contracture   - RT for community re-integration, relaxation, and Support Group  - P&O for assistive devices prn  - Pharmacy consult for patient and family education on medication management   - Patient will be discussed at next interdisciplinary team conference    ??  Lower extremity weakness/Sensory deficits: MRI brain negative (12/24). Patient unable to tolerate close spaces so spine MRI not obtainable. Patient may have lumbosacral plexopathy (such as diabetic amyotrophy vs polyneuropathy) in setting of known neuropathy, so EMG/NCS could be considered, however due to body habitus, may be more challenging to perform. Deferred EMG inpatient as symptoms improved with better glucose control.  [ ]  Plan for outpatient referral to neurology to consider outpatient EMG/NCS  Urinary retention:  - Foley removed for TOV on 12/28 prior to admission  - If failed void, recommend replacing foley with repeat bladder rest x7 days.   Prophylaxis:  - Lovenox 40 mg BID (weight based)  Pain:  - Tylenol 1000 mg TID prn  ??  Hyperglycemia - Uncontrolled T2DM with insulin dependence: BG elevated to >400 during acute hospitalization (no AG or acidemia to suggest DKA).   - inpatient consult to family medicine for insulin titration/comangement, appreciate recs  - inpatient consult to diabetes education  - Jardiance 10 mg daily??  -??glargine 36U BID (increased 12/31)  - lispro 15U TIDAC  - resistant SSI  - Holding metformin  [ ]  plan for outpatient referral to [facility] endocrinology  ??  Nephrotic range proteinuria: Nephrology consulted during acute hospitalization due to AKI, proteinuria and elevated lactate. Suspect due to??diabetic nephropathy, but also sent additional labs (infectious studies, autoimmune work-up) for further work-up.  - Continue lisinopril 40 mg daily and empagliflozin 10 mg daily as tolerated for proteinuria  - HOLD home OCP (ethinyl estradiol/drospirenone), given hypercoagulability associated with nephrotic syndrome per pharmacy recommendation.   [ ]  consider restarting bumex 1mg  BID if urine output decreases or swelling increases  [ ]  Consider rescheduling outpatient nephrology follow-up appointment pending rehab disposition (currently scheduled on 08/11/2020)  [ ]  plan to coordinate outpatient kidney biopsy with VIR prior to dischareg  ??  Hydradenitis suppurativa - Psoriasis: Managed by dermatology outpatient; on IV Renflexis, finasteride, and clindamycin.??S/p Renflexis infusion 12/19  - Continue home clindamycin 300 mg BID and finasteride 5 mg daily  ??  Vertigo/nausea: zyrtec 10 mg daily, compazine (first line), Zofran (second line), meclizine prn,   Hypothyroidism??- Continue home Synthroid 50 mcg every day + 25 mcg T/Th  HTN - Lisinopril and Bumex as above   HLD - Continue home Lipitor 20 mg daily, fenofibrate 48 mg daily, ASA 81 mg daily  Sleep Apnea - Does not tolerate CPAP at bedtime  GERD??- Continue home protonix 40 mg daily  ??  DISPO: Admitted to Rehab floor, patient will be discussed at next interdisciplinary team conference  EDD: 1/10  Follow up: PCP, neurology referral, nephrology, endocrinology referral       SUBJECTIVE:     Interval Events: NAEON. Patient doing well this morning. Family medicine continues to guide insulin regimen. Patient does report that she had blurry vision which had started shortly after her acute hospital admission that has not returned back to baseline. Additionally, patient has noticed that LE edema is starting to return. We have been holding bumex per nephrology's recommendations prior to AIR admission but requested that family medicine help guide when would be appropriate to resume.    OBJECTIVE:     Vital signs (last 24 hours):  Temp:  [36.7 ??C (98.1 ??F)-36.9 ??C (98.4 ??F)] 36.9 ??C (98.4 ??F)  Heart Rate:  [86-101] 86  Resp:  [18] 18  BP: (137-142)/(74-78) 142/78  SpO2:  [97 %-98 %] 98 %    Intake/Output (last 3 shifts):  I/O last 3 completed shifts:  In: 410 [P.O.:400; I.V.:10]  Out: -     Physical Exam:  GEN: sitting up in bed  EYES: sclera anicteric, conjunctiva clear   HENT: NCAT  NECK: trachea midline  RESP: NWOB on RA  SKIN: no visible rashes  MSK: no notable contractures  NEURO: Awake and alert, answers questions appropriately    Medications:  Scheduled   ??? aspirin chewable tablet 81 mg Daily   ??? atorvastatin (LIPITOR) tablet 20 mg Daily   ??? cetirizine (ZyrTEC) tablet 10 mg Daily   ??? cholecalciferol (vitamin D3 25 mcg (1,000 units)) tablet 25 mcg Daily   ??? clindamycin (CLEOCIN) capsule 300 mg BID   ??? diclofenac sodium (VOLTAREN) 1 % gel 2 g QID   ??? empagliflozin (JARDIANCE) tablet 10 mg Daily   ??? enoxaparin (LOVENOX) syringe 40 mg Q12H   ??? fenofibrate (TRICOR) tablet 48 mg Daily   ??? finasteride (PROSCAR) tablet 5 mg Daily   ??? insulin glargine (LANTUS) injection 32 Units BID   ??? insulin lispro (HumaLOG) injection 0-20 Units ACHS   ??? insulin lispro (HumaLOG) injection 15 Units TID AC   ??? levothyroxine (SYNTHROID) tablet 50 mcg Once per day on Sun Mon Wed Fri Sat   ??? levothyroxine (SYNTHROID) tablet 75 mcg Once per day on Tue Thu   ??? lisinopriL (PRINIVIL,ZESTRIL) tablet 40 mg Daily   ??? nystatin (MYCOSTATIN) powder 1 application BID   ??? pantoprazole (PROTONIX) EC tablet 40 mg Daily     PRN acetaminophen, 1,000 mg, Q8H PRN  bismuth subsalicylate, 30 mL, Q6H PRN  calcium carbonate, 200 mg of elem calcium, TID PRN  dextrose, 12.5 g, Q15 Min PRN  glucagon, 1 mg, Once PRN  glucose, 16 g, Q10 Min PRN  guaiFENesin, 100 mg, Q4H PRN  hydrOXYzine, 25 mg, Q6H PRN  meclizine, 25 mg, TID PRN  prochlorperazine, 5 mg, Q6H PRN   And  ondansetron, 4 mg, BID PRN  polyethylene glycol, 17 g, Daily PRN  simethicone, 80 mg, TID PRN  traZODone, 50 mg, Nightly PRN        Labs/Studies: Reviewed.    Radiology Results: Reviewed.

## 2021-08-05 NOTE — Unmapped (Signed)
Family Medicine Inpatient Service  Brief Consult Note    ASSESSMENT/ PLAN:   Summer Russell is a 45 y.o. 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     #Poorly controlled IDDM: Fasting BG and BG right before bedtime above goal. Would benefit from increase in basal insulin. Will Continue to monitor blood sugars daily. Family medicine will continue to monitor BG and make recommendations accordingly.     Recommendations:  1. INCREASE Glargine 72 units daily (36 units BID).   2. Continue Lispro 15 units w/ meals   3. Continue Resistant SSI   4. Continue Jardiance as SGLT2i is recommended in diabetic patients with CKD.   5. Continue ACEi for similar indication.   6. Consider carb restricted diet   7. Re-check A1c ~3 months  8. Agree with endocrinology referral at discharge  9. Agree with stopping Metformin given CKD  10. Consider addition of GLP1-agonist inpatient vs outpatient for glycemic control and weight loss (patient would prefer daily vs once weekly injections given difficulties in adherence to once weekly previously)  11. Agree with diabetes education      Please page Family Medicine Admission Pager [phone]) for questions or further consult needs.     HPI:  Summer Russell is a 45 y.o. 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     Did not visit patient today. Performed chart review only.     MEDICATIONS/ ALLERGIES:  No current facility-administered medications on file prior to encounter.     Current Outpatient Medications on File Prior to Encounter   Medication Sig   ??? ascorbic Acid (VITAMIN C) 500 mg CpER Take 500 mg by mouth.   ??? aspirin (ECOTRIN) 81 MG tablet Take 81 mg by mouth daily.   ??? atorvastatin (LIPITOR) 20 MG tablet Take 20 mg by mouth daily.   ??? BD INSULIN SYRINGE ULTRA-FINE 1 mL 31 gauge x 5/16 (8 mm) Syrg USE 2 (TWO) TIMES DAILY AS DIRECTED   ??? blood sugar diagnostic Strp Frequency:PHARMDIR   Dosage:0.0     Instructions:  Note:Use to test blood sugar in the morning before breakfast and in the evening two hours after dinner, ISD-9 250.00. Dose: 1   ??? blood-glucose meter Misc U QID UTD   ??? cetirizine (ZYRTEC) 10 MG tablet Take 10 mg by mouth daily.   ??? cholecalciferol, vitamin D3, 1,000 unit (25 mcg) tablet Take 1,000 Units by mouth.   ??? clindamycin (CLEOCIN) 300 MG capsule TAKE 1 CAPSULE BY MOUTH TWO TIMES A DAY.   ??? empagliflozin (JARDIANCE) 10 mg tablet Take 1 tablet (10 mg total) by mouth daily.   ??? empty container Misc Use as directed to dispose of Cosentyx pens.   ??? fenofibrate (TRICOR) 48 MG tablet Take 1 tablet (48 mg total) by mouth daily.   ??? finasteride (PROSCAR) 5 mg tablet TAKE 1 TABLET BY MOUTH EVERY DAY   ??? fluconazole (DIFLUCAN) 150 MG tablet TAKE 1 TAB NOW  AN THEN TAKE 1 TABLET ONCE WEEKLY AS NEEDED FOR YEAST INFECTION   ??? gabapentin (NEURONTIN) 300 MG capsule Take 1 capsule (300 mg total) by mouth Two (2) times a day.   ??? insulin glargine (LANTUS) 100 unit/mL injection Inject 0.6 mL (60 Units total) under the skin nightly.   ??? insulin lispro (HUMALOG) 100 unit/mL injection Inject 0.1 mL (10 Units total) under the skin Three (3) times a day before meals.   ??? levothyroxine (SYNTHROID, LEVOTHROID) 50 MCG tablet Take by mouth. Taking on Monday Wednesday Friday, Saturday & Sunday and on Tuesday & Thursday   ??? lidocaine (XYLOCAINE) 5 % ointment Apply to painful areas every 3-4 hours as needed   ??? lisinopriL (PRINIVIL,ZESTRIL) 40 MG tablet Take 1 tablet (40 mg total) by mouth daily.   ??? multivitamin capsule Take 1 capsule by mouth daily.   ??? pantoprazole (PROTONIX) 40 MG tablet Take 40 mg by mouth.   ??? pen needle, diabetic 31 gauge x 5/16 Ndle Use 1 needle to skin four times daily as directed   ??? secukinumab (COSENTYX) 150 mg/mL PnIj injection Inject the contents of 2 pens (300 mg total) under the skin every fourteen (14) days as maintenance doses.  START 14 days after last loading dose.     Allergies   Allergen Reactions   ??? [employer] Other (See Comments)   ??? Lamictal [Lamotrigine] Rash   ??? Oxycodone Nausea And Vomiting       PHYSICAL EXAM:  Vitals:    08/05/21 0800   BP: 140/80   Pulse: 86   Resp: 18   Temp: 36.6 ??C (97.9 ??F)   SpO2: 100%       Physical exam not performed. Recommendations made by chart review only.     Ara Kussmaul, MD    August 05, 2021 10:26 AM

## 2021-08-05 NOTE — Unmapped (Signed)
Patient alert and oriented x 4, spouse in the room,  blood sugar elevated earlier in the shift administered insulin as per sliding scale. Denies pain, no acute changes in the shift. Medicated as per order. Call bell and telephone within reach. Continent of bowel and bladder. Will give report to next shift.    Problem: Rehabilitation (IRF) Plan of Care  Goal: Plan of Care Review  Outcome: Progressing  Goal: Patient-Specific Goal (Individualized)  Outcome: Progressing  Goal: Absence of New-Onset Illness or Injury  Outcome: Progressing  Intervention: Prevent Fall and Fall Injury  Recent Flowsheet Documentation  Taken 08/05/2021 0200 by Rudell Cobb, RN  Safety Interventions: fall reduction program maintained  Taken 08/05/2021 0000 by Rudell Cobb, RN  Safety Interventions: fall reduction program maintained  Taken 08/04/2021 2200 by Rudell Cobb, RN  Safety Interventions: fall reduction program maintained  Taken 08/04/2021 2000 by Rudell Cobb, RN  Safety Interventions: fall reduction program maintained  Intervention: Prevent Infection  Recent Flowsheet Documentation  Taken 08/05/2021 0200 by Rudell Cobb, RN  Infection Prevention:   hand hygiene promoted   single patient room provided  Taken 08/05/2021 0000 by Rudell Cobb, RN  Infection Prevention:   hand hygiene promoted   rest/sleep promoted  Taken 08/04/2021 2200 by Rudell Cobb, RN  Infection Prevention:   hand hygiene promoted   rest/sleep promoted  Taken 08/04/2021 2000 by Rudell Cobb, RN  Infection Prevention:   rest/sleep promoted   hand hygiene promoted  Intervention: Prevent VTE (Venous Thromboembolism)  Recent Flowsheet Documentation  Taken 08/04/2021 2000 by Rudell Cobb, RN  VTE Prevention/Management: anticoagulant therapy  Goal: Optimal Comfort and Wellbeing  Outcome: Progressing  Goal: Home and Community Transition Plan Established  Outcome: Progressing     Problem: Impaired Wound Healing  Goal: Optimal Wound Healing  Outcome: Progressing Problem: Skin Injury Risk Increased  Goal: Skin Health and Integrity  Outcome: Progressing  Intervention: Optimize Skin Protection  Recent Flowsheet Documentation  Taken 08/05/2021 0200 by Rudell Cobb, RN  Head of Bed [facility]) Positioning: HOB elevated  Taken 08/05/2021 0000 by Rudell Cobb, RN  Pressure Reduction Techniques: heels elevated off bed  Head of Bed (HOB) Positioning: HOB elevated  Pressure Reduction Devices: positioning supports utilized  Skin Protection: adhesive use limited  Taken 08/04/2021 2200 by Rudell Cobb, RN  Head of Bed [facility]) Positioning: [facility] elevated  Pressure Reduction Devices: positioning supports utilized  Taken 08/04/2021 2000 by Rudell Cobb, RN  Pressure Reduction Techniques: frequent weight shift encouraged  Head of Bed (HOB) Positioning: HOB elevated  Pressure Reduction Devices: positioning supports utilized  Skin Protection: adhesive use limited

## 2021-08-05 NOTE — Unmapped (Signed)
Patient alert and oriented x 4. Continent bowel and bladder. Uses bedside commode. LBM 12/29. On strict I/O. Glycemic management maintained. Blood sugar monitored and supplemental insulin administered. Port-cath intact flushes well, dressing clean and intact.  Safety measure maintained. Bed in low position, call bell and side table within reach. Will continue to monitor.        Problem: Rehabilitation (IRF) Plan of Care  Goal: Plan of Care Review  Outcome: Progressing  Goal: Patient-Specific Goal (Individualized)  Outcome: Progressing  Goal: Absence of New-Onset Illness or Injury  Outcome: Progressing  Intervention: Prevent Fall and Fall Injury  Recent Flowsheet Documentation  Taken 08/04/2021 1600 by Howell Pringle, RN  Safety Interventions:   fall reduction program maintained   family at bedside   low bed  Taken 08/04/2021 1400 by Howell Pringle, RN  Safety Interventions:   fall reduction program maintained   low bed  Taken 08/04/2021 1200 by Howell Pringle, RN  Safety Interventions:   fall reduction program maintained   low bed   family at bedside   aspiration precautions  Taken 08/04/2021 1000 by Howell Pringle, RN  Safety Interventions:   fall reduction program maintained   family at bedside   low bed  Taken 08/04/2021 0800 by Howell Pringle, RN  Safety Interventions:   fall reduction program maintained   family at bedside   low bed  Intervention: Prevent Infection  Recent Flowsheet Documentation  Taken 08/04/2021 1600 by Howell Pringle, RN  Infection Prevention:   hand hygiene promoted   personal protective equipment utilized  Taken 08/04/2021 1400 by Howell Pringle, RN  Infection Prevention:   hand hygiene promoted   personal protective equipment utilized  Taken 08/04/2021 1200 by Howell Pringle, RN  Infection Prevention:   hand hygiene promoted   personal protective equipment utilized  Taken 08/04/2021 1000 by Howell Pringle, RN  Infection Prevention:   hand hygiene promoted   personal protective equipment utilized  Taken 08/04/2021 0800 by Howell Pringle, RN  Infection Prevention:   hand hygiene promoted   personal protective equipment utilized  Intervention: Prevent VTE (Venous Thromboembolism)  Recent Flowsheet Documentation  Taken 08/04/2021 1000 by Howell Pringle, RN  VTE Prevention/Management: anticoagulant therapy  Goal: Optimal Comfort and Wellbeing  Outcome: Progressing  Goal: Home and Community Transition Plan Established  Outcome: Progressing     Problem: Impaired Wound Healing  Goal: Optimal Wound Healing  Outcome: Progressing  Intervention: Promote Wound Healing  Recent Flowsheet Documentation  Taken 08/04/2021 1600 by Howell Pringle, RN  Activity Management:   activity adjusted per tolerance   activity encouraged   sitting, edge of bed  Taken 08/04/2021 1400 by Howell Pringle, RN  Activity Management:   activity adjusted per tolerance   activity encouraged  Taken 08/04/2021 1200 by Howell Pringle, RN  Activity Management:   activity adjusted per tolerance   activity encouraged  Taken 08/04/2021 1000 by Howell Pringle, RN  Activity Management:   activity adjusted per tolerance   activity encouraged  Taken 08/04/2021 0800 by Howell Pringle, RN  Activity Management:   activity adjusted per tolerance   activity encouraged

## 2021-08-06 MED ADMIN — empagliflozin (JARDIANCE) tablet 10 mg: 10 mg | ORAL | @ 14:00:00

## 2021-08-06 MED ADMIN — enoxaparin (LOVENOX) syringe 40 mg: 40 mg | SUBCUTANEOUS | @ 02:00:00

## 2021-08-06 MED ADMIN — insulin lispro (HumaLOG) injection 0-20 Units: 0-20 [IU] | SUBCUTANEOUS | @ 12:00:00

## 2021-08-06 MED ADMIN — clindamycin (CLEOCIN) capsule 300 mg: 300 mg | ORAL | @ 02:00:00 | Stop: 2021-08-08

## 2021-08-06 MED ADMIN — insulin lispro (HumaLOG) injection 0-20 Units: 0-20 [IU] | SUBCUTANEOUS | @ 17:00:00

## 2021-08-06 MED ADMIN — nystatin (MYCOSTATIN) powder 1 application: 1 | TOPICAL | @ 02:00:00

## 2021-08-06 MED ADMIN — cetirizine (ZyrTEC) tablet 10 mg: 10 mg | ORAL | @ 14:00:00

## 2021-08-06 MED ADMIN — lisinopriL (PRINIVIL,ZESTRIL) tablet 40 mg: 40 mg | ORAL | @ 14:00:00

## 2021-08-06 MED ADMIN — bumetanide (BUMEX) tablet 1 mg: 1 mg | ORAL | @ 18:00:00 | Stop: 2021-08-06

## 2021-08-06 MED ADMIN — nystatin (MYCOSTATIN) powder 1 application: 1 | TOPICAL | @ 14:00:00

## 2021-08-06 MED ADMIN — insulin lispro (HumaLOG) injection 20 Units: 20 [IU] | SUBCUTANEOUS | @ 13:00:00

## 2021-08-06 MED ADMIN — fenofibrate (TRICOR) tablet 48 mg: 48 mg | ORAL | @ 14:00:00

## 2021-08-06 MED ADMIN — insulin glargine (LANTUS) injection 40 Units: 40 [IU] | SUBCUTANEOUS | @ 14:00:00

## 2021-08-06 MED ADMIN — cholecalciferol (vitamin D3 25 mcg (1,000 units)) tablet 25 mcg: 25 ug | ORAL | @ 14:00:00

## 2021-08-06 MED ADMIN — insulin lispro (HumaLOG) injection 20 Units: 20 [IU] | SUBCUTANEOUS | @ 18:00:00

## 2021-08-06 MED ADMIN — enoxaparin (LOVENOX) syringe 40 mg: 40 mg | SUBCUTANEOUS | @ 14:00:00

## 2021-08-06 MED ADMIN — pantoprazole (PROTONIX) EC tablet 40 mg: 40 mg | ORAL | @ 14:00:00

## 2021-08-06 MED ADMIN — insulin lispro (HumaLOG) injection 20 Units: 20 [IU] | SUBCUTANEOUS | @ 23:00:00

## 2021-08-06 MED ADMIN — insulin lispro (HumaLOG) injection 0-20 Units: 0-20 [IU] | SUBCUTANEOUS | @ 22:00:00

## 2021-08-06 MED ADMIN — finasteride (PROSCAR) tablet 5 mg: 5 mg | ORAL | @ 14:00:00

## 2021-08-06 MED ADMIN — insulin glargine (LANTUS) injection 36 Units: 36 [IU] | SUBCUTANEOUS | @ 02:00:00

## 2021-08-06 MED ADMIN — acetaminophen (TYLENOL) tablet 1,000 mg: 1000 mg | ORAL | @ 18:00:00

## 2021-08-06 MED ADMIN — aspirin chewable tablet 81 mg: 81 mg | ORAL | @ 14:00:00

## 2021-08-06 MED ADMIN — atorvastatin (LIPITOR) tablet 20 mg: 20 mg | ORAL | @ 14:00:00

## 2021-08-06 MED ADMIN — insulin lispro (HumaLOG) injection 0-20 Units: 0-20 [IU] | SUBCUTANEOUS | @ 02:00:00

## 2021-08-06 MED ADMIN — clindamycin (CLEOCIN) capsule 300 mg: 300 mg | ORAL | @ 14:00:00 | Stop: 2021-08-08

## 2021-08-06 MED ADMIN — levothyroxine (SYNTHROID) tablet 50 mcg: 50 ug | ORAL | @ 12:00:00

## 2021-08-06 NOTE — Unmapped (Signed)
Family Medicine Inpatient Service  Brief Consult Note    ASSESSMENT/ PLAN:   Summer Russell is a 46 y.o. 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     # CKD I nephrotic range proteinuria I volume overload: Patient has long-standing history of uncontrolled DM, HTN and subsequent CKD w/ albuminuria. 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 Though, recommend kidney biopsy for further evaluation. Nephrology signed off 12/27 but plans to follow outpatient. Unclear, but seems patient has history of CKD w/ proteinuria ~10 years prior as well. PMR requesting consultation for resumption of diuretics. Subjectively patient reporting worse LE and abdominal edema. Recent Echo w/ normal EF. Most recent BMP with Cr 1.12, normal GFR.   - Resume bumex 1mg  PO daily; pending UOP and clinical report of swelling may need to consider increased bumex dose, BID dosing or IV administration first   - Okay to continue ACEi and SGLT2i   - Strict intake/output  - Daily weights   - Cont Na 2g, 2L fluid restricted diet   - Daily BMP, Mg while diuresing  [ ]  Reschedule outpatient nephrology follow-up appointment (currently scheduled on 08/11/2020)  [ ]  Agree with coordination of outpatient kidney biopsy with [facility] prior to discharge    #Poorly controlled IDDM: Fasting BG reportedly 427 by RN this morning -- though not yet documented. Voiced patient ate carb loaded dinner. Pre-prandial not yet at goal <180. On Dec 31st patient had 68 units basal insulin and 72 units lispro without BG at goal. Will plan to increase both basal and short acting insulin today. Would defer from carb restricting diet to best assess for patient's typical diet habits and true insulin needs.     Family medicine will continue to monitor BG and make recommendations accordingly.     Recommendations:  1. INCREASE Glargine 80 units daily (40 units BID).   2. INCREASE Lispro 20 units w/ meals   3. Continue Resistant SSI   4. Continue Jardiance as SGLT2i is recommended in diabetic patients with CKD.   5. Continue ACEi for similar indication.   6. Consider carb restricted diet -- though may not reflect true insulin needs   7. Re-check A1c ~3 months  8. Agree with endocrinology referral at discharge  9. Agree with stopping Metformin given CKD  10. Consider addition of GLP1-agonist inpatient vs outpatient for glycemic control and weight loss (patient would prefer daily vs once weekly injections given difficulties in adherence to once weekly previously)  11. Agree with diabetes education      Please page Family Medicine Admission Pager [phone]) for questions or further consult needs.     HPI:  Summer Russell is a 46 y.o. 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     Did not visit patient today. Performed chart review only.     MEDICATIONS/ ALLERGIES:  No current facility-administered medications on file prior  to encounter.     Current Outpatient Medications on File Prior to Encounter   Medication Sig   ??? ascorbic Acid (VITAMIN C) 500 mg CpER Take 500 mg by mouth.   ??? aspirin (ECOTRIN) 81 MG tablet Take 81 mg by mouth daily.   ??? atorvastatin (LIPITOR) 20 MG tablet Take 20 mg by mouth daily.   ??? BD INSULIN SYRINGE ULTRA-FINE 1 mL 31 gauge x 5/16 (8 mm) Syrg USE 2 (TWO) TIMES DAILY AS DIRECTED   ??? blood sugar diagnostic Strp Frequency:PHARMDIR   Dosage:0.0     Instructions:  Note:Use to test blood sugar in the morning before breakfast and in the evening two hours after dinner, ISD-9 250.00. Dose: 1   ??? blood-glucose meter Misc U QID UTD   ??? cetirizine (ZYRTEC) 10 MG tablet Take 10 mg by mouth daily.   ??? cholecalciferol, vitamin D3, 1,000 unit (25 mcg) tablet Take 1,000 Units by mouth.   ??? clindamycin (CLEOCIN) 300 MG capsule TAKE 1 CAPSULE BY MOUTH TWO TIMES A DAY.   ??? empagliflozin (JARDIANCE) 10 mg tablet Take 1 tablet (10 mg total) by mouth daily.   ??? empty container Misc Use as directed to dispose of Cosentyx pens.   ??? fenofibrate (TRICOR) 48 MG tablet Take 1 tablet (48 mg total) by mouth daily.   ??? finasteride (PROSCAR) 5 mg tablet TAKE 1 TABLET BY MOUTH EVERY DAY   ??? fluconazole (DIFLUCAN) 150 MG tablet TAKE 1 TAB NOW AN THEN TAKE 1 TABLET ONCE WEEKLY AS NEEDED FOR YEAST INFECTION   ??? gabapentin (NEURONTIN) 300 MG capsule Take 1 capsule (300 mg total) by mouth Two (2) times a day.   ??? insulin glargine (LANTUS) 100 unit/mL injection Inject 0.6 mL (60 Units total) under the skin nightly.   ??? insulin lispro (HUMALOG) 100 unit/mL injection Inject 0.1 mL (10 Units total) under the skin Three (3) times a day before meals.   ??? levothyroxine (SYNTHROID, LEVOTHROID) 50 MCG tablet Take by mouth. Taking on Monday Wednesday Friday, Saturday & Sunday and on Tuesday & Thursday   ??? lidocaine (XYLOCAINE) 5 % ointment Apply to painful areas every 3-4 hours as needed   ??? lisinopriL (PRINIVIL,ZESTRIL) 40 MG tablet Take 1 tablet (40 mg total) by mouth daily.   ??? multivitamin capsule Take 1 capsule by mouth daily.   ??? pantoprazole (PROTONIX) 40 MG tablet Take 40 mg by mouth.   ??? pen needle, diabetic 31 gauge x 5/16 Ndle Use 1 needle to skin four times daily as directed   ??? secukinumab (COSENTYX) 150 mg/mL PnIj injection Inject the contents of 2 pens (300 mg total) under the skin every fourteen (14) days as maintenance doses.  START 14 days after last loading dose.     Allergies   Allergen Reactions   ??? [employer] Other (See Comments)   ??? Lamictal [Lamotrigine] Rash   ??? Oxycodone Nausea And Vomiting       PHYSICAL EXAM:  Vitals:    08/06/21 0642   BP: 144/68   Pulse: 113   Resp: 18   Temp: 36.9 ??C (98.4 ??F)   SpO2: 96%       Physical exam not performed. Recommendations made by chart review only.     Ara Kussmaul, MD    August 06, 2021 10:49 AM

## 2021-08-06 NOTE — Unmapped (Signed)
Physical Medicine and Rehabilitation  Daily Progress Note [facility]    ASSESSMENT:     Summer Russell is a 46 y.o. 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  ??  Impairment Group Code: (Neurologic Conditions) 03.3 Polyneuropathy    PLAN:     REHAB:   - PT and OT to maximize functional status with mobility and ADLs as well as prevention of joint contracture   - RT for community re-integration, relaxation, and Support Group  - P&O for assistive devices prn  - Pharmacy consult for patient and family education on medication management   - Patient will be discussed at next interdisciplinary team conference    ??  Lower extremity weakness/Sensory deficits: MRI brain negative (12/24). Patient unable to tolerate close spaces so spine MRI not obtainable. Patient may have lumbosacral plexopathy (such as diabetic amyotrophy vs polyneuropathy) in setting of known neuropathy, so EMG/NCS could be considered, however due to body habitus, may be more challenging to perform. Deferred EMG inpatient as symptoms improved with better glucose control.  [ ]  Plan for outpatient referral to neurology to consider outpatient EMG/NCS  Urinary retention:  - Passed TOV  Prophylaxis:  - Lovenox 40 mg BID (weight based)  Pain:  - Tylenol 1000 mg TID prn  ??  Hyperglycemia - Uncontrolled T2DM with insulin dependence: BG elevated to >400 during acute hospitalization (no AG or acidemia to suggest DKA).   - inpatient consult to family medicine for insulin titration/comangement, appreciate recs  - inpatient consult to diabetes education  - Jardiance 10 mg daily??  -??glargine 40U BID (increased 1/1)  - lispro 20U TIDAC (increased 1/1)  - resistant SSI  - Holding metformin  [ ]  plan for outpatient referral to [facility] endocrinology  ??  Nephrotic range proteinuria: Nephrology consulted during acute hospitalization due to AKI, proteinuria and elevated lactate. Suspect due to??diabetic nephropathy, but also sent additional labs (infectious studies, autoimmune work-up) for further work-up.  - Continue lisinopril 40 mg daily and empagliflozin 10 mg daily as tolerated for proteinuria  - HOLD home OCP (ethinyl estradiol/drospirenone), given hypercoagulability associated with nephrotic syndrome per pharmacy recommendation.   - bumex 1mg  BID (resumed 1/1)  [ ]  Reschedule outpatient nephrology follow-up appointment (currently scheduled on 08/11/2020)  [ ]  plan to coordinate outpatient kidney biopsy with VIR prior to discharge  ??  Hydradenitis suppurativa - Psoriasis: Managed by dermatology outpatient; on IV Renflexis, finasteride, and clindamycin.??S/p Renflexis infusion 12/19  - Continue home clindamycin 300 mg BID and finasteride 5 mg daily  ??  Vertigo/nausea: zyrtec 10 mg daily, compazine (first line), Zofran (second line), meclizine prn,   Hypothyroidism??- Continue home Synthroid 50 mcg every day + 25 mcg T/Th  HTN - Lisinopril and Bumex as above   HLD - Continue home Lipitor 20 mg daily, fenofibrate 48 mg daily, ASA 81 mg daily  Sleep Apnea - Does not tolerate CPAP at bedtime  GERD??- Continue home protonix 40 mg daily  Rhinitis - Cough - lozenges, robitussin, flonase prn  ??  DISPO: Admitted to Rehab floor, patient will be discussed at next interdisciplinary team conference   EDD: 1/10  Follow up: PCP, neurology referral, nephrology, endocrinology referral       SUBJECTIVE:     Interval  Events: NAEON. Patient states that she thinks she is getting a cold, reports congestion in maxillary distribution as well as cough and rhinitis. Will add prn medications for symptomatic relief. Patient also believed leg and abdominal swelling is worsening so will resume bumex. Lastly, patient states one of CNAs noticed mass near anus, suspect hemorrhoid. Will trial anusol suppository    OBJECTIVE:     Vital signs (last 24 hours):  Temp:  [36.7 ??C (98.1 ??F)-36.9 ??C (98.4 ??F)] 36.9 ??C (98.4 ??F)  Heart Rate:  [94-113] 113  Resp:  [16-18] 18  BP: (144)/(68-71) 144/68  MAP (mmHg):  [90-91] 91  SpO2:  [96 %-100 %] 96 %    Intake/Output (last 3 shifts):  I/O last 3 completed shifts:  In: 300 [P.O.:300]  Out: -     Physical Exam:  GEN: sitting up in therapy gym  EYES: sclera anicteric, conjunctiva clear   HENT: NCAT  NECK: trachea midline  RESP: NWOB on RA  SKIN: no visible rashes  MSK: no notable contractures  NEURO: Awake and alert, answers questions appropriately    Medications:  Scheduled   ??? aspirin chewable tablet 81 mg Daily   ??? atorvastatin (LIPITOR) tablet 20 mg Daily   ??? cetirizine (ZyrTEC) tablet 10 mg Daily   ??? cholecalciferol (vitamin D3 25 mcg (1,000 units)) tablet 25 mcg Daily   ??? clindamycin (CLEOCIN) capsule 300 mg BID   ??? diclofenac sodium (VOLTAREN) 1 % gel 2 g QID   ??? empagliflozin (JARDIANCE) tablet 10 mg Daily   ??? enoxaparin (LOVENOX) syringe 40 mg Q12H   ??? fenofibrate (TRICOR) tablet 48 mg Daily   ??? finasteride (PROSCAR) tablet 5 mg Daily   ??? hydrocortisone (ANUSOL-HC) suppository 25 mg daily   ??? insulin glargine (LANTUS) injection 40 Units BID   ??? insulin lispro (HumaLOG) injection 0-20 Units ACHS   ??? insulin lispro (HumaLOG) injection 20 Units TID AC   ??? levothyroxine (SYNTHROID) tablet 50 mcg Once per day on Sun Mon Wed Fri Sat   ??? levothyroxine (SYNTHROID) tablet 75 mcg Once per day on Tue Thu   ??? lisinopriL (PRINIVIL,ZESTRIL) tablet 40 mg Daily   ??? nystatin (MYCOSTATIN) powder 1 application BID   ??? pantoprazole (PROTONIX) EC tablet 40 mg Daily     PRN acetaminophen, 1,000 mg, Q8H PRN  bismuth subsalicylate, 30 mL, Q6H PRN  calcium carbonate, 200 mg of elem calcium, TID PRN  dextrose, 12.5 g, Q15 Min PRN  fluticasone propionate, 1 spray, Daily PRN  glucagon, 1 mg, Once PRN  glucose, 16 g, Q10 Min PRN  guaiFENesin, 100 mg, Q4H PRN  hydrOXYzine, 25 mg, Q6H PRN  meclizine, 25 mg, TID PRN  cough/sore throat lozenge, 1 lozenge, Q2H PRN  prochlorperazine, 5 mg, Q6H PRN   And  ondansetron, 4 mg, BID PRN  polyethylene glycol, 17 g, Daily PRN  simethicone, 80 mg, TID PRN  traZODone, 50 mg, Nightly PRN        Labs/Studies: Reviewed.    Radiology Results: Reviewed.

## 2021-08-06 NOTE — Unmapped (Signed)
A & O x 4, continent of B & B, pain managed with PRN tylenol, fall and skin care precautions maintained, call bell in reach, monitoring ongoing.    Problem: Rehabilitation (IRF) Plan of Care  Goal: Plan of Care Review  Outcome: Progressing  Goal: Patient-Specific Goal (Individualized)  Outcome: Progressing  Goal: Absence of New-Onset Illness or Injury  Outcome: Progressing  Intervention: Prevent Fall and Fall Injury  Recent Flowsheet Documentation  Taken 08/06/2021 1400 by Winona Legato, RN  Safety Interventions:   fall reduction program maintained   nonskid shoes/slippers when out of bed  Taken 08/06/2021 1000 by Winona Legato, RN  Safety Interventions:   fall reduction program maintained   nonskid shoes/slippers when out of bed  Taken 08/06/2021 0800 by Winona Legato, RN  Safety Interventions:   fall reduction program maintained   low bed   family at bedside   nonskid shoes/slippers when out of bed  Intervention: Prevent VTE (Venous Thromboembolism)  Recent Flowsheet Documentation  Taken 08/06/2021 0930 by Winona Legato, RN  VTE Prevention/Management: anticoagulant therapy  Goal: Optimal Comfort and Wellbeing  Outcome: Progressing  Goal: Home and Community Transition Plan Established  Outcome: Progressing     Problem: Impaired Wound Healing  Goal: Optimal Wound Healing  Outcome: Progressing     Problem: Self-Care Deficit  Goal: Improved Ability to Complete Activities of Daily Living  Outcome: Progressing     Problem: Skin Injury Risk Increased  Goal: Skin Health and Integrity  Outcome: Progressing  Intervention: Optimize Skin Protection  Recent Flowsheet Documentation  Taken 08/06/2021 1400 by Winona Legato, RN  Pressure Reduction Techniques:   frequent weight shift encouraged   pressure points protected  Taken 08/06/2021 1000 by Winona Legato, RN  Pressure Reduction Techniques:   frequent weight shift encouraged   pressure points protected  Taken 08/06/2021 0800 by Winona Legato, RN  Pressure Reduction Techniques:   frequent weight shift encouraged   heels elevated off bed   pressure points protected

## 2021-08-06 NOTE — Unmapped (Signed)
Patient alert and oriented x 4, spouse in the room. Continent of bladder and bowel. LBM 08/05/21. Blood sugar monitored and administered insulin as per order. Pain managed with Tylenol, successful. Safety precautions in place and maintained. Hourly rounding performed by nursing staff.       Problem: Rehabilitation (IRF) Plan of Care  Goal: Plan of Care Review  Outcome: Progressing  Goal: Patient-Specific Goal (Individualized)  Outcome: Progressing  Goal: Absence of New-Onset Illness or Injury  Outcome: Progressing  Intervention: Prevent Fall and Fall Injury  Recent Flowsheet Documentation  Taken 08/05/2021 1600 by Fransisco Beau, RN  Safety Interventions:   fall reduction program maintained   family at bedside  Taken 08/05/2021 1207 by Fransisco Beau, RN  Safety Interventions: fall reduction program maintained  Taken 08/05/2021 0800 by Fransisco Beau, RN  Safety Interventions: fall reduction program maintained  Goal: Optimal Comfort and Wellbeing  Outcome: Progressing  Goal: Home and Community Transition Plan Established  Outcome: Progressing     Problem: Impaired Wound Healing  Goal: Optimal Wound Healing  Outcome: Progressing     Problem: Self-Care Deficit  Goal: Improved Ability to Complete Activities of Daily Living  Outcome: Progressing     Problem: Skin Injury Risk Increased  Goal: Skin Health and Integrity  Outcome: Progressing  Intervention: Optimize Skin Protection  Recent Flowsheet Documentation  Taken 08/05/2021 1600 by Fransisco Beau, RN  Pressure Reduction Techniques: frequent weight shift encouraged  Taken 08/05/2021 1207 by Fransisco Beau, RN  Pressure Reduction Techniques: frequent weight shift encouraged  Taken 08/05/2021 0800 by Fransisco Beau, RN  Pressure Reduction Techniques: frequent weight shift encouraged  Head of Bed (HOB) Positioning: HOB elevated

## 2021-08-06 NOTE — Unmapped (Signed)
Patient alert and oriented x 4, no distress respiration even and unlabored in room air. Blood sugar monitored and medicated as per order. Call bell and telephone within reach. Spouse in the room Continues. Will give report to next shift.      Problem: Rehabilitation (IRF) Plan of Care  Goal: Plan of Care Review  Outcome: Progressing  Goal: Patient-Specific Goal (Individualized)  Outcome: Progressing  Goal: Absence of New-Onset Illness or Injury  Outcome: Progressing  Intervention: Prevent Fall and Fall Injury  Recent Flowsheet Documentation  Taken 08/06/2021 0200 by Rudell Cobb, RN  Safety Interventions: fall reduction program maintained  Taken 08/06/2021 0000 by Rudell Cobb, RN  Safety Interventions: fall reduction program maintained  Taken 08/05/2021 2200 by Rudell Cobb, RN  Safety Interventions: fall reduction program maintained  Taken 08/05/2021 2000 by Rudell Cobb, RN  Safety Interventions: fall reduction program maintained  Intervention: Prevent Infection  Recent Flowsheet Documentation  Taken 08/06/2021 0200 by Rudell Cobb, RN  Infection Prevention:   hand hygiene promoted   rest/sleep promoted  Taken 08/06/2021 0000 by Rudell Cobb, RN  Infection Prevention:   hand hygiene promoted   rest/sleep promoted  Taken 08/05/2021 2200 by Rudell Cobb, RN  Infection Prevention:   hand hygiene promoted   rest/sleep promoted  Taken 08/05/2021 2000 by Rudell Cobb, RN  Infection Prevention:   hand hygiene promoted   rest/sleep promoted  Intervention: Prevent VTE (Venous Thromboembolism)  Recent Flowsheet Documentation  Taken 08/05/2021 2000 by Rudell Cobb, RN  VTE Prevention/Management: anticoagulant therapy  Goal: Optimal Comfort and Wellbeing  Outcome: Progressing  Goal: Home and Community Transition Plan Established  Outcome: Progressing     Problem: Impaired Wound Healing  Goal: Optimal Wound Healing  Outcome: Progressing     Problem: Self-Care Deficit  Goal: Improved Ability to Complete Activities of Daily Living  Outcome: Progressing  Intervention: Promote Activity and Functional Independence  Recent Flowsheet Documentation  Taken 08/05/2021 2000 by Rudell Cobb, RN  Self-Care Promotion: independence encouraged

## 2021-08-07 LAB — BASIC METABOLIC PANEL
ANION GAP: 10 mmol/L (ref 5–14)
BLOOD UREA NITROGEN: 18 mg/dL (ref 9–23)
BUN / CREAT RATIO: 18
CALCIUM: 8.8 mg/dL (ref 8.7–10.4)
CHLORIDE: 106 mmol/L (ref 98–107)
CO2: 24.5 mmol/L (ref 20.0–31.0)
CREATININE: 0.98 mg/dL — ABNORMAL HIGH
EGFR CKD-EPI (2021) FEMALE: 73 mL/min/{1.73_m2} (ref >=60)
GLUCOSE RANDOM: 216 mg/dL — ABNORMAL HIGH (ref 70–179)
POTASSIUM: 4.3 mmol/L (ref 3.4–4.8)
SODIUM: 140 mmol/L (ref 135–145)

## 2021-08-07 LAB — CBC
HEMATOCRIT: 32.4 % — ABNORMAL LOW (ref 34.0–44.0)
HEMOGLOBIN: 11.3 g/dL (ref 11.3–14.9)
MEAN CORPUSCULAR HEMOGLOBIN CONC: 34.8 g/dL (ref 32.0–36.0)
MEAN CORPUSCULAR HEMOGLOBIN: 31.2 pg (ref 25.9–32.4)
MEAN CORPUSCULAR VOLUME: 89.7 fL (ref 77.6–95.7)
MEAN PLATELET VOLUME: 8.6 fL (ref 6.8–10.7)
PLATELET COUNT: 280 10*9/L (ref 150–450)
RED BLOOD CELL COUNT: 3.61 10*12/L — ABNORMAL LOW (ref 3.95–5.13)
RED CELL DISTRIBUTION WIDTH: 13.5 % (ref 12.2–15.2)
WBC ADJUSTED: 4.5 10*9/L (ref 3.6–11.2)

## 2021-08-07 LAB — MAGNESIUM: MAGNESIUM: 1.9 mg/dL (ref 1.6–2.6)

## 2021-08-07 MED ADMIN — pantoprazole (PROTONIX) EC tablet 40 mg: 40 mg | ORAL | @ 14:00:00

## 2021-08-07 MED ADMIN — menthol (COUGH DROPS) lozenge 1 lozenge: 1 | ORAL | @ 10:00:00

## 2021-08-07 MED ADMIN — insulin glargine (LANTUS) injection 40 Units: 40 [IU] | SUBCUTANEOUS | @ 02:00:00

## 2021-08-07 MED ADMIN — menthol (COUGH DROPS) lozenge 1 lozenge: 1 | ORAL | @ 18:00:00

## 2021-08-07 MED ADMIN — insulin lispro (HumaLOG) injection 20 Units: 20 [IU] | SUBCUTANEOUS | @ 18:00:00

## 2021-08-07 MED ADMIN — enoxaparin (LOVENOX) syringe 40 mg: 40 mg | SUBCUTANEOUS | @ 02:00:00

## 2021-08-07 MED ADMIN — insulin lispro (HumaLOG) injection 0-20 Units: 0-20 [IU] | SUBCUTANEOUS

## 2021-08-07 MED ADMIN — insulin lispro (HumaLOG) injection 0-20 Units: 0-20 [IU] | SUBCUTANEOUS | @ 02:00:00

## 2021-08-07 MED ADMIN — finasteride (PROSCAR) tablet 5 mg: 5 mg | ORAL | @ 14:00:00

## 2021-08-07 MED ADMIN — fenofibrate (TRICOR) tablet 48 mg: 48 mg | ORAL | @ 14:00:00

## 2021-08-07 MED ADMIN — acetaminophen (TYLENOL) tablet 1,000 mg: 1000 mg | ORAL | @ 02:00:00

## 2021-08-07 MED ADMIN — fluticasone propionate (FLONASE) 50 mcg/actuation nasal spray 1 spray: 1 | NASAL | @ 08:00:00

## 2021-08-07 MED ADMIN — nystatin (MYCOSTATIN) powder 1 application: 1 | TOPICAL | @ 14:00:00

## 2021-08-07 MED ADMIN — levothyroxine (SYNTHROID) tablet 50 mcg: 50 ug | ORAL | @ 10:00:00

## 2021-08-07 MED ADMIN — enoxaparin (LOVENOX) syringe 40 mg: 40 mg | SUBCUTANEOUS | @ 14:00:00

## 2021-08-07 MED ADMIN — insulin glargine (LANTUS) injection 40 Units: 40 [IU] | SUBCUTANEOUS | @ 14:00:00

## 2021-08-07 MED ADMIN — cetirizine (ZyrTEC) tablet 10 mg: 10 mg | ORAL | @ 14:00:00

## 2021-08-07 MED ADMIN — nystatin (MYCOSTATIN) powder 1 application: 1 | TOPICAL | @ 02:00:00

## 2021-08-07 MED ADMIN — insulin lispro (HumaLOG) injection 20 Units: 20 [IU] | SUBCUTANEOUS

## 2021-08-07 MED ADMIN — aspirin chewable tablet 81 mg: 81 mg | ORAL | @ 13:00:00

## 2021-08-07 MED ADMIN — lisinopriL (PRINIVIL,ZESTRIL) tablet 40 mg: 40 mg | ORAL | @ 14:00:00

## 2021-08-07 MED ADMIN — menthol (COUGH DROPS) lozenge 1 lozenge: 1 | ORAL | @ 21:00:00

## 2021-08-07 MED ADMIN — menthol (COUGH DROPS) lozenge 1 lozenge: 1 | ORAL | @ 08:00:00

## 2021-08-07 MED ADMIN — insulin lispro (HumaLOG) injection 20 Units: 20 [IU] | SUBCUTANEOUS | @ 14:00:00

## 2021-08-07 MED ADMIN — clindamycin (CLEOCIN) capsule 300 mg: 300 mg | ORAL | @ 02:00:00 | Stop: 2021-08-08

## 2021-08-07 MED ADMIN — atorvastatin (LIPITOR) tablet 20 mg: 20 mg | ORAL | @ 14:00:00

## 2021-08-07 MED ADMIN — menthol (COUGH DROPS) lozenge 1 lozenge: 1 | ORAL | @ 14:00:00

## 2021-08-07 MED ADMIN — bumetanide (BUMEX) tablet 1 mg: 1 mg | ORAL | @ 13:00:00 | Stop: 2021-08-07

## 2021-08-07 MED ADMIN — cholecalciferol (vitamin D3 25 mcg (1,000 units)) tablet 25 mcg: 25 ug | ORAL | @ 13:00:00

## 2021-08-07 MED ADMIN — empagliflozin (JARDIANCE) tablet 10 mg: 10 mg | ORAL | @ 13:00:00

## 2021-08-07 MED ADMIN — insulin lispro (HumaLOG) injection 0-20 Units: 0-20 [IU] | SUBCUTANEOUS | @ 12:00:00

## 2021-08-07 MED ADMIN — bumetanide (BUMEX) tablet 1 mg: 1 mg | ORAL | @ 22:00:00

## 2021-08-07 MED ADMIN — menthol (COUGH DROPS) lozenge 1 lozenge: 1 | ORAL | @ 02:00:00

## 2021-08-07 MED ADMIN — insulin lispro (HumaLOG) injection 0-20 Units: 0-20 [IU] | SUBCUTANEOUS | @ 18:00:00

## 2021-08-07 MED ADMIN — clindamycin (CLEOCIN) capsule 300 mg: 300 mg | ORAL | @ 13:00:00 | Stop: 2021-08-08

## 2021-08-07 NOTE — Unmapped (Signed)
A & O x 4 ,continent B & B ,use BSC ,generalized swelling persisting ,pain managed with Tylenol,bed low locked ,call bell & phone within reach ,will continue to monitor.   Problem: Rehabilitation (IRF) Plan of Care  Goal: Plan of Care Review  Outcome: Progressing  Goal: Patient-Specific Goal (Individualized)  Outcome: Progressing  Goal: Absence of New-Onset Illness or Injury  Outcome: Progressing  Intervention: Prevent Fall and Fall Injury  Recent Flowsheet Documentation  Taken 08/07/2021 0000 by Lanice Schwab, RN  Safety Interventions: fall reduction program maintained  Taken 08/06/2021 2200 by Lanice Schwab, RN  Safety Interventions: fall reduction program maintained  Taken 08/06/2021 2000 by Lanice Schwab, RN  Safety Interventions: fall reduction program maintained  Intervention: Prevent Infection  Recent Flowsheet Documentation  Taken 08/06/2021 2200 by Lanice Schwab, RN  Infection Prevention: hand hygiene promoted  Taken 08/06/2021 2000 by Lanice Schwab, RN  Infection Prevention: hand hygiene promoted  Goal: Optimal Comfort and Wellbeing  Outcome: Progressing  Goal: Home and Community Transition Plan Established  Outcome: Progressing     Problem: Impaired Wound Healing  Goal: Optimal Wound Healing  Outcome: Progressing     Problem: Self-Care Deficit  Goal: Improved Ability to Complete Activities of Daily Living  Outcome: Progressing     Problem: Skin Injury Risk Increased  Goal: Skin Health and Integrity  Outcome: Progressing  Intervention: Optimize Skin Protection  Recent Flowsheet Documentation  Taken 08/07/2021 0000 by Lanice Schwab, RN  Pressure Reduction Techniques: frequent weight shift encouraged  Head of Bed (HOB) Positioning: HOB elevated  Pressure Reduction Devices: positioning supports utilized  Taken 08/06/2021 2200 by Lanice Schwab, RN  Pressure Reduction Techniques: frequent weight shift encouraged  Head of Bed (HOB) Positioning: HOB elevated  Pressure Reduction Devices: positioning supports utilized  Taken 08/06/2021 2000 by Lanice Schwab, RN  Pressure Reduction Techniques: frequent weight shift encouraged  Head of Bed (HOB) Positioning: HOB elevated  Pressure Reduction Devices: positioning supports utilized  Skin Protection: incontinence pads utilized

## 2021-08-07 NOTE — Unmapped (Signed)
Family Medicine Inpatient Service  Brief Consult Note    ASSESSMENT/ PLAN:   Summer Russell is a 46 y.o. female with a history of HTN, HLD, GERD, uncontrolled type 2 diabetes, polyneuropathy, hypothyroidism, ??psoriasis complicated by arthritis (on long-standing infliximab infusions), fibromyalgia, and sleep apnea??admitted to [facility] 12/20 for severe hyperglycemia and lactic acidosis and nephrotic range proteinuria along with worsening left sided weakness, urinary retention and saddle anesthesia who is seen in consultation at the request of PMR colleages for evaluation of insulin management for T2DM.     # CKD I nephrotic range proteinuria I volume overload: Patient has long-standing history of uncontrolled DM, HTN and subsequent CKD w/ albuminuria. 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 Though, recommend kidney biopsy for further evaluation. Nephrology signed off 12/27 but plans to follow outpatient. Unclear, but seems patient has history of CKD w/ proteinuria ~10 years prior as well. PMR requesting consultation for resumption of diuretics. Subjectively patient reporting worse LE and abdominal edema. Recent Echo w/ normal EF. Most recent BMP with Cr 1.12, normal GFR.   - Increase bumex 1mg  PO daily-> BID; excellent UOP w/ bumex 1mg  daily 2.4L, was previously BID per nephrology earlier in hospital course. Will resume.  - Okay to continue ACEi and SGLT2i   - Strict intake/output  - Daily weights   - Discussed restricting diet, so 2g Na and 2L fluid restricted diet the patient doesn't feel is reasonable especially to do both Na, fluid restriction and consistent carb  - Daily BMP, Mg while diuresing  [ ]  Reschedule outpatient nephrology follow-up appointment (currently scheduled on 08/11/2020)  [ ]  Agree with coordination of outpatient kidney biopsy with VIR prior to discharge    #Poorly controlled IDDM: Fasting BG reportedly 427 by RN this morning -- though not yet documented. Voiced patient ate carb loaded dinner. Pre-prandial not yet at goal <180. On Dec 31st patient had 68 units basal insulin and 72 units lispro without BG at goal. Will plan to increase both basal and short acting insulin today. Would defer from carb restricting diet to best assess for patient's typical diet habits and true insulin needs.     Family medicine will continue to monitor BG and make recommendations accordingly.     Recommendations:  1. CONTINUE Glargine 80 units daily (40 units BID).   2. CONTINUE Lispro 20 units daily (consider up-titrating on 1/3)  3. Continue Resistant SSI   4. Continue Jardiance as SGLT2i is recommended in diabetic patients with CKD.   5. Continue ACEi for similar indication.   6. Consider carb restricted diet -- though may not reflect true insulin needs   7. Re-check A1c ~3 months  8. Agree with endocrinology referral at discharge  9. Agree with stopping Metformin given CKD  10. Consider addition of GLP1-agonist inpatient vs outpatient for glycemic control and weight loss (patient would prefer daily vs once weekly injections given difficulties in adherence to once weekly previously)  11. Agree with diabetes education      Please page Family Medicine Admission Pager [phone]) for questions or further consult needs.     HPI:  Summer Russell is a 46 y.o. 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     Pt reports some subjective  improvement in swelling after resumption of bumex yesterday. Swelling is worse after activity and when not elevating the legs. Would like to increase the dose. Doesn't feel that sodium, fluid restriction, and consistent carb are realistic for her diet though she is trying to make some dietary adjustments. Did eat 2 large pasta meals 2 days ago, which she thinks caused the blood sugars in the 400s on that day.       MEDICATIONS/ ALLERGIES:  No current facility-administered medications on file prior to encounter.     Current Outpatient Medications on File Prior to Encounter   Medication Sig   ??? ascorbic Acid (VITAMIN C) 500 mg CpER Take 500 mg by mouth.   ??? aspirin (ECOTRIN) 81 MG tablet Take 81 mg by mouth daily.   ??? atorvastatin (LIPITOR) 20 MG tablet Take 20 mg by mouth daily.   ??? BD INSULIN SYRINGE ULTRA-FINE 1 mL 31 gauge x 5/16 (8 mm) Syrg USE 2 (TWO) TIMES DAILY AS DIRECTED   ??? blood sugar diagnostic Strp Frequency:PHARMDIR   Dosage:0.0     Instructions:  Note:Use to test blood sugar in the morning before breakfast and in the evening two hours after dinner, ISD-9 250.00. Dose: 1   ??? blood-glucose meter Misc U QID UTD   ??? cetirizine (ZYRTEC) 10 MG tablet Take 10 mg by mouth daily.   ??? cholecalciferol, vitamin D3, 1,000 unit (25 mcg) tablet Take 1,000 Units by mouth.   ??? clindamycin (CLEOCIN) 300 MG capsule TAKE 1 CAPSULE BY MOUTH TWO TIMES A DAY.   ??? empagliflozin (JARDIANCE) 10 mg tablet Take 1 tablet (10 mg total) by mouth daily.   ??? empty container Misc Use as directed to dispose of Cosentyx pens.   ??? fenofibrate (TRICOR) 48 MG tablet Take 1 tablet (48 mg total) by mouth daily.   ??? finasteride (PROSCAR) 5 mg tablet TAKE 1 TABLET BY MOUTH EVERY DAY   ??? fluconazole (DIFLUCAN) 150 MG tablet TAKE 1 TAB NOW AN THEN TAKE 1 TABLET ONCE WEEKLY AS NEEDED FOR YEAST INFECTION   ??? gabapentin (NEURONTIN) 300 MG capsule Take 1 capsule (300 mg total) by mouth Two (2) times a day.   ??? insulin glargine (LANTUS) 100 unit/mL injection Inject 0.6 mL (60 Units total) under the skin nightly.   ??? insulin lispro (HUMALOG) 100 unit/mL injection Inject 0.1 mL (10 Units total) under the skin Three (3) times a day before meals.   ??? levothyroxine (SYNTHROID, LEVOTHROID) 50 MCG tablet Take by mouth. Taking on Monday Wednesday Friday, Saturday & Sunday and on Tuesday & Thursday   ??? lidocaine (XYLOCAINE) 5 % ointment Apply to painful areas every 3-4 hours as needed   ??? lisinopriL (PRINIVIL,ZESTRIL) 40 MG tablet Take 1 tablet (40 mg total) by mouth daily.   ??? multivitamin capsule Take 1 capsule by mouth daily.   ??? pantoprazole (PROTONIX) 40 MG tablet Take 40 mg by mouth.   ??? pen needle, diabetic 31 gauge x 5/16 Ndle Use 1 needle to skin four times daily as directed   ??? secukinumab (COSENTYX) 150 mg/mL PnIj injection Inject the contents of 2 pens (300 mg total) under the skin every fourteen (14) days as maintenance doses.  START 14 days after last loading dose.     Allergies   Allergen Reactions   ??? [employer] Other (See Comments)   ??? Lamictal [Lamotrigine] Rash   ??? Oxycodone Nausea And Vomiting       PHYSICAL EXAM:  Vitals:  08/07/21 0500   BP: 136/70   Pulse: 89   Resp: 18   Temp: 36.7 ??C (98.1 ??F)   SpO2: 98%     GEN: sitting up in bed, NAD, well appearing  EYES: sclera anicteric, conjunctiva clear   HENT: NCAT  CARD: Heart has a regular rate and rhythm, no murmurs, gallops, or rubs  RESP: normal work of breathing on RA, no crackes, wheezes or rales  EXT: some erythema on shins venous stasis dermatitis changes likely, trace pitting edema in feet to the mid shins.  NEURO: Awake and alert, answers questions appropriately    Raelyn Mora, MD    August 06, 2021 10:49 AM

## 2021-08-07 NOTE — Unmapped (Signed)
Recreational Therapy Evaluation  08/07/2021     Patient Name:  Summer Russell       Medical Record Number: [id number]   Date of Birth: 09/24/1975  Sex: Female          Room/Bed:  3H206/3H206-01    Eval Duration: 20 Min.    Assessment  Pt is a 46 y.o. 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 Pt was seen by LRT this AM to complete initial RT assessment. Pt received sitting upright in bedside recliner in her room upon LRT arrival. Pt appeared to be in good spirits and did not report any pain or discomfort at this time. LRT provided education regarding the role of RT tx services and established rapport. Pt reported less active lifestyle, which primarily consisted of spending time around her house. In her leisure, she reported that she enjoys reading and spending time with her pets. Pt expressed mild difficulty with emotional adjustment given dx, CLOF, and prolonged hospitalization. However, she reported that she is ???doing alright??? at this time and has strong support from her spouse. Pt is motivated to participate in therapies and is hopeful for a positive recovery process. Pt would benefit from continued RT tx services to promote increased activity tolerance with scheduled 4th hour of therapy, monitor emotional adjustment, and provide dx/community-based resources prior to d/c. Will continue to follow RT POC and goals as listed below.    Plan of Care  1x per day for: 1-2x week     Patient's Identified Treatment Goal: to get better  Treatment Plan developed in collaboration with: Treatment Team  Interventions: Adjustment to hospitalization, Health Care Encounters and Medical Condition, Barrier education, Community reintegration, Discharge planning, Education - Family / caregiver, Education - Patient, Printmaker, Goal setting, Leisure, Mindfulness, Management consultant, Stress management, Wellness / recovery     Goals:  Within 4 tx sessions, pt will participate in at least 2 leisure/wellness pursuits for >20 minutes to promote increased activity tolerance.   Prior to d/c, after education, pt will be able to identify at least 2 resources to assist with emotional/socials support and community reintegration.      Subjective    Current Situation: Pt received sitting upright in bedside recliner in her room upon LRT arrival.  Cognitive, Emotional, Physical, Social, and Leisure/Life functioning were assessed:: Patient Interviews, Review of Chart, Discussion with family, Treatment Team, Observation in Activities/Interventions  Living Environment: House  Lives With: Spouse  Precautions: Falls precautions  Equipment / Environment: Caregiver wearing mask for full session, Patient not wearing mask for full session  Reports/displays signs/symptoms of pain?: No  Add'l Session Information: Geophysicist/field seismologist present, RN aware of RT tx session    Past Medical History:   Diagnosis Date   ??? CKD (chronic kidney disease) 07/25/2021   ??? Diabetes mellitus (CMS-HCC)    ??? Disease of thyroid gland    ??? Fibromyalgia    ??? Hydradenitis    ??? Psoriasis    ??? Psoriatic arthritis (CMS-HCC)    ??? Sleep apnea     Social History     Tobacco Use   ??? Smoking status: Former     Packs/day: 2.00     Years: 20.00     Pack years: 40.00  Types: Cigarettes     Quit date: 2013     Years since quitting: 10.0   ??? Smokeless tobacco: Never   Substance Use Topics   ??? Alcohol use: No      Past Surgical History:   Procedure Laterality Date   ??? IR INSERT PORT AGE GREATER THAN 5 YRS  11/06/2019    IR INSERT PORT AGE GREATER THAN 5 YRS 11/06/2019 Soledad Gerlach, MD [facility]   ??? SKIN BIOPSY     ??? TONSILLECTOMY      Family History Adopted: Yes   Problem Relation Age of Onset   ??? Diabetes Mother    ??? Hypertension Mother    ??? Diabetes Father    ??? Hypertension Father    ??? Melanoma Neg Hx    ??? Basal cell carcinoma Neg Hx    ??? Squamous cell carcinoma Neg Hx         Allergies: Venom-honey bee, Lamictal [lamotrigine], and Oxycodone     Objective    Cognitive  Stage of change / level of insight: Preparation  Thought Process/Content: Organized  Memory: Independent with recall  Follows Directions: Able to follow directions independently  Attention Span/Alertness : Able to attend to RT assessment  Medical understanding: Knows name of diagnosis and/or medical equipment, Understands long term implications of diagnosis and/or treatment, Able to explain treatments/procedures related to medical condition, Would benefit from additional dx/tx education'  Orientation: Fully oriented  Additional Cognitive Domain Comment: Pt is AOx4 and cognitively intact - no SLP services needed    Communication  Communication Barriers: None noted    Emotional  Mood: Euthymic  Affect: Congruent  Anxiety Management : Reports anxiety that is situational  Pain Management : Reports pain that is situational  Frustration Tolerance: Reports frustration that is situational  Coping Skills : Reports independent practice of healthy coping strategies, Requires resources/assistance to utilize coping strategies  Emotional Expression: Independently expresses feelings  Additional Emotional Domain comments: Pt endorsed that she is doing well from an emotional adjustment standpoint as she feels well supported by her spouse and feels encouraged by the progress made in rehab thus far.    Physical Domain  Mobility Comments: See PT assessment for full evaluation.  Upper extremity function: See OT assessment for full evaluation.  Lower extremity function: See PT assessment for full evaluation.    Social  Support system: Reports positive support system  Assertiveness: Independent with assertiveness  Patient Behaviors and Interactions: Appropriate  Ability to form relationship / interact with others: Able to form social relationships independently  Additional Social Domain Comments: Pt lives in [place], [place] ([place] with her spouse.     Leisure and Life Function  Level of involvement: Occasional participation  Firefighter / barrier education: Requires resources to safely problem solve community barriers  Motivation to engage in leisure / play: Yes  Quality of participation: Involved in healthy leisure and verbalizes plans to overcome barriers  Additional Leisure and Life Function Comments: Pt expresssed interest in the following leisure pursuits: watching television, movies, arts & crafts activities, reading, and spending time with her pets.    I attest that I have reviewed the above information.  Signed by Jed Limerick, LRT/CTRS, CBIS  Filed 08/07/2021

## 2021-08-08 LAB — MAGNESIUM: MAGNESIUM: 1.6 mg/dL (ref 1.6–2.6)

## 2021-08-08 LAB — BASIC METABOLIC PANEL
ANION GAP: 9 mmol/L (ref 5–14)
BLOOD UREA NITROGEN: 21 mg/dL (ref 9–23)
BUN / CREAT RATIO: 20
CALCIUM: 9.4 mg/dL (ref 8.7–10.4)
CHLORIDE: 104 mmol/L (ref 98–107)
CO2: 25.2 mmol/L (ref 20.0–31.0)
CREATININE: 1.03 mg/dL — ABNORMAL HIGH
EGFR CKD-EPI (2021) FEMALE: 69 mL/min/{1.73_m2} (ref >=60)
GLUCOSE RANDOM: 246 mg/dL — ABNORMAL HIGH (ref 70–179)
POTASSIUM: 4.1 mmol/L (ref 3.4–4.8)
SODIUM: 138 mmol/L (ref 135–145)

## 2021-08-08 MED ADMIN — finasteride (PROSCAR) tablet 5 mg: 5 mg | ORAL | @ 16:00:00

## 2021-08-08 MED ADMIN — menthol (COUGH DROPS) lozenge 1 lozenge: 1 | ORAL | @ 02:00:00

## 2021-08-08 MED ADMIN — atorvastatin (LIPITOR) tablet 20 mg: 20 mg | ORAL | @ 16:00:00

## 2021-08-08 MED ADMIN — fenofibrate (TRICOR) tablet 48 mg: 48 mg | ORAL | @ 16:00:00

## 2021-08-08 MED ADMIN — nystatin (MYCOSTATIN) powder 1 application: 1 | TOPICAL | @ 16:00:00

## 2021-08-08 MED ADMIN — menthol (COUGH DROPS) lozenge 1 lozenge: 1 | ORAL | @ 07:00:00

## 2021-08-08 MED ADMIN — pantoprazole (PROTONIX) EC tablet 40 mg: 40 mg | ORAL | @ 16:00:00

## 2021-08-08 MED ADMIN — clindamycin (CLEOCIN) capsule 300 mg: 300 mg | ORAL | @ 02:00:00 | Stop: 2021-08-08

## 2021-08-08 MED ADMIN — levothyroxine (SYNTHROID) tablet 75 mcg: 75 ug | ORAL | @ 11:00:00

## 2021-08-08 MED ADMIN — guaiFENesin (ROBITUSSIN) oral syrup: 100 mg | ORAL | @ 16:00:00

## 2021-08-08 MED ADMIN — lisinopriL (PRINIVIL,ZESTRIL) tablet 40 mg: 40 mg | ORAL | @ 16:00:00

## 2021-08-08 MED ADMIN — cholecalciferol (vitamin D3 25 mcg (1,000 units)) tablet 25 mcg: 25 ug | ORAL | @ 16:00:00

## 2021-08-08 MED ADMIN — enoxaparin (LOVENOX) syringe 40 mg: 40 mg | SUBCUTANEOUS | @ 02:00:00

## 2021-08-08 MED ADMIN — insulin lispro (HumaLOG) injection 0-20 Units: 0-20 [IU] | SUBCUTANEOUS | @ 18:00:00

## 2021-08-08 MED ADMIN — insulin lispro (HumaLOG) injection 0-20 Units: 0-20 [IU] | SUBCUTANEOUS | @ 13:00:00

## 2021-08-08 MED ADMIN — cetirizine (ZyrTEC) tablet 10 mg: 10 mg | ORAL | @ 16:00:00

## 2021-08-08 MED ADMIN — aspirin chewable tablet 81 mg: 81 mg | ORAL | @ 16:00:00

## 2021-08-08 MED ADMIN — empagliflozin (JARDIANCE) tablet 10 mg: 10 mg | ORAL | @ 16:00:00

## 2021-08-08 MED ADMIN — bumetanide (BUMEX) tablet 1 mg: 1 mg | ORAL | @ 20:00:00

## 2021-08-08 MED ADMIN — acetaminophen (TYLENOL) tablet 1,000 mg: 1000 mg | ORAL | @ 07:00:00

## 2021-08-08 MED ADMIN — insulin lispro (HumaLOG) injection 22 Units: 22 [IU] | SUBCUTANEOUS | @ 18:00:00

## 2021-08-08 MED ADMIN — insulin lispro (HumaLOG) injection 20 Units: 20 [IU] | SUBCUTANEOUS | @ 13:00:00 | Stop: 2021-08-08

## 2021-08-08 MED ADMIN — guaiFENesin (ROBITUSSIN) oral syrup: 100 mg | ORAL | @ 11:00:00

## 2021-08-08 MED ADMIN — clindamycin (CLEOCIN) capsule 300 mg: 300 mg | ORAL | @ 16:00:00 | Stop: 2021-08-08

## 2021-08-08 MED ADMIN — insulin lispro (HumaLOG) injection 0-20 Units: 0-20 [IU] | SUBCUTANEOUS | @ 02:00:00

## 2021-08-08 MED ADMIN — prochlorperazine (COMPAZINE) tablet 5 mg: 5 mg | ORAL | @ 14:00:00

## 2021-08-08 MED ADMIN — insulin glargine (LANTUS) injection 40 Units: 40 [IU] | SUBCUTANEOUS | @ 02:00:00

## 2021-08-08 MED ADMIN — guaiFENesin (ROBITUSSIN) oral syrup: 100 mg | ORAL | @ 20:00:00

## 2021-08-08 MED ADMIN — insulin lispro (HumaLOG) injection 22 Units: 22 [IU] | SUBCUTANEOUS | @ 23:00:00

## 2021-08-08 MED ADMIN — enoxaparin (LOVENOX) syringe 40 mg: 40 mg | SUBCUTANEOUS | @ 14:00:00

## 2021-08-08 MED ADMIN — menthol (COUGH DROPS) lozenge 1 lozenge: 1 | ORAL | @ 16:00:00

## 2021-08-08 MED ADMIN — acetaminophen (TYLENOL) tablet 1,000 mg: 1000 mg | ORAL | @ 20:00:00

## 2021-08-08 MED ADMIN — insulin glargine (LANTUS) injection 44 Units: 44 [IU] | SUBCUTANEOUS | @ 14:00:00

## 2021-08-08 MED ADMIN — menthol (COUGH DROPS) lozenge 1 lozenge: 1 | ORAL | @ 11:00:00

## 2021-08-08 MED ADMIN — bumetanide (BUMEX) tablet 1 mg: 1 mg | ORAL | @ 11:00:00

## 2021-08-08 MED ADMIN — menthol (COUGH DROPS) lozenge 1 lozenge: 1 | ORAL | @ 20:00:00

## 2021-08-08 MED ADMIN — insulin lispro (HumaLOG) injection 0-20 Units: 0-20 [IU] | SUBCUTANEOUS | @ 23:00:00

## 2021-08-08 NOTE — Unmapped (Signed)
Problem: Rehabilitation (IRF) Plan of Care  Goal: Plan of Care Review  Outcome: Progressing  Goal: Patient-Specific Goal (Individualized)  Outcome: Progressing  Goal: Absence of New-Onset Illness or Injury  Outcome: Progressing  Intervention: Prevent Fall and Fall Injury  Recent Flowsheet Documentation  Taken 08/08/2021 0600 by Barbaraann Cao, RN  Safety Interventions: fall reduction program maintained  Taken 08/08/2021 0400 by Barbaraann Cao, RN  Safety Interventions: fall reduction program maintained  Taken 08/08/2021 0200 by Barbaraann Cao, RN  Safety Interventions: fall reduction program maintained  Taken 08/08/2021 0000 by Barbaraann Cao, RN  Safety Interventions: fall reduction program maintained  Taken 08/07/2021 2200 by Barbaraann Cao, RN  Safety Interventions: fall reduction program maintained  Taken 08/07/2021 2000 by Barbaraann Cao, RN  Safety Interventions: fall reduction program maintained  Goal: Optimal Comfort and Wellbeing  Outcome: Progressing  Goal: Home and Community Transition Plan Established  Outcome: Progressing

## 2021-08-08 NOTE — Unmapped (Signed)
[facility] Nephrology and Hypertension  Native Kidney Biopsy Indication Note    Date of Biopsy Referral: 08/07/21       Referring Provider: Rowe Pavy  Phone: [phone]      Biopsy Fellow  _____________________ at _______________________ otherwise contact referring provider.     (NAME)   (CONTACT - pager/cell phone #)    (Please circle Yes or No below if a research core is obtained at the time of biopsy)    RESEARCH CORE?   -    YES   /   NO    If YES, this patient is enrolled in a research study (e.g. TRIDENT, NEPTUNE) with an additional research tissue core collected. This additional core will be available if needed for light microscopy for 48 hours before being frozen (72 hours if biopsy completed on a Friday).     IF THE ADDITIONAL CORE IS NEEDED FOR CLINICAL DIAGNOSTIC EVALUATION, PLEASE CONTACT [facility] SURGICAL PATHOLOGY AT [phone] WITHIN THE TIME PERIOD NOTED ABOVE.       ---------------------------------------------------------------------------------------------------------------------  PLEASE INDICATED BIOPSY URGENCY:  Routine     Patient Name: Summer Russell  MR: [id number]  Age: 46 y.o.  Sex: Female Race: White Ethnicity: Non-Hispanic of Latino/a   DOB:Sep 14, 1975    Procedures: Interventional Radiology Percutaneous Kidney Biopsy under Moderate Sedation  Tissue Submitted: Kidney  Special Studies Required: LM, IF, EM  ----------------------------------------------------------------------------------------------------------------------  History/Clinical Diagnosis/Indication for Biopsy:   46 y/o woman with history of uncontrolled T2DM, hypothyroidism, psoriasis admitted for weakness after longstanding infliximab infusion. Found to have nephrotic syndrome with UPC of 14.4 and UACR of 7643. Had UAC of 243 in 2014.    Serologic workup negative (PLA2R Ab, THSD7A Ab, see rest below) except did have positive ANA homogeneous 1:640, but negative anti-dsDNA. Purpose of biopsy is to find etiology of nephrotic syndrome (diabetes vs. other cause).      ----------------------------------------------------------------------------------------------------------------------  Signs and symptoms:  Blood Pressure: 152/81  On Anti-Hypertensives: ACE-I started during admission  Edema: Yes  Arthritis/Arthralgias: No  Skin Lesions: No  Other: None    ----------------------------------------------------------------------------------------------------------------------  Laboratory Data:   Please delete any values prior to 6 months    Urine Sediment:   Dysmorphic RBCs: No  RBC casts: No  Other findings: fatty casts, granular debris    Urine protein/creatinine ratio:   Lab Results   Component Value Date    Protein/Creatinine Ratio, Urine 14.134 07/25/2021      Urine albumin/creatinine ratio:   Lab Results   Component Value Date    Albumin/Creatinine Ratio 7,643.8 (H) 07/25/2021        Lab Results   Component Value Date    Color, UA Yellow 07/24/2021    Color, UA Yellow 05/26/2012    Specific Gravity, UA 1.034 (H) 07/24/2021    Specific Gravity, UA 1.021 05/26/2012    pH, UA 6.0 07/24/2021    pH, UA 5.0 05/26/2012    Glucose, UA >1000 mg/dL (A) 84/69/6295    Glucose, UA +4 05/26/2012    Ketones, UA Trace (A) 07/24/2021    Ketones, UA NEGATIVE 05/26/2012    Blood, UA Small (A) 07/24/2021    Blood, UA NEGATIVE 05/26/2012    Nitrite, UA Negative 07/24/2021    Nitrite, UA NEGATIVE 05/26/2012    Leukocyte Esterase, UA Negative 07/24/2021    Leukocyte Esterase, UA +2 05/26/2012    Urobilinogen, UA <2.0 mg/dL 28/41/3244    Urobilinogen, UA 1 05/26/2012    Bilirubin, UA Negative 07/24/2021  Bilirubin, UA NEGATIVE 05/26/2012    WBC, UA 43 (H) 05/26/2012    RBC, UA 13 (H) 05/26/2012     Lab Results   Component Value Date    Sodium 140 08/07/2021    Sodium 140 06/02/2012    Potassium 4.3 08/07/2021    Potassium 4.2 06/02/2012    Chloride 106 08/07/2021    Chloride 96 (L) 06/02/2012    CO2 24.5 08/07/2021    CO2 31 (H) 06/02/2012    BUN 18 08/07/2021    BUN 18 06/02/2012    Creatinine 0.98 (H) 08/07/2021    Creatinine 0.69 06/02/2012    EGFR CKD-EPI African American, Female >90 12/06/2020    EGFR CKD-EPI Non-African American, Female >90 12/06/2020    Glucose 216 (H) 08/07/2021    T Albumin 2.8 (L) 07/25/2021    Albumin 2.0 (L) 07/30/2021    Albumin 3.0 (L) 05/27/2012    Cholesterol 258 (H) 07/25/2021    Cholesterol, Total 162 04/16/2012     Lab Results   Component Value Date    Phospholipase A2 Receptor AB, IFA Negative 07/31/2021    Phospholipase A2 Receptor AB, ELISA <2 07/31/2021    C3 Complement 103 07/25/2021    C4 Complement 9.1 (L) 07/25/2021    dsDNA Ab Negative 07/25/2021    Antinuclear Antibodies (ANA) Positive (A) 07/25/2021    Hep B Core Total Ab Nonreactive 07/25/2021    Hepatitis C Ab Nonreactive 07/25/2021    HGB A1C, POC 9.2 (H) 04/10/2012    Hemoglobin A1C 10.9 (H) 07/25/2021       Relevant Outside Laboratory Data:  None    Lupus Nephritis or ANCA Vasculitis:  Suspected Lupus or ANCA? No    NEPTUNE Criteria:  Is the patient scheduled for a kidney biopsy with an anticipated diagnosis of FSGS, Minimal Change, Membranous Nephropathy, or Alport's Syndrome?: NO      TRIDENT Criteria:   Does he/she have type 1 or type 2 diabetes?:  Yes

## 2021-08-08 NOTE — Unmapped (Signed)
Family Medicine Inpatient Service  Brief Consult Note    ASSESSMENT/ PLAN:   Summer Russell is a 46 y.o. 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     # CKD I nephrotic range proteinuria I volume overload: Patient has long-standing history of uncontrolled DM, HTN and subsequent CKD w/ albuminuria. 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 Though, recommend kidney biopsy for further evaluation. Nephrology signed off 12/27 but plans to follow outpatient. Unclear, but seems patient has history of CKD w/ proteinuria ~10 years prior as well. PMR requesting consultation for resumption of diuretics. Subjectively patient reporting worse LE and abdominal edema. Recent Echo w/ normal EF. Most recent BMP with Cr 1.12, normal GFR.   - Continue bumex 1mg  PO BID; excellent UOP  - Okay to continue ACEi and SGLT2i   - Strict intake/output  - Start compression stockings  - Daily weights   - Discussed restricting diet, so 2g Na and 2L fluid restricted diet the patient doesn't feel is reasonable especially to do both Na, fluid restriction and consistent carb  - Daily BMP, Mg while diuresing  [ ]  Reschedule outpatient nephrology follow-up appointment (currently scheduled on 08/11/2020)  [ ]  Agree with coordination of outpatient kidney biopsy with VIR prior to discharge    #Poorly controlled IDDM: Fasting BG reportedly 427 by RN this morning -- though not yet documented. Voiced patient ate carb loaded dinner. Pre-prandial not yet at goal <180. On Dec 31st patient had 68 units basal insulin and 72 units lispro without BG at goal. Will plan to increase both basal and short acting insulin today. Would defer from carb restricting diet to best assess for patient's typical diet habits and true insulin needs.     Family medicine will continue to monitor BG and make recommendations accordingly.     Recommendations:  1. INCREASE Glargine to 44 units BID  2. INCREASE Lispro to 22 units AC  3. Continue Resistant SSI   4. Continue Jardiance as SGLT2i is recommended in diabetic patients with CKD.   5. Continue ACEi for similar indication.   6. Consider carb restricted diet -- though may not reflect true insulin needs   7. Re-check A1c ~3 months  8. Agree with endocrinology referral at discharge  9. Agree with stopping Metformin given CKD  10. Consider addition of GLP1-agonist inpatient vs outpatient for glycemic control and weight loss (patient would prefer daily vs once weekly injections given difficulties in adherence to once weekly previously)  11. Agree with diabetes education      Please page Family Medicine Admission Pager [phone]) for questions or further consult needs.     HPI:  Summer Russell is a 46 y.o. 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     Pt reports some subjective improvement in swelling and UOP after increasing bumex. Patient is agreeable to wear compression stockings. She reports being  unable to elevate her legs in bed due to discomfort.     MEDICATIONS/ ALLERGIES:  No current facility-administered medications on file prior to encounter.     Current Outpatient Medications on File Prior to Encounter   Medication Sig   ??? ascorbic Acid (VITAMIN C) 500 mg CpER Take 500 mg by mouth.   ??? aspirin (ECOTRIN) 81 MG tablet Take 81 mg by mouth daily.   ??? atorvastatin (LIPITOR) 20 MG tablet Take 20 mg by mouth daily.   ??? BD INSULIN SYRINGE ULTRA-FINE 1 mL 31 gauge x 5/16 (8 mm) Syrg USE 2 (TWO) TIMES DAILY AS DIRECTED   ??? blood sugar diagnostic Strp Frequency:PHARMDIR   Dosage:0.0     Instructions:  Note:Use to test blood sugar in the morning before breakfast and in the evening two hours after dinner, ISD-9 250.00. Dose: 1   ??? blood-glucose meter Misc U QID UTD   ??? cetirizine (ZYRTEC) 10 MG tablet Take 10 mg by mouth daily.   ??? cholecalciferol, vitamin D3, 1,000 unit (25 mcg) tablet Take 1,000 Units by mouth.   ??? clindamycin (CLEOCIN) 300 MG capsule TAKE 1 CAPSULE BY MOUTH TWO TIMES A DAY.   ??? empagliflozin (JARDIANCE) 10 mg tablet Take 1 tablet (10 mg total) by mouth daily.   ??? empty container Misc Use as directed to dispose of Cosentyx pens.   ??? fenofibrate (TRICOR) 48 MG tablet Take 1 tablet (48 mg total) by mouth daily.   ??? finasteride (PROSCAR) 5 mg tablet TAKE 1 TABLET BY MOUTH EVERY DAY   ??? fluconazole (DIFLUCAN) 150 MG tablet TAKE 1 TAB NOW AN THEN TAKE 1 TABLET ONCE WEEKLY AS NEEDED FOR YEAST INFECTION   ??? gabapentin (NEURONTIN) 300 MG capsule Take 1 capsule (300 mg total) by mouth Two (2) times a day.   ??? insulin glargine (LANTUS) 100 unit/mL injection Inject 0.6 mL (60 Units total) under the skin nightly.   ??? insulin lispro (HUMALOG) 100 unit/mL injection Inject 0.1 mL (10 Units total) under the skin Three (3) times a day before meals.   ??? levothyroxine (SYNTHROID, LEVOTHROID) 50 MCG tablet Take by mouth. Taking on Monday Wednesday Friday, Saturday & Sunday and on Tuesday & Thursday   ??? lidocaine (XYLOCAINE) 5 % ointment Apply to painful areas every 3-4 hours as needed   ??? lisinopriL (PRINIVIL,ZESTRIL) 40 MG tablet Take 1 tablet (40 mg total) by mouth daily.   ??? multivitamin capsule Take 1 capsule by mouth daily.   ??? pantoprazole (PROTONIX) 40 MG tablet Take 40 mg by mouth.   ??? pen needle, diabetic 31 gauge x 5/16 Ndle Use 1 needle to skin four times daily as directed   ??? secukinumab (COSENTYX) 150 mg/mL PnIj injection Inject the contents of 2 pens (300 mg total) under the skin every fourteen (14) days as maintenance doses.  START 14 days after last loading dose.     Allergies   Allergen Reactions   ??? [employer] Other (See Comments)   ??? Lamictal [Lamotrigine] Rash   ??? Oxycodone Nausea And Vomiting       PHYSICAL EXAM:  Vitals:    08/08/21 0615   BP: 132/82   Pulse: 119   Resp: 20   Temp: 36.4 ??C (97.5 ??F)   SpO2: 95%     GEN: sitting up in chair, NAD, well appearing  EYES: sclera anicteric, conjunctiva clear   HENT: NCAT  RESP: normal work of breathing on RA  EXT: +1 pitting edema  up to mid-tibia BL  NEURO: Awake and alert, answers questions appropriately    Raynelle Fanning, MD

## 2021-08-08 NOTE — Unmapped (Signed)
Physical Medicine and Rehabilitation  Daily Progress Note [facility]    ASSESSMENT:     Summer Russell is a 46 y.o. female with PMHx of HTN, HLD, GERD, uncontrolled type 2 diabetes, polyneuropathy, hypothyroidism, ??psoriasis complicated by arthritis (on long-standing infliximab infusions), fibromyalgia, and sleep apnea??admitted to [facility] 12/20 for severe hyperglycemia and lactic acidosis and nephrotic range proteinuria along with worsening left sided weakness, urinary retention and saddle anesthesia.??  ??  Impairment Group Code: (Neurologic Conditions) 03.3 Polyneuropathy    PLAN:     REHAB:   - PT and OT to maximize functional status with mobility and ADLs as well as prevention of joint contracture   - RT for community re-integration, relaxation, and Support Group  - P&O for assistive devices prn  - Pharmacy consult for patient and family education on medication management   - Patient will be discussed at next interdisciplinary team conference    ??  Lower extremity weakness/Sensory deficits: MRI brain negative (12/24). Patient unable to tolerate close spaces so spine MRI not obtainable. Patient may have lumbosacral plexopathy (such as diabetic amyotrophy vs polyneuropathy) in setting of known neuropathy, so EMG/NCS could be considered, however due to body habitus, may be more challenging to perform. Deferred EMG inpatient as symptoms improved with better glucose control.  [ ]  Plan for outpatient referral to neurology to consider outpatient EMG/NCS  Urinary retention:  - Passed TOV  Prophylaxis:  - Lovenox 40 mg BID (weight based)  Pain:  - Tylenol 1000 mg TID prn  ??  Hyperglycemia - Uncontrolled T2DM with insulin dependence: BG elevated to >400 during acute hospitalization (no AG or acidemia to suggest DKA).   - inpatient consult to family medicine for insulin titration/comangement, appreciate recs  - inpatient consult to diabetes education  - Jardiance 10 mg daily??  -??glargine 40U BID (increased 1/1)  - lispro 20U TIDAC (increased 1/1)  - resistant SSI  - Holding metformin  [ ]  plan for outpatient referral to [facility] endocrinology  ??  Nephrotic range proteinuria: Nephrology consulted during acute hospitalization due to AKI, proteinuria and elevated lactate. Suspect due to??diabetic nephropathy, but also sent additional labs (infectious studies, autoimmune work-up) for further work-up.  - Continue lisinopril 40 mg daily and empagliflozin 10 mg daily as tolerated for proteinuria  - HOLD home OCP (ethinyl estradiol/drospirenone), given hypercoagulability associated with nephrotic syndrome per pharmacy recommendation.   - bumex 1mg  BID (increased 1/2)  [ ]  Reschedule outpatient nephrology follow-up appointment (currently scheduled on 08/11/2020)  [ ]  plan to coordinate outpatient kidney biopsy with VIR prior to discharge  ??  Hydradenitis suppurativa - Psoriasis: Managed by dermatology outpatient; on IV Renflexis, finasteride, and clindamycin.??S/p Renflexis infusion 12/19  - Continue home clindamycin 300 mg BID and finasteride 5 mg daily  ??  Vertigo/nausea: zyrtec 10 mg daily, compazine (first line), Zofran (second line), meclizine prn,   Hypothyroidism??- Continue home Synthroid 50 mcg every day + 25 mcg T/Th  HTN - Lisinopril and Bumex as above   HLD - Continue home Lipitor 20 mg daily, fenofibrate 48 mg daily, ASA 81 mg daily  Sleep Apnea - Does not tolerate CPAP at bedtime  GERD??- Continue home protonix 40 mg daily  Rhinitis - Cough - lozenges, robitussin, flonase prn  ??  DISPO: Admitted to Rehab floor, patient will be discussed at next interdisciplinary team conference   EDD: 1/10  Follow up: PCP, neurology referral, nephrology, endocrinology referral       SUBJECTIVE:     Interval  Events: NAEON. Patient feels as though swelling in legs is slightly improved but that swelling in abdomen is the same as prior. Discussed with family medicine and will increase bumex to BID dosing. Insulin regimen per family medicine recs.    OBJECTIVE: Vital signs (last 24 hours):  Temp:  [36.7 ??C (98.1 ??F)] 36.7 ??C (98.1 ??F)  Heart Rate:  [89-100] 100  Resp:  [16-18] 16  BP: (120-136)/(63-70) 120/63  MAP (mmHg):  [75] 75  SpO2:  [98 %-99 %] 99 %    Intake/Output (last 3 shifts):  I/O last 3 completed shifts:  In: 4377 [P.O.:4377]  Out: 3793 [Urine:3793]    Physical Exam:  GEN: sitting up in bed  EYES: sclera anicteric, conjunctiva clear   HENT: NCAT  NECK: trachea midline  RESP: NWOB on RA  SKIN: no visible rashes  MSK: no notable contractures  NEURO: Awake and alert, answers questions appropriately    Medications:  Scheduled   ??? aspirin chewable tablet 81 mg Daily   ??? atorvastatin (LIPITOR) tablet 20 mg Daily   ??? bumetanide (BUMEX) tablet 1 mg BID   ??? cetirizine (ZyrTEC) tablet 10 mg Daily   ??? cholecalciferol (vitamin D3 25 mcg (1,000 units)) tablet 25 mcg Daily   ??? clindamycin (CLEOCIN) capsule 300 mg BID   ??? diclofenac sodium (VOLTAREN) 1 % gel 2 g QID   ??? empagliflozin (JARDIANCE) tablet 10 mg Daily   ??? enoxaparin (LOVENOX) syringe 40 mg Q12H   ??? fenofibrate (TRICOR) tablet 48 mg Daily   ??? finasteride (PROSCAR) tablet 5 mg Daily   ??? insulin glargine (LANTUS) injection 40 Units BID   ??? insulin lispro (HumaLOG) injection 0-20 Units ACHS   ??? insulin lispro (HumaLOG) injection 20 Units TID AC   ??? levothyroxine (SYNTHROID) tablet 50 mcg Once per day on Sun Mon Wed Fri Sat   ??? levothyroxine (SYNTHROID) tablet 75 mcg Once per day on Tue Thu   ??? lisinopriL (PRINIVIL,ZESTRIL) tablet 40 mg Daily   ??? nystatin (MYCOSTATIN) powder 1 application BID   ??? pantoprazole (PROTONIX) EC tablet 40 mg Daily     PRN acetaminophen, 1,000 mg, Q8H PRN  bismuth subsalicylate, 30 mL, Q6H PRN  calcium carbonate, 200 mg of elem calcium, TID PRN  dextrose, 12.5 g, Q15 Min PRN  fluticasone propionate, 1 spray, Daily PRN  glucagon, 1 mg, Once PRN  glucose, 16 g, Q10 Min PRN  guaiFENesin, 100 mg, Q4H PRN  hydrocortisone, 25 mg, Daily PRN  hydrOXYzine, 25 mg, Q6H PRN  meclizine, 25 mg, TID PRN  cough/sore throat lozenge, 1 lozenge, Q2H PRN  prochlorperazine, 5 mg, Q6H PRN   And  ondansetron, 4 mg, BID PRN  polyethylene glycol, 17 g, Daily PRN  simethicone, 80 mg, TID PRN  traZODone, 50 mg, Nightly PRN        Labs/Studies: Reviewed.    Radiology Results: Reviewed.

## 2021-08-08 NOTE — Unmapped (Signed)
Patient has denied any complaints of pain during this shift. Voiding with use of bedside commode, last BM 08/07/21. Tolerating diet well. Patient states that fluid to abdomen has increased, MD aware.     Problem: Rehabilitation (IRF) Plan of Care  Goal: Plan of Care Review  Outcome: Progressing  Goal: Patient-Specific Goal (Individualized)  Outcome: Progressing  Goal: Absence of New-Onset Illness or Injury  Outcome: Progressing  Intervention: Prevent Fall and Fall Injury  Recent Flowsheet Documentation  Taken 08/07/2021 0830 by Manning Charity, RN  Safety Interventions:   fall reduction program maintained   low bed   nonskid shoes/slippers when out of bed  Intervention: Prevent VTE (Venous Thromboembolism)  Recent Flowsheet Documentation  Taken 08/07/2021 0830 by Clotilde Dieter Eleftherios Dudenhoeffer, RN  VTE Prevention/Management: anticoagulant therapy  Goal: Optimal Comfort and Wellbeing  Outcome: Progressing  Goal: Home and Community Transition Plan Established  Outcome: Progressing     Problem: Impaired Wound Healing  Goal: Optimal Wound Healing  Outcome: Progressing     Problem: Self-Care Deficit  Goal: Improved Ability to Complete Activities of Daily Living  Outcome: Progressing     Problem: Skin Injury Risk Increased  Goal: Skin Health and Integrity  Outcome: Progressing  Intervention: Optimize Skin Protection  Recent Flowsheet Documentation  Taken 08/07/2021 1600 by Clotilde Dieter Fey Coghill, RN  Pressure Reduction Techniques: frequent weight shift encouraged  Taken 08/07/2021 1400 by Clotilde Dieter Caylen Yardley, RN  Pressure Reduction Techniques: frequent weight shift encouraged  Taken 08/07/2021 1200 by Clotilde Dieter Eleonor Ocon, RN  Pressure Reduction Techniques: frequent weight shift encouraged  Taken 08/07/2021 0830 by Pauletta Browns I Sheree Lalla, RN  Pressure Reduction Techniques: frequent weight shift encouraged     Problem: Hypertension Comorbidity  Goal: Blood Pressure in Desired Range  Outcome: Progressing     Problem: Fluid Volume Excess  Goal: Fluid Balance  Outcome: Progressing

## 2021-08-09 LAB — BASIC METABOLIC PANEL
ANION GAP: 8 mmol/L (ref 5–14)
BLOOD UREA NITROGEN: 23 mg/dL (ref 9–23)
BUN / CREAT RATIO: 20
CALCIUM: 8.9 mg/dL (ref 8.7–10.4)
CHLORIDE: 107 mmol/L (ref 98–107)
CO2: 25.5 mmol/L (ref 20.0–31.0)
CREATININE: 1.13 mg/dL — ABNORMAL HIGH
EGFR CKD-EPI (2021) FEMALE: 62 mL/min/{1.73_m2} (ref >=60)
GLUCOSE RANDOM: 301 mg/dL — ABNORMAL HIGH (ref 70–179)
POTASSIUM: 4.5 mmol/L (ref 3.4–4.8)
SODIUM: 140 mmol/L (ref 135–145)

## 2021-08-09 LAB — CBC
HEMATOCRIT: 29.9 % — ABNORMAL LOW (ref 34.0–44.0)
HEMOGLOBIN: 10.5 g/dL — ABNORMAL LOW (ref 11.3–14.9)
MEAN CORPUSCULAR HEMOGLOBIN CONC: 35 g/dL (ref 32.0–36.0)
MEAN CORPUSCULAR HEMOGLOBIN: 31.7 pg (ref 25.9–32.4)
MEAN CORPUSCULAR VOLUME: 90.5 fL (ref 77.6–95.7)
MEAN PLATELET VOLUME: 8.2 fL (ref 6.8–10.7)
PLATELET COUNT: 239 10*9/L (ref 150–450)
RED BLOOD CELL COUNT: 3.3 10*12/L — ABNORMAL LOW (ref 3.95–5.13)
RED CELL DISTRIBUTION WIDTH: 13.7 % (ref 12.2–15.2)
WBC ADJUSTED: 3.4 10*9/L — ABNORMAL LOW (ref 3.6–11.2)

## 2021-08-09 LAB — MAGNESIUM: MAGNESIUM: 1.7 mg/dL (ref 1.6–2.6)

## 2021-08-09 MED ADMIN — nystatin (MYCOSTATIN) powder 1 application: 1 | TOPICAL | @ 03:00:00

## 2021-08-09 MED ADMIN — insulin glargine (LANTUS) injection 44 Units: 44 [IU] | SUBCUTANEOUS | @ 15:00:00 | Stop: 2021-08-09

## 2021-08-09 MED ADMIN — pantoprazole (PROTONIX) EC tablet 40 mg: 40 mg | ORAL | @ 15:00:00

## 2021-08-09 MED ADMIN — bumetanide (BUMEX) tablet 1 mg: 1 mg | ORAL | @ 18:00:00

## 2021-08-09 MED ADMIN — guaiFENesin (ROBITUSSIN) oral syrup: 100 mg | ORAL | @ 23:00:00

## 2021-08-09 MED ADMIN — insulin lispro (HumaLOG) injection 0-20 Units: 0-20 [IU] | SUBCUTANEOUS | @ 23:00:00

## 2021-08-09 MED ADMIN — menthol (COUGH DROPS) lozenge 1 lozenge: 1 | ORAL | @ 13:00:00

## 2021-08-09 MED ADMIN — bumetanide (BUMEX) tablet 1 mg: 1 mg | ORAL | @ 11:00:00

## 2021-08-09 MED ADMIN — insulin lispro (HumaLOG) injection 0-20 Units: 0-20 [IU] | SUBCUTANEOUS | @ 03:00:00

## 2021-08-09 MED ADMIN — insulin lispro (HumaLOG) injection 22 Units: 22 [IU] | SUBCUTANEOUS | @ 18:00:00

## 2021-08-09 MED ADMIN — clindamycin (CLEOCIN) capsule 300 mg: 300 mg | ORAL | @ 15:00:00 | Stop: 2021-08-22

## 2021-08-09 MED ADMIN — nystatin (MYCOSTATIN) powder 1 application: 1 | TOPICAL | @ 15:00:00

## 2021-08-09 MED ADMIN — finasteride (PROSCAR) tablet 5 mg: 5 mg | ORAL | @ 15:00:00

## 2021-08-09 MED ADMIN — acetaminophen (TYLENOL) tablet 1,000 mg: 1000 mg | ORAL | @ 16:00:00

## 2021-08-09 MED ADMIN — insulin glargine (LANTUS) injection 44 Units: 44 [IU] | SUBCUTANEOUS | @ 03:00:00

## 2021-08-09 MED ADMIN — empagliflozin (JARDIANCE) tablet 10 mg: 10 mg | ORAL | @ 15:00:00

## 2021-08-09 MED ADMIN — insulin lispro (HumaLOG) injection 0-20 Units: 0-20 [IU] | SUBCUTANEOUS | @ 18:00:00

## 2021-08-09 MED ADMIN — fenofibrate (TRICOR) tablet 48 mg: 48 mg | ORAL | @ 15:00:00

## 2021-08-09 MED ADMIN — guaiFENesin (ROBITUSSIN) oral syrup: 100 mg | ORAL | @ 03:00:00

## 2021-08-09 MED ADMIN — insulin lispro (HumaLOG) injection 0-20 Units: 0-20 [IU] | SUBCUTANEOUS | @ 12:00:00

## 2021-08-09 MED ADMIN — cholecalciferol (vitamin D3 25 mcg (1,000 units)) tablet 25 mcg: 25 ug | ORAL | @ 15:00:00

## 2021-08-09 MED ADMIN — insulin lispro (HumaLOG) injection 22 Units: 22 [IU] | SUBCUTANEOUS | @ 23:00:00

## 2021-08-09 MED ADMIN — acetaminophen (TYLENOL) tablet 1,000 mg: 1000 mg | ORAL | @ 08:00:00

## 2021-08-09 MED ADMIN — clindamycin (CLEOCIN) capsule 300 mg: 300 mg | ORAL | @ 03:00:00 | Stop: 2021-08-22

## 2021-08-09 MED ADMIN — aspirin chewable tablet 81 mg: 81 mg | ORAL | @ 15:00:00

## 2021-08-09 MED ADMIN — menthol (COUGH DROPS) lozenge 1 lozenge: 1 | ORAL | @ 08:00:00

## 2021-08-09 MED ADMIN — menthol (COUGH DROPS) lozenge 1 lozenge: 1 | ORAL | @ 23:00:00

## 2021-08-09 MED ADMIN — lisinopriL (PRINIVIL,ZESTRIL) tablet 40 mg: 40 mg | ORAL | @ 15:00:00

## 2021-08-09 MED ADMIN — enoxaparin (LOVENOX) syringe 40 mg: 40 mg | SUBCUTANEOUS | @ 15:00:00

## 2021-08-09 MED ADMIN — levothyroxine (SYNTHROID) tablet 50 mcg: 50 ug | ORAL | @ 11:00:00

## 2021-08-09 MED ADMIN — menthol (COUGH DROPS) lozenge 1 lozenge: 1 | ORAL | @ 16:00:00

## 2021-08-09 MED ADMIN — cetirizine (ZyrTEC) tablet 10 mg: 10 mg | ORAL | @ 15:00:00

## 2021-08-09 MED ADMIN — atorvastatin (LIPITOR) tablet 20 mg: 20 mg | ORAL | @ 15:00:00

## 2021-08-09 MED ADMIN — guaiFENesin (ROBITUSSIN) oral syrup: 100 mg | ORAL | @ 08:00:00

## 2021-08-09 MED ADMIN — menthol (COUGH DROPS) lozenge 1 lozenge: 1 | ORAL | @ 11:00:00

## 2021-08-09 MED ADMIN — menthol (COUGH DROPS) lozenge 1 lozenge: 1 | ORAL | @ 03:00:00

## 2021-08-09 MED ADMIN — insulin lispro (HumaLOG) injection 22 Units: 22 [IU] | SUBCUTANEOUS | @ 13:00:00

## 2021-08-09 MED ADMIN — guaiFENesin (ROBITUSSIN) oral syrup: 100 mg | ORAL | @ 15:00:00

## 2021-08-09 MED ADMIN — enoxaparin (LOVENOX) syringe 40 mg: 40 mg | SUBCUTANEOUS | @ 03:00:00

## 2021-08-09 NOTE — Unmapped (Signed)
Physical Medicine and Rehabilitation  Daily Progress Note [facility]    ASSESSMENT:   Summer Russell is a 46 y.o. female with PMHx of HTN, HLD, GERD, uncontrolled type 2 diabetes, polyneuropathy, hypothyroidism, ??psoriasis complicated by arthritis (on long-standing infliximab infusions), fibromyalgia, and sleep apnea??admitted to [facility] 12/20 for severe hyperglycemia and lactic acidosis and nephrotic range proteinuria along with worsening left sided weakness, urinary retention and saddle anesthesia.??  ??  Impairment Group Code: (Neurologic Conditions) 03.3 Polyneuropathy  PLAN:     REHAB:   - PT and OT to maximize functional status with mobility and ADLs as well as prevention of joint contracture   - RT for community re-integration, relaxation, and Support Group  - P&O for assistive devices prn  - Pharmacy consult for patient and family education on medication management   - Discussed at next interdisciplinary team conference    ??  Lower extremity weakness/Sensory deficits: MRI brain negative (12/24). Patient unable to tolerate close spaces so spine MRI not obtainable. Patient may have lumbosacral plexopathy (such as diabetic amyotrophy vs polyneuropathy) in setting of known neuropathy, so EMG/NCS could be considered, however due to body habitus, may be more challenging to perform. Deferred EMG inpatient as symptoms improved with better glucose control.  [ ]  Plan for outpatient referral to neurology to consider outpatient EMG/NCS  Urinary retention:  - Passed TOV  Prophylaxis:  - Lovenox 40 mg BID (weight based)  Pain:  - Tylenol 1000 mg TID prn  ??  Hyperglycemia - Uncontrolled T2DM with insulin dependence: BG elevated to >400 during acute hospitalization (no AG or acidemia to suggest DKA).   - inpatient consult to family medicine for insulin titration/comangement, appreciate recs  - inpatient consult to diabetes education  - Jardiance 10 mg daily??  -??Increased glargine to 44U BID (increased 1/3)  - Increased lispro to 22U TIDAC (increased 1/3)  - resistant SSI  - Holding metformin given CKD  [ ]  plan for outpatient referral to [facility] endocrinology    RSV: Pt endorsed cough, intermittently productive, sore throat, generalized malaise, and intermittent lightheadedness which developed overnight. Afebrile, O2 sats wnl and other vitals stable. Per pt's spouse, he had similar symptoms during pt's acute admission which have since resolved. Rapid respiratory PCR panel from 1/3 positive for RSV. Pt notified of results and adjustments to therapy plans in setting of isolation precautions to which she was agreeable to.   - Droplet and isolation precautions started, continue until pt asymptomatic and at baseline respiratory status   - Symptomatic care prn   ??  Nephrotic range proteinuria: Nephrology consulted during acute hospitalization due to AKI, proteinuria and elevated lactate. Suspect due to??diabetic nephropathy, but also sent additional labs (infectious studies, autoimmune work-up) for further work-up.  - Continue lisinopril 40 mg daily and empagliflozin 10 mg daily as tolerated for proteinuria  - HOLD home OCP (ethinyl estradiol/drospirenone), given hypercoagulability associated with nephrotic syndrome per pharmacy recommendation.   - Continue bumex 1mg  BID (increased 1/2)  - Daily weights  [ ]  Reschedule outpatient nephrology follow-up appointment (currently scheduled on 08/11/2020)  [ ]  plan to coordinate outpatient kidney biopsy with VIR prior to discharge  ??  Hydradenitis suppurativa - Psoriasis: Managed by dermatology outpatient; on IV Renflexis, finasteride, and clindamycin.??S/p Renflexis infusion 12/19  - Continue home clindamycin 300 mg BID and finasteride 5 mg daily  ??  Vertigo/nausea: zyrtec 10 mg daily, compazine (first line), Zofran (second line), meclizine prn,   Hypothyroidism??- Continue home Synthroid 50 mcg every day +  25 mcg T/Th  HTN - Lisinopril and Bumex as above   HLD - Continue home Lipitor 20 mg daily, fenofibrate 48 mg daily, ASA 81 mg daily  Sleep Apnea - Does not tolerate CPAP at bedtime  GERD??- Continue home protonix 40 mg daily  Rhinitis - Cough - lozenges, robitussin, flonase prn  ??  DISPO: Admitted to Rehab floor, patient will be discussed at next interdisciplinary team conference   EDD: 1/10  Follow up: PCP, neurology referral, nephrology, endocrinology referral     SUBJECTIVE:     Interval Events: NAEON. VSS. Endorses increased swelling of b/l LEs and abdomen over past 72hrs in setting of not being on her home Bumex dosing. Started on Bumex 1mg  BID on 1/2 and states she had an appropriate response with increased urination overnight. Will coninue to obtain daily weights and monitor renal function. Insulin regimen adjusted per FM reccs. Otherwise, doing well and without further questions at this time.   OBJECTIVE:     Vital signs (last 24 hours):  Temp:  [36.4 ??C (97.5 ??F)] 36.4 ??C (97.5 ??F)  Heart Rate:  [116-119] 116  Resp:  [20] 20  BP: (130-132)/(82) 130/82  MAP (mmHg):  [95] 95  SpO2:  [95 %-98 %] 98 %    Intake/Output (last 3 shifts):  I/O last 3 completed shifts:  In: 2997 [P.O.:2997]  Out: 4963 [Urine:4963]    Physical Exam:  GEN: NAD, sitting up in chair  EYES: sclera anicteric, conjunctiva clear   HENT: NCAT  NECK: trachea midline  RESP: NWOB on RA  SKIN: no visible rashes or skin breakdown  MSK: no notable contractures; b/l LE non-pitting edema to knees   NEURO: Awake and alert, answers questions appropriately    Medications:  Scheduled   ??? aspirin chewable tablet 81 mg Daily   ??? atorvastatin (LIPITOR) tablet 20 mg Daily   ??? bumetanide (BUMEX) tablet 1 mg BID   ??? cetirizine (ZyrTEC) tablet 10 mg Daily   ??? cholecalciferol (vitamin D3 25 mcg (1,000 units)) tablet 25 mcg Daily   ??? diclofenac sodium (VOLTAREN) 1 % gel 2 g QID   ??? empagliflozin (JARDIANCE) tablet 10 mg Daily   ??? enoxaparin (LOVENOX) syringe 40 mg Q12H   ??? fenofibrate (TRICOR) tablet 48 mg Daily   ??? finasteride (PROSCAR) tablet 5 mg Daily   ??? insulin glargine (LANTUS) injection 44 Units BID   ??? insulin lispro (HumaLOG) injection 0-20 Units ACHS   ??? insulin lispro (HumaLOG) injection 22 Units TID AC   ??? levothyroxine (SYNTHROID) tablet 50 mcg Once per day on Sun Mon Wed Fri Sat   ??? levothyroxine (SYNTHROID) tablet 75 mcg Once per day on Tue Thu   ??? lisinopriL (PRINIVIL,ZESTRIL) tablet 40 mg Daily   ??? nystatin (MYCOSTATIN) powder 1 application BID   ??? pantoprazole (PROTONIX) EC tablet 40 mg Daily     PRN acetaminophen, 1,000 mg, Q8H PRN  bismuth subsalicylate, 30 mL, Q6H PRN  calcium carbonate, 200 mg of elem calcium, TID PRN  dextrose, 12.5 g, Q15 Min PRN  fluticasone propionate, 1 spray, Daily PRN  glucagon, 1 mg, Once PRN  glucose, 16 g, Q10 Min PRN  guaiFENesin, 100 mg, Q4H PRN  hydrocortisone, 25 mg, Daily PRN  hydrOXYzine, 25 mg, Q6H PRN  meclizine, 25 mg, TID PRN  cough/sore throat lozenge, 1 lozenge, Q2H PRN  prochlorperazine, 5 mg, Q6H PRN   And  ondansetron, 4 mg, BID PRN  polyethylene glycol, 17 g, Daily PRN  simethicone,  80 mg, TID PRN  traZODone, 50 mg, Nightly PRN      Labs/Studies: Reviewed.    Radiology Results: Reviewed.

## 2021-08-09 NOTE — Unmapped (Signed)
Physical Medicine and Rehabilitation  Daily Progress Note [facility]    ASSESSMENT:   Summer Russell is a 46 y.o. female with PMHx of HTN, HLD, GERD, uncontrolled type 2 diabetes, polyneuropathy, hypothyroidism, psoriasis complicated by arthritis (on long-standing infliximab infusions), fibromyalgia, and sleep apnea??admitted to [facility] 12/20 for severe hyperglycemia and lactic acidosis and nephrotic range proteinuria along with worsening left sided weakness, urinary retention and saddle anesthesia.??  ??  Impairment Group Code: (Neurologic Conditions) 03.3 Polyneuropathy  PLAN:     REHAB:   - PT and OT to maximize functional status with mobility and ADLs as well as prevention of joint contracture   - RT for community re-integration, relaxation, and Support Group  - Discussed at next interdisciplinary team conference    ??  Lower extremity weakness/Sensory deficits: MRI brain negative (12/24). Patient unable to tolerate close spaces so spine MRI not obtainable. Patient may have lumbosacral plexopathy (such as diabetic amyotrophy vs polyneuropathy) in setting of known neuropathy, so EMG/NCS could be considered, however due to body habitus, may be more challenging to perform. Deferred EMG inpatient as symptoms improved with better glucose control.  [ ]  Plan for outpatient referral to neurology to consider outpatient EMG/NCS  Urinary retention:  - Passed TOV  Prophylaxis:  - Lovenox 40 mg BID (weight based)  Pain:  - Tylenol 1000 mg TID prn  ??  Hyperglycemia - Uncontrolled T2DM with insulin dependence: BG elevated to >400 during acute hospitalization (no AG or acidemia to suggest DKA).   - Inpt consult to FM for insulin titration/comangement, appreciate recs  - Inpt consult to diabetes education  - Continue Jardiance 10 mg daily??  -??Continue glargine 44U BID (increased 1/3)  - Continue lispro 22U TIDAC (increased 1/3)  - resistant SSI  - Holding metformin given CKD  [ ]  plan for outpatient referral to [facility] endocrinology    RSV: Pt endorsed cough, intermittently productive, sore throat, generalized malaise, and intermittent lightheadedness which developed overnight. Afebrile, O2 sats wnl and other vitals stable. Per pt's spouse, he had similar symptoms during pt's acute admission which have since resolved. Rapid respiratory PCR panel from 1/3 positive for RSV. Symptoms managed overnight with prns appropriately, continues to endorse nasal/sinus congestion and cough worse with supine, improved when sitting.    - Droplet and isolation precautions started, continue until pt asymptomatic and at baseline respiratory status   - Symptomatic care prn   ??  Nephrotic range proteinuria - B/l LE Edema: Nephrology consulted during acute hospitalization due to AKI, proteinuria and elevated lactate. Suspect due to??diabetic nephropathy, but also sent additional labs (infectious studies, autoimmune work-up) for further work-up. Mild increase to pt's sCr today 1.13 from 1.03, within her baseline, will CTM given recent restart of BID Bumex dosing.   - Continue lisinopril 40 mg daily and empagliflozin 10 mg daily as tolerated for proteinuria  - HOLD home OCP (ethinyl estradiol/drospirenone), given hypercoagulability associated with nephrotic syndrome per pharmacy recommendation.   - Continue bumex 1mg  BID (increased 1/2)  - Daily standing weights  [ ]  Reschedule outpatient nephrology follow-up appointment (currently scheduled on 08/11/2020)  [ ]  plan to coordinate outpatient kidney biopsy with VIR prior to discharge  ??  Hydradenitis suppurativa - Psoriasis: Managed by dermatology outpatient; on IV Renflexis, finasteride, and clindamycin.??S/p Renflexis infusion 12/19  - Continue home clindamycin 300 mg BID and finasteride 5 mg daily    Chronic Problems:   Vertigo/nausea: zyrtec 10 mg daily, compazine (first line), Zofran (second line), meclizine prn,  Hypothyroidism??- Continue home Synthroid 50 mcg every day + 25 mcg T/Th  HTN - Lisinopril and Bumex as above   HLD - Continue home Lipitor 20 mg daily, fenofibrate 48 mg daily, ASA 81 mg daily  Sleep Apnea - Does not tolerate CPAP at bedtime  GERD??- Continue home protonix 40 mg daily  Rhinitis - Cough - lozenges, robitussin, flonase prn  ??  DISPO: Admitted to Rehab floor, patient will be discussed at next interdisciplinary team conference   EDD: 1/10  Follow up: PCP, neurology referral, nephrology, endocrinology referral     SUBJECTIVE:     Interval Events: NAEON. VSS. Endorses improved swelling of b/l LEs since restarting Bumex. States she urinated almost hourly overnight, subjectively feels her b/l legs are less heavy and taut, so feels she is responding appropriately. Will continue to follow standing weights. Otherwise, feels better compared to yst but still endorses nasal/sinus congestion and cough with associated emesis, worse when supine but improved when trunk elevated/upright. Encouraged to utilize prns for symptoms and elevate HOB to avoid supine position.   OBJECTIVE:     Vital signs (last 24 hours):  Temp:  [36.4 ??C (97.5 ??F)-36.8 ??C (98.2 ??F)] 36.7 ??C (98.1 ??F)  Heart Rate:  [107-116] 108  Resp:  [19-20] 19  BP: (120-130)/(53-82) 127/64  MAP (mmHg):  [69-95] 82  SpO2:  [97 %-99 %] 97 %    Intake/Output (last 3 shifts):  I/O last 3 completed shifts:  In: 40.9 [P.O.:40.9]  Out: 3375 [Urine:3375]    Physical Exam:  GEN: NAD, sitting up in chair  EYES: sclera anicteric, conjunctiva clear   HENT: NCAT  NECK: trachea midline  CV: warm and well perfused extremities   RESP: NWOB on RA  SKIN: no visible rashes or skin breakdown; b/l LE venous stasis changes  MSK: no notable contractures; b/l LE non-pitting edema to knees, slightly improved  NEURO: Awake and alert, answers questions appropriately    Medications:  Scheduled   ??? aspirin chewable tablet 81 mg Daily   ??? atorvastatin (LIPITOR) tablet 20 mg Daily   ??? bumetanide (BUMEX) tablet 1 mg BID   ??? cetirizine (ZyrTEC) tablet 10 mg Daily   ??? cholecalciferol (vitamin D3 25 mcg (1,000 units)) tablet 25 mcg Daily   ??? clindamycin (CLEOCIN) capsule 300 mg BID   ??? diclofenac sodium (VOLTAREN) 1 % gel 2 g QID   ??? empagliflozin (JARDIANCE) tablet 10 mg Daily   ??? enoxaparin (LOVENOX) syringe 40 mg Q12H   ??? fenofibrate (TRICOR) tablet 48 mg Daily   ??? finasteride (PROSCAR) tablet 5 mg Daily   ??? insulin glargine (LANTUS) injection 44 Units BID   ??? insulin lispro (HumaLOG) injection 0-20 Units ACHS   ??? insulin lispro (HumaLOG) injection 22 Units TID AC   ??? levothyroxine (SYNTHROID) tablet 50 mcg Once per day on Sun Mon Wed Fri Sat   ??? levothyroxine (SYNTHROID) tablet 75 mcg Once per day on Tue Thu   ??? lisinopriL (PRINIVIL,ZESTRIL) tablet 40 mg Daily   ??? nystatin (MYCOSTATIN) powder 1 application BID   ??? pantoprazole (PROTONIX) EC tablet 40 mg Daily     PRN acetaminophen, 1,000 mg, Q8H PRN  bismuth subsalicylate, 30 mL, Q6H PRN  calcium carbonate, 200 mg of elem calcium, TID PRN  dextrose, 12.5 g, Q15 Min PRN  fluticasone propionate, 1 spray, Daily PRN  glucagon, 1 mg, Once PRN  glucose, 16 g, Q10 Min PRN  guaiFENesin, 100 mg, Q4H PRN  hydrocortisone, 25 mg, Daily PRN  hydrOXYzine, 25 mg, Q6H PRN  meclizine, 25 mg, TID PRN  cough/sore throat lozenge, 1 lozenge, Q2H PRN  prochlorperazine, 5 mg, Q6H PRN   And  ondansetron, 4 mg, BID PRN  polyethylene glycol, 17 g, Daily PRN  simethicone, 80 mg, TID PRN  traZODone, 50 mg, Nightly PRN      Labs/Studies: Reviewed.    Radiology Results: Reviewed.

## 2021-08-09 NOTE — Unmapped (Signed)
Alert and Oriented x4. Continent of bladder and bowel. LBM 08/07/21. Pain managed with PRN Tylenol. Pt is on contact and droplet precautions due to RSV positive test. Pt has experienced runny nose, cough, sore throat, and nausea. PRN medications given to assist, successful. Safety, droplet, and contact precautions in place and maintained. Hourly rounding performed by nursing staff.       Problem: Rehabilitation (IRF) Plan of Care  Goal: Plan of Care Review  Outcome: Progressing  Goal: Patient-Specific Goal (Individualized)  Outcome: Progressing  Goal: Absence of New-Onset Illness or Injury  Outcome: Progressing  Intervention: Prevent Fall and Fall Injury  Recent Flowsheet Documentation  Taken 08/08/2021 1600 by Fransisco Beau, RN  Safety Interventions: fall reduction program maintained  Taken 08/08/2021 1400 by Fransisco Beau, RN  Safety Interventions: fall reduction program maintained  Taken 08/08/2021 1200 by Fransisco Beau, RN  Safety Interventions: fall reduction program maintained  Taken 08/08/2021 1000 by Fransisco Beau, RN  Safety Interventions: fall reduction program maintained  Taken 08/08/2021 0800 by Fransisco Beau, RN  Safety Interventions: fall reduction program maintained  Goal: Optimal Comfort and Wellbeing  Outcome: Progressing  Goal: Home and Community Transition Plan Established  Outcome: Progressing     Problem: Impaired Wound Healing  Goal: Optimal Wound Healing  Outcome: Progressing     Problem: Self-Care Deficit  Goal: Improved Ability to Complete Activities of Daily Living  Outcome: Progressing     Problem: Skin Injury Risk Increased  Goal: Skin Health and Integrity  Outcome: Progressing  Intervention: Optimize Skin Protection  Recent Flowsheet Documentation  Taken 08/08/2021 1600 by Fransisco Beau, RN  Pressure Reduction Techniques: frequent weight shift encouraged  Taken 08/08/2021 1400 by Fransisco Beau, RN  Pressure Reduction Techniques: frequent weight shift encouraged  Taken 08/08/2021 1200 by Fransisco Beau, RN  Pressure Reduction Techniques: frequent weight shift encouraged  Taken 08/08/2021 1000 by Fransisco Beau, RN  Pressure Reduction Techniques: frequent weight shift encouraged  Head of Bed [facility]) Positioning: HOB at 20-30 degrees  Taken 08/08/2021 0800 by Fransisco Beau, RN  Pressure Reduction Techniques: frequent weight shift encouraged     Problem: Hypertension Comorbidity  Goal: Blood Pressure in Desired Range  Outcome: Progressing     Problem: Fluid Volume Excess  Goal: Fluid Balance  Outcome: Progressing     Problem: Infection  Goal: Absence of Infection Signs and Symptoms  Outcome: Progressing

## 2021-08-09 NOTE — Unmapped (Signed)
A & O x 4 ,continent B & B,on drop let & contact precautions ,prn tylenol,lozenges& robitussin given ,bed low locked ,phone & call bell within reach ,will   Problem: Rehabilitation (IRF) Plan of Care  Goal: Plan of Care Review  Outcome: Progressing  Goal: Patient-Specific Goal (Individualized)  Outcome: Progressing  Goal: Absence of New-Onset Illness or Injury  Outcome: Progressing  Intervention: Prevent Fall and Fall Injury  Recent Flowsheet Documentation  Taken 08/09/2021 0000 by Lanice Schwab, RN  Safety Interventions: fall reduction program maintained  Taken 08/08/2021 2200 by Lanice Schwab, RN  Safety Interventions: fall reduction program maintained  Taken 08/08/2021 2000 by Lanice Schwab, RN  Safety Interventions: fall reduction program maintained  Intervention: Prevent Infection  Recent Flowsheet Documentation  Taken 08/09/2021 0000 by Lanice Schwab, RN  Infection Prevention: hand hygiene promoted  Taken 08/08/2021 2000 by Lanice Schwab, RN  Infection Prevention: hand hygiene promoted  Goal: Optimal Comfort and Wellbeing  Outcome: Progressing  Goal: Home and Community Transition Plan Established  Outcome: Progressing     Problem: Impaired Wound Healing  Goal: Optimal Wound Healing  Outcome: Progressing     Problem: Self-Care Deficit  Goal: Improved Ability to Complete Activities of Daily Living  Outcome: Progressing     Problem: Skin Injury Risk Increased  Goal: Skin Health and Integrity  Outcome: Progressing  Intervention: Optimize Skin Protection  Recent Flowsheet Documentation  Taken 08/09/2021 0000 by Lanice Schwab, RN  Pressure Reduction Techniques: frequent weight shift encouraged  Head of Bed (HOB) Positioning: HOB at 20-30 degrees  Pressure Reduction Devices: positioning supports utilized  Taken 08/08/2021 2200 by Lanice Schwab, RN  Pressure Reduction Techniques: frequent weight shift encouraged  Head of Bed (HOB) Positioning: HOB at 20-30 degrees  Pressure Reduction Devices: positioning supports utilized  Taken 08/08/2021 2000 by Lanice Schwab, RN  Pressure Reduction Techniques: frequent weight shift encouraged  Head of Bed (HOB) Positioning: HOB at 20-30 degrees  Pressure Reduction Devices: positioning supports utilized  Skin Protection: incontinence pads utilized     Problem: Hypertension Comorbidity  Goal: Blood Pressure in Desired Range  Outcome: Progressing     Problem: Fluid Volume Excess  Goal: Fluid Balance  Outcome: Progressing  Intervention: Monitor and Manage Hypervolemia  Recent Flowsheet Documentation  Taken 08/08/2021 2000 by Lanice Schwab, RN  Skin Protection: incontinence pads utilized     Problem: Infection  Goal: Absence of Infection Signs and Symptoms  Outcome: Progressing  Intervention: Prevent or Manage Infection  Recent Flowsheet Documentation  Taken 08/09/2021 0000 by Lanice Schwab, RN  Infection Management: aseptic technique maintained  Taken 08/08/2021 2000 by Lanice Schwab, RN  Infection Management: aseptic technique maintained  Isolation Precautions:   droplet precautions maintained   contact precautions maintained   continue to monitor.

## 2021-08-10 MED ADMIN — insulin lispro (HumaLOG) injection 0-20 Units: 0-20 [IU] | SUBCUTANEOUS | @ 23:00:00

## 2021-08-10 MED ADMIN — cetirizine (ZyrTEC) tablet 10 mg: 10 mg | ORAL | @ 15:00:00

## 2021-08-10 MED ADMIN — menthol (COUGH DROPS) lozenge 1 lozenge: 1 | ORAL | @ 04:00:00

## 2021-08-10 MED ADMIN — insulin lispro (HumaLOG) injection 0-20 Units: 0-20 [IU] | SUBCUTANEOUS | @ 02:00:00

## 2021-08-10 MED ADMIN — cholecalciferol (vitamin D3 25 mcg (1,000 units)) tablet 25 mcg: 25 ug | ORAL | @ 15:00:00

## 2021-08-10 MED ADMIN — sodium chloride (OCEAN) 0.65 % nasal spray 1 spray: 1 | NASAL | @ 21:00:00

## 2021-08-10 MED ADMIN — menthol (COUGH DROPS) lozenge 1 lozenge: 1 | ORAL | @ 20:00:00

## 2021-08-10 MED ADMIN — guaiFENesin (ROBITUSSIN) oral syrup: 100 mg | ORAL | @ 21:00:00

## 2021-08-10 MED ADMIN — insulin lispro (HumaLOG) injection 0-20 Units: 0-20 [IU] | SUBCUTANEOUS | @ 12:00:00

## 2021-08-10 MED ADMIN — finasteride (PROSCAR) tablet 5 mg: 5 mg | ORAL | @ 15:00:00

## 2021-08-10 MED ADMIN — pantoprazole (PROTONIX) EC tablet 40 mg: 40 mg | ORAL | @ 15:00:00

## 2021-08-10 MED ADMIN — menthol (COUGH DROPS) lozenge 1 lozenge: 1 | ORAL | @ 15:00:00

## 2021-08-10 MED ADMIN — insulin lispro (HumaLOG) injection 0-20 Units: 0-20 [IU] | SUBCUTANEOUS | @ 16:00:00

## 2021-08-10 MED ADMIN — insulin glargine (LANTUS) injection 46 Units: 46 [IU] | SUBCUTANEOUS | @ 02:00:00

## 2021-08-10 MED ADMIN — guaiFENesin (ROBITUSSIN) oral syrup: 100 mg | ORAL | @ 04:00:00

## 2021-08-10 MED ADMIN — enoxaparin (LOVENOX) syringe 40 mg: 40 mg | SUBCUTANEOUS | @ 15:00:00

## 2021-08-10 MED ADMIN — insulin lispro (HumaLOG) injection 22 Units: 22 [IU] | SUBCUTANEOUS | @ 13:00:00

## 2021-08-10 MED ADMIN — guaiFENesin (ROBITUSSIN) oral syrup: 100 mg | ORAL | @ 13:00:00 | Stop: 2021-08-10

## 2021-08-10 MED ADMIN — insulin lispro (HumaLOG) injection 22 Units: 22 [IU] | SUBCUTANEOUS | @ 18:00:00

## 2021-08-10 MED ADMIN — nystatin (MYCOSTATIN) powder 1 application: 1 | TOPICAL | @ 15:00:00

## 2021-08-10 MED ADMIN — levothyroxine (SYNTHROID) tablet 75 mcg: 75 ug | ORAL | @ 11:00:00

## 2021-08-10 MED ADMIN — fenofibrate (TRICOR) tablet 48 mg: 48 mg | ORAL | @ 15:00:00

## 2021-08-10 MED ADMIN — bumetanide (BUMEX) tablet 1 mg: 1 mg | ORAL | @ 11:00:00

## 2021-08-10 MED ADMIN — clindamycin (CLEOCIN) capsule 300 mg: 300 mg | ORAL | @ 02:00:00 | Stop: 2021-08-22

## 2021-08-10 MED ADMIN — guaiFENesin (ROBITUSSIN) oral syrup: 100 mg | ORAL | @ 18:00:00

## 2021-08-10 MED ADMIN — insulin glargine (LANTUS) injection 46 Units: 46 [IU] | SUBCUTANEOUS | @ 16:00:00

## 2021-08-10 MED ADMIN — empagliflozin (JARDIANCE) tablet 10 mg: 10 mg | ORAL | @ 15:00:00

## 2021-08-10 MED ADMIN — atorvastatin (LIPITOR) tablet 20 mg: 20 mg | ORAL | @ 15:00:00

## 2021-08-10 MED ADMIN — enoxaparin (LOVENOX) syringe 40 mg: 40 mg | SUBCUTANEOUS | @ 02:00:00

## 2021-08-10 MED ADMIN — insulin lispro (HumaLOG) injection 22 Units: 22 [IU] | SUBCUTANEOUS | @ 23:00:00

## 2021-08-10 MED ADMIN — bumetanide (BUMEX) tablet 1 mg: 1 mg | ORAL | @ 20:00:00

## 2021-08-10 MED ADMIN — aspirin chewable tablet 81 mg: 81 mg | ORAL | @ 15:00:00

## 2021-08-10 MED ADMIN — clindamycin (CLEOCIN) capsule 300 mg: 300 mg | ORAL | @ 15:00:00 | Stop: 2021-08-22

## 2021-08-10 MED ADMIN — nystatin (MYCOSTATIN) powder 1 application: 1 | TOPICAL | @ 02:00:00

## 2021-08-10 MED ADMIN — lisinopriL (PRINIVIL,ZESTRIL) tablet 40 mg: 40 mg | ORAL | @ 15:00:00

## 2021-08-10 NOTE — Unmapped (Signed)
A & O x 4,denies pain,continent B & B,LBM on 1/4, droplet & contact precautions maintained ,prn lozenges& robitussin given for cough, family at bedside, & call bell within reach ,continue to monitor.  Problem: Rehabilitation (IRF) Plan of Care  Goal: Plan of Care Review  Outcome: Progressing  Goal: Patient-Specific Goal (Individualized)  Outcome: Progressing  Goal: Absence of New-Onset Illness or Injury  Outcome: Progressing  Intervention: Prevent Fall and Fall Injury  Recent Flowsheet Documentation  Taken 08/10/2021 0200 by Remi Deter, RN  Safety Interventions: fall reduction program maintained  Taken 08/10/2021 0000 by Remi Deter, RN  Safety Interventions: fall reduction program maintained  Taken 08/09/2021 2200 by Remi Deter, RN  Safety Interventions: fall reduction program maintained  Taken 08/09/2021 2000 by Remi Deter, RN  Safety Interventions: fall reduction program maintained  Intervention: Prevent Infection  Recent Flowsheet Documentation  Taken 08/10/2021 0200 by Remi Deter, RN  Infection Prevention: rest/sleep promoted  Taken 08/10/2021 0000 by Remi Deter, RN  Infection Prevention: rest/sleep promoted  Taken 08/09/2021 2200 by Remi Deter, RN  Infection Prevention: rest/sleep promoted  Taken 08/09/2021 2000 by Remi Deter, RN  Infection Prevention:   hand hygiene promoted   rest/sleep promoted  Goal: Optimal Comfort and Wellbeing  Outcome: Progressing  Goal: Home and Community Transition Plan Established  Outcome: Progressing

## 2021-08-10 NOTE — Unmapped (Signed)
Patient is A&O. Regular diet/sodium restriction, independent with meals. Continent of bowel and bladder. Contact/droplet precautions maintained. Pain relieved with prn meds. No new signs of skin breakdown. Skin and safety protocols maintained. Bed in lowest position, call bell within reach, wheels locked, husband at bedside. Will continue to monitor.     Problem: Rehabilitation (IRF) Plan of Care  Goal: Plan of Care Review  Outcome: Progressing  Goal: Patient-Specific Goal (Individualized)  Outcome: Progressing  Goal: Absence of New-Onset Illness or Injury  Outcome: Progressing  Intervention: Prevent Fall and Fall Injury  Recent Flowsheet Documentation  Taken 08/09/2021 1600 by Earlie Counts, RN  Safety Interventions: fall reduction program maintained  Taken 08/09/2021 1400 by Earlie Counts, RN  Safety Interventions: fall reduction program maintained  Taken 08/09/2021 1200 by Earlie Counts, RN  Safety Interventions: fall reduction program maintained  Taken 08/09/2021 1000 by Earlie Counts, RN  Safety Interventions: fall reduction program maintained  Taken 08/09/2021 0800 by Earlie Counts, RN  Safety Interventions: fall reduction program maintained  Goal: Optimal Comfort and Wellbeing  Outcome: Progressing  Goal: Home and Community Transition Plan Established  Outcome: Progressing     Problem: Impaired Wound Healing  Goal: Optimal Wound Healing  Outcome: Progressing     Problem: Self-Care Deficit  Goal: Improved Ability to Complete Activities of Daily Living  Outcome: Progressing     Problem: Skin Injury Risk Increased  Goal: Skin Health and Integrity  Outcome: Progressing     Problem: Hypertension Comorbidity  Goal: Blood Pressure in Desired Range  Outcome: Progressing     Problem: Fluid Volume Excess  Goal: Fluid Balance  Outcome: Progressing     Problem: Infection  Goal: Absence of Infection Signs and Symptoms  Outcome: Progressing

## 2021-08-10 NOTE — Unmapped (Signed)
Family Medicine Inpatient Service  Brief Consult Note    ASSESSMENT/ PLAN:   Colby Catanese is a 46 y.o. 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     # CKD I nephrotic range proteinuria I volume overload: Patient has long-standing history of uncontrolled DM, HTN and subsequent CKD w/ albuminuria. 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 Though, recommend kidney biopsy for further evaluation. Nephrology signed off 12/27 but plans to follow outpatient. Unclear, but seems patient has history of CKD w/ proteinuria ~10 years prior as well. PMR requesting consultation for resumption of diuretics. Weight improving with diuresis. Recent Echo w/ normal EF. Most recent BMP with Cr 1.12, normal GFR.   - Continue bumex 1mg  PO BID; appears appropriate UOP however no consistent evening documentation of I's and O's.   - Okay to continue ACEi and SGLT2i   - Strict intake/output  - Start compression stockings  - Daily weights   - Discussed restricting diet, so 2g Na and 2L fluid restricted diet the patient doesn't feel is reasonable especially to do both Na, fluid restriction and consistent carb  - Daily BMP, Mg while diuresing  [ ]  Reschedule outpatient nephrology follow-up appointment (currently scheduled on 08/11/2020)  [ ]  Agree with coordination of outpatient kidney biopsy with VIR prior to discharge    #Poorly controlled IDDM: Improved-  Now near glycemic goal. Recently changes 1/4, thus will CTM.   - Glargine 46 U nightly started 1/4 PM  - Lispro 22 with meals and SSI   - Dietary changes as above  - Will CTM additional 24-48 hours before next recs.    Family medicine will continue to monitor BG and make recommendations accordingly.     Recommendations:  1. Glargine 46 units (already completed 1/4 PM)  2.  Lispro to 22 units AC (completed 1/3 PM)  3. Will CTM blood sugars 24-48 hours given new changes as above  4. Continue Resistant SSI   5. Continue Jardiance as SGLT2i is recommended in diabetic patients with CKD.   6. Continue ACEi for similar indication.   7. Consider carb restricted diet if unable to achieve consistent euglycemia  8. Re-check A1c ~3 months  9. Agree with endocrinology referral at discharge  10. Agree with stopping Metformin given CKD  11. Consider addition of GLP1-agonist inpatient vs outpatient for glycemic control and weight loss (patient would prefer daily vs once weekly injections given difficulties in adherence to once weekly previously)  12. Agree with diabetes education      Please page Family Medicine Admission Pager [phone]) for questions or further consult needs.     HPI:  Nikcole Eischeid is a 46 y.o. 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     MEDICATIONS/ ALLERGIES:  No current facility-administered medications on file prior to encounter.     Current Outpatient Medications on File Prior to Encounter  Medication Sig   ??? ascorbic Acid (VITAMIN C) 500 mg CpER Take 500 mg by mouth.   ??? aspirin (ECOTRIN) 81 MG tablet Take 81 mg by mouth daily.   ??? atorvastatin (LIPITOR) 20 MG tablet Take 20 mg by mouth daily.   ??? BD INSULIN SYRINGE ULTRA-FINE 1 mL 31 gauge x 5/16 (8 mm) Syrg USE 2 (TWO) TIMES DAILY AS DIRECTED   ??? blood sugar diagnostic Strp Frequency:PHARMDIR   Dosage:0.0     Instructions:  Note:Use to test blood sugar in the morning before breakfast and in the evening two hours after dinner, ISD-9 250.00. Dose: 1   ??? blood-glucose meter Misc U QID UTD   ??? cetirizine (ZYRTEC) 10 MG tablet Take 10 mg by mouth daily.   ??? cholecalciferol, vitamin D3, 1,000 unit (25 mcg) tablet Take 1,000 Units by mouth.   ??? clindamycin (CLEOCIN) 300 MG capsule TAKE 1 CAPSULE BY MOUTH TWO TIMES A DAY.   ??? empagliflozin (JARDIANCE) 10 mg tablet Take 1 tablet (10 mg total) by mouth daily.   ??? empty container Misc Use as directed to dispose of Cosentyx pens.   ??? fenofibrate (TRICOR) 48 MG tablet Take 1 tablet (48 mg total) by mouth daily.   ??? finasteride (PROSCAR) 5 mg tablet TAKE 1 TABLET BY MOUTH EVERY DAY   ??? fluconazole (DIFLUCAN) 150 MG tablet TAKE 1 TAB NOW AN THEN TAKE 1 TABLET ONCE WEEKLY AS NEEDED FOR YEAST INFECTION   ??? gabapentin (NEURONTIN) 300 MG capsule Take 1 capsule (300 mg total) by mouth Two (2) times a day.   ??? insulin glargine (LANTUS) 100 unit/mL injection Inject 0.6 mL (60 Units total) under the skin nightly.   ??? insulin lispro (HUMALOG) 100 unit/mL injection Inject 0.1 mL (10 Units total) under the skin Three (3) times a day before meals.   ??? levothyroxine (SYNTHROID, LEVOTHROID) 50 MCG tablet Take by mouth. Taking on Monday Wednesday Friday, Saturday & Sunday and on Tuesday & Thursday   ??? lidocaine (XYLOCAINE) 5 % ointment Apply to painful areas every 3-4 hours as needed   ??? lisinopriL (PRINIVIL,ZESTRIL) 40 MG tablet Take 1 tablet (40 mg total) by mouth daily.   ??? multivitamin capsule Take 1 capsule by mouth daily.   ??? pantoprazole (PROTONIX) 40 MG tablet Take 40 mg by mouth.   ??? pen needle, diabetic 31 gauge x 5/16 Ndle Use 1 needle to skin four times daily as directed   ??? secukinumab (COSENTYX) 150 mg/mL PnIj injection Inject the contents of 2 pens (300 mg total) under the skin every fourteen (14) days as maintenance doses.  START 14 days after last loading dose.     Allergies   Allergen Reactions   ??? [employer] Other (See Comments)   ??? Lamictal [Lamotrigine] Rash   ??? Oxycodone Nausea And Vomiting       PHYSICAL EXAM:  Vitals:    08/10/21 1022   BP: 123/68   Pulse: 108   Resp:    Temp:    SpO2:      Patient showering today- unable to examine.     Sharin Grave, MD

## 2021-08-10 NOTE — Unmapped (Signed)
Physical Medicine and Rehabilitation  Daily Progress Note [facility]    ASSESSMENT:   Summer Russell is a 46 y.o. female with PMHx of HTN, HLD, GERD, uncontrolled type 2 diabetes, polyneuropathy, hypothyroidism, psoriasis complicated by arthritis (on long-standing infliximab infusions), fibromyalgia, and sleep apnea??admitted to [facility] 12/20 for severe hyperglycemia and lactic acidosis and nephrotic range proteinuria along with worsening left sided weakness, urinary retention and saddle anesthesia.??  ??  Impairment Group Code: (Neurologic Conditions) 03.3 Polyneuropathy  PLAN:     REHAB:   - PT and OT to maximize functional status with mobility and ADLs as well as prevention of joint contracture   - RT for community re-integration, relaxation, and Support Group  - Discussed at next interdisciplinary team conference    ??  Lower extremity weakness/Sensory deficits: MRI brain negative (12/24). Patient unable to tolerate close spaces so spine MRI not obtainable. Patient may have lumbosacral plexopathy (such as diabetic amyotrophy vs polyneuropathy) in setting of known neuropathy, so EMG/NCS could be considered, however due to body habitus, may be more challenging to perform. Deferred EMG inpatient as symptoms improved with better glucose control.  [ ]  Plan for outpatient referral to neurology to consider outpatient EMG/NCS  Urinary retention:  - Passed TOV  Prophylaxis:  - Lovenox 40 mg BID (weight based)  Pain:  - Tylenol 1000 mg TID prn  ??  Hyperglycemia - Uncontrolled T2DM with insulin dependence: BG elevated to >400 during acute hospitalization (no AG or acidemia to suggest DKA).   - Inpt consult to FM for insulin titration/comangement, appreciate recs  - Inpt consult to diabetes education  - Continue Jardiance 10 mg daily??  -??Continue glargine 46U BID (increased 1/4)  - Continue lispro 22U TIDAC (increased 1/3)  - resistant SSI  - Holding metformin given CKD  [ ]  plan for outpatient referral to [facility] endocrinology    RSV: Pt endorsed cough, intermittently productive, sore throat, generalized malaise, and intermittent lightheadedness which developed overnight. Afebrile, O2 sats wnl and other vitals stable. Per pt's spouse, he had similar symptoms during pt's acute admission which have since resolved. Rapid respiratory PCR panel from 1/3 positive for RSV. Symptoms managed overnight with prns appropriately, continues to endorse nasal/sinus congestion and cough worse with supine, improved when sitting.    - Droplet and isolation precautions started, continue until pt asymptomatic and at baseline respiratory status   - Symptomatic care prn   ??  Nephrotic range proteinuria - B/l LE Edema: Nephrology consulted during acute hospitalization due to AKI, proteinuria and elevated lactate. Suspect due to??diabetic nephropathy, but also sent additional labs (infectious studies, autoimmune work-up) for further work-up. Mild increase to pt's sCr today 1.13 from 1.03, within her baseline, will CTM given recent restart of BID Bumex dosing.   - Continue lisinopril 40 mg daily and empagliflozin 10 mg daily as tolerated for proteinuria  - HOLD home OCP (ethinyl estradiol/drospirenone), given hypercoagulability associated with nephrotic syndrome per pharmacy recommendation.   - Continue bumex 1mg  BID (increased 1/2)  - Daily standing weights  [ ]  Reschedule outpatient nephrology follow-up appointment (currently scheduled on 08/11/2020)  [ ]  plan to coordinate outpatient kidney biopsy with VIR prior to discharge  ??  Hydradenitis suppurativa - Psoriasis: Managed by dermatology outpatient; on IV Renflexis, finasteride, and clindamycin.??S/p Renflexis infusion 12/19  - Continue home clindamycin 300 mg BID and finasteride 5 mg daily    Chronic Problems:   Vertigo/nausea: zyrtec 10 mg daily, compazine (first line), Zofran (second line), meclizine prn,  Hypothyroidism??- Continue home Synthroid 50 mcg every day + 25 mcg T/Th  HTN - Lisinopril and Bumex as above   HLD - Continue home Lipitor 20 mg daily, fenofibrate 48 mg daily, ASA 81 mg daily  Sleep Apnea - Does not tolerate CPAP at bedtime  GERD??- Continue home protonix 40 mg daily  Rhinitis - Cough - lozenges, robitussin, flonase prn  ??  DISPO: Admitted to Rehab floor, patient will be discussed at next interdisciplinary team conference   EDD: 1/10  Follow up: PCP, neurology referral, nephrology, endocrinology referral     SUBJECTIVE:     Interval Events: NAEON. VSS. Reporting that her upper respiratory congestion seems worse today, though her chest congestion is improving. Says now her congestion seems focused in her sinuses and throat. Discussed scheduling her guaifenesin and using saline nasal spray to help with clearing her nares. Was amenable.   Daily weight improved today to 364lbs. Will continue to monitor as volume status appears to be slowly improving.   OBJECTIVE:     Vital signs (last 24 hours):  Temp:  [36.4 ??C (97.5 ??F)-36.7 ??C (98.1 ??F)] 36.4 ??C (97.5 ??F)  Heart Rate:  [107-108] 108  Resp:  [16-19] 19  BP: (107-133)/(63-68) 123/68  MAP (mmHg):  [77-86] 84  SpO2:  [97 %-98 %] 98 %    Intake/Output (last 3 shifts):  I/O last 3 completed shifts:  In: -   Out: 700 [Urine:700]    Physical Exam:  GEN: NAD, sitting up in chair  EYES: sclera anicteric, conjunctiva clear   HENT: NCAT  NECK: trachea midline  CV: warm and well perfused extremities   RESP: NWOB on RA  SKIN: no visible rashes or skin breakdown; b/l LE venous stasis changes  MSK: no notable contractures; b/l LE non-pitting edema to knees, slightly improved  NEURO: Awake and alert, answers questions appropriately    Medications:  Scheduled   ??? aspirin chewable tablet 81 mg Daily   ??? atorvastatin (LIPITOR) tablet 20 mg Daily   ??? bumetanide (BUMEX) tablet 1 mg BID   ??? cetirizine (ZyrTEC) tablet 10 mg Daily   ??? cholecalciferol (vitamin D3 25 mcg (1,000 units)) tablet 25 mcg Daily   ??? clindamycin (CLEOCIN) capsule 300 mg BID   ??? diclofenac sodium (VOLTAREN) 1 % gel 2 g QID   ??? empagliflozin (JARDIANCE) tablet 10 mg Daily   ??? enoxaparin (LOVENOX) syringe 40 mg Q12H   ??? fenofibrate (TRICOR) tablet 48 mg Daily   ??? finasteride (PROSCAR) tablet 5 mg Daily   ??? guaiFENesin (ROBITUSSIN) oral syrup Q4H   ??? insulin glargine (LANTUS) injection 46 Units BID   ??? insulin lispro (HumaLOG) injection 0-20 Units ACHS   ??? insulin lispro (HumaLOG) injection 22 Units TID AC   ??? levothyroxine (SYNTHROID) tablet 50 mcg Once per day on Sun Mon Wed Fri Sat   ??? levothyroxine (SYNTHROID) tablet 75 mcg Once per day on Tue Thu   ??? lisinopriL (PRINIVIL,ZESTRIL) tablet 40 mg Daily   ??? nystatin (MYCOSTATIN) powder 1 application BID   ??? pantoprazole (PROTONIX) EC tablet 40 mg Daily     PRN acetaminophen, 1,000 mg, Q8H PRN  bismuth subsalicylate, 30 mL, Q6H PRN  calcium carbonate, 200 mg of elem calcium, TID PRN  dextrose, 12.5 g, Q15 Min PRN  fluticasone propionate, 1 spray, Daily PRN  glucagon, 1 mg, Once PRN  glucose, 16 g, Q10 Min PRN  hydrocortisone, 25 mg, Daily PRN  hydrOXYzine, 25 mg, Q6H PRN  meclizine, 25  mg, TID PRN  cough/sore throat lozenge, 1 lozenge, Q2H PRN  prochlorperazine, 5 mg, Q6H PRN   And  ondansetron, 4 mg, BID PRN  polyethylene glycol, 17 g, Daily PRN  simethicone, 80 mg, TID PRN  sodium chloride, 1 spray, Q6H PRN  traZODone, 50 mg, Nightly PRN      Labs/Studies: Reviewed.    Radiology Results: Reviewed.

## 2021-08-11 LAB — CBC
HEMATOCRIT: 31.2 % — ABNORMAL LOW (ref 34.0–44.0)
HEMOGLOBIN: 10.7 g/dL — ABNORMAL LOW (ref 11.3–14.9)
MEAN CORPUSCULAR HEMOGLOBIN CONC: 34.4 g/dL (ref 32.0–36.0)
MEAN CORPUSCULAR HEMOGLOBIN: 31.3 pg (ref 25.9–32.4)
MEAN CORPUSCULAR VOLUME: 90.9 fL (ref 77.6–95.7)
MEAN PLATELET VOLUME: 8.6 fL (ref 6.8–10.7)
PLATELET COUNT: 258 10*9/L (ref 150–450)
RED BLOOD CELL COUNT: 3.43 10*12/L — ABNORMAL LOW (ref 3.95–5.13)
RED CELL DISTRIBUTION WIDTH: 13.9 % (ref 12.2–15.2)
WBC ADJUSTED: 3.6 10*9/L (ref 3.6–11.2)

## 2021-08-11 LAB — ALBUMIN: ALBUMIN: 2.7 g/dL — ABNORMAL LOW (ref 3.4–5.0)

## 2021-08-11 LAB — BASIC METABOLIC PANEL
ANION GAP: 9 mmol/L (ref 5–14)
BLOOD UREA NITROGEN: 27 mg/dL — ABNORMAL HIGH (ref 9–23)
BUN / CREAT RATIO: 23
CALCIUM: 9.2 mg/dL (ref 8.7–10.4)
CHLORIDE: 104 mmol/L (ref 98–107)
CO2: 25 mmol/L (ref 20.0–31.0)
CREATININE: 1.19 mg/dL — ABNORMAL HIGH
EGFR CKD-EPI (2021) FEMALE: 58 mL/min/{1.73_m2} — ABNORMAL LOW (ref >=60)
GLUCOSE RANDOM: 281 mg/dL — ABNORMAL HIGH (ref 70–179)
POTASSIUM: 4.3 mmol/L (ref 3.4–4.8)
SODIUM: 138 mmol/L (ref 135–145)

## 2021-08-11 LAB — PROTEIN, TOTAL: PROTEIN TOTAL: 5.6 g/dL — ABNORMAL LOW (ref 5.7–8.2)

## 2021-08-11 LAB — MAGNESIUM: MAGNESIUM: 1.9 mg/dL (ref 1.6–2.6)

## 2021-08-11 MED ADMIN — cetirizine (ZyrTEC) tablet 10 mg: 10 mg | ORAL | @ 14:00:00

## 2021-08-11 MED ADMIN — bumetanide (BUMEX) tablet 1 mg: 1 mg | ORAL | @ 11:00:00 | Stop: 2021-08-11

## 2021-08-11 MED ADMIN — guaiFENesin (ROBITUSSIN) oral syrup: 100 mg | ORAL | @ 11:00:00

## 2021-08-11 MED ADMIN — menthol (COUGH DROPS) lozenge 1 lozenge: 1 | ORAL | @ 17:00:00

## 2021-08-11 MED ADMIN — guaiFENesin (ROBITUSSIN) oral syrup: 100 mg | ORAL | @ 19:00:00

## 2021-08-11 MED ADMIN — insulin glargine (LANTUS) injection 46 Units: 46 [IU] | SUBCUTANEOUS | @ 03:00:00

## 2021-08-11 MED ADMIN — insulin lispro (HumaLOG) injection 0-20 Units: 0-20 [IU] | SUBCUTANEOUS | @ 03:00:00

## 2021-08-11 MED ADMIN — menthol (COUGH DROPS) lozenge 1 lozenge: 1 | ORAL | @ 22:00:00

## 2021-08-11 MED ADMIN — fenofibrate (TRICOR) tablet 48 mg: 48 mg | ORAL | @ 14:00:00

## 2021-08-11 MED ADMIN — insulin lispro (HumaLOG) injection 0-20 Units: 0-20 [IU] | SUBCUTANEOUS | @ 18:00:00

## 2021-08-11 MED ADMIN — aspirin chewable tablet 81 mg: 81 mg | ORAL | @ 14:00:00

## 2021-08-11 MED ADMIN — menthol (COUGH DROPS) lozenge 1 lozenge: 1 | ORAL | @ 11:00:00

## 2021-08-11 MED ADMIN — nystatin (MYCOSTATIN) powder 1 application: 1 | TOPICAL | @ 14:00:00

## 2021-08-11 MED ADMIN — enoxaparin (LOVENOX) syringe 40 mg: 40 mg | SUBCUTANEOUS | @ 14:00:00

## 2021-08-11 MED ADMIN — insulin lispro (HumaLOG) injection 0-20 Units: 0-20 [IU] | SUBCUTANEOUS | @ 12:00:00

## 2021-08-11 MED ADMIN — insulin lispro (HumaLOG) injection 0-20 Units: 0-20 [IU] | SUBCUTANEOUS | @ 22:00:00

## 2021-08-11 MED ADMIN — menthol (COUGH DROPS) lozenge 1 lozenge: 1 | ORAL | @ 19:00:00

## 2021-08-11 MED ADMIN — insulin lispro (HumaLOG) injection 25 Units: 25 [IU] | SUBCUTANEOUS

## 2021-08-11 MED ADMIN — insulin lispro (HumaLOG) injection 22 Units: 22 [IU] | SUBCUTANEOUS | @ 14:00:00 | Stop: 2021-08-11

## 2021-08-11 MED ADMIN — prochlorperazine (COMPAZINE) tablet 5 mg: 5 mg | ORAL | @ 19:00:00

## 2021-08-11 MED ADMIN — empagliflozin (JARDIANCE) tablet 10 mg: 10 mg | ORAL | @ 14:00:00

## 2021-08-11 MED ADMIN — clindamycin (CLEOCIN) capsule 300 mg: 300 mg | ORAL | @ 03:00:00 | Stop: 2021-08-22

## 2021-08-11 MED ADMIN — acetaminophen (TYLENOL) tablet 1,000 mg: 1000 mg | ORAL | @ 03:00:00

## 2021-08-11 MED ADMIN — finasteride (PROSCAR) tablet 5 mg: 5 mg | ORAL | @ 14:00:00

## 2021-08-11 MED ADMIN — guaiFENesin (ROBITUSSIN) oral syrup: 100 mg | ORAL | @ 14:00:00

## 2021-08-11 MED ADMIN — cholecalciferol (vitamin D3 25 mcg (1,000 units)) tablet 25 mcg: 25 ug | ORAL | @ 14:00:00

## 2021-08-11 MED ADMIN — insulin glargine (LANTUS) injection 46 Units: 46 [IU] | SUBCUTANEOUS | @ 14:00:00 | Stop: 2021-08-11

## 2021-08-11 MED ADMIN — atorvastatin (LIPITOR) tablet 20 mg: 20 mg | ORAL | @ 14:00:00

## 2021-08-11 MED ADMIN — guaiFENesin (ROBITUSSIN) oral syrup: 100 mg | ORAL | @ 03:00:00

## 2021-08-11 MED ADMIN — pantoprazole (PROTONIX) EC tablet 40 mg: 40 mg | ORAL | @ 14:00:00

## 2021-08-11 MED ADMIN — lisinopriL (PRINIVIL,ZESTRIL) tablet 40 mg: 40 mg | ORAL | @ 14:00:00

## 2021-08-11 MED ADMIN — enoxaparin (LOVENOX) syringe 40 mg: 40 mg | SUBCUTANEOUS | @ 03:00:00

## 2021-08-11 MED ADMIN — hydrOXYzine (ATARAX) tablet 25 mg: 25 mg | ORAL | @ 07:00:00

## 2021-08-11 MED ADMIN — nystatin (MYCOSTATIN) powder 1 application: 1 | TOPICAL | @ 03:00:00

## 2021-08-11 MED ADMIN — insulin lispro (HumaLOG) injection 22 Units: 22 [IU] | SUBCUTANEOUS | @ 18:00:00 | Stop: 2021-08-11

## 2021-08-11 MED ADMIN — guaiFENesin (ROBITUSSIN) oral syrup: 100 mg | ORAL

## 2021-08-11 MED ADMIN — clindamycin (CLEOCIN) capsule 300 mg: 300 mg | ORAL | @ 14:00:00 | Stop: 2021-08-22

## 2021-08-11 MED ADMIN — levothyroxine (SYNTHROID) tablet 50 mcg: 50 ug | ORAL | @ 11:00:00

## 2021-08-11 NOTE — Unmapped (Signed)
Patient is A&O. Regular diet/sodium restriction, independent with meals. Continent of bowel and bladder. Contact/droplet precautions maintained. Pain relieved with prn meds. No new signs of skin breakdown. Skin and safety protocols maintained. Bed in lowest position, call bell within reach, wheels locked, husband at bedside. Will continue to monitor.     Problem: Rehabilitation (IRF) Plan of Care  Goal: Plan of Care Review  Outcome: Progressing  Goal: Patient-Specific Goal (Individualized)  Outcome: Progressing  Goal: Absence of New-Onset Illness or Injury  Outcome: Progressing  Intervention: Prevent Fall and Fall Injury  Recent Flowsheet Documentation  Taken 08/10/2021 1600 by Earlie Counts, RN  Safety Interventions: fall reduction program maintained  Taken 08/10/2021 1400 by Earlie Counts, RN  Safety Interventions: fall reduction program maintained  Taken 08/10/2021 1200 by Earlie Counts, RN  Safety Interventions: fall reduction program maintained  Taken 08/10/2021 1000 by Earlie Counts, RN  Safety Interventions: fall reduction program maintained  Taken 08/10/2021 0800 by Earlie Counts, RN  Safety Interventions: fall reduction program maintained  Goal: Optimal Comfort and Wellbeing  Outcome: Progressing  Goal: Home and Community Transition Plan Established  Outcome: Progressing     Problem: Impaired Wound Healing  Goal: Optimal Wound Healing  Outcome: Progressing     Problem: Self-Care Deficit  Goal: Improved Ability to Complete Activities of Daily Living  Outcome: Progressing     Problem: Skin Injury Risk Increased  Goal: Skin Health and Integrity  Outcome: Progressing     Problem: Hypertension Comorbidity  Goal: Blood Pressure in Desired Range  Outcome: Progressing     Problem: Fluid Volume Excess  Goal: Fluid Balance  Outcome: Progressing     Problem: Infection  Goal: Absence of Infection Signs and Symptoms  Outcome: Progressing

## 2021-08-11 NOTE — Unmapped (Signed)
Physical Medicine and Rehabilitation  Daily Progress Note [facility]    ASSESSMENT:   Summer Russell is a 46 y.o. female with PMHx of HTN, HLD, GERD, uncontrolled type 2 diabetes, polyneuropathy, hypothyroidism, psoriasis complicated by arthritis (on long-standing infliximab infusions), fibromyalgia, and sleep apnea??admitted to [facility] 12/20 for severe hyperglycemia and lactic acidosis and nephrotic range proteinuria along with worsening left sided weakness, urinary retention and saddle anesthesia.??  ??  Impairment Group Code: (Neurologic Conditions) 03.3 Polyneuropathy  PLAN:     REHAB:   - PT and OT to maximize functional status with mobility and ADLs as well as prevention of joint contracture   - RT for community re-integration, relaxation, and Support Group  - Discussed at next interdisciplinary team conference    ??  Lower extremity weakness/Sensory deficits: MRI brain negative (12/24). Patient unable to tolerate close spaces so spine MRI not obtainable. Patient may have lumbosacral plexopathy (such as diabetic amyotrophy vs polyneuropathy) in setting of known neuropathy, so EMG/NCS could be considered, however due to body habitus, may be more challenging to perform. Deferred EMG inpatient as symptoms improved with better glucose control. Initially with concern for urinary retention but passed TOV and continent of bladder since.   [ ]  Plan for outpatient referral to neurology to consider outpatient EMG/NCS  DVT Prophylaxis:  - Lovenox 40 mg BID (weight based)  Pain:  - Tylenol 1000 mg TID prn  ??  Hyperglycemia - Uncontrolled T2DM with insulin dependence: BG elevated to >400 during acute hospitalization (no AG or acidemia to suggest DKA). Diabetes education and FM consulted for insulin titration/comangement, appreciate recs. Continues to be hyperglycemic with fasting and throughout the day.  - Continue Jardiance 10 mg daily??  -??Continue Lantus 46U BID (increased 1/4)  - Continue Humalog 22U TIDAC (increased 1/3)  - resistant SSI  - Holding metformin given CKD  [ ]  plan for outpatient referral to [facility] endocrinology    RSV: Pt endorsed cough, intermittently productive, sore throat, generalized malaise, and intermittent lightheadedness. Per pt's spouse, he had similar symptoms during pt's acute admission, since resolved. Rapid respiratory PCR panel from 1/3 positive for RSV. Symptoms managed with prns appropriately. Continues to endorse nasal/sinus congestion and generalized fatigue. Afebrile, O2 sats wnl and other vitals stable.   - Continue Droplet and isolation precautions; continue until pt asymptomatic and at baseline respiratory status   - Continue scheduled guaifenesin 100mg  QID (start 1/5)   - Symptomatic care prn   ??  CKD - Nephrotic range proteinuria - B/l LE Edema/Hypervolemia: Nephrology consulted during acute hospitalization due to AKI, proteinuria and elevated lactate. 12/20 Echo w/ normal EF. 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 Recommended for outpt kidney biopsy to further evaluate. Daily standing weights have showed improvement in volume status with diuresis though increased 1/6 compared to previous. Pt subjectively reports decreased urination overnight compared to days previous. Has been hypoalbuminemic and low total prtn suggesting b/l LE edema has a component of 3rd spacing 2/2 low oncotic pressures. Discussed with FM who agree with decreasing diuretic to daily given mild sCr bump with labs 1/6.   - Continue empagliflozin 10mg  daily as tolerated for proteinuria  - HOLD home OCP (ethinyl estradiol/drospirenone), given hypercoagulability associated with nephrotic syndrome per pharmacy recommendation.   - Decrease Bumex 1mg  to daily (decreased 1/6)  - Daily standing weights  - Strict I&O  [x]  Rescheduled outpatient nephrology follow-up appointment   [ ]  plan to  coordinate outpatient kidney biopsy with VIR prior to discharge  ??  Hydradenitis suppurativa - Psoriasis: Managed by dermatology outpatient; on IV Renflexis, finasteride, and clindamycin.??S/p Renflexis infusion 12/19  - Continue home clindamycin 300mg  BID   - Continue Finasteride 5mg  daily    Chronic Problems:   Vertigo/nausea: zyrtec 10 mg daily, compazine prn (first line), Zofran prn (second line), meclizine TID prn   Hypothyroidism: Continue home Synthroid 50 mcg every day + 25 mcg T/Th  HTN: Lisinopril and Bumex as above   HLD: Continue home Lipitor 20 mg daily, fenofibrate 48 mg daily, ASA 81 mg daily  Sleep Apnea: Does not tolerate CPAP at bedtime  GERD: Continue home protonix 40 mg daily  Rhinitis - Cough:  lozenges, robitussin, flonase prn  ??  DISPO: Admitted to Rehab floor  EDD: 1/10  Follow up: PCP, neurology referral, nephrology, endocrinology referral     SUBJECTIVE:     Interval Events: NAEON. VSS. BM charted overnight. Subjectively, pt continues to experience nasal/sinus congestion with intermittent productive cough and generalized fatigue. Hopeful this will continue to improve and looking forward to resting over the weekend. Daily weight mildly increased overnight with pt reporting decreased urine output despite continued Bumex BID dosing. Given mild increase to sCr with morning labs and decreased diuresis, LE edema likely represents third spacing and so will decrease Bumex to daily in discussion with FM. Will CTM volume status. Discussed during interdisciplinary conference with patient progressing through AIR course with functional improvement despite being limited to intra-room therapies 2/2 RSV.    OBJECTIVE:     Vital signs (last 24 hours):  Temp:  [36.8 ??C (98.2 ??F)] 36.8 ??C (98.2 ??F)  Heart Rate:  [108-109] 109  Resp:  [20] 20  BP: (123-147)/(68) 147/68  MAP (mmHg):  [84] 84  SpO2:  [98 %] 98 %    Intake/Output (last 3 shifts):  No intake/output data recorded.    Physical Exam:  GEN: NAD, sitting up in chair  EYES: sclera anicteric, conjunctiva clear   HENT: NCAT  NECK: trachea midline  CV: warm and well perfused extremities   RESP: NWOB on RA, without accessory muscle use or new O2 requirements  SKIN: no visible rashes or skin breakdown; b/l LE venous stasis changes  MSK: no notable contractures; b/l LE non-pitting edema to knees, slightly improved  NEURO: Awake and alert, answers questions appropriately    Medications:  Scheduled   ??? aspirin chewable tablet 81 mg Daily   ??? atorvastatin (LIPITOR) tablet 20 mg Daily   ??? bumetanide (BUMEX) tablet 1 mg BID   ??? cetirizine (ZyrTEC) tablet 10 mg Daily   ??? cholecalciferol (vitamin D3 25 mcg (1,000 units)) tablet 25 mcg Daily   ??? clindamycin (CLEOCIN) capsule 300 mg BID   ??? diclofenac sodium (VOLTAREN) 1 % gel 2 g QID   ??? empagliflozin (JARDIANCE) tablet 10 mg Daily   ??? enoxaparin (LOVENOX) syringe 40 mg Q12H   ??? fenofibrate (TRICOR) tablet 48 mg Daily   ??? finasteride (PROSCAR) tablet 5 mg Daily   ??? guaiFENesin (ROBITUSSIN) oral syrup Q4H   ??? insulin glargine (LANTUS) injection 46 Units BID   ??? insulin lispro (HumaLOG) injection 0-20 Units ACHS   ??? insulin lispro (HumaLOG) injection 22 Units TID AC   ??? levothyroxine (SYNTHROID) tablet 50 mcg Once per day on Sun Mon Wed Fri Sat   ??? levothyroxine (SYNTHROID) tablet 75 mcg Once per day on Tue Thu   ??? lisinopriL (PRINIVIL,ZESTRIL) tablet 40 mg Daily   ???  nystatin (MYCOSTATIN) powder 1 application BID   ??? pantoprazole (PROTONIX) EC tablet 40 mg Daily     PRN acetaminophen, 1,000 mg, Q8H PRN  bismuth subsalicylate, 30 mL, Q6H PRN  calcium carbonate, 200 mg of elem calcium, TID PRN  dextrose, 12.5 g, Q15 Min PRN  fluticasone propionate, 1 spray, Daily PRN  glucagon, 1 mg, Once PRN  glucose, 16 g, Q10 Min PRN  hydrocortisone, 25 mg, Daily PRN  hydrOXYzine, 25 mg, Q6H PRN  meclizine, 25 mg, TID PRN  cough/sore throat lozenge, 1 lozenge, Q2H PRN  prochlorperazine, 5 mg, Q6H PRN   And  ondansetron, 4 mg, BID PRN  polyethylene glycol, 17 g, Daily PRN  simethicone, 80 mg, TID PRN  sodium chloride, 1 spray, Q6H PRN  traZODone, 50 mg, Nightly PRN      Labs/Studies: Reviewed.    Radiology Results: Reviewed.

## 2021-08-11 NOTE — Unmapped (Signed)
Discharge Services and Resources:    Home Health Services:  Home Health Skilled Nursing, Physical Therapy, Occupational Therapy has been set up with  [facility] [phone].  Start of Care for Home Health 08/16/21. If you have not been contacted by Home Health by 08/17/21, please call the above number regarding services.        You were set up with a Wheelchair transport for discharge to home with [facility].

## 2021-08-11 NOTE — Unmapped (Signed)
Adult Nutrition Assessment Note    Visit Type: MD Consult  Reason for Visit: Assessment (Nutrition)      HPI & PMH:   46 y.o. female with PMHx of HTN, HLD, GERD, uncontrolled type 2 diabetes, polyneuropathy, hypothyroidism, ??psoriasis complicated by arthritis (on long-standing infliximab infusions), fibromyalgia, and sleep apnea??admitted to [facility] 12/20 for severe hyperglycemia and lactic acidosis and nephrotic range proteinuria along with worsening left sided weakness, urinary retention and saddle anesthesia    Anthropometric Data:  Height: 167.6 cm (5' 5.98)   Admission weight: (!) 163.9 kg (361 lb 5.3 oz)  Last recorded weight: (!) 166.5 kg (367 lb 1.1 oz)  IBW: 58.98 kg  Percent IBW: 277.89 %  BMI: Body mass index is 59.27 kg/m??.   Usual Body Weight: Unable to obtain at this time     Weight history prior to admission: Unable to obtain at this time    Wt Readings from Last 10 Encounters:   08/11/21 (!) 166.5 kg (367 lb 1.1 oz)   08/01/21 (!) 176.6 kg (389 lb 5.3 oz)   07/29/21 (!) 179.2 kg (395 lb)   07/28/21 (!) 179.2 kg (395 lb)   07/24/21 (!) 170.5 kg (375 lb 12.8 oz)   06/26/21 (!) 166.3 kg (366 lb 11.2 oz)   05/23/21 (!) 179.2 kg (395 lb)   04/25/21 (!) 178.9 kg (394 lb 6.5 oz)   03/28/21 (!) 177.8 kg (392 lb)   02/28/21 (!) 178.1 kg (392 lb 11.2 oz)        Weight changes this admission:   Last 5 Recorded Weights    08/03/21 0600 08/07/21 0500 08/08/21 0900 08/10/21 0600   Weight: (!) 163.9 kg (361 lb 5.3 oz) (!) 163.8 kg (361 lb 1.8 oz) (!) 171.4 kg (377 lb 13.9 oz) (!) 165.3 kg (364 lb 6.7 oz)    08/11/21 0600   Weight: (!) 166.5 kg (367 lb 1.1 oz)        Nutrition Focused Physical Exam:  Nutrition Focused Physical Exam:                        Nutrition Evaluation  Overall Impressions: Nutrition-Focused Physical Exam not indicated due to lack of malnutrition risk factors. (08/11/21 1459)  Nutrition Designation: Obese class III  (BMI > 39.99 kg/m2) (08/11/21 1459)      NUTRITIONALLY RELEVANT DATA Medications:   Nutritionally pertinent medications reviewed and evaluated for potential food and/or medication interactions. Vitamin D3, Humalog, Protonix    Labs:   Nutritionally pertinent labs reviewed.    Total protein 5.6 g/dl  Albumin 2.7 g/dl    Nutrition History:   August 11, 2021: Prior to admission: Patient unable to provide at this time and no family present.     Allergies, Intolerances, Sensitivities, and/or Cultural/Religious Dietary Restrictions: none identified per chart review at this time     Current Nutrition:  Oral intake        Nutrition Orders   (From admission, onward)             Start     Ordered    08/03/21 0155  Nutrition Therapy Regular/House; Sodium Restricted (2 gm Na+)  Effective now        Question Answer Comment   Nutrition Therapy: Regular/House    Electrolytes Restriction: Sodium Restricted (2 gm Na+)        08/03/21 0155  Nutritional Needs:   Healthy balance of carbohydrate, protein, and fat.       Malnutrition assessment not yet completed at this time due lack of nutrition history.    GOALS and EVALUATION     ??? Patient to meet 70% or greater of nutritional needs via combination of meals, snacks, and/or oral supplements within admit.  - New    Motivation, Barriers, and Compliance:  Evaluation of motivation, barriers, and compliance completed. No concerns identified at this time.     NUTRITION ASSESSMENT     ??? Current nutrition therapy is appropriate and progressing toward meeting meeting nutritional needs at this time.    ??? Low total protein and albumin; patient with AKI with proteinuria per MD note. MD requesting protein supplementation - Ensure High Protein or Ensure Max are good low calorie, high protein options for patient      Discharge Planning:   Monitor for potential discharge needs with multi-disciplinary team.       NUTRITION INTERVENTIONS and RECOMMENDATION     1. Ensure High Protein TID  2. Continue regular diet with 2 gram sodium restriction  3. Document % meals eaten  4. Weigh weekly    Follow-Up Parameters:   1-2 times per week (and more frequent as indicated)    Alesia Morin, RD, LDN

## 2021-08-11 NOTE — Unmapped (Signed)
A & O x 4,c/o pain on LE , relieved with prn tylenol,c/o difficulty in breathing , sat maintaining well , propped up position with pillow,prn atarax given for anxiety/ lack of sleep , continent B & B,LBM on 1/5, droplet & contact precautions maintained,family at bedside, & call bell within reach ,continue to monitor.  Problem: Rehabilitation (IRF) Plan of Care  Goal: Plan of Care Review  Outcome: Progressing  Goal: Patient-Specific Goal (Individualized)  Outcome: Progressing  Goal: Absence of New-Onset Illness or Injury  Outcome: Progressing  Intervention: Prevent Fall and Fall Injury  Recent Flowsheet Documentation  Taken 08/10/2021 2200 by Remi Deter, RN  Safety Interventions: fall reduction program maintained  Taken 08/10/2021 2000 by Remi Deter, RN  Safety Interventions: fall reduction program maintained  Intervention: Prevent Infection  Recent Flowsheet Documentation  Taken 08/10/2021 2200 by Remi Deter, RN  Infection Prevention: rest/sleep promoted  Taken 08/10/2021 2000 by Remi Deter, RN  Infection Prevention: rest/sleep promoted  Intervention: Prevent VTE (Venous Thromboembolism)  Recent Flowsheet Documentation  Taken 08/10/2021 2200 by Remi Deter, RN  VTE Prevention/Management: anticoagulant therapy  Goal: Optimal Comfort and Wellbeing  Outcome: Progressing  Goal: Home and Community Transition Plan Established  Outcome: Progressing

## 2021-08-12 LAB — BASIC METABOLIC PANEL
ANION GAP: 8 mmol/L (ref 5–14)
BLOOD UREA NITROGEN: 32 mg/dL — ABNORMAL HIGH (ref 9–23)
BUN / CREAT RATIO: 24
CALCIUM: 9.3 mg/dL (ref 8.7–10.4)
CHLORIDE: 106 mmol/L (ref 98–107)
CO2: 25.5 mmol/L (ref 20.0–31.0)
CREATININE: 1.33 mg/dL — ABNORMAL HIGH
EGFR CKD-EPI (2021) FEMALE: 51 mL/min/{1.73_m2} — ABNORMAL LOW (ref >=60)
GLUCOSE RANDOM: 231 mg/dL — ABNORMAL HIGH (ref 70–179)
POTASSIUM: 4.3 mmol/L (ref 3.4–4.8)
SODIUM: 139 mmol/L (ref 135–145)

## 2021-08-12 LAB — MAGNESIUM: MAGNESIUM: 1.8 mg/dL (ref 1.6–2.6)

## 2021-08-12 MED ADMIN — clindamycin (CLEOCIN) capsule 300 mg: 300 mg | ORAL | @ 13:00:00 | Stop: 2021-08-22

## 2021-08-12 MED ADMIN — menthol (COUGH DROPS) lozenge 1 lozenge: 1 | ORAL | @ 17:00:00

## 2021-08-12 MED ADMIN — atorvastatin (LIPITOR) tablet 20 mg: 20 mg | ORAL | @ 13:00:00

## 2021-08-12 MED ADMIN — menthol (COUGH DROPS) lozenge 1 lozenge: 1 | ORAL | @ 11:00:00

## 2021-08-12 MED ADMIN — fenofibrate (TRICOR) tablet 48 mg: 48 mg | ORAL | @ 13:00:00

## 2021-08-12 MED ADMIN — oxymetazoline (AFRIN) 0.05 % nasal spray 3 spray: 3 | NASAL | @ 17:00:00 | Stop: 2021-08-15

## 2021-08-12 MED ADMIN — menthol (COUGH DROPS) lozenge 1 lozenge: 1 | ORAL | @ 13:00:00

## 2021-08-12 MED ADMIN — insulin lispro (HumaLOG) injection 0-20 Units: 0-20 [IU] | SUBCUTANEOUS | @ 02:00:00

## 2021-08-12 MED ADMIN — insulin glargine (LANTUS) injection 49 Units: 49 [IU] | SUBCUTANEOUS | @ 13:00:00 | Stop: 2021-08-12

## 2021-08-12 MED ADMIN — insulin lispro (HumaLOG) injection 25 Units: 25 [IU] | SUBCUTANEOUS | @ 13:00:00

## 2021-08-12 MED ADMIN — aspirin chewable tablet 81 mg: 81 mg | ORAL | @ 13:00:00

## 2021-08-12 MED ADMIN — insulin lispro (HumaLOG) injection 25 Units: 25 [IU] | SUBCUTANEOUS | @ 18:00:00

## 2021-08-12 MED ADMIN — guaiFENesin (ROBITUSSIN) oral syrup: 100 mg | ORAL | @ 13:00:00

## 2021-08-12 MED ADMIN — insulin glargine (LANTUS) injection 49 Units: 49 [IU] | SUBCUTANEOUS | @ 02:00:00

## 2021-08-12 MED ADMIN — insulin lispro (HumaLOG) injection 25 Units: 25 [IU] | SUBCUTANEOUS | @ 23:00:00

## 2021-08-12 MED ADMIN — guaiFENesin (ROBITUSSIN) oral syrup: 100 mg | ORAL | @ 03:00:00

## 2021-08-12 MED ADMIN — insulin lispro (HumaLOG) injection 0-20 Units: 0-20 [IU] | SUBCUTANEOUS | @ 12:00:00

## 2021-08-12 MED ADMIN — enoxaparin (LOVENOX) syringe 40 mg: 40 mg | SUBCUTANEOUS | @ 13:00:00

## 2021-08-12 MED ADMIN — insulin lispro (HumaLOG) injection 0-20 Units: 0-20 [IU] | SUBCUTANEOUS | @ 17:00:00

## 2021-08-12 MED ADMIN — insulin lispro (HumaLOG) injection 0-20 Units: 0-20 [IU] | SUBCUTANEOUS | @ 22:00:00

## 2021-08-12 MED ADMIN — finasteride (PROSCAR) tablet 5 mg: 5 mg | ORAL | @ 13:00:00

## 2021-08-12 MED ADMIN — levothyroxine (SYNTHROID) tablet 50 mcg: 50 ug | ORAL | @ 11:00:00

## 2021-08-12 MED ADMIN — hydrOXYzine (ATARAX) tablet 25 mg: 25 mg | ORAL | @ 14:00:00

## 2021-08-12 MED ADMIN — guaiFENesin (ROBITUSSIN) oral syrup: 100 mg | ORAL | @ 22:00:00

## 2021-08-12 MED ADMIN — clindamycin (CLEOCIN) capsule 300 mg: 300 mg | ORAL | @ 02:00:00 | Stop: 2021-08-22

## 2021-08-12 MED ADMIN — bumetanide (BUMEX) tablet 1 mg: 1 mg | ORAL | @ 13:00:00 | Stop: 2021-08-12

## 2021-08-12 MED ADMIN — enoxaparin (LOVENOX) syringe 40 mg: 40 mg | SUBCUTANEOUS | @ 02:00:00

## 2021-08-12 MED ADMIN — nystatin (MYCOSTATIN) powder 1 application: 1 | TOPICAL | @ 03:00:00

## 2021-08-12 MED ADMIN — cetirizine (ZyrTEC) tablet 10 mg: 10 mg | ORAL | @ 13:00:00

## 2021-08-12 MED ADMIN — nystatin (MYCOSTATIN) powder 1 application: 1 | TOPICAL | @ 13:00:00

## 2021-08-12 MED ADMIN — pantoprazole (PROTONIX) EC tablet 40 mg: 40 mg | ORAL | @ 13:00:00

## 2021-08-12 MED ADMIN — empagliflozin (JARDIANCE) tablet 10 mg: 10 mg | ORAL | @ 13:00:00

## 2021-08-12 MED ADMIN — lisinopriL (PRINIVIL,ZESTRIL) tablet 40 mg: 40 mg | ORAL | @ 13:00:00

## 2021-08-12 MED ADMIN — guaiFENesin (ROBITUSSIN) oral syrup: 100 mg | ORAL | @ 17:00:00

## 2021-08-12 MED ADMIN — guaiFENesin (ROBITUSSIN) oral syrup: 100 mg | ORAL | @ 09:00:00

## 2021-08-12 MED ADMIN — menthol (COUGH DROPS) lozenge 1 lozenge: 1 | ORAL | @ 03:00:00

## 2021-08-12 MED ADMIN — menthol (COUGH DROPS) lozenge 1 lozenge: 1 | ORAL | @ 22:00:00

## 2021-08-12 MED ADMIN — cholecalciferol (vitamin D3 25 mcg (1,000 units)) tablet 25 mcg: 25 ug | ORAL | @ 13:00:00

## 2021-08-12 NOTE — Unmapped (Signed)
A & O x 4,denies pain, cough relieved with  scheduled robitussin and menthol drops,continent B & B,LBM on 1/6, droplet and contact precautions maintained,call bell in reach, family at bed side, resting on bed, continue to monitor.  Problem: Rehabilitation (IRF) Plan of Care  Goal: Plan of Care Review  Outcome: Progressing  Goal: Patient-Specific Goal (Individualized)  Outcome: Progressing  Goal: Absence of New-Onset Illness or Injury  Outcome: Progressing  Intervention: Prevent Fall and Fall Injury  Recent Flowsheet Documentation  Taken 08/12/2021 0200 by Remi Deter, RN  Safety Interventions: fall reduction program maintained  Taken 08/12/2021 0000 by Remi Deter, RN  Safety Interventions: fall reduction program maintained  Taken 08/11/2021 2200 by Remi Deter, RN  Safety Interventions: fall reduction program maintained  Taken 08/11/2021 2000 by Remi Deter, RN  Safety Interventions: fall reduction program maintained  Intervention: Prevent Infection  Recent Flowsheet Documentation  Taken 08/12/2021 0200 by Remi Deter, RN  Infection Prevention: rest/sleep promoted  Taken 08/12/2021 0000 by Remi Deter, RN  Infection Prevention: rest/sleep promoted  Taken 08/11/2021 2200 by Remi Deter, RN  Infection Prevention: rest/sleep promoted  Taken 08/11/2021 2000 by Remi Deter, RN  Infection Prevention: rest/sleep promoted  Intervention: Prevent VTE (Venous Thromboembolism)  Recent Flowsheet Documentation  Taken 08/11/2021 2045 by Remi Deter, RN  VTE Prevention/Management: anticoagulant therapy  Goal: Optimal Comfort and Wellbeing  Outcome: Progressing  Goal: Home and Community Transition Plan Established  Outcome: Progressing

## 2021-08-12 NOTE — Unmapped (Signed)
A&O x4. Continent of bladder and bowel. LBM 08/12/21. Droplet and Contact precautions maintained. PRN Lozenge given to assist with cough in addition to scheduled Robitussin. In and Out measurements recorded. Itching experienced, Atarax given, successful. No pain or discomfort reported. Safety precautions in place and maintained. Hourly rounding performed by nursing staff.       Problem: Rehabilitation (IRF) Plan of Care  Goal: Plan of Care Review  Outcome: Progressing  Goal: Patient-Specific Goal (Individualized)  Outcome: Progressing  Goal: Absence of New-Onset Illness or Injury  Outcome: Progressing  Intervention: Prevent Fall and Fall Injury  Recent Flowsheet Documentation  Taken 08/12/2021 1400 by Fransisco Beau, RN  Safety Interventions: fall reduction program maintained  Taken 08/12/2021 1200 by Fransisco Beau, RN  Safety Interventions: fall reduction program maintained  Taken 08/12/2021 1000 by Fransisco Beau, RN  Safety Interventions: fall reduction program maintained  Taken 08/12/2021 0800 by Fransisco Beau, RN  Safety Interventions: fall reduction program maintained  Goal: Optimal Comfort and Wellbeing  Outcome: Progressing  Goal: Home and Community Transition Plan Established  Outcome: Progressing     Problem: Impaired Wound Healing  Goal: Optimal Wound Healing  Outcome: Progressing     Problem: Self-Care Deficit  Goal: Improved Ability to Complete Activities of Daily Living  Outcome: Progressing     Problem: Skin Injury Risk Increased  Goal: Skin Health and Integrity  Outcome: Progressing  Intervention: Optimize Skin Protection  Recent Flowsheet Documentation  Taken 08/12/2021 1400 by Fransisco Beau, RN  Pressure Reduction Techniques: frequent weight shift encouraged  Taken 08/12/2021 1200 by Fransisco Beau, RN  Pressure Reduction Techniques: frequent weight shift encouraged  Taken 08/12/2021 1000 by Fransisco Beau, RN  Pressure Reduction Techniques: frequent weight shift encouraged  Taken 08/12/2021 0800 by Fransisco Beau, RN  Pressure Reduction Techniques: frequent weight shift encouraged  Head of Bed (HOB) Positioning: HOB at 20-30 degrees     Problem: Hypertension Comorbidity  Goal: Blood Pressure in Desired Range  Outcome: Progressing     Problem: Fluid Volume Excess  Goal: Fluid Balance  Outcome: Progressing     Problem: Infection  Goal: Absence of Infection Signs and Symptoms  Outcome: Progressing  Intervention: Prevent or Manage Infection  Recent Flowsheet Documentation  Taken 08/12/2021 1400 by Fransisco Beau, RN  Isolation Precautions:   contact precautions maintained   droplet precautions maintained  Taken 08/12/2021 1300 by Fransisco Beau, RN  Isolation Precautions:   contact precautions maintained   droplet precautions maintained  Taken 08/12/2021 1200 by Fransisco Beau, RN  Isolation Precautions:   contact precautions maintained   droplet precautions maintained  Taken 08/12/2021 1100 by Fransisco Beau, RN  Isolation Precautions:   contact precautions maintained   droplet precautions maintained  Taken 08/12/2021 1000 by Fransisco Beau, RN  Isolation Precautions:   contact precautions maintained   droplet precautions maintained  Taken 08/12/2021 0800 by Fransisco Beau, RN  Isolation Precautions:   contact precautions maintained   droplet precautions maintained

## 2021-08-12 NOTE — Unmapped (Signed)
Family Medicine Inpatient Service  Brief Consult Note    ASSESSMENT/ PLAN:   Summer Russell is a 46 y.o. female with a history of HTN, HLD, GERD, uncontrolled type 2 diabetes, polyneuropathy, hypothyroidism,  psoriasis complicated by arthritis (on long-standing infliximab infusions), fibromyalgia, and sleep apnea admitted to [facility] 12/20 for severe hyperglycemia and lactic acidosis and nephrotic range proteinuria along with worsening left sided weakness, urinary retention and saddle anesthesia who is seen in consultation at the request of PMR colleages for evaluation of insulin management for T2DM.     # CKD I nephrotic range proteinuria I volume overload: Patient has long-standing history of uncontrolled DM, HTN and subsequent CKD w/ albuminuria. 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 Nephrology recommends outpatient renal biopsy and signed off 12/27. Weight improving with diuresis. Recent Echo w/ normal EF.   - Continue bumex 1mg  daily, follow up BMP from 1/7. Would monitor kidney function more closely given uptrending Cr.  - Okay to continue ACEi and SGLT2i   - Strict intake/output  - Compression stockings  - Daily weights   - Discussed restricting diet, so 2g Na and 2L fluid restricted diet the patient doesn't feel is reasonable especially to do both Na, fluid restriction and consistent carb  - Daily BMP, Mg while diuresing  [ ]  Reschedule outpatient nephrology follow-up appointment (currently scheduled on 08/11/2020)  [ ]  Agree with coordination of outpatient kidney biopsy with VIR prior to discharge    #Poorly controlled IDDM: continues to have intermittent hyperglycemic episodes above goal at least daily.   - Increase Glargine to 55 U BID  - Continue Lispro 25 with meals and SSI  - Recommend increasing Jardiance to 25 mg daily on or after 1/25  - Dietary changes as above  - Will CTM additional 24-48 hours before next recs.    Family medicine will continue to monitor BG and make recommendations accordingly.     Recommendations:  1. Increase glargine to 55 units BID    2. Continue Lispro to 25 units AC   3. Will CTM blood sugars 24-48 hours given new changes as above  4. Continue Resistant SSI   5. Continue Jardiance as SGLT2i is recommended in diabetic patients with CKD.   6. Continue ACEi for similar indication.   7. Consider carb restricted diet if unable to achieve consistent euglycemia  8. Re-check A1c ~3 months  9. Agree with endocrinology referral at discharge  10. Agree with stopping Metformin given CKD  11. Consider addition of GLP1-agonist inpatient vs outpatient for glycemic control and weight loss (patient would prefer daily vs once weekly injections given difficulties in adherence to once weekly previously)  12. Agree with diabetes education      Please page Family Medicine Admission Pager [phone]) for questions or further consult needs.     HPI:  Summer Russell is a 46 y.o. female with a history of HTN, HLD, GERD, uncontrolled type 2 diabetes, polyneuropathy, hypothyroidism,  psoriasis complicated by arthritis (on long-standing infliximab infusions), fibromyalgia, and sleep apnea admitted to [facility] following hospitalization with hyperglycemia and new focal deficits.     MEDICATIONS/ ALLERGIES:  No current facility-administered medications on file prior to encounter.     Current Outpatient Medications on File Prior to Encounter   Medication Sig    ascorbic Acid (VITAMIN C) 500 mg CpER Take 500 mg by mouth.    aspirin (ECOTRIN) 81 MG  tablet Take 81 mg by mouth daily.    atorvastatin (LIPITOR) 20 MG tablet Take 20 mg by mouth daily.    BD INSULIN SYRINGE ULTRA-FINE 1 mL 31 gauge x 5/16 (8 mm) Syrg USE 2 (TWO) TIMES DAILY AS DIRECTED    blood sugar diagnostic Strp Frequency:PHARMDIR   Dosage:0.0     Instructions:  Note:Use to test blood sugar in the morning before breakfast and in the evening two hours after dinner, ISD-9 250.00. Dose: 1    blood-glucose meter Misc U QID UTD    cetirizine (ZYRTEC) 10 MG tablet Take 10 mg by mouth daily.    cholecalciferol, vitamin D3, 1,000 unit (25 mcg) tablet Take 1,000 Units by mouth.    clindamycin (CLEOCIN) 300 MG capsule TAKE 1 CAPSULE BY MOUTH TWO TIMES A DAY.    empagliflozin (JARDIANCE) 10 mg tablet Take 1 tablet (10 mg total) by mouth daily.    empty container Misc Use as directed to dispose of Cosentyx pens.    fenofibrate (TRICOR) 48 MG tablet Take 1 tablet (48 mg total) by mouth daily.    finasteride (PROSCAR) 5 mg tablet TAKE 1 TABLET BY MOUTH EVERY DAY    fluconazole (DIFLUCAN) 150 MG tablet TAKE 1 TAB NOW AN THEN TAKE 1 TABLET ONCE WEEKLY AS NEEDED FOR YEAST INFECTION    gabapentin (NEURONTIN) 300 MG capsule Take 1 capsule (300 mg total) by mouth Two (2) times a day.    insulin glargine (LANTUS) 100 unit/mL injection Inject 0.6 mL (60 Units total) under the skin nightly.    insulin lispro (HUMALOG) 100 unit/mL injection Inject 0.1 mL (10 Units total) under the skin Three (3) times a day before meals.    levothyroxine (SYNTHROID, LEVOTHROID) 50 MCG tablet Take by mouth. Taking on Monday Wednesday Friday, Saturday & Sunday and on Tuesday & Thursday    lidocaine (XYLOCAINE) 5 % ointment Apply to painful areas every 3-4 hours as needed    lisinopriL (PRINIVIL,ZESTRIL) 40 MG tablet Take 1 tablet (40 mg total) by mouth daily.    multivitamin capsule Take 1 capsule by mouth daily.    pantoprazole (PROTONIX) 40 MG tablet Take 40 mg by mouth.    pen needle, diabetic 31 gauge x 5/16 Ndle Use 1 needle to skin four times daily as directed    secukinumab (COSENTYX) 150 mg/mL PnIj injection Inject the contents of 2 pens (300 mg total) under the skin every fourteen (14) days as maintenance doses.  START 14 days after last loading dose.     Allergies   Allergen Reactions    Venom-Honey Bee Other (See Comments)    Lamictal [Lamotrigine] Rash    Oxycodone Nausea And Vomiting       PHYSICAL EXAM:  Vitals:    08/12/21 0509   BP: 143/63   Pulse: 105   Resp: 18   Temp: 36.5 ??C (97.7 ??F)   SpO2: 100%       Raynelle Fanning, MD

## 2021-08-12 NOTE — Unmapped (Signed)
Physical Medicine and Rehabilitation  Daily Progress Note [facility]    ASSESSMENT:   Summer Russell is a 46 y.o. female with PMHx of HTN, HLD, GERD, uncontrolled type 2 diabetes, polyneuropathy, hypothyroidism, psoriasis complicated by arthritis (on long-standing infliximab infusions), fibromyalgia, and sleep apnea??admitted to [facility] 12/20 for severe hyperglycemia and lactic acidosis and nephrotic range proteinuria along with worsening left sided weakness, urinary retention and saddle anesthesia.??  ??  Impairment Group Code: (Neurologic Conditions) 03.3 Polyneuropathy  PLAN:     Weekend updates:  ?? 1/7: NAEON. Feeling fatigued today though feels slightly improved. Having ongoing nasal congestion not relieved by current medications. Amenable to short course of afrin spray. FM has not been by to see patient yet this morning.     REHAB:   - PT and OT to maximize functional status with mobility and ADLs as well as prevention of joint contracture   - RT for community re-integration, relaxation, and Support Group  - Discussed at next interdisciplinary team conference    ??  Lower extremity weakness/Sensory deficits: MRI brain negative (12/24). Patient unable to tolerate close spaces so spine MRI not obtainable. Patient may have lumbosacral plexopathy (such as diabetic amyotrophy vs polyneuropathy) in setting of known neuropathy, so EMG/NCS could be considered, however due to body habitus, may be more challenging to perform. Deferred EMG inpatient as symptoms improved with better glucose control. Initially with concern for urinary retention but passed TOV and continent of bladder since.   [ ]  Plan for outpatient referral to neurology to consider outpatient EMG/NCS  DVT Prophylaxis:  - Lovenox 40 mg BID (weight based)  Pain:  - Tylenol 1000 mg TID prn  ??  Hyperglycemia - Uncontrolled T2DM with insulin dependence: BG elevated to >400 during acute hospitalization (no AG or acidemia to suggest DKA). Diabetes education and FM consulted for insulin titration/comangement, appreciate recs. Continues to be hyperglycemic with fasting and throughout the day.  - Continue Jardiance 10 mg daily??  -??Continue Lantus 46U BID (increased 1/4)  - Continue Humalog 22U TIDAC (increased 1/3)  - resistant SSI  - Holding metformin given CKD  [ ]  plan for outpatient referral to [facility] endocrinology    RSV: Pt endorsed cough, intermittently productive, sore throat, generalized malaise, and intermittent lightheadedness. Per pt's spouse, he had similar symptoms during pt's acute admission, since resolved. Rapid respiratory PCR panel from 1/3 positive for RSV. Symptoms managed with prns appropriately. Continues to endorse nasal/sinus congestion and generalized fatigue. Afebrile, O2 sats wnl and other vitals stable.   - Continue Droplet and isolation precautions; continue until pt asymptomatic and at baseline respiratory status   - Continue scheduled guaifenesin 100mg  QID (start 1/5)   - Symptomatic care prn   ??  CKD - Nephrotic range proteinuria - B/l LE Edema/Hypervolemia: Nephrology consulted during acute hospitalization due to AKI, proteinuria and elevated lactate. 12/20 Echo w/ normal EF. 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 Recommended for outpt kidney biopsy to further evaluate. Daily standing weights have showed improvement in volume status with diuresis though increased 1/6 compared to previous. Pt subjectively reports decreased urination overnight compared to days previous. Has been hypoalbuminemic and low total prtn suggesting b/l LE edema has a component of 3rd spacing 2/2 low oncotic pressures. Discussed with FM who agree with decreasing diuretic to daily given mild sCr bump with labs 1/6.   - Continue empagliflozin 10mg  daily as tolerated for proteinuria  - HOLD home  OCP (ethinyl estradiol/drospirenone), given hypercoagulability associated with nephrotic syndrome per pharmacy recommendation.   - Decrease Bumex 1mg  to daily (decreased 1/6)  - Daily standing weights  - Strict I&O  [x]  Rescheduled outpatient nephrology follow-up appointment   [ ]  plan to coordinate outpatient kidney biopsy with VIR prior to discharge  ??  Hydradenitis suppurativa - Psoriasis: Managed by dermatology outpatient; on IV Renflexis, finasteride, and clindamycin.??S/p Renflexis infusion 12/19  - Continue home clindamycin 300mg  BID   - Continue Finasteride 5mg  daily    Chronic Problems:   Vertigo/nausea: zyrtec 10 mg daily, compazine prn (first line), Zofran prn (second line), meclizine TID prn   Hypothyroidism: Continue home Synthroid 50 mcg every day + 25 mcg T/Th  HTN: Lisinopril and Bumex as above   HLD: Continue home Lipitor 20 mg daily, fenofibrate 48 mg daily, ASA 81 mg daily  Sleep Apnea: Does not tolerate CPAP at bedtime  GERD: Continue home protonix 40 mg daily  Rhinitis - Cough:  lozenges, robitussin, flonase prn  ??  DISPO: Admitted to Rehab floor  EDD: 1/10  Follow up: PCP, neurology referral, nephrology, endocrinology referral     SUBJECTIVE:     Interval Events: See above.    OBJECTIVE:     Vital signs (last 24 hours):  Temp:  [36.5 ??C (97.7 ??F)] 36.5 ??C (97.7 ??F)  Heart Rate:  [99-105] 105  Resp:  [18] 18  BP: (132-143)/(63-70) 143/63  MAP (mmHg):  [83] 83  SpO2:  [100 %] 100 %    Intake/Output (last 3 shifts):  I/O last 3 completed shifts:  In: 1268.2 [P.O.:1268.2]  Out: 1863.1 [Urine:1833.6; Emesis/NG output:29.5]    Physical Exam:  GEN: NAD, sitting up in chair  EYES: sclera anicteric, conjunctiva clear   HENT: NCAT  NECK: trachea midline  CV: warm and well perfused extremities   RESP: NWOB on RA, without accessory muscle use or new O2 requirements  SKIN: no visible rashes or skin breakdow  MSK: no notable contractures; b/l LE non-pitting edema to knees  NEURO: Awake and alert, answers questions appropriately    Medications:  Scheduled   ??? aspirin chewable tablet 81 mg Daily   ??? atorvastatin (LIPITOR) tablet 20 mg Daily   ??? bumetanide (BUMEX) tablet 1 mg Daily   ??? cetirizine (ZyrTEC) tablet 10 mg Daily   ??? cholecalciferol (vitamin D3 25 mcg (1,000 units)) tablet 25 mcg Daily   ??? clindamycin (CLEOCIN) capsule 300 mg BID   ??? diclofenac sodium (VOLTAREN) 1 % gel 2 g QID   ??? empagliflozin (JARDIANCE) tablet 10 mg Daily   ??? enoxaparin (LOVENOX) syringe 40 mg Q12H   ??? fenofibrate (TRICOR) tablet 48 mg Daily   ??? finasteride (PROSCAR) tablet 5 mg Daily   ??? guaiFENesin (ROBITUSSIN) oral syrup Q4H   ??? insulin glargine (LANTUS) injection 49 Units BID   ??? insulin lispro (HumaLOG) injection 0-20 Units ACHS   ??? insulin lispro (HumaLOG) injection 25 Units TID AC   ??? levothyroxine (SYNTHROID) tablet 50 mcg Once per day on Sun Mon Wed Fri Sat   ??? levothyroxine (SYNTHROID) tablet 75 mcg Once per day on Tue Thu   ??? lisinopriL (PRINIVIL,ZESTRIL) tablet 40 mg Daily   ??? nystatin (MYCOSTATIN) powder 1 application BID   ??? oxymetazoline (AFRIN) 0.05 % nasal spray 3 spray BID   ??? pantoprazole (PROTONIX) EC tablet 40 mg Daily     PRN acetaminophen, 1,000 mg, Q8H PRN  bismuth subsalicylate, 30 mL, Q6H PRN  calcium carbonate, 200  mg of elem calcium, TID PRN  dextrose, 12.5 g, Q15 Min PRN  fluticasone propionate, 1 spray, Daily PRN  glucagon, 1 mg, Once PRN  glucose, 16 g, Q10 Min PRN  hydrocortisone, 25 mg, Daily PRN  hydrOXYzine, 25 mg, Q6H PRN  meclizine, 25 mg, TID PRN  cough/sore throat lozenge, 1 lozenge, Q2H PRN  prochlorperazine, 5 mg, Q6H PRN   And  ondansetron, 4 mg, BID PRN  polyethylene glycol, 17 g, Daily PRN  simethicone, 80 mg, TID PRN  sodium chloride, 1 spray, Q6H PRN  traZODone, 50 mg, Nightly PRN      Labs/Studies: Reviewed.    Radiology Results: Reviewed.

## 2021-08-12 NOTE — Unmapped (Signed)
A & O x 4. Continent of bladder and bowel. LBM 08/11/21. No c/o pain. Coughing with yellow/green sputum. Cough lozenge and Robitussin give to assist, mild success. Maintained droplet & contact precautions. Strict I & O maintained. Family at bedside. Safety and skin precautions in place and maintained. Hourly rounding performed by nursing staff.       Problem: Rehabilitation (IRF) Plan of Care  Goal: Plan of Care Review  Outcome: Progressing  Goal: Patient-Specific Goal (Individualized)  Outcome: Progressing  Goal: Absence of New-Onset Illness or Injury  Outcome: Progressing  Intervention: Prevent Fall and Fall Injury  Recent Flowsheet Documentation  Taken 08/11/2021 1600 by Fransisco Beau, RN  Safety Interventions: fall reduction program maintained  Taken 08/11/2021 1200 by Fransisco Beau, RN  Safety Interventions: fall reduction program maintained  Taken 08/11/2021 0800 by Fransisco Beau, RN  Safety Interventions: fall reduction program maintained  Goal: Optimal Comfort and Wellbeing  Outcome: Progressing  Goal: Home and Community Transition Plan Established  Outcome: Progressing     Problem: Impaired Wound Healing  Goal: Optimal Wound Healing  Outcome: Progressing     Problem: Self-Care Deficit  Goal: Improved Ability to Complete Activities of Daily Living  Outcome: Progressing     Problem: Skin Injury Risk Increased  Goal: Skin Health and Integrity  Outcome: Progressing  Intervention: Optimize Skin Protection  Recent Flowsheet Documentation  Taken 08/11/2021 1600 by Fransisco Beau, RN  Pressure Reduction Techniques: frequent weight shift encouraged  Taken 08/11/2021 1200 by Fransisco Beau, RN  Pressure Reduction Techniques: frequent weight shift encouraged  Taken 08/11/2021 0800 by Fransisco Beau, RN  Pressure Reduction Techniques: frequent weight shift encouraged  Head of Bed (HOB) Positioning: HOB at 20-30 degrees     Problem: Hypertension Comorbidity  Goal: Blood Pressure in Desired Range  Outcome: Progressing     Problem: Fluid Volume Excess  Goal: Fluid Balance  Outcome: Progressing     Problem: Infection  Goal: Absence of Infection Signs and Symptoms  Outcome: Progressing  Intervention: Prevent or Manage Infection  Recent Flowsheet Documentation  Taken 08/11/2021 1600 by Fransisco Beau, RN  Isolation Precautions:   contact precautions maintained   droplet precautions maintained  Taken 08/11/2021 1400 by Fransisco Beau, RN  Isolation Precautions:   contact precautions maintained   droplet precautions maintained  Taken 08/11/2021 1300 by Fransisco Beau, RN  Isolation Precautions:   contact precautions maintained   droplet precautions maintained  Taken 08/11/2021 1200 by Fransisco Beau, RN  Isolation Precautions:   contact precautions maintained   droplet precautions maintained  Taken 08/11/2021 1100 by Fransisco Beau, RN  Isolation Precautions:   contact precautions maintained   droplet precautions maintained  Taken 08/11/2021 1000 by Fransisco Beau, RN  Isolation Precautions:   contact precautions maintained   droplet precautions maintained  Taken 08/11/2021 0900 by Fransisco Beau, RN  Isolation Precautions:   contact precautions maintained   droplet precautions maintained  Taken 08/11/2021 0800 by Fransisco Beau, RN  Isolation Precautions:   contact precautions maintained   droplet precautions maintained

## 2021-08-13 LAB — BASIC METABOLIC PANEL
ANION GAP: 9 mmol/L (ref 5–14)
BLOOD UREA NITROGEN: 27 mg/dL — ABNORMAL HIGH (ref 9–23)
BUN / CREAT RATIO: 25
CALCIUM: 9.3 mg/dL (ref 8.7–10.4)
CHLORIDE: 106 mmol/L (ref 98–107)
CO2: 24.9 mmol/L (ref 20.0–31.0)
CREATININE: 1.1 mg/dL — ABNORMAL HIGH
EGFR CKD-EPI (2021) FEMALE: 64 mL/min/{1.73_m2} (ref >=60)
GLUCOSE RANDOM: 288 mg/dL — ABNORMAL HIGH (ref 70–179)
POTASSIUM: 4.2 mmol/L (ref 3.4–4.8)
SODIUM: 140 mmol/L (ref 135–145)

## 2021-08-13 MED ORDER — INSULIN LISPRO (U-100) 100 UNIT/ML SUBCUTANEOUS SOLUTION
Freq: Three times a day (TID) | SUBCUTANEOUS | 1 refills | 40 days
Start: 2021-08-13 — End: 2021-09-12

## 2021-08-13 MED ORDER — PEN NEEDLE, DIABETIC 32 GAUGE X 5/32" (4 MM)
0 refills | 0 days
Start: 2021-08-13 — End: ?

## 2021-08-13 MED ORDER — FENOFIBRATE NANOCRYSTALLIZED 48 MG TABLET
ORAL_TABLET | Freq: Every day | ORAL | 0 refills | 30.00000 days
Start: 2021-08-13 — End: 2021-09-12

## 2021-08-13 MED ORDER — ASPIRIN 81 MG TABLET,DELAYED RELEASE
ORAL_TABLET | Freq: Every day | ORAL | 0 refills | 30 days
Start: 2021-08-13 — End: 2021-09-12

## 2021-08-13 MED ORDER — NYSTATIN 100,000 UNIT/GRAM TOPICAL POWDER
Freq: Two times a day (BID) | TOPICAL | 0 refills | 30 days
Start: 2021-08-13 — End: 2022-08-13

## 2021-08-13 MED ORDER — INSULIN GLARGINE (U-100) 100 UNIT/ML SUBCUTANEOUS SOLUTION
Freq: Two times a day (BID) | SUBCUTANEOUS | 1 refills | 36.00000 days
Start: 2021-08-13 — End: 2021-09-12

## 2021-08-13 MED ORDER — LISINOPRIL 40 MG TABLET
ORAL_TABLET | Freq: Every day | ORAL | 0 refills | 30 days
Start: 2021-08-13 — End: 2021-09-12

## 2021-08-13 MED ORDER — CETIRIZINE 10 MG TABLET
ORAL_TABLET | Freq: Every day | ORAL | 0 refills | 30 days
Start: 2021-08-13 — End: ?

## 2021-08-13 MED ORDER — FINASTERIDE 5 MG TABLET
ORAL_TABLET | Freq: Every day | ORAL | 0 refills | 30.00000 days
Start: 2021-08-13 — End: 2021-09-12

## 2021-08-13 MED ORDER — CHOLECALCIFEROL (VITAMIN D3) 25 MCG (1,000 UNIT) TABLET
ORAL_TABLET | Freq: Every day | ORAL | 0 refills | 30.00000 days
Start: 2021-08-13 — End: ?

## 2021-08-13 MED ORDER — LEVOTHYROXINE 75 MCG TABLET
ORAL_TABLET | 0 refills | 0.00000 days
Start: 2021-08-13 — End: 2022-08-13

## 2021-08-13 MED ORDER — EMPAGLIFLOZIN 10 MG TABLET
ORAL_TABLET | Freq: Every day | ORAL | 0 refills | 30.00000 days
Start: 2021-08-13 — End: 2021-09-12

## 2021-08-13 MED ORDER — ATORVASTATIN 20 MG TABLET
ORAL_TABLET | Freq: Every day | ORAL | 0 refills | 30 days
Start: 2021-08-13 — End: 2021-09-12

## 2021-08-13 MED ORDER — CLINDAMYCIN HCL 300 MG CAPSULE
ORAL_CAPSULE | Freq: Two times a day (BID) | ORAL | 0 refills | 30.00000 days
Start: 2021-08-13 — End: 2021-09-12

## 2021-08-13 MED ORDER — BLOOD GLUCOSE TEST STRIPS
ORAL_STRIP | 0 refills | 0 days
Start: 2021-08-13 — End: ?

## 2021-08-13 MED ORDER — BLOOD-GLUCOSE METER KIT WRAPPER
0 refills | 0 days
Start: 2021-08-13 — End: 2022-08-13

## 2021-08-13 MED ORDER — HYDROXYZINE HCL 25 MG TABLET
ORAL_TABLET | Freq: Two times a day (BID) | ORAL | 0 refills | 15 days | PRN
Start: 2021-08-13 — End: ?

## 2021-08-13 MED ORDER — LEVOTHYROXINE 50 MCG TABLET
ORAL_TABLET | 3 refills | 0.00000 days
Start: 2021-08-13 — End: 2022-08-13

## 2021-08-13 MED ORDER — LANCETS
0 refills | 0.00000 days
Start: 2021-08-13 — End: ?

## 2021-08-13 MED ORDER — INSULIN SYRINGE U-100 WITH NEEDLE 1 ML 31 GAUGE X 1/4" (6 MM)
0 refills | 0.00000 days
Start: 2021-08-13 — End: ?

## 2021-08-13 MED ORDER — PANTOPRAZOLE 40 MG TABLET,DELAYED RELEASE
ORAL_TABLET | Freq: Every day | ORAL | 0 refills | 30 days
Start: 2021-08-13 — End: 2021-09-12

## 2021-08-13 MED ORDER — ONDANSETRON HCL 4 MG TABLET
ORAL_TABLET | Freq: Every day | ORAL | 0 refills | 15.00000 days | PRN
Start: 2021-08-13 — End: 2021-08-28

## 2021-08-13 MED ADMIN — empagliflozin (JARDIANCE) tablet 10 mg: 10 mg | ORAL | @ 14:00:00

## 2021-08-13 MED ADMIN — insulin lispro (HumaLOG) injection 0-20 Units: 0-20 [IU] | SUBCUTANEOUS | @ 18:00:00

## 2021-08-13 MED ADMIN — insulin lispro (HumaLOG) injection 25 Units: 25 [IU] | SUBCUTANEOUS | @ 23:00:00

## 2021-08-13 MED ADMIN — guaiFENesin (ROBITUSSIN) oral syrup: 100 mg | ORAL | @ 23:00:00

## 2021-08-13 MED ADMIN — atorvastatin (LIPITOR) tablet 20 mg: 20 mg | ORAL | @ 14:00:00

## 2021-08-13 MED ADMIN — insulin lispro (HumaLOG) injection 0-20 Units: 0-20 [IU] | SUBCUTANEOUS | @ 23:00:00

## 2021-08-13 MED ADMIN — fenofibrate (TRICOR) tablet 48 mg: 48 mg | ORAL | @ 14:00:00

## 2021-08-13 MED ADMIN — guaiFENesin (ROBITUSSIN) oral syrup: 100 mg | ORAL | @ 02:00:00

## 2021-08-13 MED ADMIN — oxymetazoline (AFRIN) 0.05 % nasal spray 3 spray: 3 | NASAL | @ 14:00:00 | Stop: 2021-08-15

## 2021-08-13 MED ADMIN — acetaminophen (TYLENOL) tablet 1,000 mg: 1000 mg | ORAL | @ 20:00:00

## 2021-08-13 MED ADMIN — insulin lispro (HumaLOG) injection 0-20 Units: 0-20 [IU] | SUBCUTANEOUS | @ 02:00:00

## 2021-08-13 MED ADMIN — nystatin (MYCOSTATIN) powder 1 application: 1 | TOPICAL | @ 14:00:00

## 2021-08-13 MED ADMIN — finasteride (PROSCAR) tablet 5 mg: 5 mg | ORAL | @ 14:00:00

## 2021-08-13 MED ADMIN — enoxaparin (LOVENOX) syringe 40 mg: 40 mg | SUBCUTANEOUS | @ 02:00:00

## 2021-08-13 MED ADMIN — acetaminophen (TYLENOL) tablet 1,000 mg: 1000 mg | ORAL | @ 05:00:00

## 2021-08-13 MED ADMIN — pantoprazole (PROTONIX) EC tablet 40 mg: 40 mg | ORAL | @ 14:00:00

## 2021-08-13 MED ADMIN — menthol (COUGH DROPS) lozenge 1 lozenge: 1 | ORAL | @ 05:00:00

## 2021-08-13 MED ADMIN — guaiFENesin (ROBITUSSIN) oral syrup: 100 mg | ORAL | @ 05:00:00

## 2021-08-13 MED ADMIN — insulin glargine (LANTUS) injection 55 Units: 55 [IU] | SUBCUTANEOUS | @ 14:00:00

## 2021-08-13 MED ADMIN — insulin lispro (HumaLOG) injection 25 Units: 25 [IU] | SUBCUTANEOUS | @ 14:00:00

## 2021-08-13 MED ADMIN — menthol (COUGH DROPS) lozenge 1 lozenge: 1 | ORAL | @ 12:00:00

## 2021-08-13 MED ADMIN — clindamycin (CLEOCIN) capsule 300 mg: 300 mg | ORAL | @ 02:00:00 | Stop: 2021-08-22

## 2021-08-13 MED ADMIN — insulin glargine (LANTUS) injection 55 Units: 55 [IU] | SUBCUTANEOUS | @ 02:00:00

## 2021-08-13 MED ADMIN — guaiFENesin (ROBITUSSIN) oral syrup: 100 mg | ORAL | @ 18:00:00

## 2021-08-13 MED ADMIN — nystatin (MYCOSTATIN) powder 1 application: 1 | TOPICAL | @ 02:00:00

## 2021-08-13 MED ADMIN — guaiFENesin (ROBITUSSIN) oral syrup: 100 mg | ORAL | @ 14:00:00

## 2021-08-13 MED ADMIN — cetirizine (ZyrTEC) tablet 10 mg: 10 mg | ORAL | @ 14:00:00

## 2021-08-13 MED ADMIN — levothyroxine (SYNTHROID) tablet 50 mcg: 50 ug | ORAL | @ 12:00:00

## 2021-08-13 MED ADMIN — clindamycin (CLEOCIN) capsule 300 mg: 300 mg | ORAL | @ 14:00:00 | Stop: 2021-08-22

## 2021-08-13 MED ADMIN — oxymetazoline (AFRIN) 0.05 % nasal spray 3 spray: 3 | NASAL | @ 02:00:00 | Stop: 2021-08-15

## 2021-08-13 MED ADMIN — lisinopriL (PRINIVIL,ZESTRIL) tablet 40 mg: 40 mg | ORAL | @ 14:00:00

## 2021-08-13 MED ADMIN — guaiFENesin (ROBITUSSIN) oral syrup: 100 mg | ORAL | @ 09:00:00

## 2021-08-13 MED ADMIN — menthol (COUGH DROPS) lozenge 1 lozenge: 1 | ORAL | @ 01:00:00

## 2021-08-13 MED ADMIN — menthol (COUGH DROPS) lozenge 1 lozenge: 1 | ORAL | @ 14:00:00

## 2021-08-13 MED ADMIN — insulin lispro (HumaLOG) injection 25 Units: 25 [IU] | SUBCUTANEOUS | @ 18:00:00

## 2021-08-13 MED ADMIN — aspirin chewable tablet 81 mg: 81 mg | ORAL | @ 14:00:00

## 2021-08-13 MED ADMIN — enoxaparin (LOVENOX) syringe 40 mg: 40 mg | SUBCUTANEOUS | @ 14:00:00

## 2021-08-13 MED ADMIN — insulin lispro (HumaLOG) injection 0-20 Units: 0-20 [IU] | SUBCUTANEOUS | @ 12:00:00

## 2021-08-13 MED ADMIN — cholecalciferol (vitamin D3 25 mcg (1,000 units)) tablet 25 mcg: 25 ug | ORAL | @ 14:00:00

## 2021-08-13 NOTE — Unmapped (Signed)
Physical Medicine and Rehabilitation  Daily Progress Note [facility]    ASSESSMENT:   Summer Russell is a 46 y.o. female with PMHx of HTN, HLD, GERD, uncontrolled type 2 diabetes, polyneuropathy, hypothyroidism, psoriasis complicated by arthritis (on long-standing infliximab infusions), fibromyalgia, and sleep apnea??admitted to [facility] 12/20 for severe hyperglycemia and lactic acidosis and nephrotic range proteinuria along with worsening left sided weakness, urinary retention and saddle anesthesia.??  ??  Impairment Group Code: (Neurologic Conditions) 03.3 Polyneuropathy  PLAN:     Weekend updates:  ?? 1/7: NAEON. Feeling fatigued today though feels slightly improved. Having ongoing nasal congestion not relieved by current medications. Amenable to short course of afrin spray. FM has not been by to see patient yet this morning.   ?? 1/8: NAEON. Discontinued ensures as it limits her daily sodium intake and she was not able to order dinner last night as a result. FM increased Glargine yesterday. Changed BMP to daily given Cr is rising.     REHAB:   - PT and OT to maximize functional status with mobility and ADLs as well as prevention of joint contracture   - RT for community re-integration, relaxation, and Support Group  - Discussed at next interdisciplinary team conference    ??  Lower extremity weakness/Sensory deficits: MRI brain negative (12/24). Patient unable to tolerate close spaces so spine MRI not obtainable. Patient may have lumbosacral plexopathy (such as diabetic amyotrophy vs polyneuropathy) in setting of known neuropathy, so EMG/NCS could be considered, however due to body habitus, may be more challenging to perform. Deferred EMG inpatient as symptoms improved with better glucose control. Initially with concern for urinary retention but passed TOV and continent of bladder since.   [ ]  Plan for outpatient referral to neurology to consider outpatient EMG/NCS  DVT Prophylaxis:  - Lovenox 40 mg BID (weight based)  Pain:  - Tylenol 1000 mg TID prn  ??  Hyperglycemia - Uncontrolled T2DM with insulin dependence: BG elevated to >400 during acute hospitalization (no AG or acidemia to suggest DKA). Diabetes education and FM consulted for insulin titration/comangement, appreciate recs. Continues to be hyperglycemic with fasting and throughout the day.  - Continue Jardiance 10 mg daily??  -??Continue Lantus 46U BID (increased 1/4)  - Continue Humalog 22U TIDAC (increased 1/3)  - resistant SSI  - Holding metformin given CKD  [ ]  plan for outpatient referral to [facility] endocrinology    RSV: Pt endorsed cough, intermittently productive, sore throat, generalized malaise, and intermittent lightheadedness. Per pt's spouse, he had similar symptoms during pt's acute admission, since resolved. Rapid respiratory PCR panel from 1/3 positive for RSV. Symptoms managed with prns appropriately. Continues to endorse nasal/sinus congestion and generalized fatigue. Afebrile, O2 sats wnl and other vitals stable.   - Continue Droplet and isolation precautions; continue until pt asymptomatic and at baseline respiratory status   - Continue scheduled guaifenesin 100mg  QID (start 1/5)   - Symptomatic care prn   ??  CKD - Nephrotic range proteinuria - B/l LE Edema/Hypervolemia: Nephrology consulted during acute hospitalization due to AKI, proteinuria and elevated lactate. 12/20 Echo w/ normal EF. 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 Recommended for outpt kidney biopsy to further evaluate. Daily standing weights have showed improvement in volume status with diuresis though increased 1/6 compared to previous. Pt subjectively reports decreased urination overnight compared to days previous. Has been hypoalbuminemic and low total prtn suggesting b/l LE edema has a component  of 3rd spacing 2/2 low oncotic pressures. Discussed with FM who agree with decreasing diuretic to daily given mild sCr bump with labs 1/6.   - Continue empagliflozin 10mg  daily as tolerated for proteinuria  - HOLD home OCP (ethinyl estradiol/drospirenone), given hypercoagulability associated with nephrotic syndrome per pharmacy recommendation.   - Decrease Bumex 1mg  to daily (decreased 1/6)  - Daily standing weights  - Strict I&O  [x]  Rescheduled outpatient nephrology follow-up appointment   [ ]  plan to coordinate outpatient kidney biopsy with [facility]IR prior to discharge  ??  Hydradenitis suppurativa - Psoriasis: Managed by dermatology outpatient; on IV Renflexis, finasteride, and clindamycin.??S/p Renflexis infusion 12/19  - Continue home clindamycin 300mg  BID   - Continue Finasteride 5mg  daily    Chronic Problems:   Vertigo/nausea: zyrtec 10 mg daily, compazine prn (first line), Zofran prn (second line), meclizine TID prn   Hypothyroidism: Continue home Synthroid 50 mcg every day + 25 mcg T/Th  HTN: Lisinopril and Bumex as above   HLD: Continue home Lipitor 20 mg daily, fenofibrate 48 mg daily, ASA 81 mg daily  Sleep Apnea: Does not tolerate CPAP at bedtime  GERD: Continue home protonix 40 mg daily  Rhinitis - Cough:  lozenges, robitussin, flonase prn  ??  DISPO: Admitted to Rehab floor  EDD: 1/10  Follow up: PCP, neurology referral, nephrology, endocrinology referral     SUBJECTIVE:     Interval Events: See above.    OBJECTIVE:     Vital signs (last 24 hours):  Temp:  [36.5 ??C (97.7 ??F)-36.7 ??C (98.1 ??F)] 36.7 ??C (98.1 ??F)  Heart Rate:  [96-97] 96  Resp:  [18] 18  BP: (140-146)/(69-76) 146/76  MAP (mmHg):  [83] 83  SpO2:  [99 %-100 %] 99 %    Intake/Output (last 3 shifts):  I/O last 3 completed shifts:  In: 1268.5 [P.O.:1268.5]  Out: 3844.6 [Urine:3844.6]    Physical Exam:  GEN: NAD, laying in bed   EYES: sclera anicteric, conjunctiva clear   HENT: NCAT  NECK: trachea midline  CV: warm and well perfused extremities   RESP: NWOB on RA, without accessory muscle use   SKIN: no visible rashes or skin breakdow  MSK: no notable contractures  NEURO: Awake and alert, answers questions appropriately    Medications:  Scheduled   ??? aspirin chewable tablet 81 mg Daily   ??? atorvastatin (LIPITOR) tablet 20 mg Daily   ??? cetirizine (ZyrTEC) tablet 10 mg Daily   ??? cholecalciferol (vitamin D3 25 mcg (1,000 units)) tablet 25 mcg Daily   ??? clindamycin (CLEOCIN) capsule 300 mg BID   ??? diclofenac sodium (VOLTAREN) 1 % gel 2 g QID   ??? empagliflozin (JARDIANCE) tablet 10 mg Daily   ??? enoxaparin (LOVENOX) syringe 40 mg Q12H   ??? fenofibrate (TRICOR) tablet 48 mg Daily   ??? finasteride (PROSCAR) tablet 5 mg Daily   ??? guaiFENesin (ROBITUSSIN) oral syrup Q4H   ??? insulin glargine (LANTUS) injection 55 Units BID   ??? insulin lispro (HumaLOG) injection 0-20 Units ACHS   ??? insulin lispro (HumaLOG) injection 25 Units TID AC   ??? levothyroxine (SYNTHROID) tablet 50 mcg Once per day on Sun Mon Wed Fri Sat   ??? levothyroxine (SYNTHROID) tablet 75 mcg Once per day on Tue Thu   ??? lisinopriL (PRINIVIL,ZESTRIL) tablet 40 mg Daily   ??? nystatin (MYCOSTATIN) powder 1 application BID   ??? oxymetazoline (AFRIN) 0.05 % nasal spray 3 spray BID   ??? pantoprazole (PROTONIX) EC tablet 40  mg Daily     PRN acetaminophen, 1,000 mg, Q8H PRN  bismuth subsalicylate, 30 mL, Q6H PRN  calcium carbonate, 200 mg of elem calcium, TID PRN  dextrose, 12.5 g, Q15 Min PRN  fluticasone propionate, 1 spray, Daily PRN  glucagon, 1 mg, Once PRN  glucose, 16 g, Q10 Min PRN  hydrocortisone, 25 mg, Daily PRN  hydrOXYzine, 25 mg, Q6H PRN  meclizine, 25 mg, TID PRN  cough/sore throat lozenge, 1 lozenge, Q2H PRN  prochlorperazine, 5 mg, Q6H PRN   And  ondansetron, 4 mg, BID PRN  polyethylene glycol, 17 g, Daily PRN  simethicone, 80 mg, TID PRN  sodium chloride, 1 spray, Q6H PRN  traZODone, 50 mg, Nightly PRN      Labs/Studies: Reviewed.    Radiology Results: Reviewed.

## 2021-08-13 NOTE — Unmapped (Signed)
A & O x 4,denies pain, cough relieved with  scheduled robitussin and menthol drops,continent B & B, droplet and contact precautions maintained,call bell in reach, family at bed side, resting on bed, continue to monitor.  Problem: Rehabilitation (IRF) Plan of Care  Goal: Plan of Care Review  Outcome: Progressing  Goal: Patient-Specific Goal (Individualized)  Outcome: Progressing  Goal: Absence of New-Onset Illness or Injury  Outcome: Progressing  Intervention: Prevent Fall and Fall Injury  Recent Flowsheet Documentation  Taken 08/13/2021 0000 by Lanice Schwab, RN  Safety Interventions: fall reduction program maintained  Taken 08/12/2021 2200 by Lanice Schwab, RN  Safety Interventions: fall reduction program maintained  Taken 08/12/2021 2000 by Lanice Schwab, RN  Safety Interventions: fall reduction program maintained  Goal: Optimal Comfort and Wellbeing  Outcome: Progressing  Goal: Home and Community Transition Plan Established  Outcome: Progressing     Problem: Impaired Wound Healing  Goal: Optimal Wound Healing  Outcome: Progressing     Problem: Self-Care Deficit  Goal: Improved Ability to Complete Activities of Daily Living  Outcome: Progressing     Problem: Skin Injury Risk Increased  Goal: Skin Health and Integrity  Outcome: Progressing  Intervention: Optimize Skin Protection  Recent Flowsheet Documentation  Taken 08/13/2021 0000 by Lanice Schwab, RN  Pressure Reduction Techniques: frequent weight shift encouraged  Head of Bed (HOB) Positioning: HOB elevated  Pressure Reduction Devices: positioning supports utilized  Taken 08/12/2021 2200 by Lanice Schwab, RN  Pressure Reduction Techniques: frequent weight shift encouraged  Head of Bed (HOB) Positioning: HOB elevated  Pressure Reduction Devices: positioning supports utilized  Taken 08/12/2021 2000 by Lanice Schwab, RN  Pressure Reduction Techniques: frequent weight shift encouraged  Head of Bed (HOB) Positioning: HOB at 20-30 degrees  Pressure Reduction Devices: positioning supports utilized  Skin Protection: incontinence pads utilized     Problem: Hypertension Comorbidity  Goal: Blood Pressure in Desired Range  Outcome: Progressing     Problem: Fluid Volume Excess  Goal: Fluid Balance  Outcome: Progressing  Intervention: Monitor and Manage Hypervolemia  Recent Flowsheet Documentation  Taken 08/12/2021 2000 by Lanice Schwab, RN  Skin Protection: incontinence pads utilized     Problem: Infection  Goal: Absence of Infection Signs and Symptoms  Outcome: Progressing  Intervention: Prevent or Manage Infection  Recent Flowsheet Documentation  Taken 08/13/2021 0000 by Lanice Schwab, RN  Infection Management: aseptic technique maintained  Taken 08/12/2021 2000 by Lanice Schwab, RN  Infection Management: aseptic technique maintained  Isolation Precautions:   droplet precautions maintained   contact precautions maintained

## 2021-08-14 LAB — BASIC METABOLIC PANEL
ANION GAP: 9 mmol/L (ref 5–14)
BLOOD UREA NITROGEN: 27 mg/dL — ABNORMAL HIGH (ref 9–23)
BUN / CREAT RATIO: 24
CALCIUM: 9.1 mg/dL (ref 8.7–10.4)
CHLORIDE: 107 mmol/L (ref 98–107)
CO2: 24.3 mmol/L (ref 20.0–31.0)
CREATININE: 1.11 mg/dL — ABNORMAL HIGH
EGFR CKD-EPI (2021) FEMALE: 63 mL/min/{1.73_m2} (ref >=60)
GLUCOSE RANDOM: 179 mg/dL (ref 70–179)
POTASSIUM: 4.2 mmol/L (ref 3.4–4.8)
SODIUM: 140 mmol/L (ref 135–145)

## 2021-08-14 LAB — CBC
HEMATOCRIT: 28.9 % — ABNORMAL LOW (ref 34.0–44.0)
HEMOGLOBIN: 10.3 g/dL — ABNORMAL LOW (ref 11.3–14.9)
MEAN CORPUSCULAR HEMOGLOBIN CONC: 35.6 g/dL (ref 32.0–36.0)
MEAN CORPUSCULAR HEMOGLOBIN: 32.3 pg (ref 25.9–32.4)
MEAN CORPUSCULAR VOLUME: 90.7 fL (ref 77.6–95.7)
MEAN PLATELET VOLUME: 8.1 fL (ref 6.8–10.7)
PLATELET COUNT: 216 10*9/L (ref 150–450)
RED BLOOD CELL COUNT: 3.19 10*12/L — ABNORMAL LOW (ref 3.95–5.13)
RED CELL DISTRIBUTION WIDTH: 13.8 % (ref 12.2–15.2)
WBC ADJUSTED: 3.9 10*9/L (ref 3.6–11.2)

## 2021-08-14 LAB — MAGNESIUM: MAGNESIUM: 1.7 mg/dL (ref 1.6–2.6)

## 2021-08-14 MED ORDER — PANTOPRAZOLE 40 MG TABLET,DELAYED RELEASE
ORAL_TABLET | Freq: Every day | ORAL | 0 refills | 30 days | Status: CP
Start: 2021-08-14 — End: 2021-09-13

## 2021-08-14 MED ORDER — BLOOD-GLUCOSE METER
0 refills | 0.00000 days | Status: CP
Start: 2021-08-14 — End: ?

## 2021-08-14 MED ORDER — HYDROXYZINE HCL 25 MG TABLET
ORAL_TABLET | Freq: Two times a day (BID) | ORAL | 0 refills | 15 days | Status: CP | PRN
Start: 2021-08-14 — End: ?
  Filled 2021-08-15: qty 30, 15d supply, fill #0

## 2021-08-14 MED ORDER — CLINDAMYCIN HCL 300 MG CAPSULE
ORAL_CAPSULE | Freq: Two times a day (BID) | ORAL | 0 refills | 30.00000 days | Status: CP
Start: 2021-08-14 — End: 2021-09-13
  Filled 2021-08-15: qty 60, 30d supply, fill #0

## 2021-08-14 MED ORDER — LANCETS
0 refills | 0.00000 days | Status: CP
Start: 2021-08-14 — End: ?

## 2021-08-14 MED ORDER — LEVOTHYROXINE 75 MCG TABLET
ORAL_TABLET | 0 refills | 0.00000 days | Status: CP
Start: 2021-08-14 — End: 2022-08-13
  Filled 2021-08-15: qty 24, 84d supply, fill #0

## 2021-08-14 MED ORDER — GUAIFENESIN 100 MG/5 ML ORAL LIQUID
Freq: Four times a day (QID) | ORAL | 0 refills | 6 days | Status: CP | PRN
Start: 2021-08-14 — End: ?

## 2021-08-14 MED ORDER — ONDANSETRON HCL 4 MG TABLET
ORAL_TABLET | Freq: Every day | ORAL | 0 refills | 15 days | Status: CP | PRN
Start: 2021-08-14 — End: 2021-08-29
  Filled 2021-08-15: qty 15, 15d supply, fill #0

## 2021-08-14 MED ORDER — FENOFIBRATE NANOCRYSTALLIZED 48 MG TABLET
ORAL_TABLET | Freq: Every day | ORAL | 0 refills | 30 days | Status: CP
Start: 2021-08-14 — End: 2021-09-13
  Filled 2021-08-15: qty 30, 30d supply, fill #0

## 2021-08-14 MED ORDER — ACCU-CHEK GUIDE TEST STRIPS
SUBCUTANEOUS | 0 refills | 0.00000 days | Status: CP
Start: 2021-08-14 — End: ?

## 2021-08-14 MED ORDER — INSULIN LISPRO (U-100) 100 UNIT/ML SUBCUTANEOUS PEN
Freq: Three times a day (TID) | SUBCUTANEOUS | 1 refills | 40.00000 days | Status: CP
Start: 2021-08-14 — End: 2021-09-23
  Filled 2021-08-15: qty 30, 27d supply, fill #0

## 2021-08-14 MED ORDER — PEN NEEDLE, DIABETIC 32 GAUGE X 5/32" (4 MM)
0 refills | 0.00000 days | Status: CP
Start: 2021-08-14 — End: ?

## 2021-08-14 MED ORDER — EMPAGLIFLOZIN 25 MG TABLET
ORAL_TABLET | Freq: Every day | ORAL | 0 refills | 30 days | Status: CP
Start: 2021-08-14 — End: 2021-09-13
  Filled 2021-08-15: qty 30, 30d supply, fill #0

## 2021-08-14 MED ORDER — LEVOTHYROXINE 50 MCG TABLET
ORAL_TABLET | 3 refills | 0 days | Status: CP
Start: 2021-08-14 — End: 2022-08-13
  Filled 2021-08-15: qty 60, 84d supply, fill #0

## 2021-08-14 MED ORDER — ASPIRIN 81 MG TABLET,DELAYED RELEASE
ORAL_TABLET | Freq: Every day | ORAL | 0 refills | 30.00000 days | Status: CP
Start: 2021-08-14 — End: 2021-09-13
  Filled 2021-08-15: qty 30, 30d supply, fill #0

## 2021-08-14 MED ORDER — LISINOPRIL 40 MG TABLET
ORAL_TABLET | Freq: Every day | ORAL | 0 refills | 30 days | Status: CP
Start: 2021-08-14 — End: 2021-09-13
  Filled 2021-08-15: qty 30, 30d supply, fill #0

## 2021-08-14 MED ORDER — FLUTICASONE PROPIONATE 50 MCG/ACTUATION NASAL SPRAY,SUSPENSION
Freq: Every day | NASAL | 0 refills | 123 days | Status: CP | PRN
Start: 2021-08-14 — End: 2022-08-14
  Filled 2021-08-15: qty 16, 60d supply, fill #0

## 2021-08-14 MED ORDER — INSULIN GLARGINE (U-100) 100 UNIT/ML (3 ML) SUBCUTANEOUS PEN
Freq: Two times a day (BID) | SUBCUTANEOUS | 1 refills | 36 days | Status: CP
Start: 2021-08-14 — End: 2021-09-13

## 2021-08-14 MED ORDER — NYSTATIN 100,000 UNIT/GRAM TOPICAL POWDER
Freq: Two times a day (BID) | TOPICAL | 0 refills | 30.00000 days | Status: CP
Start: 2021-08-14 — End: 2022-08-14

## 2021-08-14 MED ORDER — CETIRIZINE 10 MG TABLET
ORAL_TABLET | Freq: Every day | ORAL | 0 refills | 30.00000 days | Status: CP
Start: 2021-08-14 — End: ?
  Filled 2021-08-15: qty 30, 30d supply, fill #0

## 2021-08-14 MED ORDER — INSULIN SYRINGE U-100 WITH NEEDLE 1 ML 31 GAUGE X 5/16" (8 MM)
0 refills | 0 days | Status: CP
Start: 2021-08-14 — End: ?

## 2021-08-14 MED ORDER — FINASTERIDE 5 MG TABLET
ORAL_TABLET | Freq: Every day | ORAL | 0 refills | 30 days | Status: CP
Start: 2021-08-14 — End: 2021-09-13

## 2021-08-14 MED ORDER — ATORVASTATIN 20 MG TABLET
ORAL_TABLET | Freq: Every day | ORAL | 0 refills | 30.00000 days | Status: CP
Start: 2021-08-14 — End: 2021-09-13
  Filled 2021-08-15: qty 30, 30d supply, fill #0

## 2021-08-14 MED ORDER — CHOLECALCIFEROL (VITAMIN D3) 25 MCG (1,000 UNIT) TABLET
ORAL_TABLET | Freq: Every day | ORAL | 0 refills | 30 days | Status: CP
Start: 2021-08-14 — End: ?

## 2021-08-14 MED ADMIN — insulin glargine (LANTUS) injection 55 Units: 55 [IU] | SUBCUTANEOUS | @ 14:00:00

## 2021-08-14 MED ADMIN — finasteride (PROSCAR) tablet 5 mg: 5 mg | ORAL | @ 14:00:00

## 2021-08-14 MED ADMIN — insulin glargine (LANTUS) injection 55 Units: 55 [IU] | SUBCUTANEOUS | @ 04:00:00

## 2021-08-14 MED ADMIN — menthol (COUGH DROPS) lozenge 1 lozenge: 1 | ORAL | @ 14:00:00

## 2021-08-14 MED ADMIN — oxymetazoline (AFRIN) 0.05 % nasal spray 3 spray: 3 | NASAL | @ 02:00:00 | Stop: 2021-08-15

## 2021-08-14 MED ADMIN — insulin lispro (HumaLOG) injection 0-20 Units: 0-20 [IU] | SUBCUTANEOUS | @ 12:00:00

## 2021-08-14 MED ADMIN — lisinopriL (PRINIVIL,ZESTRIL) tablet 40 mg: 40 mg | ORAL | @ 14:00:00

## 2021-08-14 MED ADMIN — levothyroxine (SYNTHROID) tablet 50 mcg: 50 ug | ORAL | @ 10:00:00

## 2021-08-14 MED ADMIN — insulin lispro (HumaLOG) injection 25 Units: 25 [IU] | SUBCUTANEOUS | @ 17:00:00

## 2021-08-14 MED ADMIN — cholecalciferol (vitamin D3 25 mcg (1,000 units)) tablet 25 mcg: 25 ug | ORAL | @ 14:00:00

## 2021-08-14 MED ADMIN — enoxaparin (LOVENOX) syringe 40 mg: 40 mg | SUBCUTANEOUS | @ 02:00:00

## 2021-08-14 MED ADMIN — empagliflozin (JARDIANCE) tablet 10 mg: 10 mg | ORAL | @ 17:00:00 | Stop: 2021-08-14

## 2021-08-14 MED ADMIN — insulin lispro (HumaLOG) injection 0-20 Units: 0-20 [IU] | SUBCUTANEOUS | @ 17:00:00

## 2021-08-14 MED ADMIN — clindamycin (CLEOCIN) capsule 300 mg: 300 mg | ORAL | @ 02:00:00 | Stop: 2021-08-22

## 2021-08-14 MED ADMIN — guaiFENesin (ROBITUSSIN) oral syrup: 100 mg | ORAL | @ 17:00:00

## 2021-08-14 MED ADMIN — insulin lispro (HumaLOG) injection 25 Units: 25 [IU] | SUBCUTANEOUS | @ 23:00:00

## 2021-08-14 MED ADMIN — nystatin (MYCOSTATIN) powder 1 application: 1 | TOPICAL | @ 02:00:00

## 2021-08-14 MED ADMIN — guaiFENesin (ROBITUSSIN) oral syrup: 100 mg | ORAL | @ 10:00:00

## 2021-08-14 MED ADMIN — aspirin chewable tablet 81 mg: 81 mg | ORAL | @ 14:00:00

## 2021-08-14 MED ADMIN — menthol (COUGH DROPS) lozenge 1 lozenge: 1 | ORAL | @ 02:00:00

## 2021-08-14 MED ADMIN — pantoprazole (PROTONIX) EC tablet 40 mg: 40 mg | ORAL | @ 14:00:00

## 2021-08-14 MED ADMIN — menthol (COUGH DROPS) lozenge 1 lozenge: 1 | ORAL | @ 04:00:00

## 2021-08-14 MED ADMIN — guaiFENesin (ROBITUSSIN) oral syrup: 100 mg | ORAL | @ 04:00:00

## 2021-08-14 MED ADMIN — guaiFENesin (ROBITUSSIN) oral syrup: 100 mg | ORAL | @ 13:00:00

## 2021-08-14 MED ADMIN — cetirizine (ZyrTEC) tablet 10 mg: 10 mg | ORAL | @ 14:00:00

## 2021-08-14 MED ADMIN — clindamycin (CLEOCIN) capsule 300 mg: 300 mg | ORAL | @ 14:00:00 | Stop: 2021-08-22

## 2021-08-14 MED ADMIN — insulin lispro (HumaLOG) injection 25 Units: 25 [IU] | SUBCUTANEOUS | @ 13:00:00

## 2021-08-14 MED ADMIN — insulin lispro (HumaLOG) injection 0-20 Units: 0-20 [IU] | SUBCUTANEOUS | @ 04:00:00

## 2021-08-14 MED ADMIN — guaiFENesin (ROBITUSSIN) oral syrup: 100 mg | ORAL | @ 02:00:00

## 2021-08-14 MED ADMIN — fenofibrate (TRICOR) tablet 48 mg: 48 mg | ORAL | @ 14:00:00

## 2021-08-14 MED ADMIN — empagliflozin (JARDIANCE) tablet 10 mg: 10 mg | ORAL | @ 14:00:00 | Stop: 2021-08-14

## 2021-08-14 MED ADMIN — atorvastatin (LIPITOR) tablet 20 mg: 20 mg | ORAL | @ 14:00:00

## 2021-08-14 MED ADMIN — guaiFENesin (ROBITUSSIN) oral syrup: 100 mg | ORAL | @ 23:00:00

## 2021-08-14 MED ADMIN — nystatin (MYCOSTATIN) powder 1 application: 1 | TOPICAL | @ 14:00:00

## 2021-08-14 MED ADMIN — oxymetazoline (AFRIN) 0.05 % nasal spray 3 spray: 3 | NASAL | @ 14:00:00 | Stop: 2021-08-16

## 2021-08-14 MED ADMIN — enoxaparin (LOVENOX) syringe 40 mg: 40 mg | SUBCUTANEOUS | @ 14:00:00

## 2021-08-14 NOTE — Unmapped (Signed)
Physical Medicine and Rehabilitation  Daily Progress Note [facility]    ASSESSMENT:   Summer Russell is a 45 y.o. female with PMHx of HTN, HLD, GERD, uncontrolled type 2 diabetes, polyneuropathy, hypothyroidism, psoriasis complicated by arthritis (on long-standing infliximab infusions), fibromyalgia, and sleep apnea??admitted to [facility] 12/20 for severe hyperglycemia and lactic acidosis and nephrotic range proteinuria along with worsening left sided weakness, urinary retention and saddle anesthesia.??  ??  Impairment Group Code: (Neurologic Conditions) 03.3 Polyneuropathy  PLAN:     REHAB:   - PT and OT to maximize functional status with mobility and ADLs as well as prevention of joint contracture   - RT for community re-integration, relaxation, and Support Group  - Discussed at next interdisciplinary team conference    ??  Lower extremity weakness/Sensory deficits: MRI brain negative (12/24). Patient unable to tolerate close spaces so spine MRI not obtainable. Patient may have lumbosacral plexopathy (such as diabetic amyotrophy vs polyneuropathy) in setting of known neuropathy, so EMG/NCS could be considered, however due to body habitus, may be more challenging to perform. Deferred EMG inpatient as symptoms improved with better glucose control. Initially with concern for urinary retention but passed TOV and continent of bladder since.   [ ]  Plan for outpatient referral to neurology to consider outpatient EMG/NCS  DVT Prophylaxis:  - Lovenox 40 mg BID (weight based)  Pain:  - Tylenol 1000 mg TID prn  ??  Hyperglycemia - Uncontrolled T2DM with insulin dependence: BG elevated to >400 during acute hospitalization (no AG or acidemia to suggest DKA). Diabetes education and FM consulted for insulin titration/comangement, appreciate recs. Continues to be hyperglycemic with fasting and throughout the day.   - Increased Jardiance to 25mg  daily??per FM reccs   -??Continue Lantus 55U BID (increased 1/7)  - Continue Humalog 25U TIDAC (increased 1/6)  - resistant SSI  - Holding metformin given CKD  [x]  Outpt appt scheduled with [facility] endocrinology    RSV: Pt endorsed cough, intermittently productive, sore throat, generalized malaise, and intermittent lightheadedness. Per pt's spouse, he had similar symptoms during pt's acute admission, since resolved. Rapid respiratory PCR panel from 1/3 positive for RSV. Symptoms managed with prns appropriately. Continues to endorse nasal/sinus congestion and generalized fatigue. Afebrile, O2 sats wnl and other vitals stable.   - Continue Droplet and isolation precautions; continue until pt asymptomatic and at baseline respiratory status   - Continue scheduled guaifenesin 100mg  QID (start 1/5)   - Continue Afrin nasal spray BID x 5 days   - Continue Flonase daily prn   - Symptomatic care prn   ??  CKD - Nephrotic range proteinuria - B/l LE Edema - Lymphedema: Nephrology consulted during acute hospitalization due to AKI, proteinuria and elevated lactate. 12/20 Echo w/ normal EF. 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 Recommended for outpt kidney biopsy to further evaluate. Pt has been hypoalbuminemic and low total prtn suggesting b/l LE edema has a component of 3rd spacing 2/2 low oncotic pressures and known hx of lymphedema. Pt has a lymph edema pump she utilizes at home to help mobilize LE edema. sCr resolved to within baseline, currently 1.11. In anticipation of d/c, outpt plan discussed with FM who agree with continuing daily Bumex though pt will need outpt titration of Bumex regimen depending on renal labs.   - Increase empagliflozin 25mg  daily, as above   - HOLD home OCP (ethinyl estradiol/drospirenone), given hypercoagulability associated with nephrotic syndrome per pharmacy  recommendation.   - Continue Bumex 1mg  to daily (decreased 1/6)  [  ] Outpt PCP to titrate diuretic regimen as appropriate   - Daily standing weights  - Strict I&O  [x]  Rescheduled outpatient nephrology follow-up appointment   [ ]  plan to coordinate outpatient kidney biopsy with [facility]IR prior to discharge  ??  Hydradenitis suppurativa - Psoriasis: Managed by dermatology outpatient; on IV Renflexis, finasteride, and clindamycin.??S/p Renflexis infusion 12/19.   - Continue home clindamycin 300mg  BID   - Continue Finasteride 5mg  daily    Chronic Problems:   Vertigo/nausea: zyrtec 10 mg daily, compazine prn (first line), Zofran prn (second line), meclizine TID prn   Hypothyroidism: Continue home Synthroid 50 mcg every day + 25 mcg T/Th  HTN: Lisinopril and Bumex as above   HLD: Continue home Lipitor 20 mg daily, fenofibrate 48 mg daily, ASA 81 mg daily  Sleep Apnea: Does not tolerate CPAP at bedtime  GERD: Continue home protonix 40 mg daily  Rhinitis - Cough:  lozenges, robitussin, flonase prn  ??  DISPO: Admitted to Rehab floor  EDD: 1/10  Follow up: PCP, neurology referral, nephrology, endocrinology referral     SUBJECTIVE:     Interval Events: NAEON. VSS. Pt subjectively feels slightly better regarding RSV infection though continues to endorse nasal/sinus congestion and emesis 2/2 coughing episode x1 this morning. Continues to use prn medications for symptom management appropriately. In anticipation of discharge, discussed medication plan to be continued in the outpt setting. Given limited transportation, will provide OTC medications with discharge for pt's ease.     OBJECTIVE:     Vital signs (last 24 hours):  Temp:  [36.6 ??C (97.9 ??F)-36.7 ??C (98.1 ??F)] 36.6 ??C (97.9 ??F)  Heart Rate:  [89-100] 89  Resp:  [18] 18  BP: (135-144)/(64-80) 136/64  MAP (mmHg):  [85-87] 87  SpO2:  [98 %-100 %] 100 %    Intake/Output (last 3 shifts):  I/O last 3 completed shifts:  In: 1638.7 [P.O.:1618.7; I.V.:20]  Out: 2425 [Urine:2425]    Physical Exam:  GEN: NAD, sitting upright on BSC with therapy   EYES: sclera anicteric, conjunctiva clear   HENT: NCAT  NECK: trachea midline  CV: warm and well perfused extremities   RESP: NWOB on RA, without accessory muscle use   SKIN: no visible rashes or skin breakdown. Inc b/l LE edema with associated skin tautness.    MSK: no notable contractures or spasticity.   NEURO: Awake and alert, answers questions appropriately    Medications:  Scheduled   ??? aspirin chewable tablet 81 mg Daily   ??? atorvastatin (LIPITOR) tablet 20 mg Daily   ??? cetirizine (ZyrTEC) tablet 10 mg Daily   ??? cholecalciferol (vitamin D3 25 mcg (1,000 units)) tablet 25 mcg Daily   ??? clindamycin (CLEOCIN) capsule 300 mg BID   ??? diclofenac sodium (VOLTAREN) 1 % gel 2 g QID   ??? empagliflozin (JARDIANCE) tablet 10 mg Daily   ??? enoxaparin (LOVENOX) syringe 40 mg Q12H   ??? fenofibrate (TRICOR) tablet 48 mg Daily   ??? finasteride (PROSCAR) tablet 5 mg Daily   ??? guaiFENesin (ROBITUSSIN) oral syrup Q4H   ??? insulin glargine (LANTUS) injection 55 Units BID   ??? insulin lispro (HumaLOG) injection 0-20 Units ACHS   ??? insulin lispro (HumaLOG) injection 25 Units TID AC   ??? levothyroxine (SYNTHROID) tablet 50 mcg Once per day on Sun Mon Wed Fri Sat   ??? levothyroxine (SYNTHROID) tablet 75 mcg Once per day on Tue  Thu   ??? lisinopriL (PRINIVIL,ZESTRIL) tablet 40 mg Daily   ??? nystatin (MYCOSTATIN) powder 1 application BID   ??? oxymetazoline (AFRIN) 0.05 % nasal spray 3 spray BID   ??? pantoprazole (PROTONIX) EC tablet 40 mg Daily     PRN acetaminophen, 1,000 mg, Q8H PRN  bismuth subsalicylate, 30 mL, Q6H PRN  calcium carbonate, 200 mg of elem calcium, TID PRN  dextrose, 12.5 g, Q15 Min PRN  fluticasone propionate, 1 spray, Daily PRN  glucagon, 1 mg, Once PRN  glucose, 16 g, Q10 Min PRN  hydrocortisone, 25 mg, Daily PRN  hydrOXYzine, 25 mg, Q6H PRN  meclizine, 25 mg, TID PRN  cough/sore throat lozenge, 1 lozenge, Q2H PRN  prochlorperazine, 5 mg, Q6H PRN   And  ondansetron, 4 mg, BID PRN  polyethylene glycol, 17 g, Daily PRN  simethicone, 80 mg, TID PRN  sodium chloride, 1 spray, Q6H PRN  traZODone, 50 mg, Nightly PRN      Labs/Studies: Reviewed.    Radiology Results: Reviewed.

## 2021-08-14 NOTE — Unmapped (Signed)
[facility] Physical Medicine and Rehab  Discharge Summary    Patient Name: Summer Russell       Medical Record Number: [id number]   Date of Birth: 1976/02/14  Sex: Female          Room/Bed: 3H206/3H206-01  Payor Info: Payor: [place] [employer] [place] / Plan: [place] [employer] [place] / Product Type: *No Product type* /      Admit Date: 08/03/2021   Discharge Date: 08/15/21  Admitting Physician: Darrick Grinder, MD  Discharge Physician: Sena Hitch, M.D.  Rehab Impairment Group Code [facility]): (Neurologic Conditions) 03.3 Polyneuropath; Etiology:    Admission Functional Status: Discharge Functional Status:   Pre-Admission/Current Level of Function:    ADLs: Needs assistance with ADLs  ??  ADLs - Needs Assistance: Feeding; Grooming; Bathing; Toileting; UB dressing; LB dressing  ??  Feeding - Needs Assistance: Set Up Assist; Performed at bed level  ??  Grooming - Needs Assistance: Performed at Bed level; Set Up Assist  ??  Bathing - Needs Assistance: Mod assist; Max assist  ??  Toileting - Needs Assistance: Mod assist; Max assist  ??  UB Dressing - Needs Assistance: Min assist; Performed at bed level  ??  LB Dressing - Needs Assistance: Mod assist; Max assist  ??  ??  Mobility:??  Bed Mobility: supine to sit with SBA, sit to supine with CGA, increased time for progressing LEs  ??  Transfers: sit<>stand with CGA and RW x3 trials  ??  Gait: All performed with CGA and bariatric RW: side-stepping at EOB x2 ft, seated rest break, stepping in place x12 steps, seated rest break, stepping in place x12 steps  ??  ??  Cognition/Swallow/Speech:  Patient's Vision Adequate to Safely Complete Daily Activities: Yes  ??  Patient's Judgement Adequate to Safely Complete Daily Activities: Yes  ??  Patient's Memory Adequate to Safely Complete Daily Activities: Yes  ??  Patient Able to Express Needs/Desires: Yes  ??  Patient has speech problem: No PHYSICAL THERAPY   Discharge Summary: No barriers to DC.  We did not get to practice ramp recently because she has been in isolation for rsv.    OCCUPATIONAL THERAPY  Discharge Summary: Pt is a 46 y.o. F PMHx of HTN, HLD, GERD, uncontrolled T2DM, polyneuropathy, hypothyroidism, psoriasis c/b arthritis (on long-standing infliximab infusions), fibromyalgia, and sleep apnea admitted to [facility] 12/20 for severe hyperglycemia and lactic acidosis and nephrotic range proteinuria along with worsening left sided weakness, urinary retention and saddle anesthesia.  Pt has made good progress with AIR OT and is now S for transfers, s/u for UBD, bathing and grooming and Min A for LBD.  Therapist recommending discharge home with no new DME needs and no follow up OT services.    SPEECH LANGUAGE PATHOLOGY       Assistive devices:     Indication for Admission / HPI:     Summer Russell is a 46 y.o. female  with past medical history of uncontrolled type 2 diabetes, polyneuropathy, hypothyroidism, psoriasis complicated by arthritis, fibromyalgia, and sleep apnea admitted to [facility] 12/20 for severe hyperglycemia and lactic acidosis and nephrotic range proteinuria along with worsening left sided weakness, urinary retention and saddle anesthesia. MRI brain negative and suspect element of DM neuropathy.     Hospital Course:     REHAB admission: While admitted to AIR patient was provided the below services: PT and OT maximized functional status with mobility and ADLs as  well as prevention of joint contracture. Recreational therapy for community re-integration, relaxation, and support group. Was discussed at interdisciplinary team conferences weekly. ??    Lower extremity weakness/Sensory deficits: MRI brain negative (12/24). Patient was unable to tolerate close spaces so spine MRI was not obtainable prior to AIR admission. Patient may have lumbosacral plexopathy (such as diabetic amyotrophy vs polyneuropathy) in setting of known neuropathy, so EMG/NCS could be considered. However may be challenging to perform 2/2 body habitus. EMG inpatient was deferred as symptoms improved with improved glucose control. Initially there were concerns for urinary retention but she subsequently passed TOV and was continent of bladder throughout AIR admission.   Pain was managed with:   Tylenol 1000 mg TID prn  [ ]  Referral to neurology placed on AIR discharge to consider outpatient EMG/NCS  ??  Hyperglycemia - Uncontrolled T2DM with insulin dependence: Hgb A1c 10.9% on 07/25/21. BG elevated to >400 during acute hospitalization (no AG or acidemia to suggest DKA). Diabetes education and FM were consulted during AIR admission for insulin titration/comangement, appreciate recs. Continued to be hyperglycemic with fasting BGs and throughout the day with below regimen but much improved compared to acute admission.   - PA obtained; Continue Jardiance 25mg  daily??(approval 08/14/21 - 2024)   -??Continue Lantus 55U BID  - Continue Humalog 25U TIDAC   - Continue Gabapentin 300mg  BID, prev home regimen   - Held metformin given CKD   - Outpt appt scheduled with [facility] endocrinology, 10/13/21   Below are final recommendations from the Family Medicine team:   1. Continue current regimen of Jardiance 25 mg daily, Insulin Lispro 25 units with meals, Lantus 55 units BID. Suggest applying for Pharmacy assistance program if finances are a concern.   2. Patient should continue 4 times a day blood sugar checks and log them for her PCP to revisit. This means patient will need formal diabetes education prior to discharge and should discharge with Rx of Glucometer, appropriate strips for blood sugar checks, lancets, alcohol swabs.   3. If able, Summer Russell would be an excellent candidate for continuous blood sugar monitoring devices such as freestyle libre. Would recommend follow up for this on discharge  4. PCP to complete foot exam, retinopathy screening.  5. Outpatient consider addition of GLP1-agonist for glycemic control and weight loss (patient would prefer daily vs once weekly injections given difficulties in adherence to once weekly previously)  ??  CKD - Nephrotic range proteinuria - B/l LE Edema - Lymphedema: Nephrology was consulted during acute hospitalization due to AKI, proteinuria and elevated lactate. 12/20 Echo w/ normal EF. 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 Recommended for outpt kidney biopsy to further evaluate. Pt had been hypoalbuminemic with low total prtn suggesting b/l LE edema has a component of 3rd spacing 2/2 low oncotic pressures, also with known hx of lymphedema. Pt has a lymph edema pump she utilizes at home to help mobilize LE edema. Initially, pt was uptitrated to home diuresis regimen of Bumex 1mg  BID with appropriate urine output and subjectively decreased b/l LE edema. However, Bumex was down-titrated to 1mg  daily and briefly discontinued 2/2 sCr bump. sCr resolved to within baseline, prior to AIR discharge it was 1.12. In anticipation of d/c, outpt diuresis plan discussed with FM who agreed with continuing MWF Bumex (home regimen was BID). Pt may need outpt titration of Bumex regimen depending on renal and electrolyte labs.   - Continue empagliflozin  25mg  daily, as above   - HOLD home OCP (ethinyl estradiol/drospirenone), given hypercoagulability associated with nephrotic syndrome per pharmacy recommendation.   - Continue Bumex 1mg  MWF, outpt consider uptitrating as needed   [  ] Outpt PCP to titrate diuretic regimen as appropriate   - Outpt appt scheduled with nephrology, 08/17/21  [ ]  coordinate outpatient kidney biopsy with [facility]   Below are final recommendations per the Family Medicine team:   1. For volume overload, would continue Bumex MWF schedule. She has had some responsiveness to diuretic, but at the expense of a mild increase in creatinine with daily dosing.   2. Both PCP and Nephrology should be involved outpatient in managing her volume overload and Cr.   3. Needs BMP, Mg within 1 week for Cr and electrolytes given diuresis.  4. Outpatient Nephro follow up- will likely need kidney biopsy on outpatient basis.     RSV: Pt endorsed cough, intermittently productive, sore throat, generalized malaise, and intermittent lightheadedness. Per pt's spouse, he had similar symptoms during pt's acute admission, since resolved. Rapid respiratory PCR panel from 1/3 positive for RSV. Symptoms managed with prns appropriately. Was continued on droplet and isolation precautions until AIR discharge. Continued to endorse nasal/sinus congestion and generalized fatigue. Afebrile, O2 sats wnl and other vitals stable. Provided symptomatic care prn.   - Continue guaifenesin 100mg  four times daily as needed   - Continue Flonase daily prn   ??  Hydradenitis suppurativa - Psoriasis: Managed by dermatology outpatient; on IV Renflexis, finasteride, and clindamycin.??S/p Renflexis infusion 12/19. Held patient's Renflexis and Cosentyx throughout acute and AIR admission.   - Continue home clindamycin 300mg  BID   - Continue Finasteride 5mg  daily  - Schedule outpatient appointment with [facility] Dermatology, Dr. Janyth Contes, to restart the held medications, as above    Chronic Problems:   Vertigo/nausea:??zyrtec 10 mg daily  Hypothyroidism: Continue home Synthroid 50 mcg every day + 25 mcg T/Th  HTN: Lisinopril and Bumex as above   HLD: Continue home Lipitor 20 mg daily, fenofibrate 48 mg daily, ASA 81 mg daily  Sleep Apnea: Does not tolerate CPAP at bedtime  GERD: Continue home protonix 40 mg daily  Rhinitis - Cough:  lozenges, robitussin, flonase prn    Consults: Family Medicine     Procedures: None    Discharge Medications:      Your Medication List      STOP taking these medications    COSENTYX PEN (2 PENS) 150 mg/mL Pnij injection  Generic drug: secukinumab     insulin glargine 100 unit/mL injection  Commonly known as: LANTUS  Replaced by: LANTUS SOLOSTAR U-100 INSULIN 100 unit/mL (3 mL) injection pen insulin lispro 100 unit/mL injection  Commonly known as: HumaLOG  Replaced by: HumaLOG KwikPen Insulin 100 unit/mL injection pen     pen needle, diabetic 31 gauge x 5/16 (8 mm) Ndle  Replaced by: BD ULTRA-FINE NANO PEN NEEDLE 32 gauge x 5/32 (4 mm) Ndle        START taking these medications    BD ULTRA-FINE NANO PEN NEEDLE 32 gauge x 5/32 (4 mm) Ndle  Generic drug: pen needle, diabetic  Use with insulin up to 4 times/day as needed.  Replaces: pen needle, diabetic 31 gauge x 5/16 (8 mm) Ndle     bumetanide 1 MG tablet  Commonly known as: BUMEX  Take 1 tablet (1 mg total) by mouth Every Monday, Wednesday, and Friday.     fluticasone propionate 50 mcg/actuation nasal spray  Commonly  known as: FLONASE  Use 1 spray into each nostril daily as needed for rhinitis.     guaiFENesin 100 mg/5 mL syrup  Commonly known as: ROBITUSSIN  Take 5 mL (100 mg total) by mouth four (4) times a day as needed for cough.     HumaLOG KwikPen Insulin 100 unit/mL injection pen  Generic drug: insulin lispro  Inject 25 Units under the skin Three (3) times a day before meals.  Replaces: insulin lispro 100 unit/mL injection     hydrOXYzine 25 MG tablet  Commonly known as: ATARAX  Take 1 tablet (25 mg total) by mouth two (2) times a day as needed for itching or anxiety.     lancets Misc  Use to check blood sugar as directed with insulin 3 times a day & for symptoms of high or low blood sugar.     LANTUS SOLOSTAR U-100 INSULIN 100 unit/mL (3 mL) injection pen  Generic drug: insulin glargine  Inject 0.55 mL (55 Units total) under the skin two (2) times a day.  Replaces: insulin glargine 100 unit/mL injection     nystatin 100,000 unit/gram powder  Commonly known as: MYCOSTATIN  Apply 1 application topically Two (2) times a day.     ondansetron 4 MG tablet  Commonly known as: ZOFRAN  Take 1 tablet (4 mg total) by mouth daily as needed for up to 15 days.        CHANGE how you take these medications    ACCU-CHEK GUIDE TEST STRIPS Strp  Generic drug: blood sugar diagnostic  Use to check blood sugar as directed with insulin 3 times a day & for symptoms of high or low blood sugar.  What changed: additional instructions     blood-glucose meter Misc  USE AS INSTRUCTED  What changed: See the new instructions.     cholecalciferol (vitamin D3 25 mcg (1,000 units)) 1,000 unit (25 mcg) tablet  Take 1 tablet (25 mcg total) by mouth daily.  What changed: when to take this     clindamycin 300 MG capsule  Commonly known as: CLEOCIN  Take 1 capsule (300 mg total) by mouth Two (2) times a day.  What changed: when to take this     gabapentin 300 MG capsule  Commonly known as: NEURONTIN  Take 1 capsule (300 mg total) by mouth two (2) times a day.  What changed: when to take this     insulin syringe-needle U-100 1 mL 31 gauge x 5/16 (8 mm) Syrg  Use with insulin up to 4 times per day as needed.  What changed: See the new instructions.     JARDIANCE 25 mg tablet  Generic drug: empagliflozin  Take 1 tablet (25 mg total) by mouth daily.  What changed:   medication strength  how much to take     levothyroxine 75 MCG tablet  Commonly known as: SYNTHROID  Take one tablet by mouth every Tuesday and Thursday.  What changed: You were already taking a medication with the same name, and this prescription was added. Make sure you understand how and when to take each.     levothyroxine 50 MCG tablet  Commonly known as: SYNTHROID  Take one tablet by mouth every: Sun, Mon, Wed, Fri, Sat  What changed: See the new instructions.     pantoprazole 40 MG tablet  Commonly known as: PROTONIX  Take 1 tablet (40 mg total) by mouth daily.  What changed: when to take this  CONTINUE taking these medications    ascorbic Acid 500 mg Cper  Commonly known as: VITAMIN C  Take 500 mg by mouth.     aspirin 81 MG tablet  Commonly known as: ECOTRIN  Take 1 tablet (81 mg total) by mouth daily.     atorvastatin 20 MG tablet  Commonly known as: LIPITOR  Take 1 tablet (20 mg total) by mouth daily. cetirizine 10 MG tablet  Commonly known as: ZyrTEC  Take 1 tablet (10 mg total) by mouth daily.     empty container Misc  Use as directed to dispose of Cosentyx pens.     fenofibrate 48 MG tablet  Commonly known as: TRICOR  Take 1 tablet (48 mg total) by mouth daily.     finasteride 5 mg tablet  Commonly known as: PROSCAR  Take 1 tablet (5 mg total) by mouth daily.     fluconazole 150 MG tablet  Commonly known as: DIFLUCAN  TAKE 1 TAB NOW AN THEN TAKE 1 TABLET ONCE WEEKLY AS NEEDED FOR YEAST INFECTION     lidocaine 5 % ointment  Commonly known as: XYLOCAINE  Apply to painful areas every 3-4 hours as needed     lisinopriL 40 MG tablet  Commonly known as: PRINIVIL,ZESTRIL  Take 1 tablet (40 mg total) by mouth daily.     multivitamin capsule  Take 1 capsule by mouth daily.          Significant Diagnostic Studies: Reviewed in Epic      Discharge Instructions:     Medications:  Please take all medications as prescribed below and note any changes.    Other Instructions and Information:   Follow Up instructions and Outpatient Referrals     Ambulatory referral to Home Health      Is this a [facility] or [facility] Patient?: Yes    Do you want agency provider parameter notifications or patient specific   provider parameter notifications?: Agency    Do you want to initiate remote patient monitoring?: No    Physician to follow patient's care: PCP    Disciplines requested:  Nursing  Physical Therapy  Occupational Therapy       Nursing requested: Other: (please enter in comments) Comment - med and   disease management    Physical Therapy requested: Evaluate and treat    Occupational Therapy Requested: Evaluate and treat    Requested SOC Date: 08/17/2021    Do you want ongoing co-management?: No    Care coordination required?: No    Ambulatory referral to Neurology      Discharge instructions        Activity Instructions     Activity as tolerated            Follow-Up Appointments:   Please make all follow-up appointments as noted below. If not already scheduled, please set-up an appointment with a Primary Care Provider for continued general medical care.  Other Instructions     Discharge instructions      You were hospitalized at [facility] after debility/weakness secondary to polyneuropathy related to uncontrolled Type 2 Diabetes. It has been a pleasure caring for you and you have made excellent progress during your rehab stay.     MEDICATIONS:  Please take all your medications as prescribed and note any changes as listed.     New Important Medications:   - Diabetes regimen: this is the regimen you were on prior to discharge from [facility] AIR.  Please continue this regimen unless instructed otherwise by your healthcare provider in the outpatient setting. New 30 days of insulin supplies have been ordered for you.   1) Insulin Glargine (Lantus) - take 55U twice daily. Previously took 60U once every night.      2) Insulin Lispro (Humalog) - take 25U with meals three times per day. Previously took 10U with meals.     - Hidradenitis Suppurativa/Psoriasis regimen: The below medications have been HELD because you had not been receiving them during this hospital course. Please discuss these medications with your [facility] Dermatologist, Dr. Janyth Contes, prior to restarting them.   3) Remicade infusions   4) Secukinumab (Cosentyx)     - Hypothyroidism: this is the regimen you were on prior to discharge from [facility] AIR. Please continue this regimen unless instructed otherwise by your healthcare provider in the outpatient setting.   5) Levothyroxine (Synthroid) - Take 1 tablet by mouth every: Sun, Mon, Wed, Fri, Sat.   6) Levothyroxine (Synthroid) - Take 1 tablet by mouth every: Tues and Thurs.     - RSV: this viral respiratory infection is managed symptomatically, you have been provided the below medications to help with symptoms.    7) Ondansteron (Zofran) - you have been provided with a small supply of this medication for anti-nausea. Take this medication as needed in this regard.    8) Fluticasone propionate (Flonase) - this is a nasal spray that can be used to help with sinus/nasal congestion.  Take this medication as needed in this regard.  9) Guaifenesin (Robitussin) -used to suppress cough, takes this medication as needed in this regard.     - Lower Extremity Edema: Discuss this regimen with your outpatient nephrologist and PCP as they will continue to monitor and treat your volume status.   10) Budesonide (Bumex) 1mg  - take 1 tablet by mouth every Mon, Wed, Fri.     - Anxiety/Itching  11) Hydroxyzine (Atarax) - you have been provided with a small supply of this medication for anxiety/itching. Take this medication as needed in this regard.      - Too early to refill: These medications cannot be refilled until 08/19/2021.  If you run out of these medications, please call your home pharmacy and request the prescriptions to be transferred over to be filled after this date.  12) finasteride (Proscar)  13) pantoprazole (Protonix)    Below we have final recommendations per the [facility] Medicine team:??  1. Continue current regimen of Jardiance 25 mg daily, Insulin Lispro 25 units with meals, Lantus 55 units BID. Suggest applying for Pharmacy assistance program if finances are a concern.   2. Patient should continue 4 times a day blood sugar checks and log them for her PCP to revisit. This means patient will need formal diabetes education prior to discharge and should discharge with Rx of Glucometer, appropriate strips for blood sugar checks, lancets, alcohol swabs.   3. If able, Summer Lusine Corlett would be an excellent candidate for continuous blood sugar monitoring devices such as freestyle libre. Would recommend follow up for this on discharge  4. PCP to complete foot exam, retinopathy screening.  5. For volume overload, would continue Bumex MWF schedule. She has had some responsiveness to diuretic, but at the expense of a mild increase in creatinine with daily dosing.   6. Both PCP and Nephrology should be involved outpatient in managing her volume overload and Cr.   7. Needs BMP, Mg within 1 week for  Cr and electrolytes given diuresis.  8. Compression stockings, as able, daily for lymphedema  9. Outpatient Nephro follow up- will likely need kidney biopsy on outpatient basis.   10. Consider addition of GLP1-agonist inpatient vs outpatient for glycemic control and weight loss (patient would prefer daily vs once weekly injections given difficulties in adherence to once weekly previously)  _________________________________________________________________________________    FOLLOW UP APPOINTMENTS:  You will have follow-up visits with the following clinics/providers. If you do not hear from these clinics within a week from discharge, please call the numbers provided below.     1) Please follow up on 08/17/21 at 1:00PM with your [facility] Nephrologist, Dr. Einar Gip, at [facility] KIDNEY SPECIALTY AND [facility]ANSPLANT CLINIC [facility].     2) Please follow up on 10/13/21 at 3:00PM with your [facility] Endocrinologist, Dr. Darius Bump, at [facility] DIABETES AND ENDOCRINOLOGY [facility].     3) A referral has sent to [facility] neurology for further testing of your nerve functionality. They will determine whether this appropriate, please be aware they may contact you in regards to scheduling this appointment.     4) As discussed, please schedule a hospital follow-up appointment with your PCP for further evaluation and care of your health conditions.    5) Consider scheduling a follow-up appointment with your [facility] dermatologist, Dr. Janyth Contes, for continued management of your hidradenitis and psoriasis.   _______________________________________________________________________________    DIET: Regular/Consistent Carb; Sodium restricted <2g per day      ACTIVITY: As tolerated  Recovery takes several months, especially if you are elderly or have another illness. Your body uses a lot of energy recovering and needs time and rest. Gradually increase your activity taking rest periods as needed to ensure that you are being safe.     WHEN TO CALL YOUR PHYSICIAN:  Following discharge from the hospital, please call 911 immediately and go to the nearest Emergency Department if you notice:  - Temperature greater than 101.47F or chills  - Pain not controlled with prescribed medications  - Uncontrolled nausea or vomiting  - Worsening or persistent abdominal pain  - Inability to pass stool for 3 days or more   - Severe or worsening headache or acute changes in vision  - Weakness or loss of sensation in your arms or legs  - Chest pain  - Shortness of breath  - Wound changes including spreading redness, thick yellow drainage, severe bleeding, or separation of your wounds    IMPORTANT NUMBERS:  If you develop these symptoms or if you have trouble obtaining any of your medications, you may also call the [facility] Rehabilitation Clinic at [phone] as needed.    After Hours, Weekend, or Holidays:  It's after 5:00pm during the week or it's the weekend. How do I get medical attention?  Call the [facility] 24/7 Nursing Line [phone] to get nurse advice.  Go to [facility] Urgent Care walk-in clinic at [address], [place], [place]; [phone]; 7 days a week from 9:00AM - 8:00PM.    Financial Counselor: [phone]        Appointments which have been scheduled for you    Aug 17, 2021  1:00 PM  (Arrive by 12:45 PM)  RETURN  GENERAL with Abhijit Lonia Blood, MD  [facility] KIDNEY SPECIALTY [facility]ANSPLANT CLINIC [place] [facility]) [address]  [place] [address]  [phone]      Aug 23, 2021  1:00 PM  (  Arrive by 12:45 PM)  INFUSION- ACC REMICADE60 UNCTF with UNCTIF 13, UNCTIF RN 1  [facility] [facility] THERAPEUTIC INFUSION CTR [place] [facility] REGION) [address]  [place] [address]  [phone]      Aug 31, 2021 11:00 AM  (Arrive by 10:00 AM)  IR BIOPSY RENAL PERCUTANEOUS with [facility] IR CT 5  IMG VASCULAR INTERVENTIONAL [facility]) [address]  [place] [address]  [phone]   On appt date:  Come with adult to accompany pt home  Bring recent lab work  Bring any meds you take  Take meds w/small sip of water  Check w/physician about current meds  Check w/physician if diabetic  Check w/physician if pt takes blood thinners  Arrive 1 hr early    On appt date do not:  Consume solids after midnight  Consume anything 2 hrs    Let us know if pt:  Pregnant  Allergic to iodine or contrast dyes  Prior arterial or vascular graft operations  History of unstable angina  (Title:IRGEN)     Sep 19, 2021  1:00 PM  (Arrive by 12:45 PM)  INFUSION- ACC REMICADE60 UNCTF with UNCTIF 12, UNCTIF RN 2  [facility] THERAPEUTIC INFUSION CTR [place] [facility] REGION) [address]  [place] [address]  [phone]      Oct 13, 2021  3:00 PM  (Arrive by 2:45 PM)  NEW  DIABETES with Delorse Limber, MD  [facility] DIABETES AND ENDOCRINOLOGY [place] [facility] REGION) [address]  [place] [address]  [phone]      Dec 28, 2021  3:30 PM  (Arrive by 3:15 PM)  NEW NEUROLOGY with Arie Sabina, FNP  [facility] [place] [facility] [place] [facility] REGION) [address]  [place] [address]  [phone]           Discharge Day Services:  The patient was seen and examined on the day of discharge. Vitals signs and exam are stable. Therapy goals met. Discharge medications and instructions were discussed with the patient and family, and all questions were answered.    Time spent for discharge: 30 minutes or greater    Physical Exam:  Vitals:    08/15/21 0555   BP: 150/70   Pulse: 101   Resp: 19   Temp: 36.8 ??C (98.2 ??F)   SpO2: 96%            Physical Exam:  GEN: adult woman, morbidly obese, in NAD.   EYES: Sclera anicteric, conjunctiva clear   HENT: NCAT, MMM, OP clear  NECK: Trachea midline  RESP: CTAB, NWoB on RA  CV: RRR, no m/r/g. Extremities appear warm and well-perfused, 2+ b/l radial and DP pulse.  GI: Abdomen soft, non-tender, non-distended. Normoactive bowel sounds.   SKIN: No visible masses, lesions, rashes, ecchymoses, or lacerations. B/l LE non-pitting edema up to mid thigh with venous stasis skin changes  MSK: FROM in BUE and BLE, no notable contractures, no visible erythema over joints.  NEURO:   Mental Status: A&Ox3, attention intact, short and long term memory intact, speech fluid and coherent, follows commands, and answers questions appropriately   Cranial Nerve: 2-12 grossly intact    Sensory: BUE and BLE sensation intact to light touch   Motor:   - RUE: Shoulder Abduction 5/5, EF 5/5, EE 5/5, WE 5/5, HG 5/5  - LUE:  Shoulder Abduction 5/5, EF 5/5, EE 5/5, WE 5/5, HG 5/5  - RLE: HF 5/5, KE 5/5, KF 5/5, DF 5/5, PF 5/5.  - LLE: HF 5/5, KE 5/5, KF 5/5, DF 5/5, PF 5/5.   Tone: WNL, no spasticity noted   Cerebellar: no abnormal or extraneous movements   Gait: Deferred    PSYCH: mood euthymic, affect appropriate, thought process logical   Discharge Condition: Stable    Discharge Disposition: home with family assistance/supervision    Home Health: Nursing, Physical Therapy and Occupational Therapy    Outpatient Therapy: None     Sania Noy, DO   PGY2 [place] [facility]

## 2021-08-14 NOTE — Unmapped (Signed)
A & O x 4 ,continent B & B ,droplet & contact precautions maintained ,ACHS,safety & fall precautions maintained ,port cath intact on right chest wall ,bed low  locked ,call bell & phone within reach ,will continue to monitor.  Problem: Rehabilitation (IRF) Plan of Care  Goal: Plan of Care Review  Outcome: Progressing  Goal: Patient-Specific Goal (Individualized)  Outcome: Progressing  Goal: Absence of New-Onset Illness or Injury  Outcome: Progressing  Intervention: Prevent Fall and Fall Injury  Recent Flowsheet Documentation  Taken 08/14/2021 0000 by Lanice Schwab, RN  Safety Interventions: fall reduction program maintained  Taken 08/13/2021 2200 by Lanice Schwab, RN  Safety Interventions: fall reduction program maintained  Taken 08/13/2021 2000 by Lanice Schwab, RN  Safety Interventions: fall reduction program maintained  Intervention: Prevent Infection  Recent Flowsheet Documentation  Taken 08/13/2021 2000 by Lanice Schwab, RN  Infection Prevention:   hand hygiene promoted   single patient room provided  Goal: Optimal Comfort and Wellbeing  Outcome: Progressing  Goal: Home and Community Transition Plan Established  Outcome: Progressing     Problem: Impaired Wound Healing  Goal: Optimal Wound Healing  Outcome: Progressing     Problem: Self-Care Deficit  Goal: Improved Ability to Complete Activities of Daily Living  Outcome: Progressing     Problem: Skin Injury Risk Increased  Goal: Skin Health and Integrity  Outcome: Progressing  Intervention: Optimize Skin Protection  Recent Flowsheet Documentation  Taken 08/14/2021 0000 by Lanice Schwab, RN  Pressure Reduction Techniques: frequent weight shift encouraged  Head of Bed (HOB) Positioning: HOB elevated  Pressure Reduction Devices: positioning supports utilized  Taken 08/13/2021 2200 by Lanice Schwab, RN  Pressure Reduction Techniques: frequent weight shift encouraged  Head of Bed (HOB) Positioning: HOB elevated  Pressure Reduction Devices: positioning supports utilized  Taken 08/13/2021 2000 by Lanice Schwab, RN  Pressure Reduction Techniques: frequent weight shift encouraged  Head of Bed (HOB) Positioning: HOB elevated  Pressure Reduction Devices: positioning supports utilized  Skin Protection: mittens applied to hands     Problem: Hypertension Comorbidity  Goal: Blood Pressure in Desired Range  Outcome: Progressing     Problem: Fluid Volume Excess  Goal: Fluid Balance  Outcome: Progressing  Intervention: Monitor and Manage Hypervolemia  Recent Flowsheet Documentation  Taken 08/13/2021 2000 by Lanice Schwab, RN  Skin Protection: mittens applied to hands     Problem: Infection  Goal: Absence of Infection Signs and Symptoms  Outcome: Progressing  Intervention: Prevent or Manage Infection  Recent Flowsheet Documentation  Taken 08/13/2021 2000 by Lanice Schwab, RN  Infection Management: aseptic technique maintained  Isolation Precautions:   droplet precautions maintained   contact precautions maintained

## 2021-08-14 NOTE — Unmapped (Signed)
**Note De-Identified Tamyah Cutbirth Obfuscation** No falls or injuries this shift. Ambulates with walker and standby assist in room. Requiring guaifenesin q 4 hours for cough. Unable to wear TED hose due to tightness and pain. Skin intact other than scattered bruising. Insulins administered per orders. Will continue to monitor.

## 2021-08-14 NOTE — Unmapped (Signed)
A&O x4. Continent of bladder and bowel. LBM 08/13/21. Droplet and Contact precautions maintained. PRN Lozenge given to assist with cough in addition to scheduled Robitussin. In and Out measurements recorded. Tylenol given for pain, successful. No other pain or discomfort reported. Safety precautions in place and maintained. Hourly rounding performed by nursing staff.       Problem: Rehabilitation (IRF) Plan of Care  Goal: Plan of Care Review  Outcome: Progressing  Goal: Patient-Specific Goal (Individualized)  Outcome: Progressing  Goal: Absence of New-Onset Illness or Injury  Outcome: Progressing  Intervention: Prevent Fall and Fall Injury  Recent Flowsheet Documentation  Taken 08/13/2021 1600 by Fransisco Beau, RN  Safety Interventions: fall reduction program maintained  Taken 08/13/2021 1400 by Fransisco Beau, RN  Safety Interventions: fall reduction program maintained  Taken 08/13/2021 1216 by Fransisco Beau, RN  Safety Interventions: fall reduction program maintained  Taken 08/13/2021 1000 by Fransisco Beau, RN  Safety Interventions: fall reduction program maintained  Taken 08/13/2021 0852 by Fransisco Beau, RN  Safety Interventions: fall reduction program maintained  Goal: Optimal Comfort and Wellbeing  Outcome: Progressing  Goal: Home and Community Transition Plan Established  Outcome: Progressing     Problem: Impaired Wound Healing  Goal: Optimal Wound Healing  Outcome: Progressing     Problem: Self-Care Deficit  Goal: Improved Ability to Complete Activities of Daily Living  Outcome: Progressing     Problem: Skin Injury Risk Increased  Goal: Skin Health and Integrity  Outcome: Progressing  Intervention: Optimize Skin Protection  Recent Flowsheet Documentation  Taken 08/13/2021 1600 by Fransisco Beau, RN  Pressure Reduction Techniques: frequent weight shift encouraged  Taken 08/13/2021 1400 by Fransisco Beau, RN  Pressure Reduction Techniques: frequent weight shift encouraged  Taken 08/13/2021 1216 by Fransisco Beau, RN  Pressure Reduction Techniques: frequent weight shift encouraged  Taken 08/13/2021 1000 by Fransisco Beau, RN  Pressure Reduction Techniques: frequent weight shift encouraged  Taken 08/13/2021 1610 by Fransisco Beau, RN  Pressure Reduction Techniques: frequent weight shift encouraged  Head of Bed (HOB) Positioning: HOB elevated     Problem: Hypertension Comorbidity  Goal: Blood Pressure in Desired Range  Outcome: Progressing     Problem: Fluid Volume Excess  Goal: Fluid Balance  Outcome: Progressing     Problem: Infection  Goal: Absence of Infection Signs and Symptoms  Outcome: Progressing  Intervention: Prevent or Manage Infection  Recent Flowsheet Documentation  Taken 08/13/2021 1600 by Fransisco Beau, RN  Isolation Precautions:   contact precautions maintained   droplet precautions maintained  Taken 08/13/2021 1400 by Fransisco Beau, RN  Isolation Precautions:   contact precautions maintained   droplet precautions maintained  Taken 08/13/2021 1216 by Fransisco Beau, RN  Isolation Precautions:   contact precautions maintained   droplet precautions maintained  Taken 08/13/2021 1000 by Fransisco Beau, RN  Isolation Precautions:   contact precautions maintained   droplet precautions maintained  Taken 08/13/2021 0900 by Fransisco Beau, RN  Isolation Precautions:   contact precautions maintained   droplet precautions maintained  Taken 08/13/2021 0852 by Fransisco Beau, RN  Isolation Precautions:   contact precautions maintained   droplet precautions maintained

## 2021-08-14 NOTE — Unmapped (Signed)
Family Medicine Inpatient Service  Brief Consult Note    ASSESSMENT/ PLAN:   Levonia Russell is a 46 y.o. female with a history of HTN, HLD, GERD, uncontrolled type 2 diabetes, polyneuropathy, hypothyroidism,  psoriasis complicated by arthritis (on long-standing infliximab infusions), fibromyalgia, and sleep apnea admitted to [facility] 12/20 for severe hyperglycemia and lactic acidosis and nephrotic range proteinuria along with worsening left sided weakness, urinary retention and saddle anesthesia who is seen in consultation at the request of PMR colleages for evaluation of insulin management for T2DM.     # CKD I nephrotic range proteinuria I volume overload: Patient has long-standing history of uncontrolled DM, HTN and subsequent CKD w/ albuminuria. 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 Nephrology recommends outpatient renal biopsy and signed off 12/27. Weight improving with diuresis. Recent Echo w/ normal EF.   - Continue bumex 1mg  daily, follow up BMP from 1/7. Would monitor kidney function more closely given uptrending Cr. Will need to establish regimen with PCP that balanced volume status and Cr/Electrolytes.  - Okay to continue ACEi and SGLT2i, increase SGLT-2 today   - Strict intake/output  - Compression stockings  - Daily weights   - Discussed restricting diet, so 2g Na and 2L fluid restricted diet the patient doesn't feel is reasonable especially to do both Na, fluid restriction and consistent carb  - Recommend daily BMP, Mg while diuresing  [ ]  Reschedule outpatient nephrology follow-up appointment (currently scheduled on 08/11/2020)  [ ]  Agree with coordination of outpatient kidney biopsy with VIR prior to discharge unless cr acutely worsened    #Poorly controlled IDDM: continues to have intermittent hyperglycemic episodes above goal at least daily.   - Increase Glargine to 55 U BID  - Continue Lispro 25 with meals and SSI  - Jardiance to 25 mg 1/10, for 1/9 give additional 10 mg (for 20 mg total) to see how CBG change prior to DC on 1/10. We have placed this order  - Dietary changes as above  - Will CTM additional 24-48 hours before next recs.    Family medicine will continue to monitor BG and make recommendations accordingly.     Recommendations:  1. Increase glargine to 55 units BID    2. Continue Lispro to 25 units AC   3. Will CTM blood sugars 24-48 hours given new changes as above  4. Continue Resistant SSI   5. Continue Jardiance as SGLT2i is recommended in diabetic patients with CKD- increase 1/9.  6. Continue ACEi for similar indication.   7. Consider carb restricted diet if unable to achieve consistent euglycemia  8. Re-check A1c ~3 months  9. Agree with endocrinology referral at discharge  10. Agree with stopping Metformin given CKD  11. Consider addition of GLP1-agonist inpatient vs outpatient for glycemic control and weight loss (patient would prefer daily vs once weekly injections given difficulties in adherence to once weekly previously)  12. Agree with diabetes education      Please page Family Medicine Admission Pager [phone]) for questions or further consult needs.     HPI:  Summer Russell is a 46 y.o. female with a history of HTN, HLD, GERD, uncontrolled type 2 diabetes, polyneuropathy, hypothyroidism,  psoriasis complicated by arthritis (on long-standing infliximab infusions), fibromyalgia, and sleep apnea admitted to [facility] following hospitalization with hyperglycemia and new focal deficits.     MEDICATIONS/ ALLERGIES:  No current facility-administered medications on file  prior to encounter.     Current Outpatient Medications on File Prior to Encounter   Medication Sig   ??? ascorbic Acid (VITAMIN C) 500 mg CpER Take 500 mg by mouth.   ??? aspirin (ECOTRIN) 81 MG tablet Take 81 mg by mouth daily.   ??? atorvastatin (LIPITOR) 20 MG tablet Take 20 mg by mouth daily.   ??? BD INSULIN SYRINGE ULTRA-FINE 1 mL 31 gauge x 5/16 (8 mm) Syrg USE 2 (TWO) TIMES DAILY AS DIRECTED   ??? blood sugar diagnostic Strp Frequency:PHARMDIR   Dosage:0.0     Instructions:  Note:Use to test blood sugar in the morning before breakfast and in the evening two hours after dinner, ISD-9 250.00. Dose: 1   ??? blood-glucose meter Misc U QID UTD   ??? cetirizine (ZYRTEC) 10 MG tablet Take 10 mg by mouth daily.   ??? cholecalciferol, vitamin D3, 1,000 unit (25 mcg) tablet Take 1,000 Units by mouth.   ??? clindamycin (CLEOCIN) 300 MG capsule TAKE 1 CAPSULE BY MOUTH TWO TIMES A DAY.   ??? empagliflozin (JARDIANCE) 10 mg tablet Take 1 tablet (10 mg total) by mouth daily.   ??? empty container Misc Use as directed to dispose of Cosentyx pens.   ??? fenofibrate (TRICOR) 48 MG tablet Take 1 tablet (48 mg total) by mouth daily.   ??? finasteride (PROSCAR) 5 mg tablet TAKE 1 TABLET BY MOUTH EVERY DAY   ??? fluconazole (DIFLUCAN) 150 MG tablet TAKE 1 TAB NOW AN THEN TAKE 1 TABLET ONCE WEEKLY AS NEEDED FOR YEAST INFECTION   ??? gabapentin (NEURONTIN) 300 MG capsule Take 1 capsule (300 mg total) by mouth Two (2) times a day.   ??? insulin glargine (LANTUS) 100 unit/mL injection Inject 0.6 mL (60 Units total) under the skin nightly.   ??? insulin lispro (HUMALOG) 100 unit/mL injection Inject 0.1 mL (10 Units total) under the skin Three (3) times a day before meals.   ??? levothyroxine (SYNTHROID, LEVOTHROID) 50 MCG tablet Take by mouth. Taking on Monday Wednesday Friday, Saturday & Sunday and on Tuesday & Thursday   ??? lidocaine (XYLOCAINE) 5 % ointment Apply to painful areas every 3-4 hours as needed   ??? lisinopriL (PRINIVIL,ZESTRIL) 40 MG tablet Take 1 tablet (40 mg total) by mouth daily.   ??? multivitamin capsule Take 1 capsule by mouth daily.   ??? pantoprazole (PROTONIX) 40 MG tablet Take 40 mg by mouth.   ??? pen needle, diabetic 31 gauge x 5/16 Ndle Use 1 needle to skin four times daily as directed   ??? secukinumab (COSENTYX) 150 mg/mL PnIj injection Inject the contents of 2 pens (300 mg total) under the skin every fourteen (14) days as maintenance doses.  START 14 days after last loading dose.     Allergies   Allergen Reactions   ??? [employer] Other (See Comments)   ??? Lamictal [Lamotrigine] Rash   ??? Oxycodone Nausea And Vomiting       PHYSICAL EXAM:  Vitals:    08/14/21 0500   BP: 136/64   Pulse: 89   Resp: 18   Temp: 36.6 ??C (97.9 ??F)   SpO2: 100%       Sharin Grave, MD

## 2021-08-15 LAB — BASIC METABOLIC PANEL
ANION GAP: 8 mmol/L (ref 5–14)
BLOOD UREA NITROGEN: 25 mg/dL — ABNORMAL HIGH (ref 9–23)
BUN / CREAT RATIO: 22
CALCIUM: 9.2 mg/dL (ref 8.7–10.4)
CHLORIDE: 107 mmol/L (ref 98–107)
CO2: 24.4 mmol/L (ref 20.0–31.0)
CREATININE: 1.12 mg/dL — ABNORMAL HIGH
EGFR CKD-EPI (2021) FEMALE: 62 mL/min/{1.73_m2} (ref >=60)
GLUCOSE RANDOM: 194 mg/dL — ABNORMAL HIGH (ref 70–99)
POTASSIUM: 4.2 mmol/L (ref 3.4–4.8)
SODIUM: 139 mmol/L (ref 135–145)

## 2021-08-15 MED ORDER — GABAPENTIN 300 MG CAPSULE
ORAL_CAPSULE | Freq: Two times a day (BID) | ORAL | 0 refills | 30.00000 days | Status: CP
Start: 2021-08-15 — End: 2021-09-14
  Filled 2021-08-15: qty 60, 30d supply, fill #0

## 2021-08-15 MED ORDER — BUMETANIDE 1 MG TABLET
ORAL_TABLET | ORAL | 0 refills | 33 days | Status: CP
Start: 2021-08-15 — End: 2021-09-17
  Filled 2021-08-15: qty 14, 33d supply, fill #0

## 2021-08-15 MED ORDER — GUAIFENESIN 100 MG/5 ML ORAL LIQUID
Freq: Four times a day (QID) | ORAL | 0 refills | 6 days | Status: CP | PRN
Start: 2021-08-15 — End: ?
  Filled 2021-08-15: qty 100, 25d supply, fill #0

## 2021-08-15 MED ADMIN — insulin glargine (LANTUS) injection 55 Units: 55 [IU] | SUBCUTANEOUS | @ 13:00:00 | Stop: 2021-08-15

## 2021-08-15 MED ADMIN — enoxaparin (LOVENOX) syringe 40 mg: 40 mg | SUBCUTANEOUS | @ 02:00:00

## 2021-08-15 MED ADMIN — insulin lispro (HumaLOG) injection 25 Units: 25 [IU] | SUBCUTANEOUS | @ 13:00:00 | Stop: 2021-08-15

## 2021-08-15 MED ADMIN — clindamycin (CLEOCIN) capsule 300 mg: 300 mg | ORAL | @ 02:00:00 | Stop: 2021-08-22

## 2021-08-15 MED ADMIN — insulin lispro (HumaLOG) injection 0-20 Units: 0-20 [IU] | SUBCUTANEOUS | @ 12:00:00 | Stop: 2021-08-15

## 2021-08-15 MED ADMIN — enoxaparin (LOVENOX) syringe 40 mg: 40 mg | SUBCUTANEOUS | @ 13:00:00 | Stop: 2021-08-15

## 2021-08-15 MED ADMIN — fenofibrate (TRICOR) tablet 48 mg: 48 mg | ORAL | @ 13:00:00 | Stop: 2021-08-15

## 2021-08-15 MED ADMIN — nystatin (MYCOSTATIN) powder 1 application: 1 | TOPICAL | @ 13:00:00 | Stop: 2021-08-15

## 2021-08-15 MED ADMIN — guaiFENesin (ROBITUSSIN) oral syrup: 100 mg | ORAL | @ 10:00:00 | Stop: 2021-08-15

## 2021-08-15 MED ADMIN — pantoprazole (PROTONIX) EC tablet 40 mg: 40 mg | ORAL | @ 13:00:00 | Stop: 2021-08-15

## 2021-08-15 MED ADMIN — insulin lispro (HumaLOG) injection 0-20 Units: 0-20 [IU] | SUBCUTANEOUS | @ 02:00:00

## 2021-08-15 MED ADMIN — oxymetazoline (AFRIN) 0.05 % nasal spray 3 spray: 3 | NASAL | @ 13:00:00 | Stop: 2021-08-15

## 2021-08-15 MED ADMIN — heparin, porcine (PF) 100 unit/mL injection 500 Units: 500 [IU] | INTRAVENOUS | @ 13:00:00 | Stop: 2021-08-15

## 2021-08-15 MED ADMIN — clindamycin (CLEOCIN) capsule 300 mg: 300 mg | ORAL | @ 13:00:00 | Stop: 2021-08-15

## 2021-08-15 MED ADMIN — insulin glargine (LANTUS) injection 55 Units: 55 [IU] | SUBCUTANEOUS | @ 02:00:00

## 2021-08-15 MED ADMIN — lisinopriL (PRINIVIL,ZESTRIL) tablet 40 mg: 40 mg | ORAL | @ 13:00:00 | Stop: 2021-08-15

## 2021-08-15 MED ADMIN — oxymetazoline (AFRIN) 0.05 % nasal spray 3 spray: 3 | NASAL | @ 02:00:00 | Stop: 2021-08-16

## 2021-08-15 MED ADMIN — guaiFENesin (ROBITUSSIN) oral syrup: 100 mg | ORAL | @ 13:00:00 | Stop: 2021-08-15

## 2021-08-15 MED ADMIN — levothyroxine (SYNTHROID) tablet 75 mcg: 75 ug | ORAL | @ 11:00:00 | Stop: 2021-08-15

## 2021-08-15 MED ADMIN — cetirizine (ZyrTEC) tablet 10 mg: 10 mg | ORAL | @ 13:00:00 | Stop: 2021-08-15

## 2021-08-15 MED ADMIN — cholecalciferol (vitamin D3 25 mcg (1,000 units)) tablet 25 mcg: 25 ug | ORAL | @ 13:00:00 | Stop: 2021-08-15

## 2021-08-15 MED ADMIN — aspirin chewable tablet 81 mg: 81 mg | ORAL | @ 13:00:00 | Stop: 2021-08-15

## 2021-08-15 MED ADMIN — acetaminophen (TYLENOL) tablet 1,000 mg: 1000 mg | ORAL | @ 02:00:00

## 2021-08-15 MED ADMIN — nystatin (MYCOSTATIN) powder 1 application: 1 | TOPICAL | @ 02:00:00

## 2021-08-15 MED ADMIN — finasteride (PROSCAR) tablet 5 mg: 5 mg | ORAL | @ 13:00:00 | Stop: 2021-08-15

## 2021-08-15 MED ADMIN — empagliflozin (JARDIANCE) tablet 25 mg: 25 mg | ORAL | @ 13:00:00 | Stop: 2021-08-15

## 2021-08-15 MED ADMIN — atorvastatin (LIPITOR) tablet 20 mg: 20 mg | ORAL | @ 13:00:00 | Stop: 2021-08-15

## 2021-08-15 MED ADMIN — guaiFENesin (ROBITUSSIN) oral syrup: 100 mg | ORAL | @ 04:00:00

## 2021-08-15 MED FILL — HUMALOG KWIKPEN (U-100) INSULIN 100 UNIT/ML SUBCUTANEOUS: SUBCUTANEOUS | 40 days supply | Qty: 30 | Fill #0

## 2021-08-15 NOTE — Unmapped (Signed)
Patient alert and oriented x 4, spouse in the room, reported pain and managed with Tylenol. Continent of bowel and bladder. On droplet and contact precautions.  Had  Bm in the shift. All her needs attended. Blood sugar monitored and medicated as per order. Will give report to next shift.        Problem: Rehabilitation (IRF) Plan of Care  Goal: Absence of New-Onset Illness or Injury  Intervention: Prevent Fall and Fall Injury  Recent Flowsheet Documentation  Taken 08/15/2021 0000 by Rudell Cobb, RN  Safety Interventions: fall reduction program maintained  Taken 08/14/2021 2200 by Rudell Cobb, RN  Safety Interventions: fall reduction program maintained  Taken 08/14/2021 2000 by Rudell Cobb, RN  Safety Interventions: fall reduction program maintained  Intervention: Prevent Infection  Recent Flowsheet Documentation  Taken 08/14/2021 2200 by Rudell Cobb, RN  Infection Prevention:   rest/sleep promoted   hand hygiene promoted  Taken 08/14/2021 2000 by Rudell Cobb, RN  Infection Prevention:   hand hygiene promoted   single patient room provided  Intervention: Prevent VTE (Venous Thromboembolism)  Recent Flowsheet Documentation  Taken 08/14/2021 2000 by Rudell Cobb, RN  VTE Prevention/Management: anticoagulant therapy     Problem: Self-Care Deficit  Goal: Improved Ability to Complete Activities of Daily Living  Intervention: Promote Activity and Functional Independence  Recent Flowsheet Documentation  Taken 08/14/2021 2000 by Rudell Cobb, RN  Self-Care Promotion: independence encouraged     Problem: Skin Injury Risk Increased  Goal: Skin Health and Integrity  Intervention: Optimize Skin Protection  Recent Flowsheet Documentation  Taken 08/15/2021 0000 by Rudell Cobb, RN  Head of Bed [facility]) Positioning: [facility] elevated  Skin Protection: incontinence pads utilized  Taken 08/14/2021 2200 by Rudell Cobb, RN  Pressure Reduction Techniques: heels elevated off bed  Head of Bed (HOB) Positioning: HOB elevated  Skin Protection: incontinence pads utilized  Taken 08/14/2021 2000 by Rudell Cobb, RN  Pressure Reduction Techniques:   heels elevated off bed   frequent weight shift encouraged  Head of Bed (HOB) Positioning: HOB elevated  Pressure Reduction Devices: positioning supports utilized  Skin Protection: incontinence pads utilized     Problem: Fluid Volume Excess  Goal: Fluid Balance  Intervention: Monitor and Manage Hypervolemia  Recent Flowsheet Documentation  Taken 08/15/2021 0000 by Rudell Cobb, RN  Skin Protection: incontinence pads utilized  Taken 08/14/2021 2200 by Rudell Cobb, RN  Skin Protection: incontinence pads utilized  Taken 08/14/2021 2000 by Rudell Cobb, RN  Skin Protection: incontinence pads utilized     Problem: Fall Injury Risk  Goal: Absence of Fall and Fall-Related Injury  Intervention: Identify and Manage Contributors  Recent Flowsheet Documentation  Taken 08/14/2021 2000 by Rudell Cobb, RN  Self-Care Promotion: independence encouraged  Intervention: Promote Injury-Free Environment  Recent Flowsheet Documentation  Taken 08/15/2021 0000 by Rudell Cobb, RN  Safety Interventions: fall reduction program maintained  Taken 08/14/2021 2200 by Rudell Cobb, RN  Safety Interventions: fall reduction program maintained  Taken 08/14/2021 2000 by Rudell Cobb, RN  Safety Interventions: fall reduction program maintained

## 2021-08-15 NOTE — Unmapped (Addendum)
Family Medicine Inpatient Service  Brief Consult Note    ASSESSMENT/ PLAN:   Summer Russell is a 46 y.o. female with a history of HTN, HLD, GERD, uncontrolled type 2 diabetes, polyneuropathy, hypothyroidism,  psoriasis complicated by arthritis (on long-standing infliximab infusions), fibromyalgia, and sleep apnea admitted to [facility] 12/20 for severe hyperglycemia and lactic acidosis and nephrotic range proteinuria along with worsening left sided weakness, urinary retention and saddle anesthesia who is seen in consultation at the request of PMR colleages for evaluation of insulin management for T2DM and volume overload in the setting of CKD.    Given today is day of discharge, below we have final recommendations:    1. Continue current regimen of Jardiance 25 mg daily, Insulin Lispro 25 units with meals, Lantus 55 units BID. Suggest applying for Pharmacy assistance program if finances are a concern.   2. Patient should continue 4 times a day blood sugar checks and log them for her PCP to revisit. This means patient will need formal diabetes education prior to discharge and should discharge with Rx of Glucometer, appropriate strips for blood sugar checks, lancets, alcohol swabs.   3. If able, Mrs Sheana Bir would be an excellent candidate for continuous blood sugar monitoring devices such as freestyle libre. Would recommend follow up for this on discharge  4. PCP to complete foot exam, retinopathy screening.  5. For volume overload, would continue Bumex MWF schedule. She has had some responsiveness to diuretic, but at the expense of a mild increase in creatinine.  6. Both PCP and Nephrology should be involved outpatient in managing her volume overload and Cr.   7. Needs BMP, Mg within 1 week for Cr and electrolytes given diuresis.  8. Compression stocking daily for lymphedema  9. Reschedule outpatient Nephro follow up- will likely need kidney biopsy on outpatient basis.   10. Consider addition of GLP1-agonist inpatient vs outpatient for glycemic control and weight loss (patient would prefer daily vs once weekly injections given difficulties in adherence to once weekly previously)  11. Okay to restart gabapentin 300 mg BID      Please page Family Medicine Admission Pager [phone]) for questions or further consult needs.     HPI:  Summer Russell is a 46 y.o. female with a history of HTN, HLD, GERD, uncontrolled type 2 diabetes, polyneuropathy, hypothyroidism,  psoriasis complicated by arthritis (on long-standing infliximab infusions), fibromyalgia, and sleep apnea admitted to [facility] following hospitalization with hyperglycemia and new focal deficits.     MEDICATIONS/ ALLERGIES:  No current facility-administered medications on file prior to encounter.     Current Outpatient Medications on File Prior to Encounter   Medication Sig   ??? ascorbic Acid (VITAMIN C) 500 mg CpER Take 500 mg by mouth.   ??? BD INSULIN SYRINGE ULTRA-FINE 1 mL 31 gauge x 5/16 (8 mm) Syrg USE 2 (TWO) TIMES DAILY AS DIRECTED   ??? blood sugar diagnostic Strp Frequency:PHARMDIR   Dosage:0.0     Instructions:  Note:Use to test blood sugar in the morning before breakfast and in the evening two hours after dinner, ISD-9 250.00. Dose: 1   ??? blood-glucose meter Misc U QID UTD   ??? empty container Misc Use as directed to dispose of Cosentyx pens.   ??? fluconazole (DIFLUCAN) 150 MG tablet TAKE 1 TAB NOW AN THEN TAKE 1 TABLET ONCE WEEKLY AS NEEDED FOR YEAST INFECTION   ??? gabapentin (NEURONTIN) 300 MG capsule Take 1 capsule (300 mg total) by mouth  Two (2) times a day.   ??? levothyroxine (SYNTHROID, LEVOTHROID) 50 MCG tablet Take by mouth. Taking on Monday Wednesday Friday, Saturday & Sunday and on Tuesday & Thursday   ??? lidocaine (XYLOCAINE) 5 % ointment Apply to painful areas every 3-4 hours as needed   ??? multivitamin capsule Take 1 capsule by mouth daily.   ??? pen needle, diabetic 31 gauge x 5/16 Ndle Use 1 needle to skin four times daily as directed ??? secukinumab (COSENTYX) 150 mg/mL PnIj injection Inject the contents of 2 pens (300 mg total) under the skin every fourteen (14) days as maintenance doses.  START 14 days after last loading dose.     Allergies   Allergen Reactions   ??? [employer] Other (See Comments)   ??? Lamictal [Lamotrigine] Rash   ??? Oxycodone Nausea And Vomiting       PHYSICAL EXAM:  Vitals:    08/15/21 0555   BP: 150/70   Pulse: 101   Resp: 19   Temp: 36.8 ??C (98.2 ??F)   SpO2: 96%       Bernardo Heater, DO  Family Medicine

## 2021-08-17 ENCOUNTER — Ambulatory Visit: Admit: 2021-08-17 | Discharge: 2021-08-18 | Payer: BLUE CROSS/BLUE SHIELD | Attending: Nephrology | Primary: Nephrology

## 2021-08-17 DIAGNOSIS — R609 Edema, unspecified: Principal | ICD-10-CM

## 2021-08-17 LAB — RENAL FUNCTION PANEL
ALBUMIN: 2.6 g/dL — ABNORMAL LOW (ref 3.4–5.0)
ANION GAP: 10 mmol/L (ref 5–14)
BLOOD UREA NITROGEN: 28 mg/dL — ABNORMAL HIGH (ref 9–23)
BUN / CREAT RATIO: 24
CALCIUM: 9.2 mg/dL (ref 8.7–10.4)
CHLORIDE: 107 mmol/L (ref 98–107)
CO2: 25.9 mmol/L (ref 20.0–31.0)
CREATININE: 1.18 mg/dL — ABNORMAL HIGH
EGFR CKD-EPI (2021) FEMALE: 59 mL/min/{1.73_m2} — ABNORMAL LOW (ref >=60)
GLUCOSE RANDOM: 206 mg/dL — ABNORMAL HIGH (ref 70–179)
PHOSPHORUS: 4.3 mg/dL (ref 2.4–5.1)
POTASSIUM: 4.3 mmol/L (ref 3.4–4.8)
SODIUM: 143 mmol/L (ref 135–145)

## 2021-08-17 MED ORDER — BUMETANIDE 1 MG TABLET
ORAL_TABLET | ORAL | 0 refills | 20 days | Status: CP
Start: 2021-08-17 — End: 2021-09-16

## 2021-08-17 NOTE — Unmapped (Signed)
PCP:  [facility]      08/17/2021      ASSESSMENT/PLAN:      [name] is a 46 y.o. year old patient with a past medical history significant for T2DM, AKI, Lactic Acidosis, obesity. She is being seen for follow up visit.     1. Nephrotic Range Proteinuria  Urine PCR 14.4 g, urine ACR 7.6 g during recent evaluation.  The plan has been for kidney biopsy.  It has finally been scheduled for 08/31/2021 with [facility].  It is not clear to me that the benefits of performing the biopsy will outweigh the potential risks.  In my opinion, it will primarily be a diagnostic kidney biopsy demonstrating advanced diabetic kidney disease as well as focal segmental glomerular sclerosis.  Neither of these lesions would change current management.  Furthermore, her body habitus makes kidney biopsy riskier than for someone with a normal body habitus both in terms of the frequency of complications (esp bleeding), as well as being able to intervene successfully to amend the complication.     She is on an ACE inhibitor, and a statin agent.    As have not been involved in the care of this patient until now, I will discuss my impression with others who have assessed the patient to come up with a consensus plan.    In the mean time, I will increase the Bumex to 1 mg Monday-Friday to see if it improves her edema. I would like for her to time the dosing until just after she uses the lymphedema press.    2. Acute Kidney Injury  The patient has had mild acute kidney injury.  Most recent labs, including from today, show relatively stable kidney function, albeit lower than normal. She is back on her ACE inhibitor.  I am aware that she is on PPI.    3. Diabetes Mellitus  Type II, and longstanding, leading to likely kidney complications.  I am happy to see that she is on Jardiance.    4. S/P lactic acidosis  Appears to have resolved. No longer on metformin.      [name] will follow up in about 2 weeks with Dr. Chipper Russell. clindamycin (CLEOCIN) 300 MG capsule Take 1 capsule (300 mg total) by mouth Two (2) times a day. 60 capsule 0   ??? empagliflozin (JARDIANCE) 25 mg tablet Take 1 tablet (25 mg total) by mouth daily. 30 tablet 0   ??? empty container Misc Use as directed to dispose of Cosentyx pens. 1 each 2   ??? ergocalciferol-1,250 mcg, 50,000 unit, (DRISDOL) 1,250 mcg (50,000 unit) capsule TAKE 1 CAPSULE BY MOUTH ONE TIME PER WEEK     ??? famotidine (PEPCID) 40 MG tablet Take 1 tablet by mouth nightly.     ??? fenofibrate (TRICOR) 48 MG tablet Take 1 tablet (48 mg total) by mouth daily. 30 tablet 0   ??? finasteride (PROSCAR) 5 mg tablet Take 1 tablet (5 mg total) by mouth daily. 30 tablet 0   ??? fluconazole (DIFLUCAN) 150 MG tablet TAKE 1 TAB NOW AN THEN TAKE 1 TABLET ONCE WEEKLY AS NEEDED FOR YEAST INFECTION 4 tablet 5   ??? fluticasone propionate (FLONASE) 50 mcg/actuation nasal spray Use 1 spray into each nostril daily as needed for rhinitis. 16 g 0   ??? gabapentin (NEURONTIN) 300 MG capsule Take 1 capsule (300 mg total) by mouth two (2) times a day. 60 capsule 0   ??? guaiFENesin (ROBITUSSIN) 100 mg/5  mL syrup Take 5 mL (100 mg total) by mouth four (4) times a day as needed for cough. 118 mL 0   ??? hydrOXYzine (ATARAX) 25 MG tablet Take 1 tablet (25 mg total) by mouth two (2) times a day as needed for itching or anxiety. 30 tablet 0   ??? insulin glargine (BASAGLAR, LANTUS) 100 unit/mL (3 mL) injection pen Inject 0.55 mL (55 Units total) under the skin two (2) times a day. 40 mL 1   ??? insulin lispro (HUMALOG) 100 unit/mL injection pen Inject 25 Units under the skin Three (3) times a day before meals. 30 mL 1   ??? insulin syringe-needle U-100 1 mL 31 gauge x 5/16 (8 mm) Syrg Use with insulin up to 4 times per day as needed. 100 each 0   ??? lancets Misc Use to check blood sugar as directed with insulin 3 times a day & for symptoms of high or low blood sugar. 100 each 0   ??? levothyroxine (SYNTHROID) 50 MCG tablet Take one tablet by mouth every: Sun, Mon, Wed, Fri, Sat 90 tablet 3   ??? levothyroxine (SYNTHROID) 75 MCG tablet Take one tablet by mouth every Tuesday and Thursday. 90 tablet 0   ??? lidocaine (XYLOCAINE) 5 % ointment Apply to painful areas every 3-4 hours as needed 60 g 11   ??? lisinopriL (PRINIVIL,ZESTRIL) 40 MG tablet Take 1 tablet (40 mg total) by mouth daily. 30 tablet 0   ??? multivitamin capsule Take 1 capsule by mouth daily.     ??? nystatin (MYCOSTATIN) 100,000 unit/gram powder Apply 1 application topically Two (2) times a day. 30 g 0   ??? ondansetron (ZOFRAN) 4 MG tablet Take 1 tablet (4 mg total) by mouth daily as needed for up to 15 days. 15 tablet 0   ??? pantoprazole (PROTONIX) 40 MG tablet Take 1 tablet (40 mg total) by mouth daily. 30 tablet 0   ??? pen needle, diabetic 32 gauge x 5/32 (4 mm) Ndle Use with insulin up to 4 times/day as needed. 100 each 0   ??? bumetanide (BUMEX) 1 MG tablet Take 1 tablet (1 mg total) by mouth Every Weekday (Monday-Friday) at 6pm. 14 tablet 0     No current facility-administered medications for this visit.       PHYSICAL EXAM:  Vitals:    08/17/21 1300   BP: 119/52   Pulse: 94   Temp: 36.7 ??C (98 ??F)     CONSTITUTIONAL: Alert,well appearing, no distress  HEENT: Moist mucous membranes, oropharynx clear without erythema or exudate  EYES: Pupils reactive, sclerae anicteric.  NECK: Supple, no lymphadenopathy  CARDIOVASCULAR: Regular, normal S1/S2 heart sounds, no murmurs, no rubs.   PULM: Clear to auscultation bilaterally  GASTROINTESTINAL: Soft, active bowel sounds, nontender  EXTREMITIES: No lower extremity edema bilaterally.   SKIN: No rashes or lesions  NEUROLOGIC: No focal motor or sensory deficits      MEDICAL DECISION MAKING    No results found for: CR]    Lab Results   Component Value Date    NA 139 08/15/2021    K 4.2 08/15/2021    CL 107 08/15/2021    CO2 24.4 08/15/2021    BUN 25 (H) 08/15/2021    CREATININE 1.12 (H) 08/15/2021    GFR >= 60 06/02/2012    ALBUMIN 2.7 (L) 08/11/2021     Lab Results   Component mouth daily. 30 tablet 0   ??? finasteride (PROSCAR) 5 mg tablet Take 1 tablet (  5 mg total) by mouth daily. 30 tablet 0   ??? fluconazole (DIFLUCAN) 150 MG tablet TAKE 1 TAB NOW AN THEN TAKE 1 TABLET ONCE WEEKLY AS NEEDED FOR YEAST INFECTION 4 tablet 5   ??? fluticasone propionate (FLONASE) 50 mcg/actuation nasal spray Use 1 spray into each nostril daily as needed for rhinitis. 16 g 0   ??? gabapentin (NEURONTIN) 300 MG capsule Take 1 capsule (300 mg total) by mouth two (2) times a day. 60 capsule 0   ??? guaiFENesin (ROBITUSSIN) 100 mg/5 mL syrup Take 5 mL (100 mg total) by mouth four (4) times a day as needed for cough. 118 mL 0   ??? hydrOXYzine (ATARAX) 25 MG tablet Take 1 tablet (25 mg total) by mouth two (2) times a day as needed for itching or anxiety. 30 tablet 0   ??? insulin glargine (BASAGLAR, LANTUS) 100 unit/mL (3 mL) injection pen Inject 0.55 mL (55 Units total) under the skin two (2) times a day. 40 mL 1   ??? insulin lispro (HUMALOG) 100 unit/mL injection pen Inject 25 Units under the skin Three (3) times a day before meals. 30 mL 1   ??? insulin syringe-needle U-100 1 mL 31 gauge x 5/16 (8 mm) Syrg Use with insulin up to 4 times per day as needed. 100 each 0   ??? lancets Misc Use to check blood sugar as directed with insulin 3 times a day & for symptoms of high or low blood sugar. 100 each 0   ??? levothyroxine (SYNTHROID) 50 MCG tablet Take one tablet by mouth every: Sun, Mon, Wed, Fri, Sat 90 tablet 3   ??? levothyroxine (SYNTHROID) 75 MCG tablet Take one tablet by mouth every Tuesday and Thursday. 90 tablet 0   ??? lidocaine (XYLOCAINE) 5 % ointment Apply to painful areas every 3-4 hours as needed 60 g 11   ??? lisinopriL (PRINIVIL,ZESTRIL) 40 MG tablet Take 1 tablet (40 mg total) by mouth daily. 30 tablet 0   ??? multivitamin capsule Take 1 capsule by mouth daily.     ??? nystatin (MYCOSTATIN) 100,000 unit/gram powder Apply 1 application topically Two (2) times a day. 30 g 0   ??? ondansetron (ZOFRAN) 4 MG tablet Take 1 tablet (4 mg total) by mouth daily as needed for up to 15 days. 15 tablet 0   ??? pantoprazole (PROTONIX) 40 MG tablet Take 1 tablet (40 mg total) by mouth daily. 30 tablet 0   ??? pen needle, diabetic 32 gauge x 5/32 (4 mm) Ndle Use with insulin up to 4 times/day as needed. 100 each 0   ??? bumetanide (BUMEX) 1 MG tablet Take 1 tablet (1 mg total) by mouth Every Weekday (Monday-Friday) at 6pm. 14 tablet 0     No current facility-administered medications for this visit.       PHYSICAL EXAM:  Vitals:    08/17/21 1300   BP: 119/52   Pulse: 94   Temp: 36.7 ??C (98 ??F)     CONSTITUTIONAL: Alert,well appearing, no distress  HEENT: Moist mucous membranes, oropharynx clear without erythema or exudate  EYES: Pupils reactive, sclerae anicteric.  NECK: Supple, no lymphadenopathy  CARDIOVASCULAR: Regular, normal S1/S2 heart sounds, no murmurs, no rubs.   PULM: Clear to auscultation bilaterally  GASTROINTESTINAL: Soft, active bowel sounds, nontender  EXTREMITIES: No lower extremity edema bilaterally.   SKIN: No rashes or lesions  NEUROLOGIC: No focal motor or sensory deficits      MEDICAL DECISION MAKING  Lab Results   Component Value Date    NA 139 08/15/2021    K 4.2 08/15/2021    CL 107 08/15/2021    CO2 24.4 08/15/2021    BUN 25 (H) 08/15/2021    CREATININE 1.12 (H) 08/15/2021    GFR >= 60 06/02/2012    ALBUMIN 2.7 (L) 08/11/2021     Lab Results   Component Value Date    WBC 3.9 08/14/2021    PLT 216 08/14/2021     Lab Results   Component Value Date    CALCIUM 9.2 08/15/2021

## 2021-08-17 NOTE — Unmapped (Addendum)
Dear Ms Okey Dupre    It was nice to meet you today.  As you know, your here to follow-up for your kidney disease and protein in the urine.  I think overall you are doing well.  However because of your weight gain of almost 20 pounds, I would like to increase your dose of Bumex 1 mg daily from Monday Wednesday Friday to Monday through Friday.  Please time the dosing of the Bumex after your lymphedema treatment to optimize fluid removal.    Today I would like to check your labs.  I would also like to check your labs next week after the increased dose of Bumex.    As for the planned biopsy, please keep it on the schedule for now.  I will talk with the team that has taken care of you to see whether we need to do this biopsy.    All the best,    Dr. Kirtland Bouchard

## 2021-08-23 NOTE — Unmapped (Signed)
I spoke with the patient today and asked her to cancel the planned kidney biopsy by [facility] on August 31, 2021.  She will follow-up with nephrology, Dr. Chipper Herb, on September 01, 2021.  At that time we will revisit whether to pursue kidney biopsy to work-up the patient's nephrotic range proteinuria.

## 2021-08-30 NOTE — Unmapped (Signed)
08/30/2021 Spoke with pt, pt states she no longer needs [facility] Procedure, will reach out to referring provider to confirm.(SG)

## 2021-08-30 NOTE — Unmapped (Signed)
1/25/2023Ravi Montel Clock, MD ??Blenda Peals, RN; [name], MD   Hi ??No need for [facility] biopsy, cancel request. Thanks

## 2021-09-01 ENCOUNTER — Ambulatory Visit
Admit: 2021-09-01 | Discharge: 2021-09-02 | Payer: BLUE CROSS/BLUE SHIELD | Attending: Student in an Organized Health Care Education/Training Program | Primary: Student in an Organized Health Care Education/Training Program

## 2021-09-01 DIAGNOSIS — R7989 Other specified abnormal findings of blood chemistry: Principal | ICD-10-CM

## 2021-09-01 DIAGNOSIS — R809 Proteinuria, unspecified: Principal | ICD-10-CM

## 2021-09-01 LAB — BASIC METABOLIC PANEL
ANION GAP: 10 mmol/L (ref 5–14)
BLOOD UREA NITROGEN: 26 mg/dL — ABNORMAL HIGH (ref 9–23)
BUN / CREAT RATIO: 23
CALCIUM: 10 mg/dL (ref 8.7–10.4)
CHLORIDE: 101 mmol/L (ref 98–107)
CO2: 27 mmol/L (ref 20.0–31.0)
CREATININE: 1.13 mg/dL — ABNORMAL HIGH
EGFR CKD-EPI (2021) FEMALE: 62 mL/min/{1.73_m2} (ref >=60)
GLUCOSE RANDOM: 208 mg/dL — ABNORMAL HIGH (ref 70–179)
POTASSIUM: 4.1 mmol/L (ref 3.4–4.8)
SODIUM: 138 mmol/L (ref 135–145)

## 2021-09-01 LAB — ALBUMIN: ALBUMIN: 2.8 g/dL — ABNORMAL LOW (ref 3.4–5.0)

## 2021-09-01 NOTE — Unmapped (Addendum)
Increase Bumex to 1 mg twice a day.  Check your weight daily. Send me a MyChart message in about 1 week to let me know how things are going.  If your weight is going up 2 pounds in a day, let me know.  Go to a [facility] clinic in 1-2 weeks for blood work and bring a urine sample.  We will check labs today.  I will message Endocrine about Ozempic.  Follow up in 2 months, or sooner if needed.

## 2021-09-01 NOTE — Unmapped (Addendum)
PCP:  [facility]      09/01/2021      ASSESSMENT/PLAN:      [name] is a 46 y.o. year old patient with a past medical history significant for T2DM (since at least 2005), nephrotic syndrome. She is being seen for follow up visit.    1. Elevated Creatinine: likely mild AKI vs. CKD  Nephrotic Range Proteinuria  - Cr 1.13, stable  - UPC 14.1, UACR 7649  - Serologic workup largely unremarkable. Suspect nephrotic proteinuria is related to uncontrolled diabetes over years.  - She prefers not to do a kidney biopsy unless felt to be absolutely necessary. We will not proceed with kidney biopsy as risks (bleeding) likely outweigh benefits. Likely to find diabetic changes.  - Lisinopril 40 mg daily  - Empagliflozin 25 mg daily  - Bumex increase to 1 mg BID. Ideally time dosing after using lymphedema press.  - Repeat BMP and UACR in 1 week.  - Check weight daily. If weight increasing, will send me a MyChart message.  - Atorvastatin 20 mg daily    2. Type 2 Diabetes Mellitus  - Jardiance 25 mg daily  - Insulin glargine and lispro  - Would add GLP-1 agonist semaglutide (oral version daily if weekly is difficult for her to remember). Will message Endocrine to discuss adding this at future appointment.  - Follow-up with Endocrine  - Metformin was stopped given lactic acidosis during hospitalization.  - For mild nonproliferative retinopathy, would avoid VEGF inhibitors as they can have adverse effect on the kidneys      [name] will follow up in two months.          Chief Complaint: Follow-up visit nephrotic range proteinuria    Background: [name] is a 46 y/o woman with a history of uncontrolled Type 2 diabetes, hypothyroidism, psoriasis. lymphedema.    Has a diagnosis of diabetes since at least 2005 based on chart review.    She was admitted to [facility] in December 2022 with lactic acidosis after outpatient infliximab infusions. Nephrology was consulted for nephrotic syndrome (hypoalbuminemia, frothy urine, edema). UPC was 14.1, and UACR was 7644. She also had an AKI with Cr of 1.49 from baseline of 0.8-1.0. Hepatitis B surface Ag/Ab and core Ab, hepatitis C Ab, HIV, syphilis, anti-dsDNA, PLA2R Ab and THSD7A Ab were negative. SPEP showed oligoclonal banding characteristic of multiple clonality. Serum free light chains showed normal K/L ratio. ANA titer was > 1:640. C3 was normal and C4 was slightly low. Urine sediment showed fatty casts and granular debris. Kidney biopsy was recommended for workup of proteinuria.    She was started on Bumex 1 mg twice a day. Lisinopril was increased to 40 mg daily. Empagliflozin was added. Metformin was discontinued.    Previously, she had a UACR of 243 in 2014. Saw a nephrologist a decade ago for proteinuria, but did not follow-up subsequently.        Interval History/Subjective:    Her weight increased almost 30 pounds since the initial hospitalization in December 2022. She was discharged from [facility] on Bumex 1 mg every Monday Wednesday Friday. At recent nephrology clinic visit, this was increased to Bumex 1 mg Monday through Friday. Has voided more, but is having worsening swelling especially in the abdomen. She thinks she has gained a significant amount of weight over the past weeks.    No fevers. Still having some sinus, cold symptoms, cough from RSV, improved but  not over it yet. Has chronic SOB with exertion. Denies chest pain. Endorses abdominal burning 6/10 that she attributes to swelling. No nausea/vomiting, blood in the stool. Having at least one BM per day. No dysuria or hematuria. No new rash (has psoriasis). Has had chronic neuropathy for possibly a decade (numbness/tingling in feet and a bit in the hands).    Reports blood glucoses are better, with a few in the 90s. She gets hypoglycemic symptoms if her blood glucose is lowered too quickly.    Saw Ophthalmology on 10/04/20. Had mild nonproliferative diabetic retinopathy. Discussed observation vs. anti-VEGF.    Was on Trulicity, but was unable to remember to take it weekly.      ROS:   All systems reviewed and are negative except as listed above.      PAST MEDICAL HISTORY:  Past Medical History:   Diagnosis Date   ??? CKD (chronic kidney disease) 07/25/2021   ??? Diabetes mellitus (CMS-HCC)    ??? Disease of thyroid gland    ??? Fibromyalgia    ??? Hydradenitis    ??? Psoriasis    ??? Psoriatic arthritis (CMS-HCC)    ??? Sleep apnea        ALLERGIES  Venom-honey bee, Magnesium, Diphenhydramine hcl, Lamictal [lamotrigine], and Oxycodone    MEDICATIONS:  Current Outpatient Medications   Medication Sig Dispense Refill   ??? ascorbic Acid (VITAMIN C) 500 mg CpER Take 500 mg by mouth.     ??? aspirin (ECOTRIN) 81 MG tablet Take 1 tablet (81 mg total) by mouth daily. 30 tablet 0   ??? atorvastatin (LIPITOR) 20 MG tablet Take 1 tablet (20 mg total) by mouth daily. 30 tablet 0   ??? blood sugar diagnostic (ACCU-CHEK GUIDE TEST STRIPS) Strp Use to check blood sugar as directed with insulin 3 times a day & for symptoms of high or low blood sugar. 100 each 0   ??? blood-glucose meter Misc USE AS INSTRUCTED 1 each 0   ??? bumetanide (BUMEX) 1 MG tablet Take 1 tablet (1 mg total) by mouth Every Weekday (Monday-Friday) at 6pm. 14 tablet 0   ??? cetirizine (ZYRTEC) 10 MG tablet Take 1 tablet (10 mg total) by mouth daily. 30 tablet 0   ??? cholecalciferol, vitamin D3 25 mcg, 1,000 units,, 1,000 unit (25 mcg) tablet Take 1 tablet (25 mcg total) by mouth daily. 30 tablet 0   ??? clindamycin (CLEOCIN) 300 MG capsule Take 1 capsule (300 mg total) by mouth Two (2) times a day. 60 capsule 0   ??? drospirenone-ethinyl estradioL (NIKKI, 28,) 3-0.02 mg per tablet Take 1 tablet by mouth daily.     ??? empagliflozin (JARDIANCE) 25 mg tablet Take 1 tablet (25 mg total) by mouth daily. 30 tablet 0   ??? empty container Misc Use as directed to dispose of Cosentyx pens. 1 each 2   ??? ergocalciferol-1,250 mcg, 50,000 unit, (DRISDOL) 1,250 mcg (50,000 unit) capsule TAKE 1 CAPSULE BY MOUTH ONE TIME PER WEEK     ??? famotidine (PEPCID) 40 MG tablet Take 1 tablet by mouth nightly.     ??? fenofibrate (TRICOR) 48 MG tablet Take 1 tablet (48 mg total) by mouth daily. 30 tablet 0   ??? finasteride (PROSCAR) 5 mg tablet Take 1 tablet (5 mg total) by mouth daily. 30 tablet 0   ??? fluconazole (DIFLUCAN) 150 MG tablet TAKE 1 TAB NOW AN THEN TAKE 1 TABLET ONCE WEEKLY AS NEEDED FOR YEAST INFECTION 4 tablet 5   ??? fluticasone  propionate (FLONASE) 50 mcg/actuation nasal spray Use 1 spray into each nostril daily as needed for rhinitis. 16 g 0   ??? gabapentin (NEURONTIN) 300 MG capsule Take 1 capsule (300 mg total) by mouth two (2) times a day. 60 capsule 0   ??? guaiFENesin (ROBITUSSIN) 100 mg/5 mL syrup Take 5 mL (100 mg total) by mouth four (4) times a day as needed for cough. 118 mL 0   ??? hydrOXYzine (ATARAX) 25 MG tablet Take 1 tablet (25 mg total) by mouth two (2) times a day as needed for itching or anxiety. 30 tablet 0   ??? insulin glargine (BASAGLAR, LANTUS) 100 unit/mL (3 mL) injection pen Inject 0.55 mL (55 Units total) under the skin two (2) times a day. 40 mL 1   ??? insulin lispro (HUMALOG) 100 unit/mL injection pen Inject 25 Units under the skin Three (3) times a day before meals. 30 mL 1   ??? insulin syringe-needle U-100 1 mL 31 gauge x 5/16 (8 mm) Syrg Use with insulin up to 4 times per day as needed. 100 each 0   ??? lancets Misc Use to check blood sugar as directed with insulin 3 times a day & for symptoms of high or low blood sugar. 100 each 0   ??? levothyroxine (SYNTHROID) 50 MCG tablet Take one tablet by mouth every: Sun, Mon, Wed, Fri, Sat 90 tablet 3   ??? levothyroxine (SYNTHROID) 75 MCG tablet Take one tablet by mouth every Tuesday and Thursday. 90 tablet 0   ??? lidocaine (XYLOCAINE) 5 % ointment Apply to painful areas every 3-4 hours as needed 60 g 11   ??? lisinopriL (PRINIVIL,ZESTRIL) 40 MG tablet Take 1 tablet (40 mg total) by mouth daily. 30 tablet 0   ??? metroNIDAZOLE (METROGEL) 1 % gel Apply topically every hour as needed.     ??? multivitamin capsule Take 1 capsule by mouth daily.     ??? nystatin (MYCOSTATIN) 100,000 unit/gram powder Apply 1 application topically Two (2) times a day. 30 g 0   ??? ondansetron (ZOFRAN) 4 MG tablet Take 1 tablet (4 mg total) by mouth daily as needed for up to 15 days. 15 tablet 0   ??? pantoprazole (PROTONIX) 40 MG tablet Take 1 tablet (40 mg total) by mouth daily. 30 tablet 0   ??? pen needle, diabetic 32 gauge x 5/32 (4 mm) Ndle Use with insulin up to 4 times/day as needed. 100 each 0     No current facility-administered medications for this visit.       PHYSICAL EXAM:  Vitals:    09/01/21 1222   BP: 144/53   Pulse: 88   Temp: 36.4 ??C (97.6 ??F)     CONSTITUTIONAL: Alert, in no acute distress  HEENT: Normocephalic, atraumatic  EYES: Anicteric sclerae, no conjunctival injection  CARDIOVASCULAR: Normal rate, rhythm sounds regular, normal S1/S2, no murmurs, clicks, rubs, gallops. + LE edema bilaterally.  PULM: Clear to auscultation bilaterally, normal work of breathing  GASTROINTESTINAL: Soft, nontender, nondistended  SKIN: No new rash  MSK: No obvious deformities  NEUROLOGIC: No focal deficits  PSYCH: appropriate      MEDICAL DECISION MAKING      Lab Results   Component Value Date    NA 143 08/17/2021    K 4.3 08/17/2021    CL 107 08/17/2021    CO2 25.9 08/17/2021    BUN 28 (H) 08/17/2021    CREATININE 1.18 (H) 08/17/2021    GFR >= 60 06/02/2012  ALBUMIN 2.6 (L) 08/17/2021     Lab Results   Component Value Date    WBC 3.9 08/14/2021    PLT 216 08/14/2021     Lab Results   Component Value Date    CALCIUM 9.2 08/17/2021     Urine sediment (07/26/21): fatty casts and granular debris

## 2021-09-01 NOTE — Unmapped (Signed)
Patient labs drawn in room prior to leaving Nephrology clinic

## 2021-09-05 DIAGNOSIS — R609 Edema, unspecified: Principal | ICD-10-CM

## 2021-09-05 MED ORDER — BUMETANIDE 1 MG TABLET
ORAL_TABLET | Freq: Two times a day (BID) | ORAL | 0 refills | 30 days | Status: CP
Start: 2021-09-05 — End: 2021-10-05

## 2021-09-05 NOTE — Unmapped (Signed)
Per the last MD note pt to increase bumex to 1 mg twice daily. Sent in #60 with no refills.

## 2021-10-05 DIAGNOSIS — R609 Edema, unspecified: Principal | ICD-10-CM

## 2021-10-05 MED ORDER — BUMETANIDE 1 MG TABLET
ORAL_TABLET | Freq: Two times a day (BID) | ORAL | 0 refills | 30 days | Status: CP
Start: 2021-10-05 — End: 2021-11-04

## 2021-10-05 NOTE — Unmapped (Signed)
per md last note continue bumetanide 1mg  1 tab po twice daily qty 60 with 0 refills

## 2021-10-09 NOTE — Unmapped (Signed)
Addended by: Tammi Sou on: 10/08/2021 09:37 PM     Modules accepted: Orders

## 2021-10-27 MED ORDER — DROSPIRENONE 3 MG-ETHINYL ESTRADIOL 0.02 MG TABLET
ORAL_TABLET | Freq: Every day | ORAL | 3 refills | 84 days
Start: 2021-10-27 — End: 2022-10-27

## 2021-10-27 NOTE — Unmapped (Signed)
Refill for Yaz, please advise

## 2021-10-29 MED ORDER — DROSPIRENONE 3 MG-ETHINYL ESTRADIOL 0.02 MG TABLET
ORAL_TABLET | Freq: Every day | ORAL | 3 refills | 84 days | Status: CP
Start: 2021-10-29 — End: 2022-10-29

## 2021-10-30 ENCOUNTER — Ambulatory Visit
Admit: 2021-10-30 | Discharge: 2021-10-31 | Payer: BLUE CROSS/BLUE SHIELD | Attending: Student in an Organized Health Care Education/Training Program | Primary: Student in an Organized Health Care Education/Training Program

## 2021-10-30 DIAGNOSIS — R809 Proteinuria, unspecified: Principal | ICD-10-CM

## 2021-10-30 DIAGNOSIS — R7989 Other specified abnormal findings of blood chemistry: Principal | ICD-10-CM

## 2021-10-30 DIAGNOSIS — R609 Edema, unspecified: Principal | ICD-10-CM

## 2021-10-30 LAB — RENAL FUNCTION PANEL
ALBUMIN: 2.5 g/dL — ABNORMAL LOW (ref 3.4–5.0)
ANION GAP: 10 mmol/L (ref 5–14)
BLOOD UREA NITROGEN: 39 mg/dL — ABNORMAL HIGH (ref 9–23)
BUN / CREAT RATIO: 32
CALCIUM: 9.1 mg/dL (ref 8.7–10.4)
CHLORIDE: 99 mmol/L (ref 98–107)
CO2: 20.7 mmol/L (ref 20.0–31.0)
CREATININE: 1.21 mg/dL — ABNORMAL HIGH
EGFR CKD-EPI (2021) FEMALE: 56 mL/min/{1.73_m2} — ABNORMAL LOW (ref >=60)
GLUCOSE RANDOM: 320 mg/dL — ABNORMAL HIGH (ref 70–179)
PHOSPHORUS: 3.7 mg/dL (ref 2.4–5.1)
SODIUM: 130 mmol/L — ABNORMAL LOW (ref 135–145)

## 2021-10-30 MED ORDER — BUMETANIDE 1 MG TABLET
ORAL_TABLET | Freq: Two times a day (BID) | ORAL | 1 refills | 30 days | Status: CP
Start: 2021-10-30 — End: 2021-11-29

## 2021-10-30 NOTE — Unmapped (Signed)
PC  Hemolyzed KP:  Summer Russell      10/30/2021      ASSESSMENT/PLAN:      [name] is a 46 y.o. year old patient with a past medical history significant for T2DM (since at least 2005), nephrotic syndrome. She is being seen for follow up visit.    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 Serologic workup was largely unremarkable.    CKD G2/3 A3  Nephrotic Range Proteinuria  - Cr 1.21 today, eGFR 56, stable  - K hemolyzed, will check with next lab draw  - UACR: will collect at home and bring to lab when able to.  - Kidney biopsy deferred as risks (bleeding) likely outweigh benefits. Likely to find diabetic changes and/or secondary FSGS.  - Lisinopril 40 mg daily  - Empagliflozin 25 mg daily  - Consider MRA such as spironolactone or finerenone. Bps today are on low side. Advised to obtain a blood pressure cuff so she can check home Bps for [place] to get a trend.  - Bumex 1 mg in the AM, and 1 mg in evening as needed for swelling  - Has not needed a lymphedema press.  - Does not have an appropriate scale to weigh at home. Continue to monitor based on swelling.  - Atorvastatin 20 mg daily    Type 2 Diabetes Mellitus  - Empagliflozin 25 mg daily  - Insulin glargine and lispro  - Consider adding GLP-1 agonist semaglutide (oral version daily if weekly is difficult for her to remember). Messaged Endocrine to discuss adding this at future appointment.  - Follow-up with Endocrine  - Metformin was stopped previously given lactic acidosis during hospitalization.  - For mild nonproliferative retinopathy, would be cautious with VEGF inhibitors as they can have adverse effect on the kidneys    Hyponatremia  - Repeat Na ordered for next time she is able to go to a [facility] lab  - Urine Na and osmolality ordered to be done at same time as serum Na above      [name] will follow up in 3 months.    [name]  Nephroogy Fellow  PGY-5        Chief Complaint: Follow-up visit nephrotic range proteinuria    Background: [name] is a 46 y/o woman with a history of uncontrolled Type 2 diabetes, hypothyroidism, psoriasis. lymphedema.    Has a diagnosis of diabetes since at least 2005 based on chart review.    She was admitted to [facility] in December 2022 with lactic acidosis after outpatient infliximab infusions. Nephrology was consulted for nephrotic syndrome (hypoalbuminemia, frothy urine, edema). UPC was 14.1, and UACR was 7644. She also had an AKI with Cr of 1.49 from baseline of 0.8-1.0. Hepatitis B surface Ag/Ab and core Ab, hepatitis C Ab, HIV, syphilis, anti-dsDNA, PLA2R Ab and THSD7A Ab were negative. SPEP showed oligoclonal banding characteristic of multiple clonality. Serum free light chains showed normal K/L ratio. ANA titer was > 1:640. C3 was normal and C4 was slightly low. Urine sediment showed fatty casts and granular debris. Kidney biopsy was recommended for workup of proteinuria.    She was started on Bumex 1 mg twice a day. Lisinopril was increased to 40 mg daily. Empagliflozin was added. Metformin was discontinued.    Previously, she had a UACR of 243 in 2014. Saw a nephrologist a decade ago for proteinuria, but did not follow-up  subsequently.    Saw Ophthalmology on 10/04/20. Had mild nonproliferative diabetic retinopathy. Discussed observation vs. anti-VEGF.    Was on Trulicity in the past, but was unable to remember to take it weekly.      Interval History/Subjective:    Reports things are going pretty well overall. Weight is 360 lbs today, from 403 lbs on 09/01/21. Has been taking Bumex, and she feels that the swelling has mostly gone away. The swelling does build up if she does not raise her legs every couple of hours. Does not have a cuff to check blood pressure at home.    Sometimes she will not take a dose of Bumex if she feels like too much fluid has been taken off, but then the next day she will have more swelling. A couple of nights a week she will not take the nighttime dose.    She does report cramping.    Mobility has been better with getting the fluid off.    Has been adding less salt to food. Still eats some processed foods.    Reports she forgets to take insulin sometimes. Overall thinks her blood glucose control is better than before. Has follow-up with Endocrine scheduled in May (rescheduled from March due to transportation availability).    Has chronic SOB with exertion. Today she felt a bit lightheaded when she got up to walk. Does not usually have lightheadedness, just feels today she is doing a lot more than usual in terms of getting here to the appointment.        ROS:   All systems reviewed and are negative except as listed above.      PAST MEDICAL HISTORY:  Past Medical History:   Diagnosis Date   ??? CKD (chronic kidney disease) 07/25/2021   ??? Diabetes mellitus (CMS-HCC)    ??? Disease of thyroid gland    ??? Fibromyalgia    ??? Hydradenitis    ??? Psoriasis    ??? Psoriatic arthritis (CMS-HCC)    ??? Sleep apnea        ALLERGIES  Venom-honey bee, Magnesium, Diphenhydramine hcl, Lamictal [lamotrigine], and Oxycodone    MEDICATIONS:  Current Outpatient Medications   Medication Sig Dispense Refill   ??? ascorbic Acid (VITAMIN C) 500 mg CpER Take 1 capsule (500 mg total) by mouth.     ??? aspirin (ECOTRIN) 81 MG tablet Take 1 tablet (81 mg total) by mouth daily. 30 tablet 0   ??? atorvastatin (LIPITOR) 20 MG tablet Take 1 tablet (20 mg total) by mouth daily. 30 tablet 0   ??? blood sugar diagnostic (ACCU-CHEK GUIDE TEST STRIPS) Strp Use to check blood sugar as directed with insulin 3 times a day & for symptoms of high or low blood sugar. 100 each 0   ??? blood-glucose meter Misc USE AS INSTRUCTED 1 each 0   ??? cetirizine (ZYRTEC) 10 MG tablet Take 1 tablet (10 mg total) by mouth daily. 30 tablet 0   ??? cholecalciferol, vitamin D3 25 mcg, 1,000 units,, 1,000 unit (25 mcg) tablet Take 1 tablet (25 mcg total) by mouth daily. 30 tablet 0   ??? drospirenone-ethinyl estradioL (YAZ) 3-0.02 mg per tablet Take 1 tablet by mouth daily. 84 tablet 3   ??? empagliflozin (JARDIANCE) 25 mg tablet Take 1 tablet (25 mg total) by mouth daily. 30 tablet 0   ??? empty container Misc Use as directed to dispose of Cosentyx pens. 1 each 2   ??? ergocalciferol-1,250 mcg, 50,000 unit, (DRISDOL) 1,250 mcg (  50,000 unit) capsule TAKE 1 CAPSULE BY MOUTH ONE TIME PER WEEK     ??? famotidine (PEPCID) 40 MG tablet Take 1 tablet (40 mg total) by mouth nightly.     ??? fenofibrate (TRICOR) 48 MG tablet Take 1 tablet (48 mg total) by mouth.     ??? fluconazole (DIFLUCAN) 150 MG tablet TAKE 1 TAB NOW AN THEN TAKE 1 TABLET ONCE WEEKLY AS NEEDED FOR YEAST INFECTION 4 tablet 5   ??? fluticasone propionate (FLONASE) 50 mcg/actuation nasal spray Use 1 spray into each nostril daily as needed for rhinitis. 16 g 0   ??? gabapentin (NEURONTIN) 300 MG capsule Take 1 capsule (300 mg total) by mouth two (2) times a day. 60 capsule 0   ??? hydrOXYzine (ATARAX) 25 MG tablet Take 1 tablet (25 mg total) by mouth two (2) times a day as needed for itching or anxiety. 30 tablet 0   ??? insulin glargine (BASAGLAR, LANTUS) 100 unit/mL (3 mL) injection pen Inject 0.55 mL (55 Units total) under the skin two (2) times a day. 40 mL 1   ??? insulin lispro (HUMALOG) 100 unit/mL injection pen Inject 25 Units under the skin Three (3) times a day before meals. (Patient taking differently: Inject 25 Units under the skin Three (3) times a day before meals. Patient takes 25 units twice day with meals and evening 30 units) 30 mL 1   ??? insulin syringe-needle U-100 1 mL 31 gauge x 5/16 (8 mm) Syrg Use with insulin up to 4 times per day as needed. 100 each 0   ??? lancets Misc Use to check blood sugar as directed with insulin 3 times a day & for symptoms of high or low blood sugar. 100 each 0   ??? levothyroxine (SYNTHROID) 50 MCG tablet Take one tablet by mouth every: Sun, Mon, Wed, Fri, Sat 90 tablet 3   ??? levothyroxine (SYNTHROID) 75 MCG tablet Take one tablet by mouth every Tuesday and Thursday. 90 tablet 0   ??? lidocaine (XYLOCAINE) 5 % ointment Apply to painful areas every 3-4 hours as needed 60 g 11   ??? lisinopriL (PRINIVIL,ZESTRIL) 40 MG tablet Take 1 tablet (40 mg total) by mouth daily. 30 tablet 0   ??? multivitamin capsule Take 1 capsule by mouth daily.     ??? nystatin (MYCOSTATIN) 100,000 unit/gram powder Apply 1 application topically Two (2) times a day. 30 g 0   ??? pen needle, diabetic 32 gauge x 5/32 (4 mm) Ndle Use with insulin up to 4 times/day as needed. 100 each 0   ??? REMICADE 100 mg injection INFUSE 1,700MG  IV EVERY 4 WEEKS     ??? bumetanide (BUMEX) 1 MG tablet Take 1 tablet (1 mg total) by mouth two (2) times a day. 60 tablet 1   ??? guaiFENesin (ROBITUSSIN) 100 mg/5 mL syrup Take 5 mL (100 mg total) by mouth four (4) times a day as needed for cough. (Patient not taking: Reported on 10/30/2021) 118 mL 0   ??? metroNIDAZOLE (METROGEL) 1 % gel Apply topically every hour as needed. (Patient not taking: Reported on 10/30/2021)       No current facility-administered medications for this visit.       PHYSICAL EXAM:  Vitals:    10/30/21 1232   BP: 109/63   Pulse: 100   Temp: 36.2 ??C (97.1 ??F)     CONSTITUTIONAL: Alert, in no acute distress  HEENT: Normocephalic, atraumatic  EYES: Anicteric sclerae, no conjunctival injection  CARDIOVASCULAR: Normal  rate, rhythm sounds regular, normal S1/S2, no murmurs, clicks, rubs, gallops. Nonpitting BLE edema.  PULM: Clear to auscultation bilaterally, normal work of breathing  GASTROINTESTINAL: Soft, obese, slightly teender  SKIN: No rashes  MSK: No obvious deformities  NEUROLOGIC: No focal deficits  PSYCH: appropriate      MEDICAL DECISION MAKING      Lab Results   Component Value Date    NA 138 09/01/2021    K 4.1 09/01/2021    CL 101 09/01/2021    CO2 27.0 09/01/2021    BUN 26 (H) 09/01/2021    CREATININE 1.13 (H) 09/01/2021    GFR >= 60 06/02/2012    ALBUMIN 2.8 (L) 09/01/2021 Lab Results   Component Value Date    WBC 3.9 08/14/2021    PLT 216 08/14/2021     Lab Results   Component Value Date    CALCIUM 10.0 09/01/2021     Urine sediment (07/26/21): fatty casts and granular debris

## 2021-10-30 NOTE — Unmapped (Signed)
Administered Covid Moderna Bivalent onto the left deltoid. Patient tolerated well without complications.    Accessed and deaccessed port as ordered as per protocol with good blood return. Labs drawn. Patient tolerated well without complications

## 2021-10-30 NOTE — Unmapped (Signed)
AOBP:   Right      arm      Large         cuff     Average :   109/63             Pulse: 100    1st reading:  109/65        Pulse:  103    2nd reading:  111/63          Pulse: 99    3rd reading:    107/60         Pulse: 99

## 2021-10-30 NOTE — Unmapped (Signed)
per md last note continue bumetanide 1 mg take 1 tab po twice a day qty 60 with 1 refill

## 2021-10-30 NOTE — Unmapped (Addendum)
We will check labs today.  Continue medications as you are taking.  Recommend getting a blood pressure monitor: [employer] is a good company. And check your blood pressure at home.  When you are able to, bring back a urine sample to the lab.  Follow up in June.

## 2021-11-07 DIAGNOSIS — L732 Hidradenitis suppurativa: Principal | ICD-10-CM

## 2021-11-08 NOTE — Unmapped (Signed)
4/5-lvm-txt-mychart Regarding 5/25 appt w/patel needs to be r/s.

## 2021-11-14 NOTE — Unmapped (Signed)
Refill request for bumetanide 1 mg denied due to refill being sent on 10/30/2021 qty 60 with 1 refill

## 2021-11-16 DIAGNOSIS — R609 Edema, unspecified: Principal | ICD-10-CM

## 2021-11-16 MED ORDER — BUMETANIDE 1 MG TABLET
ORAL_TABLET | 3 refills | 0 days | Status: CP
Start: 2021-11-16 — End: ?

## 2021-11-16 NOTE — Unmapped (Signed)
Bumetanide 1 mg tablets resent as written by MD. Changed class from NO PRINT to NORMAL.

## 2021-11-20 MED ORDER — HYDROCODONE 5 MG-ACETAMINOPHEN 325 MG TABLET
ORAL_TABLET | 0 refills | 0 days | Status: CP
Start: 2021-11-20 — End: ?

## 2021-11-23 ENCOUNTER — Ambulatory Visit: Admit: 2021-11-23 | Discharge: 2021-11-24 | Payer: BLUE CROSS/BLUE SHIELD

## 2021-11-23 LAB — CBC W/ AUTO DIFF
BASOPHILS ABSOLUTE COUNT: 0.1 10*9/L (ref 0.0–0.1)
BASOPHILS RELATIVE PERCENT: 1.2 %
EOSINOPHILS ABSOLUTE COUNT: 0.4 10*9/L (ref 0.0–0.5)
EOSINOPHILS RELATIVE PERCENT: 4.4 %
HEMATOCRIT: 31.7 % — ABNORMAL LOW (ref 34.0–44.0)
HEMOGLOBIN: 10.5 g/dL — ABNORMAL LOW (ref 11.3–14.9)
LYMPHOCYTES ABSOLUTE COUNT: 1 10*9/L — ABNORMAL LOW (ref 1.1–3.6)
LYMPHOCYTES RELATIVE PERCENT: 10.7 %
MEAN CORPUSCULAR HEMOGLOBIN CONC: 33.2 g/dL (ref 32.0–36.0)
MEAN CORPUSCULAR HEMOGLOBIN: 29.7 pg (ref 25.9–32.4)
MEAN CORPUSCULAR VOLUME: 89.5 fL (ref 77.6–95.7)
MEAN PLATELET VOLUME: 8.3 fL (ref 6.8–10.7)
MONOCYTES ABSOLUTE COUNT: 0.8 10*9/L (ref 0.3–0.8)
MONOCYTES RELATIVE PERCENT: 8.6 %
NEUTROPHILS ABSOLUTE COUNT: 7.2 10*9/L (ref 1.8–7.8)
NEUTROPHILS RELATIVE PERCENT: 75.1 %
PLATELET COUNT: 378 10*9/L (ref 150–450)
RED BLOOD CELL COUNT: 3.54 10*12/L — ABNORMAL LOW (ref 3.95–5.13)
RED CELL DISTRIBUTION WIDTH: 13.8 % (ref 12.2–15.2)
WBC ADJUSTED: 9.5 10*9/L (ref 3.6–11.2)

## 2021-11-23 LAB — AST: AST (SGOT): <8 U/L (ref <=34)

## 2021-11-23 LAB — SEDIMENTATION RATE: ERYTHROCYTE SEDIMENTATION RATE: 60 mm/h — ABNORMAL HIGH (ref 0–20)

## 2021-11-23 LAB — BUN: BLOOD UREA NITROGEN: 33 mg/dL — ABNORMAL HIGH (ref 9–23)

## 2021-11-23 LAB — C-REACTIVE PROTEIN: C-REACTIVE PROTEIN: 172 mg/L — ABNORMAL HIGH (ref <=10.0)

## 2021-11-23 LAB — ALT: ALT (SGPT): <7 U/L — ABNORMAL LOW (ref 10–49)

## 2021-11-23 LAB — CREATININE
CREATININE: 1.31 mg/dL — ABNORMAL HIGH
EGFR CKD-EPI (2021) FEMALE: 51 mL/min/{1.73_m2} — ABNORMAL LOW (ref >=60)

## 2021-11-23 MED ADMIN — cetirizine (ZyrTEC) tablet 10 mg: 10 mg | ORAL | @ 18:00:00 | Stop: 2021-11-23

## 2021-11-23 MED ADMIN — acetaminophen (TYLENOL) tablet 650 mg: 650 mg | ORAL | @ 18:00:00 | Stop: 2021-11-23

## 2021-11-23 MED ADMIN — ondansetron (ZOFRAN-ODT) disintegrating tablet 8 mg: 8 mg | ORAL | @ 18:00:00 | Stop: 2021-11-23

## 2021-11-23 MED ADMIN — heparin, porcine (PF) 100 unit/mL injection 500 Units: 500 [IU] | INTRAVENOUS | @ 20:00:00 | Stop: 2021-11-23

## 2021-11-23 MED ADMIN — inFLIXimab-abda (RENFLEXIS) 10 mg/kg = 1,600 mg in sodium chloride (NS) 500 mL rapid infusion: 10 mg/kg | INTRAVENOUS | @ 19:00:00 | Stop: 2021-11-23

## 2021-11-23 NOTE — Unmapped (Signed)
Patient presents for accelerated Renflexis (infliximab-abda) infusion.  Pt has not received infusion since December d/t transportation issues. Dr Janyth Contes aware.  In no acute distress.  Vitals stable.  Reports no new medical issues or S/S of infection.  IV started.  See MAR for premeds.    1440 Renflexis (infliximab-abda) 1600 mg started to infuse as follows:     200 ml/hr x 15 min then 600 ml/hr for remainder of infusion.    1542 Renflexis (infliximab-abda) infusion complete.  PIV flushed with NS.  Vitals stable.  Patient without any s/s of adverse reaction. IV d/c'd.  Patient discharged from Infusion Center.

## 2021-12-16 DIAGNOSIS — L732 Hidradenitis suppurativa: Principal | ICD-10-CM

## 2021-12-25 ENCOUNTER — Ambulatory Visit: Admit: 2021-12-25 | Discharge: 2021-12-25 | Payer: BLUE CROSS/BLUE SHIELD

## 2021-12-25 DIAGNOSIS — R809 Proteinuria, unspecified: Principal | ICD-10-CM

## 2021-12-25 DIAGNOSIS — R7989 Other specified abnormal findings of blood chemistry: Principal | ICD-10-CM

## 2021-12-25 DIAGNOSIS — E871 Hypo-osmolality and hyponatremia: Principal | ICD-10-CM

## 2021-12-25 LAB — BASIC METABOLIC PANEL
ANION GAP: 13 mmol/L (ref 5–14)
BLOOD UREA NITROGEN: 30 mg/dL — ABNORMAL HIGH (ref 9–23)
BUN / CREAT RATIO: 28
CALCIUM: 9.2 mg/dL (ref 8.7–10.4)
CHLORIDE: 98 mmol/L (ref 98–107)
CO2: 21.3 mmol/L (ref 20.0–31.0)
CREATININE: 1.08 mg/dL — ABNORMAL HIGH
EGFR CKD-EPI (2021) FEMALE: 65 mL/min/{1.73_m2} (ref >=60)
GLUCOSE RANDOM: 413 mg/dL (ref 70–99)
POTASSIUM: 4.3 mmol/L (ref 3.4–4.8)
SODIUM: 132 mmol/L — ABNORMAL LOW (ref 135–145)

## 2021-12-25 LAB — ALBUMIN / CREATININE URINE RATIO
ALBUMIN QUANT URINE: 70.9 mg/dL
ALBUMIN/CREATININE RATIO: 5291 ug/mg — ABNORMAL HIGH (ref 0.0–30.0)
CREATININE, URINE: 13.4 mg/dL

## 2021-12-25 LAB — SODIUM, URINE, RANDOM: SODIUM URINE: 96 mmol/L

## 2021-12-25 LAB — OSMOLALITY, RANDOM URINE: OSMOLALITY URINE: 418 mosm/kg

## 2021-12-25 MED ADMIN — ondansetron (ZOFRAN-ODT) disintegrating tablet 8 mg: 8 mg | ORAL | @ 17:00:00 | Stop: 2021-12-25

## 2021-12-25 MED ADMIN — inFLIXimab-abda (RENFLEXIS) 10 mg/kg = 1,600 mg in sodium chloride (NS) 500 mL rapid infusion: 10 mg/kg | INTRAVENOUS | @ 18:00:00 | Stop: 2021-12-25

## 2021-12-25 MED ADMIN — cetirizine (ZyrTEC) tablet 10 mg: 10 mg | ORAL | @ 17:00:00 | Stop: 2021-12-25

## 2021-12-25 MED ADMIN — heparin, porcine (PF) 100 unit/mL injection 500 Units: 500 [IU] | INTRAVENOUS | @ 19:00:00 | Stop: 2021-12-25

## 2021-12-25 MED ADMIN — acetaminophen (TYLENOL) tablet 650 mg: 650 mg | ORAL | @ 17:00:00 | Stop: 2021-12-25

## 2021-12-25 NOTE — Unmapped (Signed)
Left voicemail about blood glucose of 413. Advised to recheck blood glucose at home, may need to take some short-acting insulin. If remains very high or she is very symptomatic, she should go to the Emergency Department.

## 2021-12-25 NOTE — Unmapped (Signed)
Pt presents for accelerated Remicade.  Pt denies any recent infection, VSS. Right chest port accessed with 20gX3/4inch Huber needle, premeds administered.  Pt aware of potential reaction/side effects, call bell within reach.    1347 Remicade 1600mg  started at the following rates:    288ml/hr for 15 min  677ml/hr for rest of infusion.    1452 Infusion complete. Pt tolerated without complication, VSS. Right chest port flushed per policy, heparin locked and d/c'd, gauze and band aid applied.  Pt left clinic in no acute distress.

## 2022-01-15 ENCOUNTER — Ambulatory Visit
Admit: 2022-01-15 | Discharge: 2022-01-16 | Payer: BLUE CROSS/BLUE SHIELD | Attending: Student in an Organized Health Care Education/Training Program | Primary: Student in an Organized Health Care Education/Training Program

## 2022-01-15 DIAGNOSIS — R7989 Other specified abnormal findings of blood chemistry: Principal | ICD-10-CM

## 2022-01-15 DIAGNOSIS — E1121 Type 2 diabetes mellitus with diabetic nephropathy: Principal | ICD-10-CM

## 2022-01-15 DIAGNOSIS — E669 Obesity, unspecified: Principal | ICD-10-CM

## 2022-01-15 DIAGNOSIS — Z794 Long term (current) use of insulin: Principal | ICD-10-CM

## 2022-01-15 LAB — BASIC METABOLIC PANEL
ANION GAP: 11 mmol/L (ref 5–14)
BLOOD UREA NITROGEN: 32 mg/dL — ABNORMAL HIGH (ref 9–23)
BUN / CREAT RATIO: 26
CALCIUM: 9.2 mg/dL (ref 8.7–10.4)
CHLORIDE: 99 mmol/L (ref 98–107)
CO2: 20.6 mmol/L (ref 20.0–31.0)
CREATININE: 1.25 mg/dL — ABNORMAL HIGH
EGFR CKD-EPI (2021) FEMALE: 54 mL/min/{1.73_m2} — ABNORMAL LOW (ref >=60)
GLUCOSE RANDOM: 248 mg/dL — ABNORMAL HIGH (ref 70–179)
POTASSIUM: 3.9 mmol/L (ref 3.4–4.8)
SODIUM: 131 mmol/L — ABNORMAL LOW (ref 135–145)

## 2022-01-15 MED ORDER — SPIRONOLACTONE 25 MG TABLET
ORAL_TABLET | Freq: Every day | ORAL | 3 refills | 180 days | Status: CP
Start: 2022-01-15 — End: ?

## 2022-01-15 MED ORDER — GLIPIZIDE 5 MG TABLET
ORAL_TABLET | Freq: Every day | ORAL | 3 refills | 90 days | Status: CP
Start: 2022-01-15 — End: ?

## 2022-01-15 MED ORDER — RYBELSUS 3 MG TABLET
ORAL_TABLET | Freq: Every day | ORAL | 1 refills | 0 days | Status: CP
Start: 2022-01-15 — End: ?

## 2022-01-15 NOTE — Unmapped (Addendum)
Start glipizide 5 mg daily for diabetes. If you tolerate it we can eventually increase to 5 mg twice a day.  Will prescribe Rybelsus 3 mg daily. Will see if it is covered. Do not pick it up if the cost is too much.  We will check blood work today, to decide on starting spironolactone.  Follow up in 3 months.

## 2022-01-15 NOTE — Unmapped (Signed)
PCP:  [facility]      01/15/2022      ASSESSMENT/PLAN:      [name] is a 46 y.o. year old patient with a past medical history significant for T2DM (since at least 2005), nephrotic syndrome. She is being seen for follow up visit.    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 Serologic workup was largely unremarkable. Kidney biopsy was deferred as risks (bleeding) likely outweigh benefits. Likely to find diabetic changes and/or secondary FSGS.    Currently the major issue is uncontrolled diabetes. She has missed Endocrine appointments due to needing to set up transportation to get to appointments, but not always able to get there on time.    CKD G2/3 A3  Nephrotic Range Proteinuria  - Cr 1.25 today, eGFR 54, stable and at baseline  - UACR 5291 mg/g on 12/25/21, improved from prior  - Lisinopril 40 mg daily  - Empagliflozin 25 mg daily  - Start spironolactone 12.5 mg daily. Monitor for any low blood pressures or lightheadedness.  - Repeat BMP in 1-2 weeks (has infusion appt coming up).  - Continue Bumex 1 mg every morning, and 1 mg as needed every evening for swelling  - Has not needed a lymphedema press.  - Does not have an appropriate scale to weigh at home. Continue to monitor based on swelling.  - Atorvastatin 20 mg daily    Type 2 Diabetes Mellitus  - Empagliflozin 25 mg daily  - Insulin glargine and lispro. She is not taking consistently due to mental block/aversion to injections. Advised to try to at least take insulin glargine twice a day.  - Start glipizide 5 mg daily. Will plan to uptitrate to 5 mg BID in future if tolerates.  - Ordered Rybelsus (semaglutide) 3 mg daily. Will need to check if this is covered by insurance. She was on Trulicity but not able to continue due to not aversion to injections.  - Has Endocrine appointment coming up.  - Will hold off on A1c check today as likely will be checked at Endocrine appointment.  - Discuss with Endocrine CGM, insulin pump  - Metformin was stopped previously given significant lactic acidosis during hospitalization. We will not plan to restart.  - Follow with Ophthalmology. For mild nonproliferative retinopathy, would be cautious with VEGF inhibitors as they can have adverse effect on the kidneys.  - Will need continued management/adjustment of medications in a timely manner, between PCP, Endocrine, and Nephrology. Ideally with appointments spaced out.    Hyponatremia  - Urine Na 96, urine osmolality 418 on 12/25/21. She is on Bumex.  - Continue to monitor.      [name] will follow up in 3 months.      [name]  Nephroogy Fellow  PGY-5        Chief Complaint: Follow-up visit nephrotic range proteinuria    Background: [name] is a 46 y/o woman with a history of uncontrolled Type 2 diabetes, hypothyroidism, psoriasis. lymphedema.    Has a diagnosis of diabetes since at least 2005 based on chart review.    She was admitted to [facility] in December 2022 with lactic acidosis after outpatient infliximab infusions. Nephrology was consulted for nephrotic syndrome (hypoalbuminemia, frothy urine, edema). UPC was 14.1, and UACR was 7644. She also had an AKI with Cr of 1.49 from baseline of 0.8-1.0. Hepatitis B surface Ag/Ab and core Ab, hepatitis  C Ab, HIV, syphilis, anti-dsDNA, PLA2R Ab and THSD7A Ab were negative. SPEP showed oligoclonal banding characteristic of multiple clonality. Serum free light chains showed normal K/L ratio. ANA titer was > 1:640. C3 was normal and C4 was slightly low. Urine sediment showed fatty casts and granular debris. Kidney biopsy was recommended for workup of proteinuria.    She was started on Bumex 1 mg twice a day. Lisinopril was increased to 40 mg daily. Empagliflozin was added. Metformin was discontinued.    Previously, she had a UACR of 243 in 2014. Saw a nephrologist a decade ago for proteinuria, but did not follow-up subsequently.    Saw Ophthalmology on 10/04/20. Had mild nonproliferative diabetic retinopathy. Discussed observation vs. anti-VEGF.        Interval History/Subjective:    Feels like most of the swelling in her legs has gone away, and swelling in the abdomen has gotten much better. Continues on Bumex 1 mg BID. Reports breathing is ok. Weight is 359 lbs today, about stable from last visit here, and 403 lbs on 09/01/21.    Does not have a cuff to check blood pressure at home. Bps at two recent clinic visits have been 110s systolic. Has not been adding salt to food. Gets food from the deli sometimes.    Diabetes is poorly controlled. She is not consistently taking her insulin and is running into a mental block. Also reluctant due to the pain of needle sticks. Plans to see Endocrinology in August. Saw PCP in May. Is supposed to take Lantus 55 units BID, but encouraged to take 55 units at least daily now. Is supposed to take Humalog 25-30 units with meals, but is taking infrequently. Taking Jardiance daily (uses a pill box). Tried Trulicity in the past but did not tolerate the injections. Morning blood glucoses are sometimes in the 400s or too high to detect.    Occasionally has vertigo when she tilts her head from back to forward. Was on Zyrtec in the past for this before, but no longer.        ROS:   All systems reviewed and are negative except as listed above.      PAST MEDICAL HISTORY:  Past Medical History:   Diagnosis Date   ??? CKD (chronic kidney disease) 07/25/2021   ??? Diabetes mellitus (CMS-HCC)    ??? Disease of thyroid gland    ??? Fibromyalgia    ??? Hydradenitis    ??? Psoriasis    ??? Psoriatic arthritis (CMS-HCC)    ??? Sleep apnea        ALLERGIES  Venom-honey bee, Magnesium, Diphenhydramine hcl, Lamictal [lamotrigine], and Oxycodone    MEDICATIONS:  Current Outpatient Medications   Medication Sig Dispense Refill   ??? ascorbic Acid (VITAMIN C) 500 mg CpER Take 1 capsule (500 mg total) by mouth.     ??? aspirin (ECOTRIN) 81 MG tablet Take 1 tablet (81 mg total) by mouth daily. 30 tablet 0   ??? atorvastatin (LIPITOR) 20 MG tablet Take 1 tablet (20 mg total) by mouth daily. 30 tablet 0   ??? blood sugar diagnostic (ACCU-CHEK GUIDE TEST STRIPS) Strp Use to check blood sugar as directed with insulin 3 times a day & for symptoms of high or low blood sugar. 100 each 0   ??? blood-glucose meter Misc USE AS INSTRUCTED 1 each 0   ??? bumetanide (BUMEX) 1 MG tablet Take 1 tablet every morning. Take 1 tablet every evening as needed for swelling. 180 tablet 3   ???  cetirizine (ZYRTEC) 10 MG tablet Take 1 tablet (10 mg total) by mouth daily. 30 tablet 0   ??? cholecalciferol, vitamin D3 25 mcg, 1,000 units,, 1,000 unit (25 mcg) tablet Take 1 tablet (25 mcg total) by mouth daily. 30 tablet 0   ??? drospirenone-ethinyl estradioL (YAZ) 3-0.02 mg per tablet Take 1 tablet by mouth daily. 84 tablet 3   ??? empagliflozin (JARDIANCE) 25 mg tablet Take 1 tablet (25 mg total) by mouth daily. 30 tablet 0   ??? empty container Misc Use as directed to dispose of Cosentyx pens. 1 each 2   ??? ergocalciferol-1,250 mcg, 50,000 unit, (DRISDOL) 1,250 mcg (50,000 unit) capsule TAKE 1 CAPSULE BY MOUTH ONE TIME PER WEEK     ??? famotidine (PEPCID) 40 MG tablet Take 1 tablet (40 mg total) by mouth nightly.     ??? fenofibrate (TRICOR) 48 MG tablet Take 1 tablet (48 mg total) by mouth.     ??? fluconazole (DIFLUCAN) 150 MG tablet TAKE 1 TAB NOW AN THEN TAKE 1 TABLET ONCE WEEKLY AS NEEDED FOR YEAST INFECTION 4 tablet 5   ??? fluticasone propionate (FLONASE) 50 mcg/actuation nasal spray Use 1 spray into each nostril daily as needed for rhinitis. 16 g 0   ??? gabapentin (NEURONTIN) 300 MG capsule Take 1 capsule (300 mg total) by mouth two (2) times a day. 60 capsule 0   ??? guaiFENesin (ROBITUSSIN) 100 mg/5 mL syrup Take 5 mL (100 mg total) by mouth four (4) times a day as needed for cough. (Patient not taking: Reported on 10/30/2021) 118 mL 0   ??? HYDROcodone-acetaminophen (NORCO) 5-325 mg per tablet Take 1 tablet every 6 hours as needed for pain 15 tablet 0   ??? hydrOXYzine (ATARAX) 25 MG tablet Take 1 tablet (25 mg total) by mouth two (2) times a day as needed for itching or anxiety. 30 tablet 0   ??? insulin glargine (BASAGLAR, LANTUS) 100 unit/mL (3 mL) injection pen Inject 0.55 mL (55 Units total) under the skin two (2) times a day. 40 mL 1   ??? insulin lispro (HUMALOG) 100 unit/mL injection pen Inject 25 Units under the skin Three (3) times a day before meals. (Patient taking differently: Inject 25 Units under the skin Three (3) times a day before meals. Patient takes 25 units twice day with meals and evening 30 units) 30 mL 1   ??? insulin syringe-needle U-100 1 mL 31 gauge x 5/16 (8 mm) Syrg Use with insulin up to 4 times per day as needed. 100 each 0   ??? lancets Misc Use to check blood sugar as directed with insulin 3 times a day & for symptoms of high or low blood sugar. 100 each 0   ??? levothyroxine (SYNTHROID) 50 MCG tablet Take one tablet by mouth every: Sun, Mon, Wed, Fri, Sat 90 tablet 3   ??? levothyroxine (SYNTHROID) 75 MCG tablet Take one tablet by mouth every Tuesday and Thursday. 90 tablet 0   ??? lidocaine (XYLOCAINE) 5 % ointment Apply to painful areas every 3-4 hours as needed 60 g 11   ??? lisinopriL (PRINIVIL,ZESTRIL) 40 MG tablet Take 1 tablet (40 mg total) by mouth daily. 30 tablet 0   ??? metroNIDAZOLE (METROGEL) 1 % gel Apply topically every hour as needed. (Patient not taking: Reported on 10/30/2021)     ??? multivitamin capsule Take 1 capsule by mouth daily.     ??? nystatin (MYCOSTATIN) 100,000 unit/gram powder Apply 1 application topically Two (2) times a day.  30 g 0   ??? pen needle, diabetic 32 gauge x 5/32 (4 mm) Ndle Use with insulin up to 4 times/day as needed. 100 each 0   ??? REMICADE 100 mg injection INFUSE 1,700MG  IV EVERY 4 WEEKS       No current facility-administered medications for this visit.       PHYSICAL EXAM:  Vitals:    01/15/22 1107   BP: 131/95   Pulse: 100   Temp: 36.4 ??C (97.5 ??F) CONSTITUTIONAL: Alert, in no acute distress  HEENT: Normocephalic, atraumatic  EYES: Anicteric sclerae, no conjunctival injection  CARDIOVASCULAR: Normal rate, rhythm sounds regular, normal S1/S2, no murmurs, clicks, rubs, gallops. Nonpitting BLE edema, trace.  PULM: Clear to auscultation bilaterally, normal work of breathing  GASTROINTESTINAL: Soft, obese  SKIN: No rashes  MSK: No obvious deformities  NEUROLOGIC: No focal deficits  PSYCH: appropriate      MEDICAL DECISION MAKING      Lab Results   Component Value Date    NA 132 (L) 12/25/2021    K 4.3 12/25/2021    CL 98 12/25/2021    CO2 21.3 12/25/2021    BUN 30 (H) 12/25/2021    CREATININE 1.08 (H) 12/25/2021    GFR >= 60 06/02/2012    ALBUMIN 2.5 (L) 10/30/2021     Lab Results   Component Value Date    WBC 9.5 11/23/2021    PLT 378 11/23/2021     Lab Results   Component Value Date    CALCIUM 9.2 12/25/2021     Urine sediment (07/26/21): fatty casts and granular debris

## 2022-01-17 DIAGNOSIS — L732 Hidradenitis suppurativa: Principal | ICD-10-CM

## 2022-01-17 NOTE — Unmapped (Signed)
Weight Management Services Financial Information Form    Hospital NPI: [id number]   Physicians NPI: [id number]     MRN: [id number]  Patient: Summer Russell    Insurance Company: Medicaid Managed Plan: [employer]  Telephone:  Policy/Subscriber ID:     Subscriber Information (if different from patient)  Name: n/a  Date of Birth:  n/a     Is Bariatric Surgery Covered: Yes  Covered Surgeries:   [phone]: Laparoscopic roux-en-y gastric bypass   [phone]: Laparoscopic gastric sleeve     **Completion of this form does not guarantee coverage, and is subject to change. The patient is instructed at the time of scheduling that they need to call their insurance company directly to confirm bariatric surgery coverage benefits**     Deductible Amount? n/a  Out of Pocket Amount? N/a  Is deductible applied to out of pocket amount? No  Once out of pocket is meet, what is the benefit coverage?  n/a  What is the speciality copay? $3    What requirements must be met for surgery to be approved:  Must meet medical necessity, 1 surgery per lifetime, Must have diagnoses of obesity, Pre Cert required     Physician Supervised Diet? No How Long? n/a  Does it have to be consecutive? No  Does it have to be done by PCP or can it be by a dietitian?  Either   How recent does it have to be? (past , , etc.) within past year  Do I need a weight history? (16yr, 6yrs, 65yrs) Yes  Do I need a clearance/referral letter from a doctor? Yes   If yes, does it have to come from PCP? Yes  Are nutrition consultations a covered benefit? Yes  (List next to visit type max number of visits, if applicable)  [id number]: 15, 30, 45, 60 min preventive   [id number]: Initial 1:1 nutrition counseling   [phone]: Follow up 1:1 nutrition counseling   [id number]: Group MNT nutrition counseling   [id number]: IBT Individual  [id number]: IBT Group

## 2022-01-23 ENCOUNTER — Ambulatory Visit: Admit: 2022-01-23 | Discharge: 2022-01-23 | Payer: BLUE CROSS/BLUE SHIELD

## 2022-01-23 DIAGNOSIS — N182 Chronic kidney disease, stage 2 (mild): Principal | ICD-10-CM

## 2022-01-23 LAB — BASIC METABOLIC PANEL
ANION GAP: 12 mmol/L (ref 5–14)
BLOOD UREA NITROGEN: 41 mg/dL — ABNORMAL HIGH (ref 9–23)
BUN / CREAT RATIO: 34
CALCIUM: 9.3 mg/dL (ref 8.7–10.4)
CHLORIDE: 97 mmol/L — ABNORMAL LOW (ref 98–107)
CO2: 21.2 mmol/L (ref 20.0–31.0)
CREATININE: 1.21 mg/dL — ABNORMAL HIGH
EGFR CKD-EPI (2021) FEMALE: 56 mL/min/{1.73_m2} — ABNORMAL LOW (ref >=60)
GLUCOSE RANDOM: 354 mg/dL — ABNORMAL HIGH (ref 70–99)
POTASSIUM: 4.9 mmol/L — ABNORMAL HIGH (ref 3.4–4.8)
SODIUM: 130 mmol/L — ABNORMAL LOW (ref 135–145)

## 2022-01-23 MED ADMIN — acetaminophen (TYLENOL) tablet 650 mg: 650 mg | ORAL | @ 17:00:00 | Stop: 2022-01-23

## 2022-01-23 MED ADMIN — cetirizine (ZyrTEC) tablet 10 mg: 10 mg | ORAL | @ 17:00:00 | Stop: 2022-01-23

## 2022-01-23 MED ADMIN — ondansetron (ZOFRAN-ODT) disintegrating tablet 8 mg: 8 mg | ORAL | @ 17:00:00 | Stop: 2022-01-23

## 2022-01-23 MED ADMIN — inFLIXimab-abda (RENFLEXIS) 10 mg/kg = 1,600 mg in sodium chloride (NS) 500 mL rapid infusion: 10 mg/kg | INTRAVENOUS | @ 17:00:00 | Stop: 2022-01-23

## 2022-01-23 MED ADMIN — heparin, porcine (PF) 100 unit/mL injection 500 Units: 500 [IU] | INTRAVENOUS | @ 19:00:00 | Stop: 2022-01-23

## 2022-01-23 NOTE — Unmapped (Signed)
Patient presents for accelerated Renflexis (infliximab-abda) infusion.  In no acute distress.  Vitals stable.  Reports no new medical issues or S/S of infection.  Right chest port accessed with 20 x 3/4 inch huber needle, brisk blood return noted.  See MAR for premeds.    1314 Renflexis (infliximab-abda) 1600 mg started to infuse as follows:     200 ml/hr x 15 min then 600 ml/hr for remainder of infusion.    1414 Renflexis (infliximab-abda) infusion complete.  Port flushed and heparin locked, dressing removed and bandaid applied to right chest site.  Vitals stable.  Patient without any s/s of adverse reaction.  Pt discharged in wheelchair with significant other.

## 2022-02-07 NOTE — Unmapped (Signed)
LVM for pt to r/s 10/2 appt (EW 7/5)

## 2022-02-07 NOTE — Unmapped (Signed)
Text & mychart msg sent for pt to r/s 10/2 appt

## 2022-02-12 NOTE — Unmapped (Signed)
Left 2nd voicemail- called to pt to r/s 10/2 appt with Sharyl Nimrod to another provider due to a template change

## 2022-02-13 NOTE — Unmapped (Signed)
Left 3rd voicemail- called to pt to r/s appt with Meredith to another provider due to a template change

## 2022-02-14 DIAGNOSIS — L732 Hidradenitis suppurativa: Principal | ICD-10-CM

## 2022-02-20 ENCOUNTER — Ambulatory Visit: Admit: 2022-02-20 | Discharge: 2022-02-21 | Payer: BLUE CROSS/BLUE SHIELD

## 2022-02-20 LAB — SEDIMENTATION RATE: ERYTHROCYTE SEDIMENTATION RATE: 39 mm/h — ABNORMAL HIGH (ref 0–20)

## 2022-02-20 LAB — CBC W/ AUTO DIFF
BASOPHILS ABSOLUTE COUNT: 0.1 10*9/L (ref 0.0–0.1)
BASOPHILS RELATIVE PERCENT: 2.5 %
EOSINOPHILS ABSOLUTE COUNT: 0.5 10*9/L (ref 0.0–0.5)
EOSINOPHILS RELATIVE PERCENT: 9.4 %
HEMATOCRIT: 34.8 % (ref 34.0–44.0)
HEMOGLOBIN: 12 g/dL (ref 11.3–14.9)
LYMPHOCYTES ABSOLUTE COUNT: 1.6 10*9/L (ref 1.1–3.6)
LYMPHOCYTES RELATIVE PERCENT: 33.7 %
MEAN CORPUSCULAR HEMOGLOBIN CONC: 34.6 g/dL (ref 32.0–36.0)
MEAN CORPUSCULAR HEMOGLOBIN: 30.7 pg (ref 25.9–32.4)
MEAN CORPUSCULAR VOLUME: 88.8 fL (ref 77.6–95.7)
MEAN PLATELET VOLUME: 9 fL (ref 6.8–10.7)
MONOCYTES ABSOLUTE COUNT: 0.5 10*9/L (ref 0.3–0.8)
MONOCYTES RELATIVE PERCENT: 9.7 %
NEUTROPHILS ABSOLUTE COUNT: 2.1 10*9/L (ref 1.8–7.8)
NEUTROPHILS RELATIVE PERCENT: 44.7 %
NUCLEATED RED BLOOD CELLS: 0 /100{WBCs} (ref <=4)
PLATELET COUNT: 303 10*9/L (ref 150–450)
RED BLOOD CELL COUNT: 3.92 10*12/L — ABNORMAL LOW (ref 3.95–5.13)
RED CELL DISTRIBUTION WIDTH: 13.4 % (ref 12.2–15.2)
WBC ADJUSTED: 4.8 10*9/L (ref 3.6–11.2)

## 2022-02-20 LAB — AST: AST (SGOT): 18 U/L (ref <=34)

## 2022-02-20 LAB — ALT: ALT (SGPT): 16 U/L (ref 10–49)

## 2022-02-20 LAB — C-REACTIVE PROTEIN: C-REACTIVE PROTEIN: 11 mg/L — ABNORMAL HIGH (ref <=10.0)

## 2022-02-20 LAB — BUN: BLOOD UREA NITROGEN: 28 mg/dL — ABNORMAL HIGH (ref 9–23)

## 2022-02-20 LAB — CREATININE
CREATININE: 1.2 mg/dL — ABNORMAL HIGH
EGFR CKD-EPI (2021) FEMALE: 57 mL/min/{1.73_m2} — ABNORMAL LOW (ref >=60)

## 2022-02-20 MED ADMIN — acetaminophen (TYLENOL) tablet 650 mg: 650 mg | ORAL | @ 17:00:00 | Stop: 2022-02-20

## 2022-02-20 MED ADMIN — ondansetron (ZOFRAN-ODT) disintegrating tablet 8 mg: 8 mg | ORAL | @ 17:00:00 | Stop: 2022-02-20

## 2022-02-20 MED ADMIN — heparin, porcine (PF) 100 unit/mL injection 500 Units: 500 [IU] | INTRAVENOUS | @ 19:00:00 | Stop: 2022-02-20

## 2022-02-20 MED ADMIN — inFLIXimab-abda (RENFLEXIS) 10 mg/kg = 1,600 mg in sodium chloride (NS) 500 mL rapid infusion: 10 mg/kg | INTRAVENOUS | @ 18:00:00 | Stop: 2022-02-20

## 2022-02-20 MED ADMIN — cetirizine (ZyrTEC) tablet 10 mg: 10 mg | ORAL | @ 17:00:00 | Stop: 2022-02-20

## 2022-02-20 NOTE — Unmapped (Signed)
Patient presents for accelerated Renflexis (infliximab-abda) infusion.  In no acute distress.  Vitals stable.  Reports no new medical issues or S/S of infection.  IV started.  See MAR for premeds.    1349 Renflexis (infliximab-abda) 1600 mg started to infuse as follows:     200 ml/hr x 15 min then 600 ml/hr for remainder of infusion.    1453 Renflexis (infliximab-abda) infusion complete.  PIV flushed with NS.  Vitals stable.  Patient without any s/s of adverse reaction.    IV d/c'd.  Patient discharged from Infusion Center.

## 2022-03-03 DIAGNOSIS — L732 Hidradenitis suppurativa: Principal | ICD-10-CM

## 2022-03-03 MED ORDER — FINASTERIDE 5 MG TABLET
ORAL_TABLET | 3 refills | 0 days
Start: 2022-03-03 — End: ?

## 2022-03-05 MED ORDER — FINASTERIDE 5 MG TABLET
ORAL_TABLET | 3 refills | 0 days
Start: 2022-03-05 — End: ?

## 2022-03-05 NOTE — Unmapped (Signed)
Appointment on 08/07 with Dr  Chrissie Noa,  Toni Amend

## 2022-03-05 NOTE — Unmapped (Signed)
Patient needs a appointment for refills

## 2022-03-12 ENCOUNTER — Ambulatory Visit: Admit: 2022-03-12 | Discharge: 2022-03-13 | Payer: BLUE CROSS/BLUE SHIELD

## 2022-03-12 DIAGNOSIS — L732 Hidradenitis suppurativa: Principal | ICD-10-CM

## 2022-03-12 DIAGNOSIS — L409 Psoriasis, unspecified: Principal | ICD-10-CM

## 2022-03-12 DIAGNOSIS — Z79899 Other long term (current) drug therapy: Principal | ICD-10-CM

## 2022-03-12 MED ORDER — APREMILAST 30 MG TABLET
ORAL_TABLET | Freq: Two times a day (BID) | ORAL | 2 refills | 90.00000 days | Status: CP
Start: 2022-03-12 — End: ?

## 2022-03-12 MED ORDER — APREMILAST 10 MG (4)-20 MG (4)-30 MG (47) TABLETS IN A DOSE PACK
ORAL_TABLET | ORAL | 0 refills | 0.00000 days | Status: CP
Start: 2022-03-12 — End: ?

## 2022-03-12 MED ORDER — CLOBETASOL 0.05 % TOPICAL OINTMENT
OPHTHALMIC | 2 refills | 0.00000 days | Status: CP
Start: 2022-03-12 — End: ?

## 2022-03-12 NOTE — Unmapped (Deleted)
Self-reported severity (0-5): 2  VAS pain today: 7  VAS average pain for the last month: 6  Requiring pain medication? No.  If so, what type/frequency? N/A  How often in pain?  continuously  Level of odor (0-5): 2  Level of itching (0-5): 2  Dressing changes needed for drainage:Once a day  How much drainage: a little drainage  Flare in the last month (Y/N)? No.  How long ago was the last flare? in last 6 months  Developing new lesions? once a month  Number of inflammatory lesions montly: 1-3  DLQI: 24  Current treatment: Infusion and birth control     How helpful is the current treatment in managing the following aspects of your disease?  Not at all helpful Somewhat helpful Very helpful   Pain  X    Decreasing length of flares   X   Decreasing new lesions   X   Drainage    X    Decreasing frequency of flares    X    Decreasing severity of flares    X    Odor    X

## 2022-03-12 NOTE — Unmapped (Signed)
Dermatology Note     Assessment and Plan:      Hidradenitis Suppurativa, [place] 3 -  near patient goal, on remicade and oral medications  - We discussed the typical natural history, pathogenesis, treatment options, and expected course as well as the relapsing and sometimes recalcitrant nature of the disease.    - Discussed option of small surgical procedure in the future, but patient is satisfied with current regiment.  - Patient deferred HS exam today due to mobility concerns  - Based on discussion above and r/b/a reviewed, mutual decision to proceed with current regimen as follows:  - Continue infliximab 10mg /kg q4 weeks.  Discussed risks of tuberculosis, other uncommon infections, theoretical risk of malignancies, and other uncommon side effects. Could consider increasing frequency again in the future if she continues to flare around the 4wk mark  - Continue finasteride 5mg  once daily  - Continue clindamycin 300mg  twice daily     High Risk Medication Use (Infliximab)  - Monitoring labs reviewed, acceptable to continue.  Continue to monitor every 8 weeks.  - Quant gold negative 10/2020; re-ordered as part of next infusion set    Psoriasis, poorly-controlled - BSA 20%  - Diagnosis, treatment options, prognosis, risk/ benefit, and side effects of treatment were discussed with the patient.   - Patient reported forgetting to take Cosentyx injections and that oral options for psoriasis treatments are more manageable. Jointly elected to proceed with apremilast (as below).   - Resume clobetasol 0.05% ointment applied to affected areas twice daily as needed. Reviewed to use ointments and not creams that patient has at home so that application won't burn.   - Start apremilast 10 mg (4)-20 mg (4)-30 mg (47) DsPk; take as per package instructions  - Start apremilast 30 mg tablets; take one tablet by mouth twice daily     The patient was advised to call for an appointment should any new, changing, or symptomatic lesions develop.     RTC: Return in about 4 months (around 07/12/2022). or sooner as needed   _________________________________________________________________      Chief Complaint     Follow-up of HS and psoriasis    HPI     Summer Russell is a 46 y.o. female who presents as a returning patient (last seen by Dr.Sayed on 01/16/2021) to [facility] for follow-up of hidradenitis suppurativa and psoriasis. At last visit, patient was to continue infliximab 10 mg/kg infusions every 4 weeks, finasteride 5 mg daily, and clindamycin 300 mg twice daily, and start Cosentyx 300 mg every 14 days (after loading doses) for treatment of HS.     Today, patient reports that she received the Cosentyx but would forget to take it and therefore has not been using it. She also endorses HS flares that coincide with menstrual cycles, but that overall her HS is manageable on the infusions. She mainly has flares on the buttocks.     Next, she endorses psoriasis flares on the legs and arms. She used the triamcinolone cream on these areas but it burned so she stopped using it. She again notes she did not use Cosentyx for psoriasis either because she forgets to give the injections.       Self-reported severity (0-5): 2  VAS pain today: 7  VAS average pain for the last month: 6  Requiring pain medication? No.  If so, what type/frequency? N/A  How often in pain?  continuously  Level of odor (0-5): 2  Level of itching (0-5):  2  Dressing changes needed for drainage:Once a day  How much drainage: a little drainage  Flare in the last month (Y/N)? No.  How long ago was the last flare? in last 6 months  Developing new lesions? once a month  Number of inflammatory lesions montly: 1-3  DLQI: 24  Current treatment: Infusion and birth control     How helpful is the current treatment in managing the following aspects of your disease?  Not at all helpful Somewhat helpful Very helpful   Pain   X     Decreasing length of flares     X   Decreasing new lesions X   Drainage     X     Decreasing frequency of flares     X     Decreasing severity of flares     X     Odor     X            The patient denies any other new or changing lesions or areas of concern.     Pertinent Past Medical History     No history of skin cancer    Disease course:  Year when symptoms first noticed: age 46  Year of diagnosis: age 77  Who diagnosed you? other ER Doctor  Location of first symptoms: axillae  Typical involved areas include: groin, axillae, inframammary, inner thighs and buttocks  Typical number of inflammatory lesions each month at baseline (from first visit): 5-10  Disease triggers: stress, sweat, heat, exercise and menstrual cycle     Are menstrual cycles irregular when not on birth control? Yes.  Current form of contraception: no  Effect of hormonal contraception on disease: no change  Flaring with menstrual cycle (before, during, or after?): before  Difficulty becoming pregnancy? Yes.  Pregnancy complications? miscarriage  How many children? 0  Better or worse with pregnancy?  never pregnant.  If so, worse during a specific trimester? never pregnant       Prior treatments:  Topical: dovonex  Systemic: antibiotics (oral and IV), humira, Remicade  Past surgical procedures: groin, scalp  Past laser procedures: none    Family History:   Negative for melanoma    Past Medical History, Family History, Social History, Medication List, Allergies, and Problem List were reviewed in the rooming section of Epic.     ROS: Other than symptoms mentioned in the HPI, no fevers, chills, or other skin complaints    Physical Examination     Gen: Well-appearing patient, appropriate, interactive, in no acute distress  Skin: Examination of the scalp, face, neck,  bilateral upper and lower extremities, hands, palms, soles, nails, performed today and pertinent for:     - HS exam deferred today due to mobility concerns     - scaly erythematous plaques on the bilateral lower legs and bilateral forearms, BSA 20%    Sites not commented on demonstrate normal findings.        (Approved Template 04/18/2020)

## 2022-03-14 NOTE — Unmapped (Signed)
Specialty Medication(s): Cosentyx    [name] has been dis-enrolled from the [facility] specialty pharmacy services due to multiple unsuccessful outreach attempts by the pharmacy/now change in therapy.    Additional information provided to the patient: per 8/7 derm note, patient prefers oral option. New order sent to [facility].     Tawanna Solo [facility] Specialty Pharmacist

## 2022-03-15 DIAGNOSIS — L732 Hidradenitis suppurativa: Principal | ICD-10-CM

## 2022-03-15 DIAGNOSIS — M797 Fibromyalgia: Principal | ICD-10-CM

## 2022-03-15 DIAGNOSIS — E1121 Type 2 diabetes mellitus with diabetic nephropathy: Principal | ICD-10-CM

## 2022-03-15 DIAGNOSIS — Z794 Long term (current) use of insulin: Principal | ICD-10-CM

## 2022-03-15 MED ORDER — FINASTERIDE 5 MG TABLET
ORAL_TABLET | Freq: Every day | ORAL | 3 refills | 90.00000 days | Status: CP
Start: 2022-03-15 — End: 2023-03-15

## 2022-03-15 MED ORDER — CLINDAMYCIN HCL 300 MG CAPSULE
ORAL_CAPSULE | Freq: Two times a day (BID) | ORAL | 6 refills | 30.00000 days | Status: CP
Start: 2022-03-15 — End: ?

## 2022-03-15 MED ORDER — RYBELSUS 3 MG TABLET
ORAL_TABLET | Freq: Every day | ORAL | 1 refills | 0 days | Status: CP
Start: 2022-03-15 — End: ?

## 2022-03-19 MED ORDER — FINASTERIDE 5 MG TABLET
ORAL_TABLET | Freq: Every day | ORAL | 3 refills | 90.00000 days | Status: CP
Start: 2022-03-19 — End: 2023-03-19

## 2022-03-19 MED ORDER — CLOBETASOL 0.05 % TOPICAL OINTMENT
OPHTHALMIC | 2 refills | 0.00000 days | Status: CP
Start: 2022-03-19 — End: ?

## 2022-03-19 MED ORDER — CLINDAMYCIN HCL 300 MG CAPSULE
ORAL_CAPSULE | Freq: Two times a day (BID) | ORAL | 5 refills | 30.00000 days | Status: CP
Start: 2022-03-19 — End: ?

## 2022-03-19 MED ORDER — APREMILAST 10 MG (4)-20 MG (4)-30 MG (47) TABLETS IN A DOSE PACK
ORAL_TABLET | 0 refills | 0 days | Status: CP
Start: 2022-03-19 — End: ?

## 2022-03-19 MED ORDER — APREMILAST 30 MG TABLET
ORAL_TABLET | Freq: Two times a day (BID) | ORAL | 2 refills | 90 days | Status: CP
Start: 2022-03-19 — End: ?

## 2022-03-20 ENCOUNTER — Ambulatory Visit: Admit: 2022-03-20 | Discharge: 2022-03-21 | Payer: BLUE CROSS/BLUE SHIELD

## 2022-03-20 DIAGNOSIS — L409 Psoriasis, unspecified: Principal | ICD-10-CM

## 2022-04-02 ENCOUNTER — Ambulatory Visit: Admit: 2022-04-02 | Discharge: 2022-04-03 | Payer: BLUE CROSS/BLUE SHIELD

## 2022-04-02 DIAGNOSIS — Z794 Long term (current) use of insulin: Principal | ICD-10-CM

## 2022-04-02 DIAGNOSIS — E1165 Type 2 diabetes mellitus with hyperglycemia: Principal | ICD-10-CM

## 2022-04-02 DIAGNOSIS — E1121 Type 2 diabetes mellitus with diabetic nephropathy: Principal | ICD-10-CM

## 2022-04-02 DIAGNOSIS — E039 Hypothyroidism, unspecified: Principal | ICD-10-CM

## 2022-04-02 MED ORDER — RYBELSUS 7 MG TABLET
ORAL_TABLET | Freq: Every day | ORAL | 11 refills | 0 days | Status: CP
Start: 2022-04-02 — End: ?

## 2022-04-02 MED ORDER — LISINOPRIL 40 MG TABLET
ORAL_TABLET | Freq: Every day | ORAL | 0 refills | 30 days | Status: CP
Start: 2022-04-02 — End: 2022-05-02

## 2022-04-04 ENCOUNTER — Ambulatory Visit
Admit: 2022-04-04 | Discharge: 2022-04-05 | Payer: BLUE CROSS/BLUE SHIELD | Attending: Physician Assistant | Primary: Physician Assistant

## 2022-04-04 ENCOUNTER — Ambulatory Visit: Admit: 2022-04-04 | Discharge: 2022-04-05 | Payer: BLUE CROSS/BLUE SHIELD

## 2022-04-04 DIAGNOSIS — Z0389 Encounter for observation for other suspected diseases and conditions ruled out: Principal | ICD-10-CM

## 2022-04-04 DIAGNOSIS — G4733 Obstructive sleep apnea (adult) (pediatric): Principal | ICD-10-CM

## 2022-04-04 DIAGNOSIS — E669 Obesity, unspecified: Principal | ICD-10-CM

## 2022-04-12 DIAGNOSIS — L732 Hidradenitis suppurativa: Principal | ICD-10-CM

## 2022-04-18 ENCOUNTER — Ambulatory Visit: Admit: 2022-04-18 | Discharge: 2022-04-19 | Payer: BLUE CROSS/BLUE SHIELD

## 2022-05-16 ENCOUNTER — Ambulatory Visit: Admit: 2022-05-16 | Discharge: 2022-05-17 | Payer: BLUE CROSS/BLUE SHIELD

## 2022-06-08 DIAGNOSIS — L732 Hidradenitis suppurativa: Principal | ICD-10-CM

## 2022-06-08 MED ORDER — CLINDAMYCIN HCL 300 MG CAPSULE
ORAL_CAPSULE | Freq: Two times a day (BID) | ORAL | 5 refills | 30.00000 days | Status: CN
Start: 2022-06-08 — End: ?

## 2022-06-08 MED ORDER — RIFAMPIN 300 MG CAPSULE
ORAL_CAPSULE | Freq: Two times a day (BID) | ORAL | 2 refills | 30.00000 days | Status: CP
Start: 2022-06-08 — End: ?

## 2022-06-08 MED ORDER — TRAMADOL 50 MG TABLET
ORAL_TABLET | Freq: Four times a day (QID) | ORAL | 0 refills | 4.00000 days | Status: CP
Start: 2022-06-08 — End: ?

## 2022-06-11 MED ORDER — CLINDAMYCIN HCL 300 MG CAPSULE
ORAL_CAPSULE | Freq: Two times a day (BID) | ORAL | 5 refills | 30.00000 days | Status: CP
Start: 2022-06-11 — End: ?

## 2022-06-13 ENCOUNTER — Ambulatory Visit: Admit: 2022-06-13 | Discharge: 2022-06-14 | Payer: BLUE CROSS/BLUE SHIELD

## 2022-06-22 MED ORDER — EMPAGLIFLOZIN 25 MG TABLET
ORAL_TABLET | Freq: Every day | ORAL | 3 refills | 90 days | Status: CP
Start: 2022-06-22 — End: 2023-06-17

## 2022-06-22 MED ORDER — INSULIN LISPRO (U-100) 100 UNIT/ML SUBCUTANEOUS PEN
Freq: Three times a day (TID) | SUBCUTANEOUS | 1 refills | 40 days | Status: CP
Start: 2022-06-22 — End: 2022-07-22

## 2022-06-22 MED ORDER — ACCU-CHEK GUIDE TEST STRIPS
0 refills | 0 days | Status: CP
Start: 2022-06-22 — End: ?

## 2022-06-22 MED ORDER — INSULIN GLARGINE (U-100) 100 UNIT/ML (3 ML) SUBCUTANEOUS PEN
Freq: Two times a day (BID) | SUBCUTANEOUS | 3 refills | 82 days | Status: CP
Start: 2022-06-22 — End: 2023-05-15

## 2022-07-06 DIAGNOSIS — L732 Hidradenitis suppurativa: Principal | ICD-10-CM

## 2022-07-24 ENCOUNTER — Ambulatory Visit: Admit: 2022-07-24 | Discharge: 2022-07-25 | Payer: BLUE CROSS/BLUE SHIELD

## 2022-08-17 DIAGNOSIS — L732 Hidradenitis suppurativa: Principal | ICD-10-CM

## 2022-08-30 ENCOUNTER — Ambulatory Visit: Admit: 2022-08-30 | Discharge: 2022-08-31 | Payer: BLUE CROSS/BLUE SHIELD

## 2022-09-03 ENCOUNTER — Ambulatory Visit: Admit: 2022-09-03 | Discharge: 2022-09-04 | Payer: BLUE CROSS/BLUE SHIELD

## 2022-09-03 DIAGNOSIS — L409 Psoriasis, unspecified: Principal | ICD-10-CM

## 2022-09-03 DIAGNOSIS — L732 Hidradenitis suppurativa: Principal | ICD-10-CM

## 2022-09-03 DIAGNOSIS — Z79899 Other long term (current) drug therapy: Principal | ICD-10-CM

## 2022-09-05 ENCOUNTER — Ambulatory Visit: Payer: Medicaid Other | Admitting: Internal Medicine

## 2022-09-05 DIAGNOSIS — Z91199 Patient's noncompliance with other medical treatment and regimen due to unspecified reason: Secondary | ICD-10-CM

## 2022-09-05 NOTE — Progress Notes (Signed)
No show for appointment. Office will call to reschedule.  

## 2022-09-23 DIAGNOSIS — L732 Hidradenitis suppurativa: Principal | ICD-10-CM

## 2022-09-26 ENCOUNTER — Ambulatory Visit: Admit: 2022-09-26 | Discharge: 2022-09-27 | Payer: BLUE CROSS/BLUE SHIELD

## 2022-10-11 ENCOUNTER — Ambulatory Visit: Admit: 2022-10-11 | Discharge: 2022-10-12 | Payer: BLUE CROSS/BLUE SHIELD

## 2022-10-11 ENCOUNTER — Ambulatory Visit: Admit: 2022-10-11 | Discharge: 2022-10-12 | Payer: BLUE CROSS/BLUE SHIELD | Attending: Nephrology | Primary: Nephrology

## 2022-10-11 DIAGNOSIS — R809 Proteinuria, unspecified: Principal | ICD-10-CM

## 2022-10-24 ENCOUNTER — Ambulatory Visit: Admit: 2022-10-24 | Discharge: 2022-10-25 | Payer: BLUE CROSS/BLUE SHIELD

## 2022-11-16 DIAGNOSIS — L732 Hidradenitis suppurativa: Principal | ICD-10-CM

## 2022-11-21 ENCOUNTER — Ambulatory Visit: Admit: 2022-11-21 | Discharge: 2022-11-22 | Payer: BLUE CROSS/BLUE SHIELD

## 2022-11-30 MED ORDER — DROSPIRENONE 3 MG-ETHINYL ESTRADIOL 0.02 MG TABLET
ORAL_TABLET | Freq: Every day | ORAL | 3 refills | 84.00000 days
Start: 2022-11-30 — End: 2023-11-30

## 2022-12-03 MED ORDER — DROSPIRENONE 3 MG-ETHINYL ESTRADIOL 0.02 MG TABLET
ORAL_TABLET | Freq: Every day | ORAL | 3 refills | 84.00000 days | Status: CP
Start: 2022-12-03 — End: 2023-12-03

## 2022-12-14 DIAGNOSIS — L732 Hidradenitis suppurativa: Principal | ICD-10-CM

## 2022-12-25 ENCOUNTER — Ambulatory Visit: Admit: 2022-12-25 | Discharge: 2022-12-25 | Payer: BLUE CROSS/BLUE SHIELD

## 2023-01-01 DIAGNOSIS — R609 Edema, unspecified: Principal | ICD-10-CM

## 2023-01-01 MED ORDER — BUMETANIDE 1 MG TABLET
ORAL_TABLET | 3 refills | 0 days | Status: CP
Start: 2023-01-01 — End: ?

## 2023-01-17 DIAGNOSIS — L732 Hidradenitis suppurativa: Principal | ICD-10-CM

## 2023-01-22 ENCOUNTER — Ambulatory Visit: Admit: 2023-01-22 | Discharge: 2023-01-23 | Payer: BLUE CROSS/BLUE SHIELD

## 2023-01-22 DIAGNOSIS — L732 Hidradenitis suppurativa: Principal | ICD-10-CM

## 2023-02-05 DIAGNOSIS — N182 Chronic kidney disease, stage 2 (mild): Principal | ICD-10-CM

## 2023-02-05 DIAGNOSIS — Z794 Long term (current) use of insulin: Principal | ICD-10-CM

## 2023-02-05 DIAGNOSIS — E1121 Type 2 diabetes mellitus with diabetic nephropathy: Principal | ICD-10-CM

## 2023-02-05 MED ORDER — GLIPIZIDE 5 MG TABLET
ORAL_TABLET | Freq: Every day | ORAL | 0 refills | 90 days | Status: CP
Start: 2023-02-05 — End: ?

## 2023-02-05 MED ORDER — SPIRONOLACTONE 25 MG TABLET
ORAL_TABLET | Freq: Every day | ORAL | 0 refills | 90 days | Status: CP
Start: 2023-02-05 — End: ?

## 2023-02-13 DIAGNOSIS — L732 Hidradenitis suppurativa: Principal | ICD-10-CM

## 2023-02-19 ENCOUNTER — Ambulatory Visit: Admit: 2023-02-19 | Discharge: 2023-02-20 | Payer: BLUE CROSS/BLUE SHIELD

## 2023-02-19 DIAGNOSIS — L732 Hidradenitis suppurativa: Principal | ICD-10-CM

## 2023-02-25 MED ORDER — ACCU-CHEK GUIDE TEST STRIPS
ORAL_STRIP | 0 refills | 0 days
Start: 2023-02-25 — End: ?

## 2023-02-27 MED ORDER — ACCU-CHEK GUIDE TEST STRIPS
ORAL_STRIP | 0 refills | 0 days | Status: CP
Start: 2023-02-27 — End: ?

## 2023-03-14 DIAGNOSIS — L732 Hidradenitis suppurativa: Principal | ICD-10-CM

## 2023-03-19 ENCOUNTER — Ambulatory Visit: Admit: 2023-03-19 | Discharge: 2023-03-20 | Payer: BLUE CROSS/BLUE SHIELD

## 2023-03-19 DIAGNOSIS — L732 Hidradenitis suppurativa: Principal | ICD-10-CM

## 2023-04-11 DIAGNOSIS — L732 Hidradenitis suppurativa: Principal | ICD-10-CM

## 2023-04-11 MED ORDER — CLINDAMYCIN HCL 300 MG CAPSULE
ORAL_CAPSULE | ORAL | 5 refills | 0.00000 days
Start: 2023-04-11 — End: ?

## 2023-04-12 MED ORDER — CLINDAMYCIN HCL 300 MG CAPSULE
ORAL_CAPSULE | ORAL | 5 refills | 0.00000 days | Status: CP
Start: 2023-04-12 — End: ?

## 2023-04-23 ENCOUNTER — Ambulatory Visit: Admit: 2023-04-23 | Discharge: 2023-04-24 | Payer: BLUE CROSS/BLUE SHIELD

## 2023-04-23 DIAGNOSIS — L732 Hidradenitis suppurativa: Principal | ICD-10-CM

## 2023-04-23 MED ORDER — HYDROCODONE 5 MG-ACETAMINOPHEN 325 MG TABLET
ORAL_TABLET | ORAL | 0 refills | 0.00000 days | Status: CP
Start: 2023-04-23 — End: ?

## 2023-04-23 MED ORDER — CLINDAMYCIN HCL 300 MG CAPSULE
ORAL_CAPSULE | Freq: Two times a day (BID) | ORAL | 5 refills | 30.00000 days | Status: CP
Start: 2023-04-23 — End: ?

## 2023-05-16 DIAGNOSIS — L732 Hidradenitis suppurativa: Principal | ICD-10-CM

## 2023-05-22 ENCOUNTER — Ambulatory Visit: Admit: 2023-05-22 | Discharge: 2023-05-23 | Payer: BLUE CROSS/BLUE SHIELD

## 2023-05-27 ENCOUNTER — Telehealth: Admit: 2023-05-27 | Discharge: 2023-05-28 | Payer: BLUE CROSS/BLUE SHIELD

## 2023-05-27 DIAGNOSIS — Z79899 Other long term (current) drug therapy: Principal | ICD-10-CM

## 2023-05-27 DIAGNOSIS — L732 Hidradenitis suppurativa: Principal | ICD-10-CM

## 2023-05-27 DIAGNOSIS — L659 Nonscarring hair loss, unspecified: Principal | ICD-10-CM

## 2023-05-27 DIAGNOSIS — L409 Psoriasis, unspecified: Principal | ICD-10-CM

## 2023-05-27 MED ORDER — PREDNISONE 20 MG TABLET
ORAL_TABLET | ORAL | 0 refills | 0.00000 days | Status: CP
Start: 2023-05-27 — End: ?

## 2023-05-27 MED ORDER — CLOBETASOL 0.05 % SCALP SOLUTION
Freq: Two times a day (BID) | TOPICAL | 5 refills | 0.00000 days | Status: CP
Start: 2023-05-27 — End: ?

## 2023-06-14 DIAGNOSIS — L732 Hidradenitis suppurativa: Principal | ICD-10-CM

## 2023-06-19 ENCOUNTER — Ambulatory Visit: Admit: 2023-06-19 | Discharge: 2023-06-20 | Payer: BLUE CROSS/BLUE SHIELD

## 2023-07-09 MED ORDER — HYDROCODONE 5 MG-ACETAMINOPHEN 325 MG TABLET
ORAL_TABLET | 0 refills | 0 days | Status: CP
Start: 2023-07-09 — End: ?

## 2023-07-11 DIAGNOSIS — L732 Hidradenitis suppurativa: Principal | ICD-10-CM

## 2023-07-15 DIAGNOSIS — L732 Hidradenitis suppurativa: Principal | ICD-10-CM

## 2023-07-15 MED ORDER — FINASTERIDE 5 MG TABLET
ORAL_TABLET | Freq: Every day | ORAL | 3 refills | 90.00 days | Status: CP
Start: 2023-07-15 — End: 2024-07-14

## 2023-08-08 DIAGNOSIS — L732 Hidradenitis suppurativa: Principal | ICD-10-CM

## 2023-08-14 ENCOUNTER — Encounter: Admit: 2023-08-14 | Discharge: 2023-08-14 | Payer: BLUE CROSS/BLUE SHIELD

## 2023-08-16 DIAGNOSIS — L732 Hidradenitis suppurativa: Principal | ICD-10-CM

## 2023-08-16 MED ORDER — HYDROCODONE 5 MG-ACETAMINOPHEN 325 MG TABLET
ORAL_TABLET | 0 refills | 0.00 days | Status: CP
Start: 2023-08-16 — End: ?

## 2023-08-16 MED ORDER — NYSTATIN 100,000 UNIT/GRAM TOPICAL POWDER
Freq: Four times a day (QID) | TOPICAL | 5 refills | 30.00 days | Status: CP
Start: 2023-08-16 — End: 2024-08-15

## 2023-08-16 MED ORDER — CEFDINIR 300 MG CAPSULE
ORAL_CAPSULE | 2 refills | 0.00 days | Status: CP
Start: 2023-08-16 — End: ?

## 2023-08-17 DIAGNOSIS — L732 Hidradenitis suppurativa: Principal | ICD-10-CM

## 2023-08-17 MED ORDER — RIFAMPIN 300 MG CAPSULE
ORAL_CAPSULE | ORAL | 2 refills | 0.00 days
Start: 2023-08-17 — End: ?

## 2023-08-19 MED ORDER — RIFAMPIN 300 MG CAPSULE
ORAL_CAPSULE | ORAL | 2 refills | 0 days | Status: CP
Start: 2023-08-19 — End: ?

## 2023-09-08 DIAGNOSIS — L732 Hidradenitis suppurativa: Principal | ICD-10-CM

## 2023-09-11 ENCOUNTER — Encounter: Admit: 2023-09-11 | Discharge: 2023-09-12 | Payer: BLUE CROSS/BLUE SHIELD

## 2023-10-05 DIAGNOSIS — L732 Hidradenitis suppurativa: Principal | ICD-10-CM

## 2023-10-09 ENCOUNTER — Encounter: Admit: 2023-10-09 | Discharge: 2023-10-10 | Payer: BLUE CROSS/BLUE SHIELD

## 2023-10-16 ENCOUNTER — Ambulatory Visit
Admit: 2023-10-16 | Discharge: 2023-10-16 | Payer: BLUE CROSS/BLUE SHIELD | Attending: Student in an Organized Health Care Education/Training Program | Primary: Student in an Organized Health Care Education/Training Program

## 2023-10-16 ENCOUNTER — Ambulatory Visit: Admit: 2023-10-16 | Discharge: 2023-10-16 | Payer: BLUE CROSS/BLUE SHIELD

## 2023-10-16 DIAGNOSIS — N1832 Stage 3b chronic kidney disease (CKD) (CMS-HCC): Principal | ICD-10-CM

## 2023-11-04 MED ORDER — NIKKI (28) 3 MG-0.02 MG TABLET
ORAL_TABLET | Freq: Every day | ORAL | 3 refills | 84 days | Status: CP
Start: 2023-11-04 — End: ?

## 2023-11-06 ENCOUNTER — Encounter: Admit: 2023-11-06 | Discharge: 2023-11-07

## 2023-11-29 DIAGNOSIS — L732 Hidradenitis suppurativa: Principal | ICD-10-CM

## 2023-12-03 ENCOUNTER — Encounter: Admit: 2023-12-03 | Discharge: 2023-12-03 | Payer: Medicaid (Managed Care)

## 2023-12-03 ENCOUNTER — Other Ambulatory Visit: Admit: 2023-12-03 | Discharge: 2023-12-03 | Payer: Medicaid (Managed Care)

## 2023-12-03 DIAGNOSIS — N1832 Stage 3b chronic kidney disease (CKD) (CMS-HCC): Principal | ICD-10-CM

## 2023-12-18 DIAGNOSIS — N184 Chronic kidney disease, stage 4 (severe): Principal | ICD-10-CM

## 2023-12-31 ENCOUNTER — Encounter: Admit: 2023-12-31 | Discharge: 2024-01-01 | Payer: Medicaid (Managed Care)

## 2023-12-31 DIAGNOSIS — L732 Hidradenitis suppurativa: Principal | ICD-10-CM

## 2023-12-31 DIAGNOSIS — N184 Chronic kidney disease, stage 4 (severe): Principal | ICD-10-CM

## 2024-01-01 ENCOUNTER — Ambulatory Visit
Admit: 2024-01-01 | Discharge: 2024-01-02 | Payer: Medicaid (Managed Care) | Attending: Student in an Organized Health Care Education/Training Program | Primary: Student in an Organized Health Care Education/Training Program

## 2024-01-01 DIAGNOSIS — N184 Chronic kidney disease, stage 4 (severe): Principal | ICD-10-CM

## 2024-01-01 DIAGNOSIS — I10 Essential (primary) hypertension: Principal | ICD-10-CM

## 2024-01-01 DIAGNOSIS — I158 Other secondary hypertension: Principal | ICD-10-CM

## 2024-01-01 MED ORDER — TELMISARTAN 80 MG TABLET
ORAL_TABLET | Freq: Every day | ORAL | 1 refills | 100.00000 days | Status: CP
Start: 2024-01-01 — End: 2025-02-04

## 2024-01-28 ENCOUNTER — Encounter: Admit: 2024-01-28 | Discharge: 2024-01-28 | Payer: Medicaid (Managed Care) | Attending: Nephrology | Primary: Nephrology

## 2024-01-28 ENCOUNTER — Encounter: Admit: 2024-01-28 | Discharge: 2024-01-28 | Payer: Medicaid (Managed Care)

## 2024-02-19 DIAGNOSIS — L732 Hidradenitis suppurativa: Principal | ICD-10-CM

## 2024-03-17 DIAGNOSIS — L732 Hidradenitis suppurativa: Principal | ICD-10-CM

## 2024-03-25 ENCOUNTER — Encounter: Admit: 2024-03-25 | Discharge: 2024-03-25 | Payer: Medicaid (Managed Care)

## 2024-04-17 DIAGNOSIS — L732 Hidradenitis suppurativa: Principal | ICD-10-CM

## 2024-04-22 ENCOUNTER — Encounter: Admit: 2024-04-22 | Discharge: 2024-04-22 | Payer: Medicaid (Managed Care)

## 2024-04-22 ENCOUNTER — Ambulatory Visit
Admit: 2024-04-22 | Discharge: 2024-04-22 | Payer: Medicaid (Managed Care) | Attending: Student in an Organized Health Care Education/Training Program | Primary: Student in an Organized Health Care Education/Training Program

## 2024-04-22 DIAGNOSIS — R609 Edema, unspecified: Principal | ICD-10-CM

## 2024-04-22 DIAGNOSIS — N184 Chronic kidney disease, stage 4 (severe): Principal | ICD-10-CM

## 2024-04-22 DIAGNOSIS — D649 Anemia, unspecified: Principal | ICD-10-CM

## 2024-04-22 MED ORDER — CARVEDILOL 6.25 MG TABLET
ORAL_TABLET | Freq: Two times a day (BID) | ORAL | 0 refills | 90.00000 days | Status: CP
Start: 2024-04-22 — End: 2024-04-22

## 2024-04-22 MED ORDER — BUMETANIDE 1 MG TABLET
ORAL_TABLET | Freq: Two times a day (BID) | ORAL | 3 refills | 90.00000 days | Status: CP
Start: 2024-04-22 — End: ?

## 2024-05-15 DIAGNOSIS — N185 Chronic kidney disease, stage 5: Principal | ICD-10-CM

## 2024-05-15 DIAGNOSIS — L732 Hidradenitis suppurativa: Principal | ICD-10-CM

## 2024-05-19 ENCOUNTER — Encounter: Admit: 2024-05-19 | Discharge: 2024-05-20 | Payer: Medicaid (Managed Care)

## 2024-05-27 ENCOUNTER — Ambulatory Visit
Admit: 2024-05-27 | Discharge: 2024-05-28 | Payer: Medicaid (Managed Care) | Attending: Student in an Organized Health Care Education/Training Program | Primary: Student in an Organized Health Care Education/Training Program

## 2024-05-27 DIAGNOSIS — N185 Chronic kidney disease, stage 5: Principal | ICD-10-CM

## 2024-05-27 DIAGNOSIS — I10 Essential (primary) hypertension: Principal | ICD-10-CM

## 2024-05-27 DIAGNOSIS — N189 Chronic kidney disease, unspecified: Principal | ICD-10-CM

## 2024-05-27 DIAGNOSIS — Z862 Personal history of diseases of the blood and blood-forming organs and certain disorders involving the immune mechanism: Principal | ICD-10-CM

## 2024-05-27 DIAGNOSIS — L732 Hidradenitis suppurativa: Principal | ICD-10-CM

## 2024-05-27 DIAGNOSIS — E279 Disorder of adrenal gland, unspecified: Principal | ICD-10-CM

## 2024-05-27 MED ORDER — GABAPENTIN 300 MG CAPSULE
ORAL_CAPSULE | Freq: Every day | ORAL | 0 refills | 30.00000 days | Status: CP
Start: 2024-05-27 — End: 2024-06-26

## 2024-05-27 MED ORDER — INSULIN GLARGINE (U-100) 100 UNIT/ML (3 ML) SUBCUTANEOUS PEN
Freq: Two times a day (BID) | SUBCUTANEOUS | 3 refills | 0.00000 days
Start: 2024-05-27 — End: 2025-04-19

## 2024-05-27 MED ORDER — TELMISARTAN 80 MG TABLET
ORAL_TABLET | Freq: Every day | ORAL | 1 refills | 200.00000 days
Start: 2024-05-27 — End: 2025-07-01

## 2024-05-27 MED ORDER — SODIUM BICARBONATE 650 MG TABLET
ORAL_TABLET | Freq: Two times a day (BID) | ORAL | 1 refills | 30.00000 days
Start: 2024-05-27 — End: ?

## 2024-05-27 MED ORDER — CETIRIZINE 10 MG TABLET
ORAL_TABLET | Freq: Every day | ORAL | 0 refills | 30.00000 days
Start: 2024-05-27 — End: ?

## 2024-05-27 MED ORDER — ATORVASTATIN 20 MG TABLET
ORAL_TABLET | Freq: Every day | ORAL | 0 refills | 30.00000 days | Status: CP
Start: 2024-05-27 — End: 2024-06-26

## 2024-05-29 ENCOUNTER — Encounter
Admission: EM | Admit: 2024-05-29 | Discharge: 2024-06-16 | Disposition: A | Discharge status: Rehabilitation Facility | Payer: Medicaid (Managed Care) | Attending: Anesthesiology | Admitting: Nephrology

## 2024-05-29 ENCOUNTER — Encounter: Admit: 2024-05-29 | Payer: Medicaid (Managed Care)

## 2024-05-29 ENCOUNTER — Inpatient Hospital Stay
Admission: EM | Admit: 2024-05-29 | Discharge: 2024-06-16 | Disposition: A | Discharge status: Rehabilitation Facility | Payer: Medicaid (Managed Care) | Admitting: Nephrology

## 2024-05-29 ENCOUNTER — Encounter: Admit: 2024-05-29 | Payer: Worker's Compensation

## 2024-05-29 ENCOUNTER — Encounter
Admission: EM | Admit: 2024-05-29 | Discharge: 2024-06-16 | Disposition: A | Discharge status: Rehabilitation Facility | Payer: Medicaid (Managed Care) | Admitting: Nephrology

## 2024-05-29 ENCOUNTER — Ambulatory Visit
Admission: EM | Admit: 2024-05-29 | Discharge: 2024-06-16 | Disposition: A | Discharge status: Rehabilitation Facility | Payer: Medicaid (Managed Care) | Admitting: Nephrology

## 2024-05-29 ENCOUNTER — Ambulatory Visit: Admit: 2024-05-29 | Payer: Worker's Compensation

## 2024-05-29 ENCOUNTER — Encounter
Admission: EM | Admit: 2024-05-29 | Discharge: 2024-06-16 | Disposition: A | Discharge status: Rehabilitation Facility | Payer: Worker's Compensation | Admitting: Nephrology

## 2024-06-08 MED ORDER — VANCOMYCIN 250 MG CAPSULE
ORAL_CAPSULE | Freq: Four times a day (QID) | ORAL | 0 refills | 5.00000 days | Status: CP
Start: 2024-06-08 — End: 2024-06-08

## 2024-06-15 MED ORDER — CETIRIZINE 10 MG TABLET
ORAL_TABLET | Freq: Every day | ORAL | 0 refills | 30.00000 days
Start: 2024-06-15 — End: ?

## 2024-06-15 MED ORDER — GABAPENTIN 300 MG CAPSULE
ORAL_CAPSULE | Freq: Every day | ORAL | 0 refills | 30.00000 days
Start: 2024-06-15 — End: 2024-07-15

## 2024-06-15 MED ORDER — FAMOTIDINE 10 MG TABLET
Freq: Two times a day (BID) | ORAL | 0 refills | 0.00000 days | PRN
Start: 2024-06-15 — End: 2024-09-13

## 2024-06-15 MED ORDER — ASPIRIN 81 MG TABLET,DELAYED RELEASE
ORAL_TABLET | Freq: Every day | ORAL | 0 refills | 30.00000 days
Start: 2024-06-15 — End: 2024-07-15

## 2024-06-15 MED ORDER — BUMETANIDE 1 MG TABLET
ORAL_TABLET | Freq: Every day | ORAL | 2 refills | 30.00000 days
Start: 2024-06-15 — End: 2024-09-13

## 2024-06-15 MED ORDER — CARVEDILOL 12.5 MG TABLET
ORAL_TABLET | Freq: Two times a day (BID) | ORAL | 0 refills | 90.00000 days
Start: 2024-06-15 — End: 2024-09-13

## 2024-06-15 MED ORDER — INSULIN GLARGINE (U-100) 100 UNIT/ML (3 ML) SUBCUTANEOUS PEN
Freq: Two times a day (BID) | SUBCUTANEOUS | 2 refills | 30.00000 days
Start: 2024-06-15 — End: 2024-09-13

## 2024-06-16 ENCOUNTER — Encounter
Admission: EM | Admit: 2024-06-16 | Discharge: 2024-06-29 | Disposition: A | Discharge status: Home / Self Care | Payer: Medicaid (Managed Care) | Source: Intra-hospital | Attending: Nephrology | Admitting: Spinal Cord Injury Medicine

## 2024-06-16 ENCOUNTER — Encounter
Admission: EM | Admit: 2024-06-16 | Payer: Medicaid (Managed Care) | Source: Intra-hospital | Attending: Nephrology | Admitting: Spinal Cord Injury Medicine

## 2024-06-16 ENCOUNTER — Ambulatory Visit: Admit: 2024-06-16 | Payer: Medicaid (Managed Care)

## 2024-06-16 ENCOUNTER — Inpatient Hospital Stay
Admission: EM | Admit: 2024-06-16 | Discharge: 2024-06-29 | Disposition: A | Discharge status: Home / Self Care | Payer: Medicaid (Managed Care) | Source: Intra-hospital | Admitting: Spinal Cord Injury Medicine

## 2024-06-16 ENCOUNTER — Ambulatory Visit
Admission: EM | Admit: 2024-06-16 | Payer: Medicaid (Managed Care) | Source: Intra-hospital | Admitting: Spinal Cord Injury Medicine

## 2024-06-16 ENCOUNTER — Ambulatory Visit
Admission: EM | Admit: 2024-06-16 | Discharge: 2024-06-29 | Payer: Medicaid (Managed Care) | Source: Intra-hospital | Admitting: Spinal Cord Injury Medicine

## 2024-06-16 ENCOUNTER — Ambulatory Visit
Admission: EM | Admit: 2024-06-16 | Discharge: 2024-06-29 | Disposition: A | Discharge status: Home / Self Care | Payer: Medicaid (Managed Care) | Source: Intra-hospital | Admitting: Spinal Cord Injury Medicine

## 2024-06-16 ENCOUNTER — Encounter
Admission: EM | Admit: 2024-06-16 | Discharge: 2024-06-29 | Payer: Medicaid (Managed Care) | Source: Intra-hospital | Attending: Nephrology | Admitting: Spinal Cord Injury Medicine

## 2024-06-16 MED ORDER — INSULIN GLARGINE (U-100) 100 UNIT/ML (3 ML) SUBCUTANEOUS PEN
Freq: Every evening | SUBCUTANEOUS | 2 refills | 30.00000 days | Status: SS
Start: 2024-06-16 — End: 2024-09-14

## 2024-06-16 MED ORDER — CARVEDILOL 12.5 MG TABLET
ORAL_TABLET | Freq: Two times a day (BID) | ORAL | 0 refills | 90.00000 days | Status: SS
Start: 2024-06-16 — End: 2024-09-14

## 2024-06-16 MED ORDER — LEVOTHYROXINE 75 MCG TABLET
ORAL_TABLET | Freq: Every day | ORAL | 0 refills | 90.00000 days | Status: SS
Start: 2024-06-16 — End: 2024-09-14

## 2024-06-16 MED ORDER — ASPIRIN 81 MG TABLET,DELAYED RELEASE
ORAL_TABLET | Freq: Every day | ORAL | 0 refills | 30.00000 days | Status: SS
Start: 2024-06-16 — End: 2024-07-16

## 2024-06-16 MED ORDER — FAMOTIDINE 10 MG TABLET
ORAL_TABLET | Freq: Two times a day (BID) | ORAL | 0 refills | 15.00000 days | Status: SS | PRN
Start: 2024-06-16 — End: 2024-09-14

## 2024-06-16 MED ORDER — BUMETANIDE 2 MG TABLET
ORAL_TABLET | Freq: Every day | ORAL | 2 refills | 30.00000 days | Status: SS
Start: 2024-06-16 — End: 2024-09-14

## 2024-06-16 MED ORDER — CETIRIZINE 10 MG TABLET
ORAL_TABLET | Freq: Every day | ORAL | 0 refills | 30.00000 days | Status: CN
Start: 2024-06-16 — End: ?

## 2024-06-16 MED ORDER — DOXYCYCLINE HYCLATE 100 MG TABLET
ORAL_TABLET | Freq: Two times a day (BID) | ORAL | 0 refills | 90.00000 days | Status: SS
Start: 2024-06-16 — End: ?

## 2024-06-16 MED ORDER — GABAPENTIN 300 MG CAPSULE
ORAL_CAPSULE | Freq: Every day | ORAL | 0 refills | 30.00000 days | Status: SS
Start: 2024-06-16 — End: 2024-07-16

## 2024-06-17 DIAGNOSIS — L409 Psoriasis, unspecified: Principal | ICD-10-CM

## 2024-06-17 DIAGNOSIS — E039 Hypothyroidism, unspecified: Principal | ICD-10-CM

## 2024-06-17 DIAGNOSIS — E8770 Fluid overload, unspecified: Principal | ICD-10-CM

## 2024-06-17 DIAGNOSIS — I890 Lymphedema, not elsewhere classified: Principal | ICD-10-CM

## 2024-06-17 DIAGNOSIS — L732 Hidradenitis suppurativa: Principal | ICD-10-CM

## 2024-06-17 DIAGNOSIS — I1311 Hypertensive heart and chronic kidney disease without heart failure, with stage 5 chronic kidney disease, or end stage renal disease: Principal | ICD-10-CM

## 2024-06-17 DIAGNOSIS — N185 Chronic kidney disease, stage 5: Principal | ICD-10-CM

## 2024-06-17 DIAGNOSIS — E1122 Type 2 diabetes mellitus with diabetic chronic kidney disease: Principal | ICD-10-CM

## 2024-06-20 DIAGNOSIS — I1311 Hypertensive heart and chronic kidney disease without heart failure, with stage 5 chronic kidney disease, or end stage renal disease: Principal | ICD-10-CM

## 2024-06-20 DIAGNOSIS — E039 Hypothyroidism, unspecified: Principal | ICD-10-CM

## 2024-06-20 DIAGNOSIS — N185 Chronic kidney disease, stage 5: Principal | ICD-10-CM

## 2024-06-20 DIAGNOSIS — E1122 Type 2 diabetes mellitus with diabetic chronic kidney disease: Principal | ICD-10-CM

## 2024-06-20 DIAGNOSIS — I890 Lymphedema, not elsewhere classified: Principal | ICD-10-CM

## 2024-06-20 DIAGNOSIS — E8770 Fluid overload, unspecified: Principal | ICD-10-CM

## 2024-06-20 DIAGNOSIS — L732 Hidradenitis suppurativa: Principal | ICD-10-CM

## 2024-06-20 DIAGNOSIS — L409 Psoriasis, unspecified: Principal | ICD-10-CM

## 2024-06-23 DIAGNOSIS — I890 Lymphedema, not elsewhere classified: Principal | ICD-10-CM

## 2024-06-23 DIAGNOSIS — I1311 Hypertensive heart and chronic kidney disease without heart failure, with stage 5 chronic kidney disease, or end stage renal disease: Principal | ICD-10-CM

## 2024-06-23 DIAGNOSIS — L732 Hidradenitis suppurativa: Principal | ICD-10-CM

## 2024-06-23 DIAGNOSIS — L409 Psoriasis, unspecified: Principal | ICD-10-CM

## 2024-06-23 DIAGNOSIS — E8770 Fluid overload, unspecified: Principal | ICD-10-CM

## 2024-06-23 DIAGNOSIS — E1122 Type 2 diabetes mellitus with diabetic chronic kidney disease: Principal | ICD-10-CM

## 2024-06-23 DIAGNOSIS — N185 Chronic kidney disease, stage 5: Principal | ICD-10-CM

## 2024-06-23 DIAGNOSIS — E039 Hypothyroidism, unspecified: Principal | ICD-10-CM

## 2024-06-25 MED ORDER — ATORVASTATIN 20 MG TABLET
ORAL_TABLET | Freq: Every day | ORAL | 0 refills | 30.00000 days | Status: CP
Start: 2024-06-25 — End: 2024-07-25
  Filled 2024-06-29: qty 60, 30d supply, fill #0

## 2024-06-25 MED ORDER — SEVELAMER CARBONATE 800 MG TABLET
ORAL_TABLET | Freq: Three times a day (TID) | ORAL | 0 refills | 30.00000 days | Status: CP
Start: 2024-06-25 — End: 2024-07-25

## 2024-06-25 MED ORDER — ACCU-CHEK GUIDE TEST STRIPS
ORAL_STRIP | SUBCUTANEOUS | 0 refills | 0.00000 days | Status: CP
Start: 2024-06-25 — End: ?
  Filled 2024-06-29: qty 100, 25d supply, fill #0

## 2024-06-25 MED ORDER — GLUCAGON 1 MG SOLUTION FOR INJECTION (COMBINED VIALS) WRAPPER
Freq: Once | INTRAMUSCULAR | 0 refills | 0.00000 days | Status: CP | PRN
Start: 2024-06-25 — End: ?
  Filled 2024-06-29: qty 1, 1d supply, fill #0

## 2024-06-25 MED ORDER — GLUCOSE 4 GRAM CHEWABLE TABLET
ORAL_TABLET | ORAL | 0 refills | 1.00000 days | Status: CP | PRN
Start: 2024-06-25 — End: 2025-06-25

## 2024-06-25 MED ORDER — FAMOTIDINE 10 MG TABLET
ORAL_TABLET | Freq: Two times a day (BID) | ORAL | 0 refills | 30.00000 days | Status: CP | PRN
Start: 2024-06-25 — End: 2024-09-23
  Filled 2024-06-29: qty 60, 30d supply, fill #0

## 2024-06-25 MED ORDER — OXYCODONE 5 MG TABLET
ORAL_TABLET | Freq: Three times a day (TID) | ORAL | 0 refills | 5.00000 days | Status: CP | PRN
Start: 2024-06-25 — End: 2024-06-30

## 2024-06-25 MED ORDER — POLYETHYLENE GLYCOL 3350 17 GRAM ORAL POWDER PACKET
PACK | Freq: Every day | ORAL | 0 refills | 30.00000 days | Status: CP | PRN
Start: 2024-06-25 — End: 2024-09-23
  Filled 2024-06-29: qty 30, 30d supply, fill #0

## 2024-06-25 MED ORDER — PEN NEEDLE, DIABETIC 32 GAUGE X 5/32" (4 MM)
ORAL | 0 refills | 0.00000 days | Status: CP
Start: 2024-06-25 — End: ?
  Filled 2024-06-29: qty 100, 25d supply, fill #0

## 2024-06-25 MED ORDER — PSYLLIUM HUSK 3.4 GRAM ORAL POWDER PACKET
PACK | Freq: Every day | ORAL | 0 refills | 30.00000 days | Status: CP
Start: 2024-06-25 — End: 2024-07-25

## 2024-06-25 MED ORDER — GABAPENTIN 300 MG CAPSULE
ORAL_CAPSULE | Freq: Every day | ORAL | 0 refills | 30.00000 days | Status: CP
Start: 2024-06-25 — End: 2024-07-25
  Filled 2024-06-29: qty 60, 30d supply, fill #0

## 2024-06-25 MED ORDER — ACETAMINOPHEN 500 MG TABLET
ORAL_TABLET | Freq: Three times a day (TID) | ORAL | 0 refills | 30.00000 days | Status: CP
Start: 2024-06-25 — End: ?
  Filled 2024-06-29: qty 180, 30d supply, fill #0

## 2024-06-25 MED ORDER — DOXYCYCLINE HYCLATE 100 MG TABLET
ORAL_TABLET | Freq: Two times a day (BID) | ORAL | 0 refills | 30.00000 days | Status: CP
Start: 2024-06-25 — End: ?
  Filled 2024-06-29: qty 60, 30d supply, fill #0

## 2024-06-25 MED ORDER — MAGNESIUM OXIDE-MAGNESIUM AMINO ACID CHELATE 133 MG TABLET
ORAL_TABLET | Freq: Two times a day (BID) | ORAL | 0 refills | 30.00000 days | Status: CP
Start: 2024-06-25 — End: ?

## 2024-06-25 MED ORDER — FINASTERIDE 5 MG TABLET
ORAL_TABLET | Freq: Every day | ORAL | 0 refills | 30.00000 days | Status: CP
Start: 2024-06-25 — End: 2024-07-25
  Filled 2024-06-29: qty 30, 30d supply, fill #0

## 2024-06-25 MED ORDER — BUPROPION HCL XL 150 MG 24 HR TABLET, EXTENDED RELEASE
ORAL_TABLET | Freq: Every day | ORAL | 0 refills | 30.00000 days | Status: CP
Start: 2024-06-25 — End: 2024-07-25
  Filled 2024-06-29: qty 30, 30d supply, fill #0

## 2024-06-25 MED ORDER — BUMETANIDE 2 MG TABLET
ORAL_TABLET | Freq: Two times a day (BID) | ORAL | 0 refills | 30.00000 days | Status: CP
Start: 2024-06-25 — End: 2024-07-25

## 2024-06-25 MED ORDER — LEVOTHYROXINE 75 MCG TABLET
ORAL_TABLET | Freq: Every day | ORAL | 0 refills | 30.00000 days | Status: CP
Start: 2024-06-25 — End: 2024-07-25

## 2024-06-25 MED ORDER — INSULIN SYRINGE U-100 WITH NEEDLE 1 ML 31 GAUGE X 5/16" (8 MM)
0 refills | 0.00000 days | Status: CP
Start: 2024-06-25 — End: 2024-06-25

## 2024-06-25 MED ORDER — LANCETS
0 refills | 0.00000 days | Status: CP
Start: 2024-06-25 — End: ?
  Filled 2024-06-29: qty 100, 25d supply, fill #0

## 2024-06-25 MED ORDER — MECLIZINE 12.5 MG TABLET
ORAL_TABLET | Freq: Three times a day (TID) | ORAL | 0 refills | 30.00000 days | Status: CP | PRN
Start: 2024-06-25 — End: 2024-09-23
  Filled 2024-06-29: qty 90, 30d supply, fill #0

## 2024-06-25 MED ORDER — INSULIN GLARGINE (U-100) 100 UNIT/ML (3 ML) SUBCUTANEOUS PEN
Freq: Every evening | SUBCUTANEOUS | 0 refills | 150.00000 days | Status: CP
Start: 2024-06-25 — End: 2024-11-22

## 2024-06-25 MED ORDER — BLOOD-GLUCOSE METER KIT WRAPPER
ORAL | 0 refills | 0.00000 days | Status: CP
Start: 2024-06-25 — End: ?
  Filled 2024-06-29: qty 1, 30d supply, fill #0

## 2024-06-25 MED ORDER — NYSTATIN 100,000 UNIT/GRAM TOPICAL POWDER
Freq: Four times a day (QID) | TOPICAL | 0 refills | 30.00000 days | Status: CP
Start: 2024-06-25 — End: 2024-07-25
  Filled 2024-06-29: qty 60, 30d supply, fill #0

## 2024-06-25 MED ORDER — CARVEDILOL 12.5 MG TABLET
ORAL_TABLET | Freq: Two times a day (BID) | ORAL | 0 refills | 30.00000 days | Status: CP
Start: 2024-06-25 — End: 2024-07-25
  Filled 2024-06-29: qty 120, 30d supply, fill #0

## 2024-06-25 MED ORDER — ASPIRIN 81 MG TABLET,DELAYED RELEASE
ORAL_TABLET | Freq: Every day | ORAL | 0 refills | 30.00000 days | Status: CP
Start: 2024-06-25 — End: 2024-07-25

## 2024-06-25 MED ORDER — BISACODYL 10 MG RECTAL SUPPOSITORY
Freq: Every day | RECTAL | 0 refills | 30.00000 days | Status: CP | PRN
Start: 2024-06-25 — End: 2024-09-23

## 2024-06-26 DIAGNOSIS — L732 Hidradenitis suppurativa: Principal | ICD-10-CM

## 2024-06-26 DIAGNOSIS — I1311 Hypertensive heart and chronic kidney disease without heart failure, with stage 5 chronic kidney disease, or end stage renal disease: Principal | ICD-10-CM

## 2024-06-26 DIAGNOSIS — L409 Psoriasis, unspecified: Principal | ICD-10-CM

## 2024-06-26 DIAGNOSIS — E8770 Fluid overload, unspecified: Principal | ICD-10-CM

## 2024-06-26 DIAGNOSIS — E1122 Type 2 diabetes mellitus with diabetic chronic kidney disease: Principal | ICD-10-CM

## 2024-06-26 DIAGNOSIS — I890 Lymphedema, not elsewhere classified: Principal | ICD-10-CM

## 2024-06-26 DIAGNOSIS — N185 Chronic kidney disease, stage 5: Principal | ICD-10-CM

## 2024-06-26 DIAGNOSIS — E039 Hypothyroidism, unspecified: Principal | ICD-10-CM

## 2024-06-26 MED ORDER — INSULIN GLARGINE (U-100) 100 UNIT/ML (3 ML) SUBCUTANEOUS PEN
Freq: Every evening | SUBCUTANEOUS | 0 refills | 187.00000 days | Status: CP
Start: 2024-06-26 — End: 2024-11-13
  Filled 2024-06-29: qty 15, 90d supply, fill #0

## 2024-06-26 MED ORDER — LEVOTHYROXINE 75 MCG TABLET
ORAL_TABLET | Freq: Every day | ORAL | 0 refills | 30.00000 days | Status: CP
Start: 2024-06-26 — End: 2024-07-26

## 2024-06-26 MED ORDER — CALCIUM ACETATE(PHOSPHATE BINDERS) 667 MG CAPSULE
ORAL_CAPSULE | Freq: Three times a day (TID) | ORAL | 0 refills | 30.00000 days | Status: CP
Start: 2024-06-26 — End: 2024-07-26
  Filled 2024-06-29: qty 90, 30d supply, fill #0

## 2024-06-26 MED ORDER — CHLORTHALIDONE 25 MG TABLET
ORAL_TABLET | Freq: Every day | ORAL | 0 refills | 30.00000 days | Status: CP
Start: 2024-06-26 — End: 2024-07-26

## 2024-06-27 MED ORDER — SENNOSIDES 8.6 MG TABLET
ORAL_TABLET | Freq: Every day | ORAL | 0 refills | 30.00000 days | Status: CP
Start: 2024-06-27 — End: 2024-07-27
  Filled 2024-06-29: qty 60, 30d supply, fill #0

## 2024-06-29 DIAGNOSIS — L409 Psoriasis, unspecified: Principal | ICD-10-CM

## 2024-06-29 DIAGNOSIS — E8770 Fluid overload, unspecified: Principal | ICD-10-CM

## 2024-06-29 DIAGNOSIS — N185 Chronic kidney disease, stage 5: Principal | ICD-10-CM

## 2024-06-29 DIAGNOSIS — E1122 Type 2 diabetes mellitus with diabetic chronic kidney disease: Principal | ICD-10-CM

## 2024-06-29 DIAGNOSIS — I890 Lymphedema, not elsewhere classified: Principal | ICD-10-CM

## 2024-06-29 DIAGNOSIS — E039 Hypothyroidism, unspecified: Principal | ICD-10-CM

## 2024-06-29 DIAGNOSIS — I1311 Hypertensive heart and chronic kidney disease without heart failure, with stage 5 chronic kidney disease, or end stage renal disease: Principal | ICD-10-CM

## 2024-06-29 DIAGNOSIS — L732 Hidradenitis suppurativa: Principal | ICD-10-CM

## 2024-06-29 MED ORDER — BUMETANIDE 2 MG TABLET
ORAL_TABLET | Freq: Every day | ORAL | 0 refills | 30.00000 days | Status: CP
Start: 2024-06-29 — End: 2024-07-29
  Filled 2024-06-29: qty 60, 30d supply, fill #0

## 2024-06-29 MED ORDER — LEVOFLOXACIN 250 MG TABLET
ORAL_TABLET | Freq: Every day | ORAL | 0 refills | 6.00000 days | Status: CP
Start: 2024-06-29 — End: 2024-07-05
  Filled 2024-06-29: qty 6, 6d supply, fill #0

## 2024-06-29 MED ORDER — OMEPRAZOLE 40 MG CAPSULE,DELAYED RELEASE
ORAL_CAPSULE | Freq: Every day | ORAL | 0 refills | 30.00000 days | Status: CP
Start: 2024-06-29 — End: 2024-07-29
  Filled 2024-06-29: qty 30, 30d supply, fill #0

## 2024-06-29 MED ORDER — ONDANSETRON HCL 8 MG TABLET
ORAL_TABLET | Freq: Three times a day (TID) | ORAL | 0 refills | 7.00000 days | Status: CP
Start: 2024-06-29 — End: 2024-07-06
  Filled 2024-06-29: qty 21, 7d supply, fill #0

## 2024-06-29 MED FILL — OXYCODONE 5 MG TABLET: ORAL | 5 days supply | Qty: 15 | Fill #0

## 2024-06-29 MED FILL — ASPIRIN 81 MG TABLET,DELAYED RELEASE: ORAL | 30 days supply | Qty: 30 | Fill #0

## 2024-06-29 MED FILL — BISACODYL 10 MG RECTAL SUPPOSITORY: RECTAL | 30 days supply | Qty: 30 | Fill #0

## 2024-07-06 DIAGNOSIS — L732 Hidradenitis suppurativa: Principal | ICD-10-CM

## 2024-07-08 ENCOUNTER — Ambulatory Visit: Admit: 2024-07-08 | Payer: Medicaid (Managed Care)

## 2024-07-14 ENCOUNTER — Encounter: Admit: 2024-07-14 | Discharge: 2024-07-15 | Payer: Medicaid (Managed Care)

## 2024-07-14 DIAGNOSIS — N185 Chronic kidney disease, stage 5: Principal | ICD-10-CM

## 2024-07-22 ENCOUNTER — Ambulatory Visit
Admit: 2024-07-22 | Discharge: 2024-07-23 | Payer: Medicaid (Managed Care) | Attending: Student in an Organized Health Care Education/Training Program | Primary: Student in an Organized Health Care Education/Training Program

## 2024-07-22 DIAGNOSIS — N189 Chronic kidney disease, unspecified: Principal | ICD-10-CM

## 2024-07-22 DIAGNOSIS — Z862 Personal history of diseases of the blood and blood-forming organs and certain disorders involving the immune mechanism: Principal | ICD-10-CM

## 2024-07-22 DIAGNOSIS — N185 Chronic kidney disease, stage 5: Principal | ICD-10-CM

## 2024-07-24 ENCOUNTER — Ambulatory Visit: Admit: 2024-07-24 | Discharge: 2024-07-24 | Payer: Medicaid (Managed Care)

## 2024-07-24 ENCOUNTER — Ambulatory Visit: Admit: 2024-07-24 | Payer: Medicaid (Managed Care)

## 2024-07-24 DIAGNOSIS — R339 Retention of urine, unspecified: Principal | ICD-10-CM

## 2024-07-24 DIAGNOSIS — N185 Chronic kidney disease, stage 5: Principal | ICD-10-CM

## 2024-07-27 ENCOUNTER — Ambulatory Visit: Admit: 2024-07-27 | Discharge: 2024-07-28 | Payer: Medicaid (Managed Care)

## 2024-07-27 DIAGNOSIS — E1165 Type 2 diabetes mellitus with hyperglycemia: Principal | ICD-10-CM

## 2024-07-27 DIAGNOSIS — N185 Chronic kidney disease, stage 5: Principal | ICD-10-CM

## 2024-07-27 DIAGNOSIS — Z794 Long term (current) use of insulin: Principal | ICD-10-CM

## 2024-07-27 MED ORDER — INSULIN LISPRO (U-100) 100 UNIT/ML SUBCUTANEOUS PEN
12 refills | 0.00000 days | Status: CP
Start: 2024-07-27 — End: ?

## 2024-07-27 MED ORDER — PEN NEEDLE, DIABETIC 32 GAUGE X 5/32" (4 MM)
ORAL | 3 refills | 0.00000 days | Status: CP
Start: 2024-07-27 — End: ?

## 2024-07-27 MED ORDER — INSULIN GLARGINE (U-100) 100 UNIT/ML (3 ML) SUBCUTANEOUS PEN
3 refills | 0.00000 days | Status: CP
Start: 2024-07-27 — End: ?

## 2024-08-07 DIAGNOSIS — N185 Chronic kidney disease, stage 5: Principal | ICD-10-CM

## 2024-08-07 DIAGNOSIS — Z862 Personal history of diseases of the blood and blood-forming organs and certain disorders involving the immune mechanism: Principal | ICD-10-CM

## 2024-08-07 DIAGNOSIS — N189 Chronic kidney disease, unspecified: Principal | ICD-10-CM

## 2024-08-08 DIAGNOSIS — L732 Hidradenitis suppurativa: Principal | ICD-10-CM

## 2024-08-10 ENCOUNTER — Ambulatory Visit
Admission: RE | Admit: 2024-08-10 | Discharge: 2024-08-13 | Disposition: A | Discharge status: Home / Self Care | Payer: Medicaid (Managed Care) | Admitting: Medical

## 2024-08-10 ENCOUNTER — Encounter
Admission: RE | Admit: 2024-08-10 | Discharge: 2024-08-13 | Disposition: A | Discharge status: Home / Self Care | Payer: Medicaid (Managed Care) | Attending: Student in an Organized Health Care Education/Training Program | Admitting: Medical | Primary: Student in an Organized Health Care Education/Training Program

## 2024-08-10 ENCOUNTER — Encounter
Admit: 2024-08-10 | Payer: Medicaid (Managed Care) | Attending: Student in an Organized Health Care Education/Training Program | Primary: Student in an Organized Health Care Education/Training Program

## 2024-08-10 ENCOUNTER — Inpatient Hospital Stay
Admission: RE | Admit: 2024-08-10 | Discharge: 2024-08-13 | Disposition: A | Discharge status: Home / Self Care | Payer: Medicaid (Managed Care) | Admitting: Medical

## 2024-08-10 MED ORDER — ATORVASTATIN 20 MG TABLET
ORAL_TABLET | Freq: Every day | ORAL | 0 refills | 30.00000 days | Status: CP
Start: 2024-08-10 — End: 2024-09-09

## 2024-08-10 MED ORDER — OXYCODONE 5 MG TABLET
ORAL_TABLET | Freq: Four times a day (QID) | ORAL | 0 refills | 3.00000 days | Status: CP | PRN
Start: 2024-08-10 — End: ?

## 2024-08-10 MED ORDER — ASPIRIN 81 MG TABLET,DELAYED RELEASE
ORAL_TABLET | Freq: Every day | ORAL | 0 refills | 30.00000 days | Status: CP
Start: 2024-08-10 — End: 2024-09-09

## 2024-08-13 MED ORDER — BUMETANIDE 2 MG TABLET
ORAL_TABLET | Freq: Two times a day (BID) | ORAL | 2 refills | 30.00000 days | Status: CP
Start: 2024-08-13 — End: 2024-11-11

## 2024-08-13 MED ORDER — MAGNESIUM OXIDE-MAGNESIUM AMINO ACID CHELATE 133 MG TABLET
ORAL_TABLET | Freq: Three times a day (TID) | ORAL | 2 refills | 30.00000 days | Status: CP
Start: 2024-08-13 — End: 2024-11-11

## 2024-08-17 ENCOUNTER — Encounter
Admit: 2024-08-17 | Discharge: 2024-08-18 | Payer: Medicaid (Managed Care) | Attending: Student in an Organized Health Care Education/Training Program | Primary: Student in an Organized Health Care Education/Training Program

## 2024-08-17 DIAGNOSIS — R0602 Shortness of breath: Principal | ICD-10-CM

## 2024-08-17 DIAGNOSIS — Z794 Long term (current) use of insulin: Principal | ICD-10-CM

## 2024-08-17 DIAGNOSIS — Z748 Other problems related to care provider dependency: Principal | ICD-10-CM

## 2024-08-17 DIAGNOSIS — E1165 Type 2 diabetes mellitus with hyperglycemia: Principal | ICD-10-CM

## 2024-08-17 DIAGNOSIS — Z741 Need for assistance with personal care: Principal | ICD-10-CM

## 2024-08-17 DIAGNOSIS — N185 Chronic kidney disease, stage 5: Principal | ICD-10-CM

## 2024-08-17 MED ORDER — OXYCODONE 5 MG TABLET
ORAL_TABLET | Freq: Four times a day (QID) | ORAL | 0 refills | 3.00000 days | Status: CP | PRN
Start: 2024-08-17 — End: ?

## 2024-08-17 MED ORDER — ASPIRIN 81 MG TABLET,DELAYED RELEASE
ORAL_TABLET | Freq: Every day | ORAL | 0 refills | 30.00000 days | Status: CP
Start: 2024-08-17 — End: 2024-09-16

## 2024-08-17 MED ORDER — BUMETANIDE 2 MG TABLET
ORAL_TABLET | Freq: Two times a day (BID) | ORAL | 2 refills | 30.00000 days | Status: CP
Start: 2024-08-17 — End: 2024-11-15

## 2024-08-17 MED ORDER — ATORVASTATIN 20 MG TABLET
ORAL_TABLET | Freq: Every day | ORAL | 0 refills | 30.00000 days | Status: CP
Start: 2024-08-17 — End: 2024-09-16

## 2024-08-17 MED ORDER — SEMAGLUTIDE 0.25 MG OR 0.5 MG (2 MG/3 ML) SUBCUTANEOUS PEN INJECTOR
SUBCUTANEOUS | 3 refills | 84.00000 days | Status: CP
Start: 2024-08-17 — End: ?

## 2024-08-17 MED ORDER — CALCIUM ACETATE(PHOSPHATE BINDERS) 667 MG CAPSULE
ORAL_CAPSULE | Freq: Three times a day (TID) | ORAL | 2 refills | 30.00000 days | Status: CP
Start: 2024-08-17 — End: 2024-11-15

## 2024-08-17 MED ORDER — SEMAGLUTIDE 0.25 MG OR 0.5 MG (2 MG/1.5 ML) SUBCUTANEOUS PEN INJECTOR
SUBCUTANEOUS | 0 refills | 28.00000 days | Status: CP
Start: 2024-08-17 — End: ?

## 2024-08-17 MED ORDER — CHOLECALCIFEROL (VITAMIN D3) 50 MCG (2,000 UNIT) TABLET
ORAL_TABLET | Freq: Every day | ORAL | 1 refills | 90.00000 days | Status: CP
Start: 2024-08-17 — End: 2025-08-17

## 2024-08-17 MED ORDER — GABAPENTIN 300 MG CAPSULE
ORAL_CAPSULE | Freq: Every evening | ORAL | 0 refills | 30.00000 days
Start: 2024-08-17 — End: 2024-09-16
